# Patient Record
Sex: Female | Born: 1950
Health system: Southern US, Community
[De-identification: ages and names within clinical notes are randomized; demographics above are authoritative.]

## PROBLEM LIST (undated history)

## (undated) DIAGNOSIS — Z5189 Encounter for other specified aftercare: Secondary | ICD-10-CM

## (undated) DIAGNOSIS — I10 Essential (primary) hypertension: Secondary | ICD-10-CM

## (undated) DIAGNOSIS — E785 Hyperlipidemia, unspecified: Secondary | ICD-10-CM

## (undated) DIAGNOSIS — K219 Gastro-esophageal reflux disease without esophagitis: Secondary | ICD-10-CM

## (undated) DIAGNOSIS — T7840XA Allergy, unspecified, initial encounter: Secondary | ICD-10-CM

## (undated) DIAGNOSIS — D649 Anemia, unspecified: Secondary | ICD-10-CM

## (undated) DIAGNOSIS — M8580 Other specified disorders of bone density and structure, unspecified site: Secondary | ICD-10-CM

## (undated) HISTORY — DX: Other specified disorders of bone density and structure, unspecified site: M85.80

## (undated) HISTORY — DX: Essential (primary) hypertension: I10

## (undated) HISTORY — DX: Gastro-esophageal reflux disease without esophagitis: K21.9

## (undated) HISTORY — DX: Encounter for other specified aftercare: Z51.89

## (undated) HISTORY — DX: Allergy, unspecified, initial encounter: T78.40XA

## (undated) HISTORY — DX: Anemia, unspecified: D64.9

## (undated) HISTORY — DX: Hyperlipidemia, unspecified: E78.5

---

## 1982-10-24 DIAGNOSIS — Z5189 Encounter for other specified aftercare: Secondary | ICD-10-CM

## 1982-10-24 HISTORY — DX: Encounter for other specified aftercare: Z51.89

## 1994-10-24 HISTORY — PX: PELVIC LAPAROSCOPY: SHX162

## 1998-05-07 ENCOUNTER — Ambulatory Visit (HOSPITAL_COMMUNITY): Admission: RE | Admit: 1998-05-07 | Discharge: 1998-05-07 | Payer: Self-pay | Admitting: *Deleted

## 1998-05-12 ENCOUNTER — Ambulatory Visit (HOSPITAL_COMMUNITY): Admission: RE | Admit: 1998-05-12 | Discharge: 1998-05-12 | Payer: Self-pay | Admitting: *Deleted

## 1998-06-05 ENCOUNTER — Ambulatory Visit (HOSPITAL_BASED_OUTPATIENT_CLINIC_OR_DEPARTMENT_OTHER): Admission: RE | Admit: 1998-06-05 | Discharge: 1998-06-05 | Payer: Self-pay | Admitting: General Surgery

## 1998-10-24 HISTORY — PX: HERNIA REPAIR: SHX51

## 1999-05-12 ENCOUNTER — Ambulatory Visit (HOSPITAL_COMMUNITY): Admission: RE | Admit: 1999-05-12 | Discharge: 1999-05-12 | Payer: Self-pay | Admitting: *Deleted

## 1999-05-12 ENCOUNTER — Encounter: Payer: Self-pay | Admitting: *Deleted

## 2000-06-09 ENCOUNTER — Encounter (INDEPENDENT_AMBULATORY_CARE_PROVIDER_SITE_OTHER): Payer: Self-pay | Admitting: Specialist

## 2000-06-09 ENCOUNTER — Other Ambulatory Visit: Admission: RE | Admit: 2000-06-09 | Discharge: 2000-06-09 | Payer: Self-pay | Admitting: *Deleted

## 2000-06-21 ENCOUNTER — Other Ambulatory Visit: Admission: RE | Admit: 2000-06-21 | Discharge: 2000-06-21 | Payer: Self-pay | Admitting: *Deleted

## 2001-06-18 ENCOUNTER — Other Ambulatory Visit: Admission: RE | Admit: 2001-06-18 | Discharge: 2001-06-18 | Payer: Self-pay | Admitting: *Deleted

## 2001-10-24 HISTORY — PX: SUPRACERVICAL ABDOMINAL HYSTERECTOMY: SHX5393

## 2002-10-24 HISTORY — PX: SUPRACERVICAL ABDOMINAL HYSTERECTOMY: SHX5393

## 2002-10-28 ENCOUNTER — Other Ambulatory Visit: Admission: RE | Admit: 2002-10-28 | Discharge: 2002-10-28 | Payer: Self-pay | Admitting: *Deleted

## 2002-11-19 ENCOUNTER — Encounter (INDEPENDENT_AMBULATORY_CARE_PROVIDER_SITE_OTHER): Payer: Self-pay

## 2002-11-19 ENCOUNTER — Observation Stay (HOSPITAL_COMMUNITY): Admission: RE | Admit: 2002-11-19 | Discharge: 2002-11-20 | Payer: Self-pay | Admitting: *Deleted

## 2004-12-29 ENCOUNTER — Ambulatory Visit: Payer: Self-pay | Admitting: Internal Medicine

## 2005-02-01 ENCOUNTER — Ambulatory Visit: Payer: Self-pay | Admitting: Internal Medicine

## 2005-04-27 ENCOUNTER — Ambulatory Visit: Payer: Self-pay | Admitting: Internal Medicine

## 2008-06-05 ENCOUNTER — Other Ambulatory Visit: Admission: RE | Admit: 2008-06-05 | Discharge: 2008-06-05 | Payer: Self-pay | Admitting: Obstetrics and Gynecology

## 2008-06-24 ENCOUNTER — Ambulatory Visit (HOSPITAL_COMMUNITY): Admission: RE | Admit: 2008-06-24 | Discharge: 2008-06-24 | Payer: Self-pay | Admitting: Obstetrics and Gynecology

## 2008-07-16 ENCOUNTER — Ambulatory Visit: Payer: Self-pay | Admitting: Internal Medicine

## 2008-07-16 DIAGNOSIS — M545 Low back pain, unspecified: Secondary | ICD-10-CM | POA: Insufficient documentation

## 2008-07-16 DIAGNOSIS — N924 Excessive bleeding in the premenopausal period: Secondary | ICD-10-CM | POA: Insufficient documentation

## 2008-07-16 DIAGNOSIS — E785 Hyperlipidemia, unspecified: Secondary | ICD-10-CM | POA: Insufficient documentation

## 2008-07-16 DIAGNOSIS — I10 Essential (primary) hypertension: Secondary | ICD-10-CM | POA: Insufficient documentation

## 2009-05-15 ENCOUNTER — Encounter: Payer: Self-pay | Admitting: Internal Medicine

## 2009-05-15 ENCOUNTER — Ambulatory Visit: Payer: Self-pay | Admitting: Internal Medicine

## 2009-05-15 DIAGNOSIS — L980 Pyogenic granuloma: Secondary | ICD-10-CM | POA: Insufficient documentation

## 2010-05-17 ENCOUNTER — Telehealth (INDEPENDENT_AMBULATORY_CARE_PROVIDER_SITE_OTHER): Payer: Self-pay | Admitting: *Deleted

## 2010-05-18 ENCOUNTER — Ambulatory Visit: Payer: Self-pay | Admitting: Internal Medicine

## 2010-05-18 DIAGNOSIS — M25519 Pain in unspecified shoulder: Secondary | ICD-10-CM | POA: Insufficient documentation

## 2010-05-18 DIAGNOSIS — R609 Edema, unspecified: Secondary | ICD-10-CM | POA: Insufficient documentation

## 2010-11-23 NOTE — Assessment & Plan Note (Signed)
Summary: per flag/stacey sched--ov--phone--stc   Vital Signs:  Patient profile:   60 year old female Height:      60 inches Weight:      165 pounds BMI:     32.34 O2 Sat:      96 % on Room air Temp:     99.3 degrees F oral Pulse rate:   81 / minute Pulse rhythm:   regular Resp:     16 per minute BP sitting:   118 / 80  (left arm) Cuff size:   regular  Vitals Entered By: Lanier Prude, CMA(AAMA) (May 18, 2010 1:56 PM)  O2 Flow:  Room air CC: f/u  c/o Lt shoulder pain X 1 yr Is Patient Diabetic? No Comments pt is not taking Hydrocodone-Acetamin.  Needs Rf on HCTZ 12.5 mg    CC:  f/u  c/o Lt shoulder pain X 1 yr.  History of Present Illness: C/o L shoulder pain x 6 months - no injury; better now F/u fluid retention x years  Current Medications (verified): 1)  Vitamin D3 1000 Unit  Tabs (Cholecalciferol) .Marland Kitchen.. 1 By Mouth Daily 2)  Hydrochlorothiazide 12.5 Mg  Tabs (Hydrochlorothiazide) .... Take 1 Tab  By Mouth Every Morning 3)  Fish Oil  Oil (Fish Oil) .... Two Times A Day 4)  Multivitamins  Tabs (Multiple Vitamin) .... Once Daily 5)  Hydrocodone-Acetaminophen 5-325 Mg Tabs (Hydrocodone-Acetaminophen) .Marland Kitchen.. 1 By Mouth Up To 4 Times Per Day As Needed For Pain  Allergies (verified): No Known Drug Allergies  Past History:  Past Medical History: Last updated: 07/16/2008 Hyperlipidemia GYN Dr Eda Paschal Low back pain, MSK  Past Surgical History: Last updated: 07/16/2008 Hysterectomy, partial 2004  Social History: Last updated: 07/16/2008 Occupation: Physicist, medical Married Never Smoked  Review of Systems  The patient denies fever, chest pain, and syncope.    Physical Exam  General:  WNL Mouth:  WNL Lungs:  Normal respiratory effort, chest expands symmetrically. Lungs are clear to auscultation, no crackles or wheezes. Heart:  Normal rate and regular rhythm. S1 and S2 normal without gallop, murmur, click, rub or other extra sounds. Msk:  L lat scapula is a  little tender to palp Skin:  Clear Psych:  Cognition and judgment appear intact. Alert and cooperative with normal attention span and concentration.    Impression & Recommendations:  Problem # 1:  SHOULDER PAIN (ICD-719.41) L Assessment Improved  The following medications were removed from the medication list:    Hydrocodone-acetaminophen 5-325 Mg Tabs (Hydrocodone-acetaminophen) .Marland Kitchen... 1 by mouth up to 4 times per day as needed for pain  Problem # 2:  EDEMA (ICD-782.3) Assessment: New  Her updated medication list for this problem includes:    Hydrochlorothiazide 12.5 Mg Tabs (Hydrochlorothiazide) .Marland Kitchen... Take 1 tab  by mouth every morning Labs w/gyn  Problem # 3:  Preventive Health Care (ICD-V70.0) Discussed need for colon  Complete Medication List: 1)  Vitamin D3 1000 Unit Tabs (Cholecalciferol) .Marland Kitchen.. 1 by mouth daily 2)  Hydrochlorothiazide 12.5 Mg Tabs (Hydrochlorothiazide) .... Take 1 tab  by mouth every morning 3)  Fish Oil Oil (Fish oil) .... Two times a day 4)  Multivitamins Tabs (Multiple vitamin) .... Once daily  Patient Instructions: 1)  Schedule well w/labs w/your GYN doctor Prescriptions: HYDROCHLOROTHIAZIDE 12.5 MG  TABS (HYDROCHLOROTHIAZIDE) Take 1 tab  by mouth every morning  #30 Capsule x 11   Entered and Authorized by:   Tresa Garter MD   Signed by:   Georgina Quint Christine Schiefelbein  MD on 05/18/2010   Method used:   Electronically to        [facility] [id number]* (retail)       [address]       [place], [place]  [address]       Ph: [phone]       Fax: [phone]   RxID:   [id number]

## 2010-11-23 NOTE — Progress Notes (Signed)
----   Converted from flag ---- ---- 05/17/2010 10:52 AM, Lanier Prude, CMA(AAMA) wrote: Please schedule pt for OV w/PCP.    Thanks!!  Misty Stanley ------------------------------  Gave pt appt/phone--05/18/10@ 145p w/ Dr Posey Rea.

## 2010-11-23 NOTE — Assessment & Plan Note (Signed)
Summary: RE ESTABLISH PER MD-$50-[employer]-PKG/OFF--STC   Vital Signs:  Patient Profile:   60 Years Old Female Weight:      170 pounds Temp:     97.5 degrees F oral Pulse rate:   82 / minute BP sitting:   154 / 106  (left arm)  Vitals Entered By: Tora Perches (July 16, 2008 9:38 AM)             Is Patient Diabetic? No     Chief Complaint:  to re-est..  History of Present Illness: Not seen >3 y. C/o elev. chol 236, HDL 44, LDL 158 in 8/09.    Current Allergies: No known allergies   Past Medical History:    Hyperlipidemia    GYN Dr Eda Paschal    Low back pain, MSK  Past Surgical History:    Hysterectomy, partial 2004   Family History:    M - cancer in neck    F CVA  Social History:    Occupation: Physicist, medical    Married    Never Smoked   Risk Factors:  Tobacco use:  never   Review of Systems       The patient complains of weight gain.  The patient denies anorexia, fever, weight loss, vision loss, decreased hearing, hoarseness, chest pain, syncope, dyspnea on exertion, peripheral edema, prolonged cough, headaches, hemoptysis, abdominal pain, melena, hematochezia, severe indigestion/heartburn, hematuria, incontinence, genital sores, muscle weakness, suspicious skin lesions, transient blindness, difficulty walking, depression, unusual weight change, abnormal bleeding, enlarged lymph nodes, angioedema, and breast masses.     Physical Exam  General:     overweight-appearing.   Head:     Normocephalic and atraumatic without obvious abnormalities. No apparent alopecia or balding. Eyes:     No corneal or conjunctival inflammation noted. EOMI. Perrla. Funduscopic exam benign, without hemorrhages, exudates or papilledema. Vision grossly normal. Ears:     External ear exam shows no significant lesions or deformities.  Otoscopic examination reveals clear canals, tympanic membranes are intact bilaterally without bulging, retraction, inflammation or discharge.  Hearing is grossly normal bilaterally. Nose:     External nasal examination shows no deformity or inflammation. Nasal mucosa are pink and moist without lesions or exudates. Mouth:     Oral mucosa and oropharynx without lesions or exudates.  Teeth in good repair. Neck:     No deformities, masses, or tenderness noted. Lungs:     Normal respiratory effort, chest expands symmetrically. Lungs are clear to auscultation, no crackles or wheezes. Heart:     Normal rate and regular rhythm. S1 and S2 normal without gallop, murmur, click, rub or other extra sounds. Abdomen:     Bowel sounds positive,abdomen soft and non-tender without masses, organomegaly or hernias noted. Msk:     No deformity or scoliosis noted of thoracic or lumbar spine.   Extremities:     No clubbing, cyanosis, edema, or deformity noted with normal full range of motion of all joints.   Neurologic:     No cranial nerve deficits noted. Station and gait are normal. Plantar reflexes are down-going bilaterally. DTRs are symmetrical throughout. Sensory, motor and coordinative functions appear intact. Skin:     Intact without suspicious lesions or rashes Psych:     Cognition and judgment appear intact. Alert and cooperative with normal attention span and concentration. No apparent delusions, illusions, hallucinations    Impression & Recommendations:  Problem # 1:  HYPERLIPIDEMIA (ICD-272.4) Assessment: New Does not to take a statin. Fish oil, wt loss.  Problem # 2:  ELEVATED BP (ICD-796.2) Assessment: New  Her updated medication list for this problem includes:    Hydrochlorothiazide 12.5 Mg Tabs (Hydrochlorothiazide) .Marland Kitchen... Take 1 tab  by mouth every morning   Problem # 3:  MENOPAUSAL SYNDROME (ICD-627.0) Assessment: Comment Only Vit D 1000 international units a day  Complete Medication List: 1)  Vitamin D3 1000 Unit Tabs (Cholecalciferol) .Marland Kitchen.. 1 by mouth daily 2)  Hydrochlorothiazide 12.5 Mg Tabs (Hydrochlorothiazide)  .... Take 1 tab  by mouth every morning  Other Orders: Admin 1st Vaccine (16109) Flu Vaccine 31yrs + (60454)   Patient Instructions: 1)  Please schedule a follow-up appointment in 6 months. 2)  BMP prior to visit, ICD-9: 3)  Hepatic Panel prior to visit, ICD-9: 272.0 4)  Lipid Panel prior to visit, ICD-9: 5)  TSH prior to visit, ICD-9:   Prescriptions: HYDROCHLOROTHIAZIDE 12.5 MG  TABS (HYDROCHLOROTHIAZIDE) Take 1 tab  by mouth every morning  #30 x 12   Entered and Authorized by:   Tresa Garter MD   Signed by:   Tresa Garter MD on 07/16/2008   Method used:   Print then Give to Patient   RxID:   [id number]  ]    Flu Vaccine Consent Questions     Do you have a history of severe allergic reactions to this vaccine? no    Any prior history of allergic reactions to egg and/or gelatin? no    Do you have a sensitivity to the preservative Thimersol? no    Do you have a past history of Guillan-Barre Syndrome? no    Do you currently have an acute febrile illness? no    Have you ever had a severe reaction to latex? no    Vaccine information given and explained to patient? yes    Are you currently pregnant? no    Lot Number:[id number]   Site Given  Left Deltoid IMflu

## 2010-11-23 NOTE — Assessment & Plan Note (Signed)
Summary: RT 2ND TOE LESION/OK PER PLOT/JSS   Vital Signs:  Patient profile:   60 year old female Height:      60 inches Weight:      160 pounds BMI:     31.36 Temp:     98.6 degrees F oral Pulse rate:   86 / minute BP sitting:   120 / 80  (left arm)  Vitals Entered By: Tora Perches (May 15, 2009 4:33 PM)  Procedure Note  Biopsy: Biopsy Type: Skin The patient complains of suspicious lesion and discharge. Indication: inflamed lesion Consent signed: yes  Procedure # 1: punch biopsy    Size (in cm): 0.5 x 0.5    Region: palmar    Location: R 2nd toe    Comment: Granulomatous lesion was removed after a tourniquet was applied. Wound treated w/hyfercator    Instrument used: 6mm punch    Anesthesia: 1% lidocaine w/o epinephrine  Cleaned and prepped with: alcohol and betadine Wound dressing: bacitracin, bandaid, bulky gauze dressing, and pressure dressing Instructions: daily dressing changes  CC: problems with toe/vg Is Patient Diabetic? No   CC:  problems with toe/vg.  History of Present Illness: C/o R 2nd toe bleeding lesion x 1-2 wks worse today. Walked in crying  Allergies (verified): No Known Drug Allergies  Past History:  Past Medical History: Last updated: 07/16/2008 Hyperlipidemia GYN Dr Eda Paschal Low back pain, MSK  Past Surgical History: Last updated: 07/16/2008 Hysterectomy, partial 2004  Physical Exam  General:  Anxious  Skin:  R second toe frail and fleshy bleeding 5 mm lesion   Impression & Recommendations:  Problem # 1:  GRANULOMA, INFECTIOUS (ICD-686.1) Assessment New  Removed  Orders: Shave Skin Lesion <0.5cm Scalp/neck/hands/feet/genitalia (47829)  Complete Medication List: 1)  Vitamin D3 1000 Unit Tabs (Cholecalciferol) .Marland Kitchen.. 1 by mouth daily 2)  Hydrochlorothiazide 12.5 Mg Tabs (Hydrochlorothiazide) .... Take 1 tab  by mouth every morning 3)  Fish Oil Oil (Fish oil) .... Two times a day 4)  Multivitamins Tabs (Multiple  vitamin) .... Once daily 5)  Hydrocodone-acetaminophen 5-325 Mg Tabs (Hydrocodone-acetaminophen) .Marland Kitchen.. 1 by mouth up to 4 times per day as needed for pain  Patient Instructions: 1)  Call if you are not better in a reasonable amount of time or if worse. Go to ER if feeling really bad! 2)  Change dressing daily Prescriptions: HYDROCODONE-ACETAMINOPHEN 5-325 MG TABS (HYDROCODONE-ACETAMINOPHEN) 1 by mouth up to 4 times per day as needed for pain  #20 x 0   Entered and Authorized by:   Tresa Garter MD   Signed by:   Tresa Garter MD on 05/15/2009   Method used:   Print then Give to Patient   RxID:   [id number]

## 2010-11-23 NOTE — Miscellaneous (Signed)
Summary: 2nd RT Toe/[facility]  2nd RT Toe/[facility]   Imported By: Sherian Rein 05/19/2009 08:07:27  _____________________________________________________________________  External Attachment:    Type:   Image     Comment:   External Document

## 2011-03-11 NOTE — Op Note (Signed)
Regina Roberts, Regina Roberts                      ACCOUNT NO.:  [id number]   MEDICAL RECORD NO.:  [id number]                   PATIENT TYPE:  OBV   LOCATION:  0445                                 FACILITY:  [facility]   PHYSICIAN:  Almedia Balls. Fore, M.D.                DATE OF BIRTH:  03/17/1951   DATE OF PROCEDURE:  11/19/2002  DATE OF DISCHARGE:                                 OPERATIVE REPORT   PREOPERATIVE DIAGNOSES:  1. Abnormal uterine bleeding.  2. Uterine enlargement.  3. Pelvic pain.   POSTOPERATIVE DIAGNOSES:  1. Abnormal uterine bleeding.  2. Uterine enlargement.  3. Pelvic pain.  4. Right ovarian cyst.   Pending pathology.   PROCEDURE:  Abdominal supracervical hysterectomy, right salpingo-  oophorectomy.   ANESTHESIA:  General orotracheal.   SURGEON:  Almedia Balls. Randell Patient, M.D.   ASSISTANT:  Leona Singleton, M.D.   INDICATION FOR PROCEDURE:  The patient is a 60 year old with the above-noted  problems who has been counseled as to the need for surgery to treat these  problems.  She has been fully counseled as to the nature of the procedure  and the risks involved, including risks of anesthesia, injury to bowel,  bladder, blood vessels, ureters, postoperative hemorrhage, infection,  recuperation, possibility of hormone replacement should her ovaries be  removed.  She fully understands all these considerations and wishes to  proceed on 11/19/02.   OPERATIVE FINDINGS:  On entry into the abdomen, the uterus was enlarged to  approximately 16-18 weeks' gestational size.  It was incarcerated in the  greater curvature of the pelvis so that elevation of the uterus was quite  difficulty.  The right ovary had a cyst, which was hemorrhagic.  The left  ovary appeared to be normal.  Upper abdominal viscera were normal to  palpation.   DESCRIPTION OF PROCEDURE:  With the patient under general anesthesia,  prepared and draped in the usual sterile fashion with a Foley catheter in  the  bladder, a lower abdominal transverse incision was made and carried into  the peritoneal cavity without difficulty.  A self-retaining retractor was  placed, and the bowel was packed off.  Elevation of the uterus was difficult  because of the incarcerated nature of the uterus into the posterior cul-de-  sac and greater curvature of the pelvis.  However, this was accomplished so  that Kelly clamps could be placed across the uterine-ovarian anastomoses,  tubes, and round ligaments bilaterally for traction and hemostasis.  The  round ligaments were transected using Bovie electrocoagulation, and the  retroperitoneal space was entered as well as the bladder flap anteriorly was  developed.  Because of the cystic nature of the right ovary and the  inability to control hemorrhage in this area, the infundibulopelvic ligament  was clamped on the right with removal of the right tube and ovary.  This  structure was then cut and doubly ligated with  1 chromic catgut.  On the  left the left ovary was conserved by clamping proximal to the ovary and  cutting down with the uterine-ovarian anastomosis being doubly tied with 1  chromic catgut.  The bladder was further advanced along the lower uterine  segment and cervix, and the uterine vessels were skeletonized.  Heaney  clamps were used to clamp the uterine vessels bilaterally, which were then  cut and suture ligated with 1 chromic catgut.  Heaney clamps were also used  to clamp the remaining portions of the cardinal ligaments bilaterally, which  were likewise cut and suture ligated with 1 chromic catgut.  The uterine  fundus was excised using bipolar electrocoagulation with the endocervical  stump being reversely coned so that the endocervix was removed for the most  part.  Small bleeders on the cervix were rendered hemostatic with Bovie  electrocoagulation, and the cervix was reapproximated and rendered  hemostatic with interrupted figure-of-eight sutures of  #1 chromic catgut.  The area was lavaged with copious amounts of lactated Ringer's solution and  after noting that hemostasis was maintained and that sponge and instrument  counts were correct, the peritoneum was closed with a continuous suture of 0  Vicryl.  The fascia was closed with two sutures of 0 Vicryl, which were  brought from the lateral aspects of the incision and tied separately in the  midline.  Subcutaneous fat was reapproximated with interrupted sutures of 0  Vicryl.  Skin was closed with a subcuticular suture of 3-0 plain catgut.  Estimated blood loss 300 mL.  The patient was taken to the recovery room in  good condition with clear urine in the Foley catheter tubing.  She will be  placed on 23-hour observation following surgery.                                                Almedia Balls. Randell Patient, M.D.    SRF/MEDQ  D:  11/19/2002  T:  11/19/2002  Job:  [id number]   cc:   Leona Singleton, M.D.  [address]  [place]  [place] [address]  Fax: [phone]

## 2011-03-11 NOTE — Discharge Summary (Signed)
   NAMEEVOLEHT, HOVATTER                      ACCOUNT NO.:  [id number]   MEDICAL RECORD NO.:  [id number]                   PATIENT TYPE:  OBV   LOCATION:  0445                                 FACILITY:  [facility]   PHYSICIAN:  Almedia Balls. Fore, M.D.                DATE OF BIRTH:  1951-01-08   DATE OF ADMISSION:  11/19/2002  DATE OF DISCHARGE:  11/20/2002                                 DISCHARGE SUMMARY   HISTORY:  The patient is a 60 year old with uterine enlargement, abnormal  uterine bleeding, pelvic pain, for abdominal hysterectomy and possible  bilateral salpingo-oophorectomy.  The remainder of her history and physical  are as previously dictated.   LABORATORY DATA:  Preoperative hemoglobin 12.5, electrocardiogram showed  normal sinus rhythm with left ventricular hypertrophy and right bundle  branch block.   HOSPITAL COURSE:  The patient was taken to the operating room on 11/19/02, at  which time abdominal and supracervical hysterectomy and right salpingo-  oophorectomy were performed.  The patient did well postoperatively.  Diet  and ambulation progressed over the evening of 11/19/02 and the early morning  of 11/20/02.  On the morning of 11/20/02, she was afebrile and experiencing no  problems, except for pain, which was controlled by analgesics.  It was felt  that she could be discharged at this time.   FINAL DIAGNOSES:  1. Uterine enlargement.  2. Abnormal uterine bleeding.  3. History of anemia secondary to above.   OPERATIONS:  Abdominal supracervical hysterectomy and right salpingo-  oophorectomy.  Pathology report unavailable at the time of dictation.   DISPOSITION:  Discharged to home to return to the office in two weeks for  followup.  She was instructed to gradually progress her activities over  several weeks at home, and to limit lifting and driving for two weeks.  She  was fully ambulatory, on a regular diet, and in good discharge at the time  of discharge.  She was  given prescriptions for Dilaudid 2 mg #30 to be taken  1-2 q.4h. p.r.n. pain, and Doxycycline 100 mg #12 to be taken 1 b.i.d.                                                Almedia Balls. Randell Patient, M.D.    SRF/MEDQ  D:  11/20/2002  T:  11/20/2002  Job:  [id number]

## 2011-03-11 NOTE — H&P (Signed)
NAME:  Regina Roberts, Regina Roberts                      ACCOUNT NO.:  [id number]   MEDICAL RECORD NO.:  [id number]                   PATIENT TYPE:   LOCATION:                                       FACILITY:  [name]   PHYSICIAN:  Almedia Balls. Fore, M.D.                DATE OF BIRTH:  14-Nov-1950   DATE OF ADMISSION:  11/19/2002  DATE OF DISCHARGE:                                HISTORY & PHYSICAL   For admission November 19, 2002.   CHIEF COMPLAINT:  Uterine enlargements, abnormal bleeding.   HISTORY OF PRESENT ILLNESS:  The patient is a 60 year old whose last  menstrual period was October 28, 2002.  She has been followed in our office  over several years with gradual increase in uterine size to approximately [redacted]  weeks gestational size.  She has had increasingly heavy menses and has had  hemoglobins as low as approximately 10 grams.  She underwent ultrasound and  endometrial biopsy in October 25, 2002, and this was read as totally benign  endometrium.  Hysteroscopy D&C done several years ago revealed several  probably submucous myomata and the uterus at approximately [redacted] weeks  gestational size.  On an exam on October 28, 2002, the uterus had enlarged to  approximately [redacted] weeks gestational size with marked irregularity.  She is  admitted at this time for transabdominal hysterectomy, possible bilateral  salpingo-oophorectomy, and indicated procedures.  She has been fully  counseled as to the nature of the procedure and the risks involved including  risks of anesthesia; injury to bowel, bladder, blood vessels, ureters;  postoperative hemorrhage; infection; recuperation; the possibility of  hormone replacement should her ovaries be removed.  She fully understands  all these considerations and wishes to proceed on November 19, 2002.   PAST MEDICAL HISTORY:  Includes the above-noted D&C and endometrial biopsy,  a laparoscopy done in 1996 with finding of benign ovarian cyst and probable  endometriosis with  again the uterus somewhat enlarged at that time.   ALLERGIES:  She is allergic to no medications.   MEDICATIONS:  Has taken only iron and vitamins at this point and analgesics  for pain with her menses.   REVIEW OF SYSTEMS:  HEENT:  Negative.  CARDIORESPIRATORY:  Negative.  GASTROINTESTINAL:  Negative.  GENITOURINARY:  As in Present Illness.   FAMILY HISTORY:  Includes a maternal aunt with cancer of the breast.   PHYSICAL EXAMINATION:  VITAL SIGNS:  Height 5 feet 1/2 inch.  Weight 172  pounds.  Blood pressure 124/80, pulse 72, respirations 18.  GENERAL:  A well-developed black female in no acute distress.  HEENT:  Within normal limits.  NECK:  Supple without masses, adenopathy, or bruit.  HEART:  Regular rate and rhythm without murmurs.  LUNGS:  Clear to P&A.  BREASTS:  Sitting and lying without mass.  Axilla negative.  ABDOMEN:  Flat and soft with a mass effect in the lower  abdomen, nontender.  PELVIC:  External genitalia, Bartholin, urethral, and Skene's glands within  normal limits.  Vagina is clean, cervix somewhat inflamed.  Uterus is  midposterior, approximately 10-[redacted] weeks gestational size.  It is not  possible to palpate adnexal structures but they appear to be normal on  ultrasound.  Anterior and posterior cul-de-sac exam was confirmatory.  EXTREMITIES:  Within normal limits.  CENTRAL NERVOUS SYSTEM:  Grossly intact.  SKIN:  Without suspicious lesion.   IMPRESSION:  1. Probable leiomyomata uteri.  2. Abnormal bleeding secondary to above.  3. Anemia secondary to above.   DISPOSITION:  As noted above.   LABORATORY DATA:  Of note is that a Pap smear was normal on October 28, 2002.                                               Almedia Balls. Randell Patient, M.D.    SRF/MEDQ  D:  11/14/2002  T:  11/14/2002  Job:  [id number]

## 2011-06-11 ENCOUNTER — Other Ambulatory Visit: Payer: Self-pay | Admitting: Internal Medicine

## 2011-09-09 ENCOUNTER — Other Ambulatory Visit: Payer: Self-pay | Admitting: Internal Medicine

## 2011-09-12 MED ORDER — HYDROCHLOROTHIAZIDE 12.5 MG PO CAPS: 12.5000 mg | ORAL_CAPSULE | ORAL | Status: DC

## 2011-09-12 NOTE — Telephone Encounter (Signed)
Addended by: Anselm Jungling on: 09/12/2011 10:54 AM   Modules accepted: Orders

## 2011-10-16 ENCOUNTER — Other Ambulatory Visit: Payer: Self-pay | Admitting: Internal Medicine

## 2011-11-17 ENCOUNTER — Other Ambulatory Visit: Payer: Self-pay | Admitting: Internal Medicine

## 2012-01-13 ENCOUNTER — Other Ambulatory Visit: Payer: Self-pay | Admitting: Internal Medicine

## 2012-03-10 ENCOUNTER — Other Ambulatory Visit: Payer: Self-pay | Admitting: Internal Medicine

## 2012-03-16 ENCOUNTER — Other Ambulatory Visit: Payer: Self-pay | Admitting: Internal Medicine

## 2012-05-07 ENCOUNTER — Other Ambulatory Visit: Payer: Self-pay | Admitting: Internal Medicine

## 2012-05-14 ENCOUNTER — Telehealth: Payer: Self-pay | Admitting: *Deleted

## 2012-05-14 NOTE — Telephone Encounter (Signed)
Message copied by Merrilyn Puma on Mon May 14, 2012  9:29 AM ------      Message from: Etheleen Sia      Created: Tue May 08, 2012  9:50 AM       I set her up on 8-14 for a physical.  She wants to do labs after seeing him.  No insurance/ bring $125.  Told her she would get refills at the appt.      ----- Message -----         From: Merrilyn Puma, CMA         Sent: 05/08/2012   7:55 AM           To: Remigio Eisenmenger, #            Please contact pt- we keep getting Rf requests and it looks like she hasn't been see in years.             Thanks!!

## 2012-06-06 ENCOUNTER — Encounter: Payer: Self-pay | Admitting: Internal Medicine

## 2014-06-02 ENCOUNTER — Telehealth: Payer: Self-pay | Admitting: Internal Medicine

## 2014-06-02 ENCOUNTER — Ambulatory Visit (INDEPENDENT_AMBULATORY_CARE_PROVIDER_SITE_OTHER): Payer: BC Managed Care – PPO | Admitting: Internal Medicine

## 2014-06-02 ENCOUNTER — Encounter: Payer: Self-pay | Admitting: Internal Medicine

## 2014-06-02 VITALS — BP 160/100 | HR 84 | Temp 98.2°F | Resp 20 | Wt 161.0 lb

## 2014-06-02 DIAGNOSIS — Z0000 Encounter for general adult medical examination without abnormal findings: Secondary | ICD-10-CM

## 2014-06-02 DIAGNOSIS — E785 Hyperlipidemia, unspecified: Secondary | ICD-10-CM

## 2014-06-02 DIAGNOSIS — R030 Elevated blood-pressure reading, without diagnosis of hypertension: Secondary | ICD-10-CM

## 2014-06-02 LAB — CBC WITH DIFFERENTIAL/PLATELET
Basophils Absolute: 0 10*3/uL (ref 0.0–0.1)
Basophils Relative: 0.4 % (ref 0.0–3.0)
Eosinophils Absolute: 0.1 10*3/uL (ref 0.0–0.7)
Eosinophils Relative: 0.6 % (ref 0.0–5.0)
HCT: 41.5 % (ref 36.0–46.0)
Hemoglobin: 13.8 g/dL (ref 12.0–15.0)
Lymphocytes Relative: 23.7 % (ref 12.0–46.0)
Lymphs Abs: 3.1 10*3/uL (ref 0.7–4.0)
MCHC: 33.2 g/dL (ref 30.0–36.0)
MCV: 88.5 fl (ref 78.0–100.0)
Monocytes Absolute: 1 10*3/uL (ref 0.1–1.0)
Monocytes Relative: 7.6 % (ref 3.0–12.0)
Neutro Abs: 8.9 10*3/uL — ABNORMAL HIGH (ref 1.4–7.7)
Neutrophils Relative %: 67.7 % (ref 43.0–77.0)
Platelets: 347 10*3/uL (ref 150.0–400.0)
RBC: 4.69 Mil/uL (ref 3.87–5.11)
RDW: 14.2 % (ref 11.5–15.5)
WBC: 13.1 10*3/uL — ABNORMAL HIGH (ref 4.0–10.5)

## 2014-06-02 LAB — COMPREHENSIVE METABOLIC PANEL
ALT: 15 U/L (ref 0–35)
AST: 14 U/L (ref 0–37)
Albumin: 4.1 g/dL (ref 3.5–5.2)
Alkaline Phosphatase: 56 U/L (ref 39–117)
BUN: 14 mg/dL (ref 6–23)
CO2: 28 mEq/L (ref 19–32)
Calcium: 10.2 mg/dL (ref 8.4–10.5)
Chloride: 105 mEq/L (ref 96–112)
Creatinine, Ser: 0.8 mg/dL (ref 0.4–1.2)
GFR: 89.51 mL/min (ref >60.00)
Glucose, Bld: 86 mg/dL (ref 70–99)
Potassium: 3.8 mEq/L (ref 3.5–5.1)
Sodium: 139 mEq/L (ref 135–145)
Total Bilirubin: 0.6 mg/dL (ref 0.2–1.2)
Total Protein: 7.7 g/dL (ref 6.0–8.3)

## 2014-06-02 LAB — LIPID PANEL
Cholesterol: 257 mg/dL — ABNORMAL HIGH (ref 0–200)
HDL: 42.9 mg/dL (ref >39.00)
LDL Cholesterol: 182 mg/dL — ABNORMAL HIGH (ref 0–99)
NonHDL: 214.1
Total CHOL/HDL Ratio: 6
Triglycerides: 161 mg/dL — ABNORMAL HIGH (ref 0.0–149.0)
VLDL: 32.2 mg/dL (ref 0.0–40.0)

## 2014-06-02 LAB — TSH: TSH: 0.44 u[IU]/mL (ref 0.35–4.50)

## 2014-06-02 MED ORDER — LISINOPRIL-HYDROCHLOROTHIAZIDE 20-25 MG PO TABS: 1.0000 | ORAL_TABLET | Freq: Every day | ORAL | Status: DC

## 2014-06-02 NOTE — Progress Notes (Signed)
Subjective:    Patient ID: Regina Roberts, female    DOB: 08/24/1951, 63 y.o.   MRN: [id number]  HPI  BP Readings from Last 3 Encounters:  06/02/14 160/100  05/18/10 118/80  05/15/09 61/75   63 year old patient who has remote history of hypertension that has been controlled off medications for some time.  She has not seen her primary care provider and a number of years.  Formerly she has been controlled on diuretic therapy. 3 days ago.  She checked her blood pressure at a local drugstore with a reading of 196 over 116.  She does monitor blood pressure readings occasionally with normal tensive readings.  She is asymptomatic.  Denies any headache, visual disturbances, chest pain.  History reviewed. No pertinent past medical history.  History   Social History  . Marital Status: Married    Spouse Name: N/A    Number of Children: N/A  . Years of Education: N/A   Occupational History  . Not on file.   Social History Main Topics  . Smoking status: Never Smoker   . Smokeless tobacco: Never Used  . Alcohol Use: No  . Drug Use: No  . Sexual Activity: Not on file   Other Topics Concern  . Not on file   Social History Narrative  . No narrative on file    History reviewed. No pertinent past surgical history.  No family history on file.  No Known Allergies  No current outpatient prescriptions on file prior to visit.   No current facility-administered medications on file prior to visit.    BP 160/100  Pulse 84  Temp(Src) 98.2 F (36.8 C) (Oral)  Resp 20  Wt 161 lb (73.029 kg)  SpO2 97%    Review of Systems  Constitutional: Negative.   HENT: Negative for congestion, dental problem, hearing loss, rhinorrhea, sinus pressure, sore throat and tinnitus.   Eyes: Negative for pain, discharge and visual disturbance.  Respiratory: Negative for cough and shortness of breath.   Cardiovascular: Negative for chest pain, palpitations and leg swelling.  Gastrointestinal:  Negative for nausea, vomiting, abdominal pain, diarrhea, constipation, blood in stool and abdominal distention.  Genitourinary: Negative for dysuria, urgency, frequency, hematuria, flank pain, vaginal bleeding, vaginal discharge, difficulty urinating, vaginal pain and pelvic pain.  Musculoskeletal: Negative for arthralgias, gait problem and joint swelling.  Skin: Negative for rash.  Neurological: Negative for dizziness, syncope, speech difficulty, weakness, numbness and headaches.  Hematological: Negative for adenopathy.  Psychiatric/Behavioral: Negative for behavioral problems, dysphoric mood and agitation. The patient is not nervous/anxious.        Objective:   Physical Exam  Constitutional: She is oriented to person, place, and time. She appears well-developed and well-nourished.  Blood pressure 180 over 112  HENT:  Head: Normocephalic.  Right Ear: External ear normal.  Left Ear: External ear normal.  Mouth/Throat: Oropharynx is clear and moist.  Eyes: Conjunctivae and EOM are normal. Pupils are equal, round, and reactive to light.  Neck: Normal range of motion. Neck supple. No thyromegaly present.  Cardiovascular: Normal rate, regular rhythm, normal heart sounds and intact distal pulses.   Pulmonary/Chest: Effort normal and breath sounds normal.  Abdominal: Soft. Bowel sounds are normal. She exhibits no mass. There is no tenderness.  Musculoskeletal: Normal range of motion.  Lymphadenopathy:    She has no cervical adenopathy.  Neurological: She is alert and oriented to person, place, and time.  Skin: Skin is warm and dry. No rash noted.  Psychiatric: She  has a normal mood and affect. Her behavior is normal.          Assessment & Plan:   Hypertension.  Stage II.  We'll start on combination therapy We'll place on low salt diet  Recheck 2 weeks  History of dyslipidemia Schedule CPX  Check screening lab

## 2014-06-02 NOTE — Progress Notes (Signed)
Pre visit review using our clinic review tool, if applicable. No additional management support is needed unless otherwise documented below in the visit note. 

## 2014-06-02 NOTE — Patient Instructions (Signed)
Limit your sodium (Salt) intake  DASH Eating Plan DASH stands for "Dietary Approaches to Stop Hypertension." The DASH eating plan is a healthy eating plan that has been shown to reduce high blood pressure (hypertension). Additional health benefits may include reducing the risk of type 2 diabetes mellitus, heart disease, and stroke. The DASH eating plan may also help with weight loss. WHAT DO I NEED TO KNOW ABOUT THE DASH EATING PLAN? For the DASH eating plan, you will follow these general guidelines:  Choose foods with a percent daily value for sodium of less than 5% (as listed on the food label).  Use salt-free seasonings or herbs instead of table salt or sea salt.  Check with your health care provider or pharmacist before using salt substitutes.  Eat lower-sodium products, often labeled as "lower sodium" or "no salt added."  Eat fresh foods.  Eat more vegetables, fruits, and low-fat dairy products.  Choose whole grains. Look for the word "whole" as the first word in the ingredient list.  Choose fish and skinless chicken or [place] more often than red meat. Limit fish, poultry, and meat to 6 oz (170 g) each day.  Limit sweets, desserts, sugars, and sugary drinks.  Choose heart-healthy fats.  Limit cheese to 1 oz (28 g) per day.  Eat more home-cooked food and less restaurant, buffet, and fast food.  Limit fried foods.  Cook foods using methods other than frying.  Limit canned vegetables. If you do use them, rinse them well to decrease the sodium.  When eating at a restaurant, ask that your food be prepared with less salt, or no salt if possible. WHAT FOODS CAN I EAT? Seek help from a dietitian for individual calorie needs. Grains Whole grain or whole wheat bread. Brown rice. Whole grain or whole wheat pasta. Quinoa, bulgur, and whole grain cereals. Low-sodium cereals. Corn or whole wheat flour tortillas. Whole grain cornbread. Whole grain crackers. Low-sodium  crackers. Vegetables Fresh or frozen vegetables (raw, steamed, roasted, or grilled). Low-sodium or reduced-sodium tomato and vegetable juices. Low-sodium or reduced-sodium tomato sauce and paste. Low-sodium or reduced-sodium canned vegetables.  Fruits All fresh, canned (in natural juice), or frozen fruits. Meat and Other Protein Products Ground beef (85% or leaner), grass-fed beef, or beef trimmed of fat. Skinless chicken or [place]. Ground chicken or [place]. Pork trimmed of fat. All fish and seafood. Eggs. Dried beans, peas, or lentils. Unsalted nuts and seeds. Unsalted canned beans. Dairy Low-fat dairy products, such as skim or 1% milk, 2% or reduced-fat cheeses, low-fat ricotta or cottage cheese, or plain low-fat yogurt. Low-sodium or reduced-sodium cheeses. Fats and Oils Tub margarines without trans fats. Light or reduced-fat mayonnaise and salad dressings (reduced sodium). Avocado. Safflower, olive, or canola oils. Natural peanut or almond butter. Other Unsalted popcorn and pretzels. The items listed above may not be a complete list of recommended foods or beverages. Contact your dietitian for more options. WHAT FOODS ARE NOT RECOMMENDED? Grains White bread. White pasta. White rice. Refined cornbread. Bagels and croissants. Crackers that contain trans fat. Vegetables Creamed or fried vegetables. Vegetables in a cheese sauce. Regular canned vegetables. Regular canned tomato sauce and paste. Regular tomato and vegetable juices. Fruits Dried fruits. Canned fruit in light or heavy syrup. Fruit juice. Meat and Other Protein Products Fatty cuts of meat. Ribs, chicken wings, bacon, sausage, bologna, salami, chitterlings, fatback, hot dogs, bratwurst, and packaged luncheon meats. Salted nuts and seeds. Canned beans with salt. Dairy Whole or 2% milk, cream, half-and-half, and cream  cheese. Whole-fat or sweetened yogurt. Full-fat cheeses or blue cheese. Nondairy creamers and whipped toppings.  Processed cheese, cheese spreads, or cheese curds. Condiments Onion and garlic salt, seasoned salt, table salt, and sea salt. Canned and packaged gravies. Worcestershire sauce. Tartar sauce. Barbecue sauce. Teriyaki sauce. Soy sauce, including reduced sodium. Steak sauce. Fish sauce. Oyster sauce. Cocktail sauce. Horseradish. Ketchup and mustard. Meat flavorings and tenderizers. Bouillon cubes. Hot sauce. Tabasco sauce. Marinades. Taco seasonings. Relishes. Fats and Oils Butter, stick margarine, lard, shortening, ghee, and bacon fat. Coconut, palm kernel, or palm oils. Regular salad dressings. Other Pickles and olives. Salted popcorn and pretzels. The items listed above may not be a complete list of foods and beverages to avoid. Contact your dietitian for more information. WHERE CAN I FIND MORE INFORMATION? National Heart, Lung, and Blood Institute: travelstabloid.com Document Released: 09/29/2011 Document Revised: 02/24/2014 Document Reviewed: 08/14/2013 [facility] Patient Information 2015 [place], [place]. This information is not intended to replace advice given to you by your health care provider. Make sure you discuss any questions you have with your health care provider. Hypertension Hypertension, commonly called high blood pressure, is when the force of blood pumping through your arteries is too strong. Your arteries are the blood vessels that carry blood from your heart throughout your body. A blood pressure reading consists of a higher number over a lower number, such as 110/72. The higher number (systolic) is the pressure inside your arteries when your heart pumps. The lower number (diastolic) is the pressure inside your arteries when your heart relaxes. Ideally you want your blood pressure below 120/80. Hypertension forces your heart to work harder to pump blood. Your arteries may become narrow or stiff. Having hypertension puts you at risk for heart disease,  stroke, and other problems.  RISK FACTORS Some risk factors for high blood pressure are controllable. Others are not.  Risk factors you cannot control include:   Race. You may be at higher risk if you are African American.  Age. Risk increases with age.  Gender. Men are at higher risk than women before age 46 years. After age 48, women are at higher risk than men. Risk factors you can control include:  Not getting enough exercise or physical activity.  Being overweight.  Getting too much fat, sugar, calories, or salt in your diet.  Drinking too much alcohol. SIGNS AND SYMPTOMS Hypertension does not usually cause signs or symptoms. Extremely high blood pressure (hypertensive crisis) may cause headache, anxiety, shortness of breath, and nosebleed. DIAGNOSIS  To check if you have hypertension, your health care provider will measure your blood pressure while you are seated, with your arm held at the level of your heart. It should be measured at least twice using the same arm. Certain conditions can cause a difference in blood pressure between your right and left arms. A blood pressure reading that is higher than normal on one occasion does not mean that you need treatment. If one blood pressure reading is high, ask your health care provider about having it checked again. TREATMENT  Treating high blood pressure includes making lifestyle changes and possibly taking medicine. Living a healthy lifestyle can help lower high blood pressure. You may need to change some of your habits. Lifestyle changes may include:  Following the DASH diet. This diet is high in fruits, vegetables, and whole grains. It is low in salt, red meat, and added sugars.  Getting at least 2 hours of brisk physical activity every week.  Losing weight if  necessary.  Not smoking.  Limiting alcoholic beverages.  Learning ways to reduce stress. If lifestyle changes are not enough to get your blood pressure under  control, your health care provider may prescribe medicine. You may need to take more than one. Work closely with your health care provider to understand the risks and benefits. HOME CARE INSTRUCTIONS  Have your blood pressure rechecked as directed by your health care provider.   Take medicines only as directed by your health care provider. Follow the directions carefully. Blood pressure medicines must be taken as prescribed. The medicine does not work as well when you skip doses. Skipping doses also puts you at risk for problems.   Do not smoke.   Monitor your blood pressure at home as directed by your health care provider. SEEK MEDICAL CARE IF:   You think you are having a reaction to medicines taken.  You have recurrent headaches or feel dizzy.  You have swelling in your ankles.  You have trouble with your vision. SEEK IMMEDIATE MEDICAL CARE IF:  You develop a severe headache or confusion.  You have unusual weakness, numbness, or feel faint.  You have severe chest or abdominal pain.  You vomit repeatedly.  You have trouble breathing. MAKE SURE YOU:   Understand these instructions.  Will watch your condition.  Will get help right away if you are not doing well or get worse. Document Released: 10/10/2005 Document Revised: 02/24/2014 Document Reviewed: 08/02/2013 [name] Patient Information 2015 [place], [place]. This information is not intended to replace advice given to you by your health care provider. Make sure you discuss any questions you have with your health care provider.

## 2014-06-02 NOTE — Telephone Encounter (Signed)
Patient wanted to get in with Dr. Alain Marion b/c blood pressure that she took two days ago was 196/115.  Earliest I could get her in was Friday.  I did transfer patient over to nurse line.  Please advise if I need to do any different.  Thanks!

## 2014-06-02 NOTE — Telephone Encounter (Signed)
Pt is being work in @ [employer] for BP...Regina Roberts

## 2014-06-02 NOTE — Telephone Encounter (Signed)
Patient Information:  Caller Name: [facility]  Phone: [phone]  Patient: Regina Roberts, Regina Roberts  Gender: Female  DOB: Mar 14, 1951  Age: 62 Years  PCP: Plotnikov, Alex (Adults only)  Office Follow Up:  Does the office need to follow up with this patient?: No  Instructions For The Office: N/A   Symptoms  Reason For Call & Symptoms: Pt is calling and states that BP 196/116 on 05/31/14; did not seek any treatment for this BP and has not had it rechecked since 05/31/14;   denies any other sx;  pt does have BP cuff at home; instructed to recheck BP at present time BP 197/121 Pulse 91;  pt denies any other sx; it is noted in EPIC that pt is on HCTZ but she states that she has not taken that for a couple of years because MD could not refill it; unsure why thinks it may be because she needed an appt  Reviewed Health History In EMR: Yes  Reviewed Medications In EMR: Yes  Reviewed Allergies In EMR: Yes  Reviewed Surgeries / Procedures: Yes  Date of Onset of Symptoms: 05/31/2014  Guideline(s) Used:  High Blood Pressure  Disposition Per Guideline:   See Today in Office  Reason For Disposition Reached:   BP > 180/110  Advice Given:  Call Back If:  Headache, blurred vision, difficulty talking, or difficulty walking occurs  Chest pain or difficulty breathing occurs  You become worse.  Patient Will Follow Care Advice:  YES  Appointment Scheduled:  06/02/2014 14:45:00 Appointment Scheduled Provider:  Dr Raliegh Ip at [place] location No appt available at [facility]

## 2014-06-06 ENCOUNTER — Ambulatory Visit (INDEPENDENT_AMBULATORY_CARE_PROVIDER_SITE_OTHER): Payer: BC Managed Care – PPO | Admitting: Internal Medicine

## 2014-06-06 ENCOUNTER — Encounter: Payer: Self-pay | Admitting: Internal Medicine

## 2014-06-06 VITALS — BP 128/90 | HR 80 | Temp 99.0°F | Resp 16 | Wt 167.0 lb

## 2014-06-06 DIAGNOSIS — E785 Hyperlipidemia, unspecified: Secondary | ICD-10-CM

## 2014-06-06 DIAGNOSIS — R609 Edema, unspecified: Secondary | ICD-10-CM

## 2014-06-06 MED ORDER — TRIAMCINOLONE ACETONIDE 0.5 % EX OINT: 1.0000 "application " | TOPICAL_OINTMENT | Freq: Two times a day (BID) | CUTANEOUS | Status: DC

## 2014-06-06 NOTE — Assessment & Plan Note (Signed)
Statins suggested - she will think about it

## 2014-06-06 NOTE — Assessment & Plan Note (Signed)
Resolved

## 2014-06-06 NOTE — Progress Notes (Deleted)
Pre visit review using our clinic review tool, if applicable. No additional management support is needed unless otherwise documented below in the visit note. 

## 2014-06-06 NOTE — Progress Notes (Signed)
   Subjective:    Patient ID: Regina Roberts, female    DOB: Oct 05, 1951, 63 y.o.   MRN: [id number]  HPI  C/o HTN  BP Readings from Last 3 Encounters:  06/06/14 128/90  06/02/14 160/100  05/18/10 118/80   Wt Readings from Last 3 Encounters:  06/06/14 167 lb (75.751 kg)  06/02/14 161 lb (73.029 kg)  05/18/10 165 lb (74.844 kg)      Review of Systems  Constitutional: Negative for chills, activity change, appetite change, fatigue and unexpected weight change.  HENT: Negative for congestion, mouth sores, sinus pressure and voice change.   Eyes: Negative for visual disturbance.  Respiratory: Negative for cough and chest tightness.   Cardiovascular: Negative for palpitations and leg swelling.  Gastrointestinal: Negative for nausea, vomiting and abdominal pain.  Genitourinary: Negative for frequency, difficulty urinating and vaginal pain.  Musculoskeletal: Negative for back pain and gait problem.  Skin: Negative for pallor and rash.  Neurological: Negative for dizziness, tremors, weakness, numbness and headaches.  Psychiatric/Behavioral: Negative for confusion, sleep disturbance and dysphoric mood.       Objective:   Physical Exam  Constitutional: She appears well-developed. No distress.  HENT:  Head: Normocephalic.  Right Ear: External ear normal.  Left Ear: External ear normal.  Nose: Nose normal.  Mouth/Throat: Oropharynx is clear and moist.  Eyes: Conjunctivae are normal. Pupils are equal, round, and reactive to light. Right eye exhibits no discharge. Left eye exhibits no discharge.  Neck: Normal range of motion. Neck supple. No JVD present. No tracheal deviation present. No thyromegaly present.  Cardiovascular: Normal rate, regular rhythm and normal heart sounds.   Pulmonary/Chest: No stridor. No respiratory distress. She has no wheezes.  Abdominal: Soft. Bowel sounds are normal. She exhibits no distension and no mass. There is no tenderness. There is no rebound and  no guarding.  Musculoskeletal: She exhibits no edema and no tenderness.  Lymphadenopathy:    She has no cervical adenopathy.  Neurological: She displays normal reflexes. No cranial nerve deficit. She exhibits normal muscle tone. Coordination normal.  Skin: No rash noted. No erythema.  Psychiatric: She has a normal mood and affect. Her behavior is normal. Judgment and thought content normal.   Lab Results  Component Value Date   WBC 13.1* 06/02/2014   HGB 13.8 06/02/2014   HCT 41.5 06/02/2014   PLT 347.0 06/02/2014   GLUCOSE 86 06/02/2014   CHOL 257* 06/02/2014   TRIG 161.0* 06/02/2014   HDL 42.90 06/02/2014   LDLCALC 182* 06/02/2014   ALT 15 06/02/2014   AST 14 06/02/2014   NA 139 06/02/2014   K 3.8 06/02/2014   CL 105 06/02/2014   CREATININE 0.8 06/02/2014   BUN 14 06/02/2014   CO2 28 06/02/2014   TSH 0.44 06/02/2014          Assessment & Plan:

## 2014-06-24 DIAGNOSIS — M8580 Other specified disorders of bone density and structure, unspecified site: Secondary | ICD-10-CM

## 2014-06-24 HISTORY — DX: Other specified disorders of bone density and structure, unspecified site: M85.80

## 2014-07-03 ENCOUNTER — Ambulatory Visit (INDEPENDENT_AMBULATORY_CARE_PROVIDER_SITE_OTHER): Payer: BC Managed Care – PPO | Admitting: Gynecology

## 2014-07-03 ENCOUNTER — Encounter: Payer: Self-pay | Admitting: Gynecology

## 2014-07-03 ENCOUNTER — Other Ambulatory Visit (HOSPITAL_COMMUNITY)
Admission: RE | Admit: 2014-07-03 | Discharge: 2014-07-03 | Disposition: A | Discharge status: Home / Self Care | Payer: BC Managed Care – PPO | Source: Ambulatory Visit | Attending: Gynecology | Admitting: Gynecology

## 2014-07-03 VITALS — BP 128/82 | Ht 60.0 in | Wt 167.0 lb

## 2014-07-03 DIAGNOSIS — M899 Disorder of bone, unspecified: Secondary | ICD-10-CM

## 2014-07-03 DIAGNOSIS — M949 Disorder of cartilage, unspecified: Secondary | ICD-10-CM

## 2014-07-03 DIAGNOSIS — N952 Postmenopausal atrophic vaginitis: Secondary | ICD-10-CM

## 2014-07-03 DIAGNOSIS — Z124 Encounter for screening for malignant neoplasm of cervix: Secondary | ICD-10-CM

## 2014-07-03 DIAGNOSIS — N812 Incomplete uterovaginal prolapse: Secondary | ICD-10-CM

## 2014-07-03 DIAGNOSIS — N814 Uterovaginal prolapse, unspecified: Secondary | ICD-10-CM

## 2014-07-03 DIAGNOSIS — Z01419 Encounter for gynecological examination (general) (routine) without abnormal findings: Secondary | ICD-10-CM | POA: Diagnosis present

## 2014-07-03 DIAGNOSIS — M8580 Other specified disorders of bone density and structure, unspecified site: Secondary | ICD-10-CM

## 2014-07-03 NOTE — Patient Instructions (Signed)
Schedule colonoscopy with [facility] at [phone] or [facility] at [phone]  Call to Schedule your mammogram  Facilities in [place]: 1)  [facility], [address] Phone: [phone] 2)  [facility]. [facility], [address] Phone: [phone] 3)  Dr. Bertrand at [facility]  [address] Phone: [phone]     Mammogram A mammogram is an X-ray test to find changes in a woman's breast. You should get a mammogram if:  You are 40 years of age or older  You have risk factors.   Your doctor recommends that you have one.  BEFORE THE TEST  Do not schedule the test the week before your period, especially if your breasts are sore during this time.  On the day of your mammogram:  Wash your breasts and armpits well. After washing, do not put on any deodorant or talcum powder on until after your test.   Eat and drink as you usually do.   Take your medicines as usual.   If you are diabetic and take insulin, make sure you:   Eat before coming for your test.   Take your insulin as usual.   If you cannot keep your appointment, call before the appointment to cancel. Schedule another appointment.  TEST  You will need to undress from the waist up. You will put on a hospital gown.   Your breast will be put on the mammogram machine, and it will press firmly on your breast with a piece of plastic called a compression paddle. This will make your breast flatter so that the machine can X-ray all parts of your breast.   Both breasts will be X-rayed. Each breast will be X-rayed from above and from the side. An X-ray might need to be taken again if the picture is not good enough.   The mammogram will last about 15 to 30 minutes.  AFTER THE TEST Finding out the results of your test Ask when your test results will be ready. Make sure you get your test  results.  Document Released: 01/06/2009 Document Revised: 09/29/2011 Document Reviewed: 01/06/2009 [employer] Patient Information 2012 [name].  You may obtain a copy of any labs that were done today by logging onto MyChart as outlined in the instructions provided with your AVS (after visit summary). The office will not call with normal lab results but certainly if there are any significant abnormalities then we will contact you.   Health Maintenance, Female A healthy lifestyle and preventative care can promote health and wellness.  Maintain regular health, dental, and eye exams.  Eat a healthy diet. Foods like vegetables, fruits, whole grains, low-fat dairy products, and lean protein foods contain the nutrients you need without too many calories. Decrease your intake of foods high in solid fats, added sugars, and salt. Get information about a proper diet from your caregiver, if necessary.  Regular physical exercise is one of the most important things you can do for your health. Most adults should get at least 150 minutes of moderate-intensity exercise (any activity that increases your heart rate and causes you to sweat) each week. In addition, most adults need muscle-strengthening exercises on 2 or more days a week.   Maintain a healthy weight. The body mass index (BMI) is a screening tool to identify possible weight problems. It provides an estimate of body fat based on height and weight. Your caregiver can help determine your   BMI, and can help you achieve or maintain a healthy weight. For adults 20 years and older:  A BMI below 18.5 is considered underweight.  A BMI of 18.5 to 24.9 is normal.  A BMI of 25 to 29.9 is considered overweight.  A BMI of 30 and above is considered obese.  Maintain normal blood lipids and cholesterol by exercising and minimizing your intake of saturated fat. Eat a balanced diet with plenty of fruits and vegetables. Blood tests for lipids and cholesterol  should begin at age 35 and be repeated every 5 years. If your lipid or cholesterol levels are high, you are over 50, or you are a high risk for heart disease, you may need your cholesterol levels checked more frequently.Ongoing high lipid and cholesterol levels should be treated with medicines if diet and exercise are not effective.  If you smoke, find out from your caregiver how to quit. If you do not use tobacco, do not start.  Lung cancer screening is recommended for adults aged 44 80 years who are at high risk for developing lung cancer because of a history of smoking. Yearly low-dose computed tomography (CT) is recommended for people who have at least a 30-pack-year history of smoking and are a current smoker or have quit within the past 15 years. A pack year of smoking is smoking an average of 1 pack of cigarettes a day for 1 year (for example: 1 pack a day for 30 years or 2 packs a day for 15 years). Yearly screening should continue until the smoker has stopped smoking for at least 15 years. Yearly screening should also be stopped for people who develop a health problem that would prevent them from having lung cancer treatment.  If you are pregnant, do not drink alcohol. If you are breastfeeding, be very cautious about drinking alcohol. If you are not pregnant and choose to drink alcohol, do not exceed 1 drink per day. One drink is considered to be 12 ounces (355 mL) of beer, 5 ounces (148 mL) of wine, or 1.5 ounces (44 mL) of liquor.  Avoid use of street drugs. Do not share needles with anyone. Ask for help if you need support or instructions about stopping the use of drugs.  High blood pressure causes heart disease and increases the risk of stroke. Blood pressure should be checked at least every 1 to 2 years. Ongoing high blood pressure should be treated with medicines, if weight loss and exercise are not effective.  If you are 22 to 62 years old, ask your caregiver if you should take aspirin  to prevent strokes.  Diabetes screening involves taking a blood sample to check your fasting blood sugar level. This should be done once every 3 years, after age 35, if you are within normal weight and without risk factors for diabetes. Testing should be considered at a younger age or be carried out more frequently if you are overweight and have at least 1 risk factor for diabetes.  Breast cancer screening is essential preventative care for women. You should practice "breast self-awareness." This means understanding the normal appearance and feel of your breasts and may include breast self-examination. Any changes detected, no matter how small, should be reported to a caregiver. Women in their 15s and 30s should have a clinical breast exam (CBE) by a caregiver as part of a regular health exam every 1 to 3 years. After age 63, women should have a CBE every year. Starting at age 7, women  should consider having a mammogram (breast X-ray) every year. Women who have a family history of breast cancer should talk to their caregiver about genetic screening. Women at a high risk of breast cancer should talk to their caregiver about having an MRI and a mammogram every year.  Breast cancer gene (BRCA)-related cancer risk assessment is recommended for women who have family members with BRCA-related cancers. BRCA-related cancers include breast, ovarian, tubal, and peritoneal cancers. Having family members with these cancers may be associated with an increased risk for harmful changes (mutations) in the breast cancer genes BRCA1 and BRCA2. Results of the assessment will determine the need for genetic counseling and BRCA1 and BRCA2 testing.  The Pap test is a screening test for cervical cancer. Women should have a Pap test starting at age 14. Between ages 39 and 42, Pap tests should be repeated every 2 years. Beginning at age 57, you should have a Pap test every 3 years as long as the past 3 Pap tests have been normal. If  you had a hysterectomy for a problem that was not cancer or a condition that could lead to cancer, then you no longer need Pap tests. If you are between ages 35 and 4, and you have had normal Pap tests going back 10 years, you no longer need Pap tests. If you have had past treatment for cervical cancer or a condition that could lead to cancer, you need Pap tests and screening for cancer for at least 20 years after your treatment. If Pap tests have been discontinued, risk factors (such as a new sexual partner) need to be reassessed to determine if screening should be resumed. Some women have medical problems that increase the chance of getting cervical cancer. In these cases, your caregiver may recommend more frequent screening and Pap tests.  The human papillomavirus (HPV) test is an additional test that may be used for cervical cancer screening. The HPV test looks for the virus that can cause the cell changes on the cervix. The cells collected during the Pap test can be tested for HPV. The HPV test could be used to screen women aged 3 years and older, and should be used in women of any age who have unclear Pap test results. After the age of 66, women should have HPV testing at the same frequency as a Pap test.  Colorectal cancer can be detected and often prevented. Most routine colorectal cancer screening begins at the age of 61 and continues through age 96. However, your caregiver may recommend screening at an earlier age if you have risk factors for colon cancer. On a yearly basis, your caregiver may provide home test kits to check for hidden blood in the stool. Use of a small camera at the end of a tube, to directly examine the colon (sigmoidoscopy or colonoscopy), can detect the earliest forms of colorectal cancer. Talk to your caregiver about this at age 57, when routine screening begins. Direct examination of the colon should be repeated every 5 to 10 years through age 54, unless early forms of  pre-cancerous polyps or small growths are found.  Hepatitis C blood testing is recommended for all people born from [birth year] through 1964 and any individual with known risks for hepatitis C.  Practice safe sex. Use condoms and avoid high-risk sexual practices to reduce the spread of sexually transmitted infections (STIs). Sexually active women aged 69 and younger should be checked for Chlamydia, which is a common sexually transmitted infection. Older women  with new or multiple partners should also be tested for Chlamydia. Testing for other STIs is recommended if you are sexually active and at increased risk.  Osteoporosis is a disease in which the bones lose minerals and strength with aging. This can result in serious bone fractures. The risk of osteoporosis can be identified using a bone density scan. Women ages 80 and over and women at risk for fractures or osteoporosis should discuss screening with their caregivers. Ask your caregiver whether you should be taking a calcium supplement or vitamin D to reduce the rate of osteoporosis.  Menopause can be associated with physical symptoms and risks. Hormone replacement therapy is available to decrease symptoms and risks. You should talk to your caregiver about whether hormone replacement therapy is right for you.  Use sunscreen. Apply sunscreen liberally and repeatedly throughout the day. You should seek shade when your shadow is shorter than you. Protect yourself by wearing long sleeves, pants, a wide-brimmed hat, and sunglasses year round, whenever you are outdoors.  Notify your caregiver of new moles or changes in moles, especially if there is a change in shape or color. Also notify your caregiver if a mole is larger than the size of a pencil eraser.  Stay current with your immunizations. Document Released: 04/25/2011 Document Revised: 02/04/2013 Document Reviewed: 04/25/2011 [name] Patient Information 2014 [employer].

## 2014-07-03 NOTE — Progress Notes (Signed)
Regina Roberts [birth year] [id number]        63 y.o.  G2P0011 for annual exam.  Former patient Dr. Cherylann Banas. Has not been in since 2009.  Past medical history,surgical history, problem list, medications, allergies, family history and social history were all reviewed and documented as reviewed in the EPIC chart.  ROS:  12 system ROS performed with pertinent positives and negatives included in the history, assessment and plan.   Additional significant findings :  none   Exam: Engineer, drilling Vitals:   07/03/14 1152  BP: 128/82  Height: 5' (1.524 m)  Weight: 167 lb (75.751 kg)   General appearance:  Normal affect, orientation and appearance. Skin: Grossly normal HEENT: Without gross lesions.  No cervical or supraclavicular adenopathy. Thyroid normal.  Lungs:  Clear without wheezing, rales or rhonchi Cardiac: RR, without RMG Abdominal:  Soft, nontender, without masses, guarding, rebound, organomegaly or hernia Breasts:  Examined lying and sitting without masses, retractions, discharge or axillary adenopathy. Pelvic:  Ext/BUS/vagina with generalized atrophic changes. Cervical stump at the introitus. Without significant cystocele or rectocele.  Cervix prolapsed to the introitus. Pap done  Adnexa  Without masses or tenderness    Anus and perineum  Normal   Rectovaginal  Normal sphincter tone without palpated masses or tenderness.    Assessment/Plan:  63 y.o. G50P0011 female for annual exam.   1. Cervical stump prolapse.  Patient is status post Supracervical hysterectomy RSO 2004 by Dr. Jamal Maes due to leiomyoma and menorrhagia. The right ovary was removed due to cystic changes and apparent difficulty containing bleeding on that side. The uterus was reported at 16-18 weeks size.  It is unclear whether the supracervical was planned preoperatively noting there is no mention in the history and physical that this was the plan or whether it was decided on intraoperatively due to  difficulty freeing up the cervix were need to shorten the surgical procedure as this was not mentioned in the operative report other than the description of the supracervical portion of the procedure.  This has been present from at least 2009 Dr. Valeta Harms note and the patient has been accepting it since then. She notes occasional symptoms of pressure or something protruding. She is not having bladder or bowel issues or incontinence. She currently is sexually active without difficulty.  Options for management include continued continued observation, trial of pessary or trachelectomy discussed. I reviewed in general was involved with the trachelectomy and the anatomy of the area. The issues of bladder and bowel scarring to the upper stump leading to an increased risk of bladder or bowel damage reviewed. Patient is not interested in pursuing anything at this point but will think about her options and follow up if she chooses to do something different. 2. Postmenopausal/atrophic genital changes. Patient without significant symptoms of hot flushes, night sweats, vaginal dryness or dyspareunia. Will continue to monitor. 3. Pap smear 2004. Pap of cervical stump done today. 4. Mammography 2009. Need to schedule mammography now stressed. Benefits of early detection reviewed. SBE monthly discussed. 5. Colonoscopy never. Strongly recommended her to schedule baseline colonoscopy. Names and numbers provided. Benefits of early detection or precancerous polyp removal reviewed. Patient agrees to schedule. 6. Osteopenia.  DEXA of 05/2008 T score -1.5 FRAX 3%/0.2%. Recommended repeat DEXA now and patient agrees to schedule. Increase calcium vitamin D reviewed. 7. Health maintenance. No routine blood work done as patient reports this done through her other physician's offices. Follow up one year, sooner as needed.  Note: This document was prepared with digital dictation and possible smart phrase technology. Any  transcriptional errors that result from this process are unintentional.   Anastasio Auerbach MD, 12:50 PM 07/03/2014

## 2014-07-04 LAB — URINALYSIS W MICROSCOPIC + REFLEX CULTURE
Bacteria, UA: NONE SEEN
Bilirubin Urine: NEGATIVE
Casts: NONE SEEN
Crystals: NONE SEEN
Glucose, UA: NEGATIVE mg/dL
Hgb urine dipstick: NEGATIVE
Ketones, ur: NEGATIVE mg/dL
Nitrite: NEGATIVE
Protein, ur: NEGATIVE mg/dL
Specific Gravity, Urine: 1.022 (ref 1.005–1.030)
Urobilinogen, UA: 1 mg/dL (ref 0.0–1.0)
pH: 5.5 (ref 5.0–8.0)

## 2014-07-05 LAB — URINE CULTURE: Colony Count: 7000

## 2014-07-07 ENCOUNTER — Encounter: Payer: Self-pay | Admitting: Obstetrics and Gynecology

## 2014-07-07 LAB — CYTOLOGY - PAP

## 2014-07-17 ENCOUNTER — Ambulatory Visit (INDEPENDENT_AMBULATORY_CARE_PROVIDER_SITE_OTHER): Payer: BC Managed Care – PPO

## 2014-07-17 ENCOUNTER — Encounter: Payer: Self-pay | Admitting: Gynecology

## 2014-07-17 ENCOUNTER — Telehealth: Payer: Self-pay | Admitting: Gynecology

## 2014-07-17 ENCOUNTER — Other Ambulatory Visit: Payer: Self-pay | Admitting: Gynecology

## 2014-07-17 DIAGNOSIS — M949 Disorder of cartilage, unspecified: Secondary | ICD-10-CM

## 2014-07-17 DIAGNOSIS — Z1231 Encounter for screening mammogram for malignant neoplasm of breast: Secondary | ICD-10-CM

## 2014-07-17 DIAGNOSIS — M8580 Other specified disorders of bone density and structure, unspecified site: Secondary | ICD-10-CM

## 2014-07-17 DIAGNOSIS — M899 Disorder of bone, unspecified: Secondary | ICD-10-CM

## 2014-07-17 DIAGNOSIS — M898X9 Other specified disorders of bone, unspecified site: Secondary | ICD-10-CM

## 2014-07-17 NOTE — Telephone Encounter (Signed)
Tell patient that her bone density does show some loss and she is almost at the osteoporosis range. I would recommend checking a vitamin D level here making sure that she is getting 1200 mg of total diet calcium and increasing weightbearing exercise which will help to strengthen her bones. If she would continue to have bone loss we'll have to talk about putting her on a medication like Fosamax.

## 2014-07-18 NOTE — Telephone Encounter (Signed)
Not voicemail on home # to leave message, pt was not at work #

## 2014-07-22 ENCOUNTER — Ambulatory Visit (HOSPITAL_COMMUNITY)
Admission: RE | Admit: 2014-07-22 | Discharge: 2014-07-22 | Disposition: A | Discharge status: Home / Self Care | Payer: BC Managed Care – PPO | Source: Ambulatory Visit | Attending: Gynecology | Admitting: Gynecology

## 2014-07-22 DIAGNOSIS — Z1231 Encounter for screening mammogram for malignant neoplasm of breast: Secondary | ICD-10-CM | POA: Diagnosis present

## 2014-07-22 NOTE — Telephone Encounter (Signed)
Pt informed with the below note, order placed, pt will come on 07/25/14 @ 2:30pm

## 2014-07-23 ENCOUNTER — Other Ambulatory Visit: Payer: BC Managed Care – PPO

## 2014-07-23 ENCOUNTER — Other Ambulatory Visit: Payer: Self-pay | Admitting: Gynecology

## 2014-07-23 DIAGNOSIS — R928 Other abnormal and inconclusive findings on diagnostic imaging of breast: Secondary | ICD-10-CM

## 2014-07-25 ENCOUNTER — Other Ambulatory Visit: Payer: BC Managed Care – PPO

## 2014-07-25 DIAGNOSIS — M898X9 Other specified disorders of bone, unspecified site: Secondary | ICD-10-CM

## 2014-07-25 DIAGNOSIS — M8580 Other specified disorders of bone density and structure, unspecified site: Secondary | ICD-10-CM

## 2014-07-26 LAB — VITAMIN D 25 HYDROXY (VIT D DEFICIENCY, FRACTURES): Vit D, 25-Hydroxy: 51 ng/mL (ref 30–89)

## 2014-07-30 ENCOUNTER — Ambulatory Visit
Admission: RE | Admit: 2014-07-30 | Discharge: 2014-07-30 | Disposition: A | Discharge status: Home / Self Care | Payer: BC Managed Care – PPO | Source: Ambulatory Visit | Attending: Gynecology | Admitting: Gynecology

## 2014-07-30 DIAGNOSIS — R928 Other abnormal and inconclusive findings on diagnostic imaging of breast: Secondary | ICD-10-CM

## 2014-08-25 ENCOUNTER — Encounter: Payer: Self-pay | Admitting: Gynecology

## 2014-09-08 ENCOUNTER — Ambulatory Visit (INDEPENDENT_AMBULATORY_CARE_PROVIDER_SITE_OTHER): Payer: BC Managed Care – PPO | Admitting: Internal Medicine

## 2014-09-08 ENCOUNTER — Encounter: Payer: Self-pay | Admitting: Internal Medicine

## 2014-09-08 VITALS — BP 120/80 | HR 91 | Temp 98.3°F | Ht 60.0 in | Wt 174.0 lb

## 2014-09-08 DIAGNOSIS — M7989 Other specified soft tissue disorders: Secondary | ICD-10-CM

## 2014-09-08 DIAGNOSIS — R030 Elevated blood-pressure reading, without diagnosis of hypertension: Secondary | ICD-10-CM

## 2014-09-08 DIAGNOSIS — M5440 Lumbago with sciatica, unspecified side: Secondary | ICD-10-CM

## 2014-09-08 MED ORDER — IBUPROFEN 400 MG PO TABS: 400.0000 mg | ORAL_TABLET | Freq: Three times a day (TID) | ORAL | Status: DC | PRN

## 2014-09-08 MED ORDER — FUROSEMIDE 20 MG PO TABS: 20.0000 mg | ORAL_TABLET | Freq: Every day | ORAL | Status: DC | PRN

## 2014-09-08 NOTE — Assessment & Plan Note (Signed)
11/15 ?etiology Labs Lasix prn

## 2014-09-08 NOTE — Assessment & Plan Note (Signed)
Stretch See meds

## 2014-09-08 NOTE — Progress Notes (Signed)
Pre visit review using our clinic review tool, if applicable. No additional management support is needed unless otherwise documented below in the visit note. 

## 2014-09-08 NOTE — Assessment & Plan Note (Signed)
NAS Continue with current prescription therapy as reflected on the Med list.

## 2014-09-08 NOTE — Progress Notes (Signed)
Patient ID: Regina Roberts, female   DOB: November 18, 1950, 63 y.o.   MRN: [id number]   Subjective:    HPI  C/o HTN  BP Readings from Last 3 Encounters:  09/08/14 120/80  07/03/14 128/82  06/06/14 128/90   Wt Readings from Last 3 Encounters:  09/08/14 174 lb (78.926 kg)  07/03/14 167 lb (75.751 kg)  06/06/14 167 lb (75.751 kg)      Review of Systems  Constitutional: Negative for chills, activity change, appetite change, fatigue and unexpected weight change.  HENT: Negative for congestion, mouth sores, sinus pressure and voice change.   Eyes: Negative for visual disturbance.  Respiratory: Negative for cough and chest tightness.   Cardiovascular: Negative for palpitations and leg swelling.  Gastrointestinal: Negative for nausea, vomiting and abdominal pain.  Genitourinary: Negative for frequency, difficulty urinating and vaginal pain.  Musculoskeletal: Negative for back pain and gait problem.  Skin: Negative for pallor and rash.  Neurological: Negative for dizziness, tremors, weakness, numbness and headaches.  Psychiatric/Behavioral: Negative for confusion, sleep disturbance and dysphoric mood.       Objective:   Physical Exam  Constitutional: She appears well-developed. No distress.  HENT:  Head: Normocephalic.  Right Ear: External ear normal.  Left Ear: External ear normal.  Nose: Nose normal.  Mouth/Throat: Oropharynx is clear and moist.  Eyes: Conjunctivae are normal. Pupils are equal, round, and reactive to light. Right eye exhibits no discharge. Left eye exhibits no discharge.  Neck: Normal range of motion. Neck supple. No JVD present. No tracheal deviation present. No thyromegaly present.  Cardiovascular: Normal rate, regular rhythm and normal heart sounds.   Pulmonary/Chest: No stridor. No respiratory distress. She has no wheezes.  Abdominal: Soft. Bowel sounds are normal. She exhibits no distension and no mass. There is no tenderness. There is no rebound and no  guarding.  Musculoskeletal: She exhibits no edema or tenderness.  Lymphadenopathy:    She has no cervical adenopathy.  Neurological: She displays normal reflexes. No cranial nerve deficit. She exhibits normal muscle tone. Coordination normal.  Skin: No rash noted. No erythema.  Psychiatric: She has a normal mood and affect. Her behavior is normal. Judgment and thought content normal.   Lab Results  Component Value Date   WBC 13.1* 06/02/2014   HGB 13.8 06/02/2014   HCT 41.5 06/02/2014   PLT 347.0 06/02/2014   GLUCOSE 86 06/02/2014   CHOL 257* 06/02/2014   TRIG 161.0* 06/02/2014   HDL 42.90 06/02/2014   LDLCALC 182* 06/02/2014   ALT 15 06/02/2014   AST 14 06/02/2014   NA 139 06/02/2014   K 3.8 06/02/2014   CL 105 06/02/2014   CREATININE 0.8 06/02/2014   BUN 14 06/02/2014   CO2 28 06/02/2014   TSH 0.44 06/02/2014          Assessment & Plan:

## 2015-03-09 ENCOUNTER — Ambulatory Visit (INDEPENDENT_AMBULATORY_CARE_PROVIDER_SITE_OTHER): Payer: 59 | Admitting: Internal Medicine

## 2015-03-09 ENCOUNTER — Encounter: Payer: Self-pay | Admitting: Internal Medicine

## 2015-03-09 VITALS — BP 120/80 | HR 88 | Wt 178.0 lb

## 2015-03-09 DIAGNOSIS — R42 Dizziness and giddiness: Secondary | ICD-10-CM

## 2015-03-09 DIAGNOSIS — R030 Elevated blood-pressure reading, without diagnosis of hypertension: Secondary | ICD-10-CM

## 2015-03-09 DIAGNOSIS — E785 Hyperlipidemia, unspecified: Secondary | ICD-10-CM

## 2015-03-09 MED ORDER — ATORVASTATIN CALCIUM 10 MG PO TABS: 10.0000 mg | ORAL_TABLET | Freq: Every day | ORAL | Status: DC

## 2015-03-09 MED ORDER — LISINOPRIL-HYDROCHLOROTHIAZIDE 20-12.5 MG PO TABS: 1.0000 | ORAL_TABLET | Freq: Every day | ORAL | Status: DC

## 2015-03-09 NOTE — Progress Notes (Signed)
Pre visit review using our clinic review tool, if applicable. No additional management support is needed unless otherwise documented below in the visit note. 

## 2015-03-09 NOTE — Assessment & Plan Note (Signed)
she will try Lipitor 5/16

## 2015-03-09 NOTE — Assessment & Plan Note (Signed)
Will reduce HCTZ dose in Zestoretic

## 2015-03-09 NOTE — Progress Notes (Signed)
   Subjective:    HPI  C/o HTN C/o occ lightheadedness w/standing up  BP Readings from Last 3 Encounters:  03/09/15 120/80  09/08/14 120/80  07/03/14 128/82   Wt Readings from Last 3 Encounters:  03/09/15 178 lb (80.74 kg)  09/08/14 174 lb (78.926 kg)  07/03/14 167 lb (75.751 kg)      Review of Systems  Constitutional: Negative for chills, activity change, appetite change, fatigue and unexpected weight change.  HENT: Negative for congestion, mouth sores, sinus pressure and voice change.   Eyes: Negative for visual disturbance.  Respiratory: Negative for cough and chest tightness.   Cardiovascular: Negative for palpitations and leg swelling.  Gastrointestinal: Negative for nausea, vomiting and abdominal pain.  Genitourinary: Negative for frequency, difficulty urinating and vaginal pain.  Musculoskeletal: Negative for back pain and gait problem.  Skin: Negative for pallor and rash.  Neurological: Negative for dizziness, tremors, weakness, numbness and headaches.  Psychiatric/Behavioral: Negative for confusion, sleep disturbance and dysphoric mood.       Objective:   Physical Exam  Constitutional: She appears well-developed. No distress.  HENT:  Head: Normocephalic.  Right Ear: External ear normal.  Left Ear: External ear normal.  Nose: Nose normal.  Mouth/Throat: Oropharynx is clear and moist.  Eyes: Conjunctivae are normal. Pupils are equal, round, and reactive to light. Right eye exhibits no discharge. Left eye exhibits no discharge.  Neck: Normal range of motion. Neck supple. No JVD present. No tracheal deviation present. No thyromegaly present.  Cardiovascular: Normal rate, regular rhythm and normal heart sounds.   Pulmonary/Chest: No stridor. No respiratory distress. She has no wheezes.  Abdominal: Soft. Bowel sounds are normal. She exhibits no distension and no mass. There is no tenderness. There is no rebound and no guarding.  Musculoskeletal: She exhibits no  edema or tenderness.  Lymphadenopathy:    She has no cervical adenopathy.  Neurological: She displays normal reflexes. No cranial nerve deficit. She exhibits normal muscle tone. Coordination normal.  Skin: No rash noted. No erythema.  Psychiatric: She has a normal mood and affect. Her behavior is normal. Judgment and thought content normal.   Lab Results  Component Value Date   WBC 13.1* 06/02/2014   HGB 13.8 06/02/2014   HCT 41.5 06/02/2014   PLT 347.0 06/02/2014   GLUCOSE 86 06/02/2014   CHOL 257* 06/02/2014   TRIG 161.0* 06/02/2014   HDL 42.90 06/02/2014   LDLCALC 182* 06/02/2014   ALT 15 06/02/2014   AST 14 06/02/2014   NA 139 06/02/2014   K 3.8 06/02/2014   CL 105 06/02/2014   CREATININE 0.8 06/02/2014   BUN 14 06/02/2014   CO2 28 06/02/2014   TSH 0.44 06/02/2014          Assessment & Plan:

## 2015-05-21 ENCOUNTER — Other Ambulatory Visit: Payer: Self-pay | Admitting: Internal Medicine

## 2015-06-02 ENCOUNTER — Other Ambulatory Visit (INDEPENDENT_AMBULATORY_CARE_PROVIDER_SITE_OTHER): Payer: 59

## 2015-06-02 DIAGNOSIS — E785 Hyperlipidemia, unspecified: Secondary | ICD-10-CM | POA: Diagnosis not present

## 2015-06-02 DIAGNOSIS — R030 Elevated blood-pressure reading, without diagnosis of hypertension: Secondary | ICD-10-CM

## 2015-06-02 DIAGNOSIS — R42 Dizziness and giddiness: Secondary | ICD-10-CM

## 2015-06-02 LAB — BASIC METABOLIC PANEL
BUN: 17 mg/dL (ref 6–23)
CO2: 30 mEq/L (ref 19–32)
Calcium: 10.1 mg/dL (ref 8.4–10.5)
Chloride: 104 mEq/L (ref 96–112)
Creatinine, Ser: 1.04 mg/dL (ref 0.40–1.20)
GFR: 68.78 mL/min (ref >60.00)
Glucose, Bld: 114 mg/dL — ABNORMAL HIGH (ref 70–99)
Potassium: 4 mEq/L (ref 3.5–5.1)
Sodium: 140 mEq/L (ref 135–145)

## 2015-06-02 LAB — HEPATIC FUNCTION PANEL
ALT: 14 U/L (ref 0–35)
AST: 11 U/L (ref 0–37)
Albumin: 4.3 g/dL (ref 3.5–5.2)
Alkaline Phosphatase: 56 U/L (ref 39–117)
Bilirubin, Direct: 0 mg/dL (ref 0.0–0.3)
Total Bilirubin: 0.3 mg/dL (ref 0.2–1.2)
Total Protein: 7.5 g/dL (ref 6.0–8.3)

## 2015-06-02 LAB — LIPID PANEL
Cholesterol: 169 mg/dL (ref 0–200)
HDL: 42.5 mg/dL (ref >39.00)
LDL Cholesterol: 86 mg/dL (ref 0–99)
NonHDL: 126.24
Total CHOL/HDL Ratio: 4
Triglycerides: 199 mg/dL — ABNORMAL HIGH (ref 0.0–149.0)
VLDL: 39.8 mg/dL (ref 0.0–40.0)

## 2015-06-02 LAB — TSH: TSH: 0.66 u[IU]/mL (ref 0.35–4.50)

## 2015-06-09 ENCOUNTER — Encounter: Payer: Self-pay | Admitting: Internal Medicine

## 2015-06-09 ENCOUNTER — Ambulatory Visit (INDEPENDENT_AMBULATORY_CARE_PROVIDER_SITE_OTHER): Payer: 59 | Admitting: Internal Medicine

## 2015-06-09 VITALS — BP 110/68 | HR 76 | Wt 172.0 lb

## 2015-06-09 DIAGNOSIS — E785 Hyperlipidemia, unspecified: Secondary | ICD-10-CM

## 2015-06-09 DIAGNOSIS — E669 Obesity, unspecified: Secondary | ICD-10-CM | POA: Diagnosis not present

## 2015-06-09 DIAGNOSIS — I10 Essential (primary) hypertension: Secondary | ICD-10-CM

## 2015-06-09 MED ORDER — LORCASERIN HCL 10 MG PO TABS: 1.0000 | ORAL_TABLET | Freq: Two times a day (BID) | ORAL | Status: DC

## 2015-06-09 NOTE — Progress Notes (Signed)
Subjective:  Patient ID: Regina Roberts, female    DOB: [birth year]  Age: 64 y.o. MRN: [id number]  CC: No chief complaint on file.   HPI ALESE FURNISS presents for dyslipidemia, HTN, obesity f/u.  Outpatient Prescriptions Prior to Visit  Medication Sig Dispense Refill  . atorvastatin (LIPITOR) 10 MG tablet Take 1 tablet (10 mg total) by mouth daily. 90 tablet 3  . CALCIUM PO Take by mouth daily. OTC    . Cholecalciferol (VITAMIN D-3) 1000 UNITS CAPS Take 1 capsule by mouth daily.    . furosemide (LASIX) 20 MG tablet Take 1 tablet (20 mg total) by mouth daily as needed for edema. 30 tablet 5  . lisinopril-hydrochlorothiazide (ZESTORETIC) 20-12.5 MG per tablet Take 1 tablet by mouth daily. 90 tablet 3  . Multiple Vitamins-Minerals (CENTRUM ADULTS) TABS Take 1 tablet by mouth daily.    . naproxen sodium (ANAPROX) 220 MG tablet Take 220 mg by mouth as needed.    . triamcinolone ointment (KENALOG) 0.5 % Apply 1 application topically 2 (two) times daily. 60 g 3  . ibuprofen (ADVIL,MOTRIN) 400 MG tablet Take 1 tablet (400 mg total) by mouth every 8 (eight) hours as needed for moderate pain. (Patient not taking: Reported on 06/09/2015) 100 tablet 0   No facility-administered medications prior to visit.    ROS Review of Systems  Constitutional: Negative for chills, activity change, appetite change, fatigue and unexpected weight change.  HENT: Negative for congestion, mouth sores and sinus pressure.   Eyes: Negative for visual disturbance.  Respiratory: Negative for cough and chest tightness.   Gastrointestinal: Negative for nausea and abdominal pain.  Genitourinary: Negative for frequency, difficulty urinating and vaginal pain.  Musculoskeletal: Negative for back pain and gait problem.  Skin: Negative for pallor and rash.  Neurological: Negative for dizziness, tremors, weakness, numbness and headaches.  Psychiatric/Behavioral: Negative for confusion and sleep disturbance.     Objective:  BP 110/68 mmHg  Pulse 102  Wt 172 lb (78.019 kg)  SpO2 96%  BP Readings from Last 3 Encounters:  06/09/15 110/68  03/09/15 120/80  09/08/14 120/80    Wt Readings from Last 3 Encounters:  06/09/15 172 lb (78.019 kg)  03/09/15 178 lb (80.74 kg)  09/08/14 174 lb (78.926 kg)    Physical Exam  Constitutional: She appears well-developed. No distress.  Obese  HENT:  Head: Normocephalic.  Right Ear: External ear normal.  Left Ear: External ear normal.  Nose: Nose normal.  Mouth/Throat: Oropharynx is clear and moist.  Eyes: Conjunctivae are normal. Pupils are equal, round, and reactive to light. Right eye exhibits no discharge. Left eye exhibits no discharge.  Neck: Normal range of motion. Neck supple. No JVD present. No tracheal deviation present. No thyromegaly present.  Cardiovascular: Normal rate, regular rhythm and normal heart sounds.   Pulmonary/Chest: No stridor. No respiratory distress. She has no wheezes.  Abdominal: Soft. Bowel sounds are normal. She exhibits no distension and no mass. There is no tenderness. There is no rebound and no guarding.  Musculoskeletal: She exhibits no edema or tenderness.  Lymphadenopathy:    She has no cervical adenopathy.  Neurological: She displays normal reflexes. No cranial nerve deficit. She exhibits normal muscle tone. Coordination normal.  Skin: No rash noted. No erythema.  Psychiatric: She has a normal mood and affect. Her behavior is normal. Judgment and thought content normal.    Lab Results  Component Value Date   WBC 13.1* 06/02/2014   HGB 13.8 06/02/2014  HCT 41.5 06/02/2014   PLT 347.0 06/02/2014   GLUCOSE 114* 06/02/2015   CHOL 169 06/02/2015   TRIG 199.0* 06/02/2015   HDL 42.50 06/02/2015   LDLCALC 86 06/02/2015   ALT 14 06/02/2015   AST 11 06/02/2015   NA 140 06/02/2015   K 4.0 06/02/2015   CL 104 06/02/2015   CREATININE 1.04 06/02/2015   BUN 17 06/02/2015   CO2 30 06/02/2015   TSH 0.66  06/02/2015    Mm Digital Diagnostic Unilat L  07/30/2014   CLINICAL DATA:  Recall from screening mammogram.  EXAM: DIGITAL DIAGNOSTIC  left breast MAMMOGRAM  ULTRASOUND left BREAST  COMPARISON:  Previous examinations dated 07/22/2014 and 06/24/2008.  ACR Breast Density Category b: There are scattered areas of fibroglandular density.  FINDINGS: Additional views of the left breast demonstrate a small, oval, circumscribed nodule located within the upper outer quadrant of the left breast with central lucency most consistent with a small benign intramammary lymph node. This was likely present on the previous study from 2009 but better visualized on the current examination.  On physical exam, there is no discrete palpable abnormality with in the upper-outer quadrant of the left breast.  Ultrasound is performed, showing a normal appearing intramammary lymph node located within the left breast at the 2:30 o'clock position 2 cm from nipple measuring 5 mm in size. There are no additional findings.  IMPRESSION: 5 mm benign appearing intramammary lymph node located within the left breast at the 2:30 o'clock position 2 cm the nipple. No findings worrisome for malignancy.  RECOMMENDATION: Bilateral screening mammography in 1 year.  I have discussed the findings and recommendations with the patient. Results were also provided in writing at the conclusion of the visit. If applicable, a reminder letter will be sent to the patient regarding the next appointment.  BI-RADS CATEGORY  2: Benign.   Electronically Signed   By: Luberta Robertson M.D.   On: 07/30/2014 15:16   US Breast Ltd Uni Left Inc Axilla  07/30/2014   CLINICAL DATA:  Recall from screening mammogram.  EXAM: DIGITAL DIAGNOSTIC  left breast MAMMOGRAM  ULTRASOUND left BREAST  COMPARISON:  Previous examinations dated 07/22/2014 and 06/24/2008.  ACR Breast Density Category b: There are scattered areas of fibroglandular density.  FINDINGS: Additional views of the left  breast demonstrate a small, oval, circumscribed nodule located within the upper outer quadrant of the left breast with central lucency most consistent with a small benign intramammary lymph node. This was likely present on the previous study from 2009 but better visualized on the current examination.  On physical exam, there is no discrete palpable abnormality with in the upper-outer quadrant of the left breast.  Ultrasound is performed, showing a normal appearing intramammary lymph node located within the left breast at the 2:30 o'clock position 2 cm from nipple measuring 5 mm in size. There are no additional findings.  IMPRESSION: 5 mm benign appearing intramammary lymph node located within the left breast at the 2:30 o'clock position 2 cm the nipple. No findings worrisome for malignancy.  RECOMMENDATION: Bilateral screening mammography in 1 year.  I have discussed the findings and recommendations with the patient. Results were also provided in writing at the conclusion of the visit. If applicable, a reminder letter will be sent to the patient regarding the next appointment.  BI-RADS CATEGORY  2: Benign.   Electronically Signed   By: Luberta Robertson M.D.   On: 07/30/2014 15:16    Assessment & Plan:  There are no diagnoses linked to this encounter. I am having Ms. Winstanley maintain her CENTRUM ADULTS, Vitamin D-3, triamcinolone ointment, CALCIUM PO, furosemide, ibuprofen, naproxen sodium, lisinopril-hydrochlorothiazide, and atorvastatin.  No orders of the defined types were placed in this encounter.     Follow-up: No Follow-up on file.  Walker Kehr, MD

## 2015-06-09 NOTE — Assessment & Plan Note (Signed)
Lisinopr-HCT

## 2015-06-09 NOTE — Assessment & Plan Note (Signed)
Better - on Lipitor 

## 2015-06-09 NOTE — Assessment & Plan Note (Addendum)
  Rx options discussed: Belviq, Contrave  Potential benefits of Belviq  use as well as potential risks  and complications were explained to the patient and were aknowledged.

## 2015-06-09 NOTE — Progress Notes (Signed)
Pre visit review using our clinic review tool, if applicable. No additional management support is needed unless otherwise documented below in the visit note. 

## 2015-07-17 ENCOUNTER — Other Ambulatory Visit: Payer: Self-pay

## 2015-07-17 DIAGNOSIS — Z1231 Encounter for screening mammogram for malignant neoplasm of breast: Secondary | ICD-10-CM

## 2015-07-27 ENCOUNTER — Ambulatory Visit
Admission: RE | Admit: 2015-07-27 | Discharge: 2015-07-27 | Disposition: A | Discharge status: Home / Self Care | Payer: 59 | Source: Ambulatory Visit

## 2015-07-27 DIAGNOSIS — Z1231 Encounter for screening mammogram for malignant neoplasm of breast: Secondary | ICD-10-CM

## 2015-09-09 ENCOUNTER — Encounter: Payer: Self-pay | Admitting: Internal Medicine

## 2015-09-09 ENCOUNTER — Ambulatory Visit (INDEPENDENT_AMBULATORY_CARE_PROVIDER_SITE_OTHER): Payer: 59 | Admitting: Internal Medicine

## 2015-09-09 VITALS — BP 120/84 | HR 95 | Wt 177.0 lb

## 2015-09-09 DIAGNOSIS — E669 Obesity, unspecified: Secondary | ICD-10-CM | POA: Diagnosis not present

## 2015-09-09 DIAGNOSIS — E785 Hyperlipidemia, unspecified: Secondary | ICD-10-CM | POA: Diagnosis not present

## 2015-09-09 DIAGNOSIS — I10 Essential (primary) hypertension: Secondary | ICD-10-CM | POA: Diagnosis not present

## 2015-09-09 NOTE — Progress Notes (Signed)
Pre visit review using our clinic review tool, if applicable. No additional management support is needed unless otherwise documented below in the visit note. 

## 2015-09-09 NOTE — Assessment & Plan Note (Signed)
On Lipitor 

## 2015-09-09 NOTE — Patient Instructions (Addendum)
Options for weight loss:  Belviq Contrave, Saxenda Phentermine Regina Roberts

## 2015-09-09 NOTE — Assessment & Plan Note (Signed)
Lisinopr-HCT 

## 2015-09-09 NOTE — Progress Notes (Signed)
   Subjective:  Patient ID: Regina Roberts, female    DOB: 20-Nov-1950  Age: 64 y.o. MRN: [id number]  CC: No chief complaint on file.   HPI Regina Roberts presents for wt gain, HTN, dyslipidemia. Pt did not take Belviq.  Outpatient Prescriptions Prior to Visit  Medication Sig Dispense Refill  . atorvastatin (LIPITOR) 10 MG tablet Take 1 tablet (10 mg total) by mouth daily. 90 tablet 3  . CALCIUM PO Take by mouth daily. OTC    . Cholecalciferol (VITAMIN D-3) 1000 UNITS CAPS Take 1 capsule by mouth daily.    . furosemide (LASIX) 20 MG tablet Take 1 tablet (20 mg total) by mouth daily as needed for edema. 30 tablet 5  . lisinopril-hydrochlorothiazide (ZESTORETIC) 20-12.5 MG per tablet Take 1 tablet by mouth daily. 90 tablet 3  . Multiple Vitamins-Minerals (CENTRUM ADULTS) TABS Take 1 tablet by mouth daily.    . naproxen sodium (ANAPROX) 220 MG tablet Take 220 mg by mouth as needed.    . triamcinolone ointment (KENALOG) 0.5 % Apply 1 application topically 2 (two) times daily. 60 g 3  . ibuprofen (ADVIL,MOTRIN) 400 MG tablet Take 1 tablet (400 mg total) by mouth every 8 (eight) hours as needed for moderate pain. (Patient not taking: Reported on 09/09/2015) 100 tablet 0  . Lorcaserin HCl (BELVIQ) 10 MG TABS Take 1 tablet by mouth 2 (two) times daily. (Patient not taking: Reported on 09/09/2015) 60 tablet 2   No facility-administered medications prior to visit.    ROS Review of Systems  Objective:  BP 120/84 mmHg  Pulse 95  Wt 177 lb (80.287 kg)  SpO2 97%  BP Readings from Last 3 Encounters:  09/09/15 120/84  06/09/15 110/68  03/09/15 120/80    Wt Readings from Last 3 Encounters:  09/09/15 177 lb (80.287 kg)  06/09/15 172 lb (78.019 kg)  03/09/15 178 lb (80.74 kg)    Physical Exam  Lab Results  Component Value Date   WBC 13.1* 06/02/2014   HGB 13.8 06/02/2014   HCT 41.5 06/02/2014   PLT 347.0 06/02/2014   GLUCOSE 114* 06/02/2015   CHOL 169 06/02/2015   TRIG  199.0* 06/02/2015   HDL 42.50 06/02/2015   LDLCALC 86 06/02/2015   ALT 14 06/02/2015   AST 11 06/02/2015   NA 140 06/02/2015   K 4.0 06/02/2015   CL 104 06/02/2015   CREATININE 1.04 06/02/2015   BUN 17 06/02/2015   CO2 30 06/02/2015   TSH 0.66 06/02/2015    Mm Digital Screening Bilateral  07/28/2015  CLINICAL DATA:  Screening. EXAM: DIGITAL SCREENING BILATERAL MAMMOGRAM WITH CAD COMPARISON:  Previous exam(s). ACR Breast Density Category b: There are scattered areas of fibroglandular density. FINDINGS: There are no findings suspicious for malignancy. Images were processed with CAD. IMPRESSION: No mammographic evidence of malignancy. A result letter of this screening mammogram will be mailed directly to the patient. RECOMMENDATION: Screening mammogram in one year. (Code:[id number]) BI-RADS CATEGORY  1: Negative. Electronically Signed   By: Curlene Dolphin M.D.   On: 07/28/2015 07:46    Assessment & Plan:   There are no diagnoses linked to this encounter. I am having Ms. Tromp maintain her CENTRUM ADULTS, Vitamin D-3, triamcinolone ointment, CALCIUM PO, furosemide, ibuprofen, naproxen sodium, lisinopril-hydrochlorothiazide, atorvastatin, and Lorcaserin HCl.  No orders of the defined types were placed in this encounter.     Follow-up: No Follow-up on file.  Walker Kehr, MD

## 2015-09-09 NOTE — Assessment & Plan Note (Signed)
Options for weight loss: Belviq Contrave, Saxenda Phentermine Regina Roberts

## 2015-09-24 ENCOUNTER — Encounter: Payer: Self-pay | Admitting: Gynecology

## 2015-09-24 ENCOUNTER — Ambulatory Visit (INDEPENDENT_AMBULATORY_CARE_PROVIDER_SITE_OTHER): Payer: 59 | Admitting: Gynecology

## 2015-09-24 VITALS — BP 122/80 | Ht 60.0 in | Wt 177.0 lb

## 2015-09-24 DIAGNOSIS — M8580 Other specified disorders of bone density and structure, unspecified site: Secondary | ICD-10-CM

## 2015-09-24 DIAGNOSIS — Z01419 Encounter for gynecological examination (general) (routine) without abnormal findings: Secondary | ICD-10-CM | POA: Diagnosis not present

## 2015-09-24 DIAGNOSIS — N812 Incomplete uterovaginal prolapse: Secondary | ICD-10-CM | POA: Diagnosis not present

## 2015-09-24 DIAGNOSIS — L989 Disorder of the skin and subcutaneous tissue, unspecified: Secondary | ICD-10-CM | POA: Diagnosis not present

## 2015-09-24 NOTE — Patient Instructions (Signed)
Office will call you with the biopsy results.  You may obtain a copy of any labs that were done today by logging onto MyChart as outlined in the instructions provided with your AVS (after visit summary). The office will not call with normal lab results but certainly if there are any significant abnormalities then we will contact you.   Health Maintenance Adopting a healthy lifestyle and getting preventive care can go a long way to promote health and wellness. Talk with your health care provider about what schedule of regular examinations is right for you. This is a good chance for you to check in with your provider about disease prevention and staying healthy. In between checkups, there are plenty of things you can do on your own. Experts have done a lot of research about which lifestyle changes and preventive measures are most likely to keep you healthy. Ask your health care provider for more information. WEIGHT AND DIET  Eat a healthy diet  Be sure to include plenty of vegetables, fruits, low-fat dairy products, and lean protein.  Do not eat a lot of foods high in solid fats, added sugars, or salt.  Get regular exercise. This is one of the most important things you can do for your health.  Most adults should exercise for at least 150 minutes each week. The exercise should increase your heart rate and make you sweat (moderate-intensity exercise).  Most adults should also do strengthening exercises at least twice a week. This is in addition to the moderate-intensity exercise.  Maintain a healthy weight  Body mass index (BMI) is a measurement that can be used to identify possible weight problems. It estimates body fat based on height and weight. Your health care provider can help determine your BMI and help you achieve or maintain a healthy weight.  For females 18 years of age and older:   A BMI below 18.5 is considered underweight.  A BMI of 18.5 to 24.9 is normal.  A BMI of 25 to 29.9  is considered overweight.  A BMI of 30 and above is considered obese.  Watch levels of cholesterol and blood lipids  You should start having your blood tested for lipids and cholesterol at 63 years of age, then have this test every 5 years.  You may need to have your cholesterol levels checked more often if:  Your lipid or cholesterol levels are high.  You are older than 63 years of age.  You are at high risk for heart disease.  CANCER SCREENING   Lung Cancer  Lung cancer screening is recommended for adults 71-41 years old who are at high risk for lung cancer because of a history of smoking.  A yearly low-dose CT scan of the lungs is recommended for people who:  Currently smoke.  Have quit within the past 15 years.  Have at least a 30-pack-year history of smoking. A pack year is smoking an average of one pack of cigarettes a day for 1 year.  Yearly screening should continue until it has been 15 years since you quit.  Yearly screening should stop if you develop a health problem that would prevent you from having lung cancer treatment.  Breast Cancer  Practice breast self-awareness. This means understanding how your breasts normally appear and feel.  It also means doing regular breast self-exams. Let your health care provider know about any changes, no matter how small.  If you are in your 20s or 30s, you should have a clinical breast  exam (CBE) by a health care provider every 1-3 years as part of a regular health exam.  If you are 36 or older, have a CBE every year. Also consider having a breast X-ray (mammogram) every year.  If you have a family history of breast cancer, talk to your health care provider about genetic screening.  If you are at high risk for breast cancer, talk to your health care provider about having an MRI and a mammogram every year.  Breast cancer gene (BRCA) assessment is recommended for women who have family members with BRCA-related cancers.  BRCA-related cancers include:  Breast.  Ovarian.  Tubal.  Peritoneal cancers.  Results of the assessment will determine the need for genetic counseling and BRCA1 and BRCA2 testing. Cervical Cancer Routine pelvic examinations to screen for cervical cancer are no longer recommended for nonpregnant women who are considered low risk for cancer of the pelvic organs (ovaries, uterus, and vagina) and who do not have symptoms. A pelvic examination may be necessary if you have symptoms including those associated with pelvic infections. Ask your health care provider if a screening pelvic exam is right for you.   The Pap test is the screening test for cervical cancer for women who are considered at risk.  If you had a hysterectomy for a problem that was not cancer or a condition that could lead to cancer, then you no longer need Pap tests.  If you are older than 65 years, and you have had normal Pap tests for the past 10 years, you no longer need to have Pap tests.  If you have had past treatment for cervical cancer or a condition that could lead to cancer, you need Pap tests and screening for cancer for at least 20 years after your treatment.  If you no longer get a Pap test, assess your risk factors if they change (such as having a new sexual partner). This can affect whether you should start being screened again.  Some women have medical problems that increase their chance of getting cervical cancer. If this is the case for you, your health care provider may recommend more frequent screening and Pap tests.  The human papillomavirus (HPV) test is another test that may be used for cervical cancer screening. The HPV test looks for the virus that can cause cell changes in the cervix. The cells collected during the Pap test can be tested for HPV.  The HPV test can be used to screen women 8 years of age and older. Getting tested for HPV can extend the interval between normal Pap tests from three to  five years.  An HPV test also should be used to screen women of any age who have unclear Pap test results.  After 63 years of age, women should have HPV testing as often as Pap tests.  Colorectal Cancer  This type of cancer can be detected and often prevented.  Routine colorectal cancer screening usually begins at 63 years of age and continues through 63 years of age.  Your health care provider may recommend screening at an earlier age if you have risk factors for colon cancer.  Your health care provider may also recommend using home test kits to check for hidden blood in the stool.  A small camera at the end of a tube can be used to examine your colon directly (sigmoidoscopy or colonoscopy). This is done to check for the earliest forms of colorectal cancer.  Routine screening usually begins at age 26.  Direct examination of the colon should be repeated every 5-10 years through 63 years of age. However, you may need to be screened more often if early forms of precancerous polyps or small growths are found. Skin Cancer  Check your skin from head to toe regularly.  Tell your health care provider about any new moles or changes in moles, especially if there is a change in a mole's shape or color.  Also tell your health care provider if you have a mole that is larger than the size of a pencil eraser.  Always use sunscreen. Apply sunscreen liberally and repeatedly throughout the day.  Protect yourself by wearing long sleeves, pants, a wide-brimmed hat, and sunglasses whenever you are outside. HEART DISEASE, DIABETES, AND HIGH BLOOD PRESSURE   Have your blood pressure checked at least every 1-2 years. High blood pressure causes heart disease and increases the risk of stroke.  If you are between 76 years and 55 years old, ask your health care provider if you should take aspirin to prevent strokes.  Have regular diabetes screenings. This involves taking a blood sample to check your  fasting blood sugar level.  If you are at a normal weight and have a low risk for diabetes, have this test once every three years after 63 years of age.  If you are overweight and have a high risk for diabetes, consider being tested at a younger age or more often. PREVENTING INFECTION  Hepatitis B  If you have a higher risk for hepatitis B, you should be screened for this virus. You are considered at high risk for hepatitis B if:  You were born in a country where hepatitis B is common. Ask your health care provider which countries are considered high risk.  Your parents were born in a high-risk country, and you have not been immunized against hepatitis B (hepatitis B vaccine).  You have HIV or AIDS.  You use needles to inject street drugs.  You live with someone who has hepatitis B.  You have had sex with someone who has hepatitis B.  You get hemodialysis treatment.  You take certain medicines for conditions, including cancer, organ transplantation, and autoimmune conditions. Hepatitis C  Blood testing is recommended for:  Everyone born from [birth year] through 1964.  Anyone with known risk factors for hepatitis C. Sexually transmitted infections (STIs)  You should be screened for sexually transmitted infections (STIs) including gonorrhea and chlamydia if:  You are sexually active and are younger than 63 years of age.  You are older than 63 years of age and your health care provider tells you that you are at risk for this type of infection.  Your sexual activity has changed since you were last screened and you are at an increased risk for chlamydia or gonorrhea. Ask your health care provider if you are at risk.  If you do not have HIV, but are at risk, it may be recommended that you take a prescription medicine daily to prevent HIV infection. This is called pre-exposure prophylaxis (PrEP). You are considered at risk if:  You are sexually active and do not regularly use condoms or  know the HIV status of your partner(s).  You take drugs by injection.  You are sexually active with a partner who has HIV. Talk with your health care provider about whether you are at high risk of being infected with HIV. If you choose to begin PrEP, you should first be tested for HIV. You should then be  tested every 3 months for as long as you are taking PrEP.  PREGNANCY   If you are premenopausal and you may become pregnant, ask your health care provider about preconception counseling.  If you may become pregnant, take 400 to 800 micrograms (mcg) of folic acid every day.  If you want to prevent pregnancy, talk to your health care provider about birth control (contraception). OSTEOPOROSIS AND MENOPAUSE   Osteoporosis is a disease in which the bones lose minerals and strength with aging. This can result in serious bone fractures. Your risk for osteoporosis can be identified using a bone density scan.  If you are 25 years of age or older, or if you are at risk for osteoporosis and fractures, ask your health care provider if you should be screened.  Ask your health care provider whether you should take a calcium or vitamin D supplement to lower your risk for osteoporosis.  Menopause may have certain physical symptoms and risks.  Hormone replacement therapy may reduce some of these symptoms and risks. Talk to your health care provider about whether hormone replacement therapy is right for you.  HOME CARE INSTRUCTIONS   Schedule regular health, dental, and eye exams.  Stay current with your immunizations.   Do not use any tobacco products including cigarettes, chewing tobacco, or electronic cigarettes.  If you are pregnant, do not drink alcohol.  If you are breastfeeding, limit how much and how often you drink alcohol.  Limit alcohol intake to no more than 1 drink per day for nonpregnant women. One drink equals 12 ounces of beer, 5 ounces of wine, or 1 ounces of hard liquor.  Do  not use street drugs.  Do not share needles.  Ask your health care provider for help if you need support or information about quitting drugs.  Tell your health care provider if you often feel depressed.  Tell your health care provider if you have ever been abused or do not feel safe at home. Document Released: 04/25/2011 Document Revised: 02/24/2014 Document Reviewed: 09/11/2013 [facility] Patient Information 2015 [place], [place]. This information is not intended to replace advice given to you by your health care provider. Make sure you discuss any questions you have with your health care provider.

## 2015-09-24 NOTE — Progress Notes (Signed)
Regina Roberts 08/16/1951 [id number]        64 y.o.  G2P0011  No LMP recorded. Patient has had a hysterectomy. for annual exam.  Several issues noted below.  Past medical history,surgical history, problem list, medications, allergies, family history and social history were all reviewed and documented as reviewed in the EPIC chart.  ROS:  Performed with pertinent positives and negatives included in the history, assessment and plan.   Additional significant findings :  none   Exam: Leanne Lovely Vitals:   09/24/15 1528  BP: 122/80  Height: 5' (1.524 m)  Weight: 177 lb (80.287 kg)   General appearance:  Normal affect, orientation and appearance. Skin: Grossly normal excepting pedunculated skin tag upper left back HEENT: Without gross lesions.  No cervical or supraclavicular adenopathy. Thyroid normal.  Lungs:  Clear without wheezing, rales or rhonchi Cardiac: RR, without RMG Abdominal:  Soft, nontender, without masses, guarding, rebound, organomegaly or hernia Breasts:  Examined lying and sitting without masses, retractions, discharge or axillary adenopathy. Pelvic:  Ext/BUS/vagina with cervical prolapse to the introitus with no evidence of irritation or erosion  Cervix prolapsed to the introitus  Adnexa  Without masses or tenderness    Anus and perineum  Normal   Rectovaginal  Normal sphincter tone without palpated masses or tenderness.   Procedure: The skin surrounding the upper left back skin tag was cleansed with Betadine, infiltrated with 1% lidocaine in the skin tag was excised in its entirety to the level of the surrounding skin. Silver nitrate hemostasis applied. Sterile Band-Aid applied afterwards.   Assessment/Plan:  64 y.o. G7P0011 female for annual exam.   1. Cervical prolapse. Patient with history of supracervical hysterectomy. Has had prolapse of the cervix to the level of the introitus of the last several years. Patient's comfortable with following.  Options to include trial of pessary or trachelectomy with possible vaginal suspension discussed. Patient not interested in intervention at this time. Will follow up if she is. 2. Skin tag left upper back. Patient wanted removed as above. Will follow up for pathology report. 3. Pap smear 2015. No Pap smear done today. We'll continue with every 3 year Pap smears. No history of significant abnormal Pap smears. 4. Mammography 07/2015. Continue with annual mammography. SBE monthly reviewed. 5. Osteopenia. DEXA 2015 T score -2.4 FRAX 3.5%/0.3%. Check vitamin D level today. Repeat DEXA at two-year interval. 6. Health maintenance. No routine blood work done as this was recently done by Dr. Alain Marion. Follow up for biopsy results. Follow up in one year, sooner if any issues.   Anastasio Auerbach MD, 4:03 PM 09/24/2015

## 2015-09-25 LAB — URINALYSIS W MICROSCOPIC + REFLEX CULTURE
Bacteria, UA: NONE SEEN [HPF]
Bilirubin Urine: NEGATIVE
Casts: NONE SEEN [LPF]
Crystals: NONE SEEN [HPF]
Glucose, UA: NEGATIVE
Hgb urine dipstick: NEGATIVE
Ketones, ur: NEGATIVE
Leukocytes, UA: NEGATIVE
Nitrite: NEGATIVE
Protein, ur: NEGATIVE
Specific Gravity, Urine: 1.019 (ref 1.001–1.035)
Yeast: NONE SEEN [HPF]
pH: 6 (ref 5.0–8.0)

## 2015-09-25 LAB — VITAMIN D 25 HYDROXY (VIT D DEFICIENCY, FRACTURES): Vit D, 25-Hydroxy: 45 ng/mL (ref 30–100)

## 2015-09-26 LAB — URINE CULTURE
Colony Count: NO GROWTH
Organism ID, Bacteria: NO GROWTH

## 2016-01-08 ENCOUNTER — Ambulatory Visit: Payer: 59 | Admitting: Internal Medicine

## 2016-01-27 ENCOUNTER — Ambulatory Visit (INDEPENDENT_AMBULATORY_CARE_PROVIDER_SITE_OTHER): Payer: BLUE CROSS/BLUE SHIELD | Admitting: Internal Medicine

## 2016-01-27 ENCOUNTER — Encounter: Payer: Self-pay | Admitting: Internal Medicine

## 2016-01-27 VITALS — BP 116/74 | HR 111 | Temp 98.7°F | Ht 60.0 in | Wt 177.0 lb

## 2016-01-27 DIAGNOSIS — E669 Obesity, unspecified: Secondary | ICD-10-CM

## 2016-01-27 MED ORDER — FUROSEMIDE 20 MG PO TABS: 20.0000 mg | ORAL_TABLET | Freq: Every day | ORAL | Status: DC | PRN

## 2016-01-27 MED ORDER — LISINOPRIL-HYDROCHLOROTHIAZIDE 20-12.5 MG PO TABS: 1.0000 | ORAL_TABLET | Freq: Every day | ORAL | Status: DC

## 2016-01-27 MED ORDER — LORCASERIN HCL ER 20 MG PO TB24: 1.0000 | ORAL_TABLET | Freq: Every day | ORAL | Status: DC

## 2016-01-27 MED ORDER — ATORVASTATIN CALCIUM 10 MG PO TABS: 10.0000 mg | ORAL_TABLET | Freq: Every day | ORAL | Status: DC

## 2016-01-27 MED ORDER — TRIAMCINOLONE ACETONIDE 0.5 % EX OINT: 1.0000 "application " | TOPICAL_OINTMENT | Freq: Two times a day (BID) | CUTANEOUS | Status: DC

## 2016-01-27 NOTE — Progress Notes (Signed)
Pre visit review using our clinic review tool, if applicable. No additional management support is needed unless otherwise documented below in the visit note. 

## 2016-01-27 NOTE — Assessment & Plan Note (Addendum)
Will try Belviq XR  Potential benefits of a long term Belviq use as well as potential risks  and complications were explained to the patient and were aknowledged.

## 2016-01-27 NOTE — Progress Notes (Signed)
Subjective:  Patient ID: Regina Roberts, female    DOB: Mar 11, 1951  Age: 65 y.o. MRN: [id number]  CC: Follow-up   HPI Regina Roberts presents for obesity. Regina Roberts did not help with wt loss  Outpatient Prescriptions Prior to Visit  Medication Sig Dispense Refill  . CALCIUM PO Take by mouth daily. OTC    . Cholecalciferol (VITAMIN D-3) 1000 UNITS CAPS Take 1 capsule by mouth daily.    . Multiple Vitamins-Minerals (CENTRUM ADULTS) TABS Take 1 tablet by mouth daily.    . naproxen sodium (ANAPROX) 220 MG tablet Take 220 mg by mouth as needed.    Marland Kitchen atorvastatin (LIPITOR) 10 MG tablet Take 1 tablet (10 mg total) by mouth daily. 90 tablet 3  . furosemide (LASIX) 20 MG tablet Take 1 tablet (20 mg total) by mouth daily as needed for edema. 30 tablet 5  . lisinopril-hydrochlorothiazide (ZESTORETIC) 20-12.5 MG per tablet Take 1 tablet by mouth daily. 90 tablet 3  . triamcinolone ointment (KENALOG) 0.5 % Apply 1 application topically 2 (two) times daily. 60 g 3   No facility-administered medications prior to visit.    ROS Review of Systems  Constitutional: Negative for fatigue and unexpected weight change.  Respiratory: Negative for cough, chest tightness and shortness of breath.     Objective:  BP 116/74 mmHg  Pulse 111  Temp(Src) 98.7 F (37.1 C) (Oral)  Ht 5' (1.524 m)  Wt 177 lb (80.287 kg)  BMI 34.57 kg/m2  SpO2 95%  BP Readings from Last 3 Encounters:  01/27/16 116/74  09/24/15 122/80  09/09/15 120/84    Wt Readings from Last 3 Encounters:  01/27/16 177 lb (80.287 kg)  09/24/15 177 lb (80.287 kg)  09/09/15 177 lb (80.287 kg)    Physical Exam  Constitutional: No distress.  Cardiovascular:  No murmur heard. Pulmonary/Chest: No respiratory distress. She has no rales.  Abdominal: There is no tenderness. There is no guarding.  Musculoskeletal: She exhibits no edema.  Lymphadenopathy:    She has no cervical adenopathy.  Obese  Lab Results  Component Value Date     WBC 13.1* 06/02/2014   HGB 13.8 06/02/2014   HCT 41.5 06/02/2014   PLT 347.0 06/02/2014   GLUCOSE 114* 06/02/2015   CHOL 169 06/02/2015   TRIG 199.0* 06/02/2015   HDL 42.50 06/02/2015   LDLCALC 86 06/02/2015   ALT 14 06/02/2015   AST 11 06/02/2015   NA 140 06/02/2015   K 4.0 06/02/2015   CL 104 06/02/2015   CREATININE 1.04 06/02/2015   BUN 17 06/02/2015   CO2 30 06/02/2015   TSH 0.66 06/02/2015    Mm Digital Screening Bilateral  07/28/2015  CLINICAL DATA:  Screening. EXAM: DIGITAL SCREENING BILATERAL MAMMOGRAM WITH CAD COMPARISON:  Previous exam(s). ACR Breast Density Category b: There are scattered areas of fibroglandular density. FINDINGS: There are no findings suspicious for malignancy. Images were processed with CAD. IMPRESSION: No mammographic evidence of malignancy. A result letter of this screening mammogram will be mailed directly to the patient. RECOMMENDATION: Screening mammogram in one year. (Code:SM-B-01Y) BI-RADS CATEGORY  1: Negative. Electronically Signed   By: Curlene Dolphin M.D.   On: 07/28/2015 07:46    Assessment & Plan:   There are no diagnoses linked to this encounter. I have changed Regina Roberts lisinopril-hydrochlorothiazide. I am also having her maintain her CENTRUM ADULTS, Vitamin D-3, CALCIUM PO, naproxen sodium, furosemide, atorvastatin, and triamcinolone ointment.  Meds ordered this encounter  Medications  . lisinopril-hydrochlorothiazide (ZESTORETIC)  20-12.5 MG tablet    Sig: Take 1 tablet by mouth daily.    Dispense:  90 tablet    Refill:  3  . furosemide (LASIX) 20 MG tablet    Sig: Take 1 tablet (20 mg total) by mouth daily as needed for edema.    Dispense:  30 tablet    Refill:  5  . atorvastatin (LIPITOR) 10 MG tablet    Sig: Take 1 tablet (10 mg total) by mouth daily.    Dispense:  90 tablet    Refill:  3  . triamcinolone ointment (KENALOG) 0.5 %    Sig: Apply 1 application topically 2 (two) times daily.    Dispense:  60 g    Refill:   3     Follow-up: No Follow-up on file.  Walker Kehr, MD

## 2016-04-20 ENCOUNTER — Ambulatory Visit: Payer: BLUE CROSS/BLUE SHIELD | Admitting: Internal Medicine

## 2016-05-02 ENCOUNTER — Ambulatory Visit (INDEPENDENT_AMBULATORY_CARE_PROVIDER_SITE_OTHER): Payer: BLUE CROSS/BLUE SHIELD | Admitting: Internal Medicine

## 2016-05-02 ENCOUNTER — Encounter: Payer: Self-pay | Admitting: Internal Medicine

## 2016-05-02 VITALS — BP 110/70 | HR 88 | Wt 176.0 lb

## 2016-05-02 DIAGNOSIS — E669 Obesity, unspecified: Secondary | ICD-10-CM | POA: Diagnosis not present

## 2016-05-02 MED ORDER — PHENTERMINE HCL 37.5 MG PO TABS: 37.5000 mg | ORAL_TABLET | Freq: Every day | ORAL | Status: DC

## 2016-05-02 NOTE — Progress Notes (Signed)
Pre visit review using our clinic review tool, if applicable. No additional management support is needed unless otherwise documented below in the visit note. 

## 2016-05-02 NOTE — Assessment & Plan Note (Signed)
Will try Phentermine. BP, HR are ok  Potential benefits of a long term phentermine use as well as potential risks  and complications were explained to the patient and were aknowledged.

## 2016-05-02 NOTE — Progress Notes (Signed)
Subjective:  Patient ID: Regina Roberts, female    DOB: 1951/10/24  Age: 65 y.o. MRN: [id number]  CC: No chief complaint on file.   HPI Regina Roberts presents for obesity. F/u HTN. Belviq was too $$  Outpatient Prescriptions Prior to Visit  Medication Sig Dispense Refill  . atorvastatin (LIPITOR) 10 MG tablet Take 1 tablet (10 mg total) by mouth daily. 90 tablet 3  . CALCIUM PO Take by mouth daily. OTC    . Cholecalciferol (VITAMIN D-3) 1000 UNITS CAPS Take 1 capsule by mouth daily.    . furosemide (LASIX) 20 MG tablet Take 1 tablet (20 mg total) by mouth daily as needed for edema. 30 tablet 5  . lisinopril-hydrochlorothiazide (ZESTORETIC) 20-12.5 MG tablet Take 1 tablet by mouth daily. 90 tablet 3  . Lorcaserin HCl ER (BELVIQ XR) 20 MG TB24 Take 1 tablet by mouth daily. 30 tablet 3  . Multiple Vitamins-Minerals (CENTRUM ADULTS) TABS Take 1 tablet by mouth daily.    . naproxen sodium (ANAPROX) 220 MG tablet Take 220 mg by mouth as needed.    . triamcinolone ointment (KENALOG) 0.5 % Apply 1 application topically 2 (two) times daily. 60 g 3   No facility-administered medications prior to visit.    ROS Review of Systems  Constitutional: Negative for chills, activity change, appetite change, fatigue and unexpected weight change.  HENT: Negative for congestion, mouth sores and sinus pressure.   Eyes: Negative for visual disturbance.  Respiratory: Negative for cough and chest tightness.   Gastrointestinal: Negative for nausea and abdominal pain.  Genitourinary: Negative for frequency, difficulty urinating and vaginal pain.  Musculoskeletal: Negative for back pain and gait problem.  Skin: Negative for pallor and rash.  Neurological: Negative for dizziness, tremors, weakness, numbness and headaches.  Psychiatric/Behavioral: Negative for confusion and sleep disturbance. The patient is not nervous/anxious.     Objective:  BP 110/70 mmHg  Pulse 88  Wt 176 lb (79.833 kg)   SpO2 95%  BP Readings from Last 3 Encounters:  05/02/16 110/70  01/27/16 116/74  09/24/15 122/80    Wt Readings from Last 3 Encounters:  05/02/16 176 lb (79.833 kg)  01/27/16 177 lb (80.287 kg)  09/24/15 177 lb (80.287 kg)    Physical Exam  Constitutional: She appears well-developed. No distress.  HENT:  Head: Normocephalic.  Right Ear: External ear normal.  Left Ear: External ear normal.  Nose: Nose normal.  Mouth/Throat: Oropharynx is clear and moist.  Eyes: Conjunctivae are normal. Pupils are equal, round, and reactive to light. Right eye exhibits no discharge. Left eye exhibits no discharge.  Neck: Normal range of motion. Neck supple. No JVD present. No tracheal deviation present. No thyromegaly present.  Cardiovascular: Normal rate, regular rhythm and normal heart sounds.   Pulmonary/Chest: No stridor. No respiratory distress. She has no wheezes.  Abdominal: Soft. Bowel sounds are normal. She exhibits no distension and no mass. There is no tenderness. There is no rebound and no guarding.  Musculoskeletal: She exhibits no edema or tenderness.  Lymphadenopathy:    She has no cervical adenopathy.  Neurological: She displays normal reflexes. No cranial nerve deficit. She exhibits normal muscle tone. Coordination normal.  Skin: No rash noted. No erythema.  Psychiatric: She has a normal mood and affect. Her behavior is normal. Judgment and thought content normal.    Lab Results  Component Value Date   WBC 13.1* 06/02/2014   HGB 13.8 06/02/2014   HCT 41.5 06/02/2014   PLT 347.0  06/02/2014   GLUCOSE 114* 06/02/2015   CHOL 169 06/02/2015   TRIG 199.0* 06/02/2015   HDL 42.50 06/02/2015   LDLCALC 86 06/02/2015   ALT 14 06/02/2015   AST 11 06/02/2015   NA 140 06/02/2015   K 4.0 06/02/2015   CL 104 06/02/2015   CREATININE 1.04 06/02/2015   BUN 17 06/02/2015   CO2 30 06/02/2015   TSH 0.66 06/02/2015    Mm Digital Screening Bilateral  07/28/2015  CLINICAL DATA:   Screening. EXAM: DIGITAL SCREENING BILATERAL MAMMOGRAM WITH CAD COMPARISON:  Previous exam(s). ACR Breast Density Category b: There are scattered areas of fibroglandular density. FINDINGS: There are no findings suspicious for malignancy. Images were processed with CAD. IMPRESSION: No mammographic evidence of malignancy. A result letter of this screening mammogram will be mailed directly to the patient. RECOMMENDATION: Screening mammogram in one year. (Code:SM-B-01Y) BI-RADS CATEGORY  1: Negative. Electronically Signed   By: Curlene Dolphin M.D.   On: 07/28/2015 07:46    Assessment & Plan:   There are no diagnoses linked to this encounter. I am having [name] maintain her CENTRUM ADULTS, Vitamin D-3, CALCIUM PO, naproxen sodium, lisinopril-hydrochlorothiazide, furosemide, atorvastatin, triamcinolone ointment, and Lorcaserin HCl ER.  No orders of the defined types were placed in this encounter.     Follow-up: No Follow-up on file.  Walker Kehr, MD

## 2016-07-25 ENCOUNTER — Other Ambulatory Visit: Payer: Self-pay | Admitting: Gynecology

## 2016-07-25 DIAGNOSIS — Z1231 Encounter for screening mammogram for malignant neoplasm of breast: Secondary | ICD-10-CM

## 2016-08-01 ENCOUNTER — Encounter: Payer: Self-pay | Admitting: Internal Medicine

## 2016-08-01 ENCOUNTER — Ambulatory Visit (INDEPENDENT_AMBULATORY_CARE_PROVIDER_SITE_OTHER): Payer: BLUE CROSS/BLUE SHIELD | Admitting: Internal Medicine

## 2016-08-01 ENCOUNTER — Ambulatory Visit
Admission: RE | Admit: 2016-08-01 | Discharge: 2016-08-01 | Disposition: A | Discharge status: Home / Self Care | Payer: BLUE CROSS/BLUE SHIELD | Source: Ambulatory Visit | Attending: Gynecology | Admitting: Gynecology

## 2016-08-01 DIAGNOSIS — E6609 Other obesity due to excess calories: Secondary | ICD-10-CM

## 2016-08-01 DIAGNOSIS — Z23 Encounter for immunization: Secondary | ICD-10-CM

## 2016-08-01 DIAGNOSIS — I10 Essential (primary) hypertension: Secondary | ICD-10-CM

## 2016-08-01 DIAGNOSIS — Z1231 Encounter for screening mammogram for malignant neoplasm of breast: Secondary | ICD-10-CM

## 2016-08-01 DIAGNOSIS — E66811 Obesity, class 1: Secondary | ICD-10-CM

## 2016-08-01 DIAGNOSIS — Z6834 Body mass index (BMI) 34.0-34.9, adult: Secondary | ICD-10-CM

## 2016-08-01 MED ORDER — PHENTERMINE HCL 37.5 MG PO TABS: 37.5000 mg | ORAL_TABLET | Freq: Every day | ORAL | 2 refills | Status: DC

## 2016-08-01 NOTE — Progress Notes (Signed)
Subjective:  Patient ID: Regina Roberts, female    DOB: 07-01-1951  Age: 65 y.o. MRN: [id number]  CC: No chief complaint on file.   HPI BURNA HEUTON presents for obesity, dyslipidemia, HTN f/u  Outpatient Medications Prior to Visit  Medication Sig Dispense Refill  . atorvastatin (LIPITOR) 10 MG tablet Take 1 tablet (10 mg total) by mouth daily. 90 tablet 3  . CALCIUM PO Take by mouth daily. OTC    . Cholecalciferol (VITAMIN D-3) 1000 UNITS CAPS Take 1 capsule by mouth daily.    . furosemide (LASIX) 20 MG tablet Take 1 tablet (20 mg total) by mouth daily as needed for edema. 30 tablet 5  . lisinopril-hydrochlorothiazide (ZESTORETIC) 20-12.5 MG tablet Take 1 tablet by mouth daily. 90 tablet 3  . Multiple Vitamins-Minerals (CENTRUM ADULTS) TABS Take 1 tablet by mouth daily.    . naproxen sodium (ANAPROX) 220 MG tablet Take 220 mg by mouth as needed.    . phentermine (ADIPEX-P) 37.5 MG tablet Take 1 tablet (37.5 mg total) by mouth daily before breakfast. 30 tablet 2  . triamcinolone ointment (KENALOG) 0.5 % Apply 1 application topically 2 (two) times daily. 60 g 3   No facility-administered medications prior to visit.     ROS Review of Systems  Constitutional: Negative for activity change, appetite change, chills, fatigue and unexpected weight change.  HENT: Negative for congestion, mouth sores and sinus pressure.   Eyes: Negative for visual disturbance.  Respiratory: Negative for cough and chest tightness.   Gastrointestinal: Negative for abdominal pain and nausea.  Genitourinary: Negative for difficulty urinating, frequency and vaginal pain.  Musculoskeletal: Negative for back pain and gait problem.  Skin: Negative for pallor and rash.  Neurological: Negative for dizziness, tremors, weakness, numbness and headaches.  Psychiatric/Behavioral: Negative for confusion and sleep disturbance.    Objective:  BP 110/72   Pulse (!) 114   Wt 171 lb (77.6 kg)   SpO2 95%   BMI  33.40 kg/m   BP Readings from Last 3 Encounters:  08/01/16 110/72  05/02/16 110/70  01/27/16 116/74    Wt Readings from Last 3 Encounters:  08/01/16 171 lb (77.6 kg)  05/02/16 176 lb (79.8 kg)  01/27/16 177 lb (80.3 kg)    Physical Exam  Constitutional: She appears well-developed. No distress.  HENT:  Head: Normocephalic.  Right Ear: External ear normal.  Left Ear: External ear normal.  Nose: Nose normal.  Mouth/Throat: Oropharynx is clear and moist.  Eyes: Conjunctivae are normal. Pupils are equal, round, and reactive to light. Right eye exhibits no discharge. Left eye exhibits no discharge.  Neck: Normal range of motion. Neck supple. No JVD present. No tracheal deviation present. No thyromegaly present.  Cardiovascular: Normal rate, regular rhythm and normal heart sounds.   Pulmonary/Chest: No stridor. No respiratory distress. She has no wheezes.  Abdominal: Soft. Bowel sounds are normal. She exhibits no distension and no mass. There is no tenderness. There is no rebound and no guarding.  Musculoskeletal: She exhibits no edema or tenderness.  Lymphadenopathy:    She has no cervical adenopathy.  Neurological: She displays normal reflexes. No cranial nerve deficit. She exhibits normal muscle tone. Coordination normal.  Skin: No rash noted. No erythema.  Psychiatric: She has a normal mood and affect. Her behavior is normal. Judgment and thought content normal.    Lab Results  Component Value Date   WBC 13.1 (H) 06/02/2014   HGB 13.8 06/02/2014   HCT 41.5 06/02/2014  PLT 347.0 06/02/2014   GLUCOSE 114 (H) 06/02/2015   CHOL 169 06/02/2015   TRIG 199.0 (H) 06/02/2015   HDL 42.50 06/02/2015   LDLCALC 86 06/02/2015   ALT 14 06/02/2015   AST 11 06/02/2015   NA 140 06/02/2015   K 4.0 06/02/2015   CL 104 06/02/2015   CREATININE 1.04 06/02/2015   BUN 17 06/02/2015   CO2 30 06/02/2015   TSH 0.66 06/02/2015    No results found.  Assessment & Plan:   There are no  diagnoses linked to this encounter. I am having Ms. Crespo maintain her CENTRUM ADULTS, Vitamin D-3, CALCIUM PO, naproxen sodium, lisinopril-hydrochlorothiazide, furosemide, atorvastatin, triamcinolone ointment, and phentermine.  No orders of the defined types were placed in this encounter.    Follow-up: No Follow-up on file.  Walker Kehr, MD

## 2016-08-01 NOTE — Assessment & Plan Note (Signed)
Phentermine

## 2016-08-01 NOTE — Progress Notes (Signed)
Pre visit review using our clinic review tool, if applicable. No additional management support is needed unless otherwise documented below in the visit note. 

## 2016-08-01 NOTE — Assessment & Plan Note (Signed)
IF low BP (<100/70) take Lisinopril HCT 1/2 tab a day

## 2016-08-01 NOTE — Addendum Note (Signed)
Addended by: Cresenciano Lick on: 08/01/2016 04:26 PM   Modules accepted: Orders

## 2016-09-26 ENCOUNTER — Encounter: Payer: Self-pay | Admitting: Gynecology

## 2016-09-26 ENCOUNTER — Ambulatory Visit (INDEPENDENT_AMBULATORY_CARE_PROVIDER_SITE_OTHER): Payer: BLUE CROSS/BLUE SHIELD | Admitting: Gynecology

## 2016-09-26 VITALS — BP 120/76 | Ht 60.0 in | Wt 167.0 lb

## 2016-09-26 DIAGNOSIS — M8580 Other specified disorders of bone density and structure, unspecified site: Secondary | ICD-10-CM | POA: Diagnosis not present

## 2016-09-26 DIAGNOSIS — N812 Incomplete uterovaginal prolapse: Secondary | ICD-10-CM

## 2016-09-26 DIAGNOSIS — N952 Postmenopausal atrophic vaginitis: Secondary | ICD-10-CM | POA: Diagnosis not present

## 2016-09-26 DIAGNOSIS — Z01411 Encounter for gynecological examination (general) (routine) with abnormal findings: Secondary | ICD-10-CM

## 2016-09-26 NOTE — Progress Notes (Signed)
    Regina Roberts 04-16-1951 [id number]        65 y.o.  G2P0011  for annual exam.  In addition the patient notes that her cervical prolapse is becoming more bothersome with pressure and discomfort protruding from the vagina. No urinary symptoms such as frequency dysuria or urgency or incontinence.  Past medical history,surgical history, problem list, medications, allergies, family history and social history were all reviewed and documented as reviewed in the EPIC chart.  ROS:  Performed with pertinent positives and negatives included in the history, assessment and plan.   Additional significant findings :  None   Exam: [name] Vitals:   09/26/16 1427  BP: 120/76  Weight: 167 lb (75.8 kg)  Height: 5' (1.524 m)   Body mass index is 32.61 kg/m.  General appearance:  Normal affect, orientation and appearance. Skin: Grossly normal HEENT: Without gross lesions.  No cervical or supraclavicular adenopathy. Thyroid normal.  Lungs:  Clear without wheezing, rales or rhonchi Cardiac: RR, without RMG Abdominal:  Soft, nontender, without masses, guarding, rebound, organomegaly or hernia Breasts:  Examined lying and sitting without masses, retractions, discharge or axillary adenopathy. Pelvic:  Ext, BUS, Vagina with atrophic changes.  Cervix at the level of the introitus and protruding out. No ulcerations or other abnormalities.  Adnexa without masses or tenderness    Anus and perineum normal   Rectovaginal normal sphincter tone without palpated masses or tenderness.   Procedure: #3 ring pessary with support was placed, the patient ambulated with good support provided and her not feeling the pessary. She was able to remove the pessary herself.  Assessment/Plan:  65 y.o. G25P0011 female for annual exam.   1. Postmenopausal/atrophic genital changes. No significant hot flushes, night sweats or vaginal dryness. No vaginal bleeding. 2. Cervical prolapse. Becoming more  symptomatic. I again reviewed the options to include trial of pessary versus trachelectomy our observation. Possible vaginal suspension also discussed. Patient wants trial of pessary. We placed the pessary as above and she seems to do well with this and is able to remove it noting she is sexually active. Will go ahead and order her the pessary and she'll represent for placement and hopefully will do well from there. 3. Osteopenia. DEXA 2015 T score -2.4 FRAX 3.5%/0.3%. Schedule DEXA now at a 2 year interval when she agrees to do so. 4. Pap smear 2015. No Pap smear done today. No history of significant abnormal Pap smears. Plan repeat Pap smear at 3 year interval next year. 5. Mammography 07/2016. Continue with annual mammography when due. SBE monthly reviewed. 6. Colonoscopy never. Strongly recommended patient schedule screening colonoscopy. Second most common cancer in women. Benefits of early detection her precancerous polyp removal. Patient acknowledges my recommendation. 7. Health maintenance. No routine lab work done as this is done at Dr. Judeen Hammans office. Follow up for pessary placement otherwise follow up in one year.   Visional time in excess of her routine gynecologic exam was spent in direct face to face counseling and coordination of care in regards to her cervical prolapse, treatment options and fitting for pessary.    Anastasio Auerbach MD, 2:58 PM 09/26/2016

## 2016-09-26 NOTE — Patient Instructions (Addendum)
Office will call you when the pessary is ready.  Schedule the bone density.  Schedule your colonoscopy with either:  [facility]   Address: [address], [place], [place] [address]  [phone]    or   [facility]  Address: [address], [place], [place] [address]  [phone]

## 2016-10-27 ENCOUNTER — Other Ambulatory Visit: Payer: Self-pay | Admitting: Gynecology

## 2016-10-27 DIAGNOSIS — M899 Disorder of bone, unspecified: Secondary | ICD-10-CM

## 2016-10-27 DIAGNOSIS — Z13820 Encounter for screening for osteoporosis: Secondary | ICD-10-CM

## 2016-11-01 ENCOUNTER — Encounter: Payer: Self-pay | Admitting: Gynecology

## 2016-11-01 ENCOUNTER — Ambulatory Visit (INDEPENDENT_AMBULATORY_CARE_PROVIDER_SITE_OTHER): Payer: BLUE CROSS/BLUE SHIELD | Admitting: Internal Medicine

## 2016-11-01 ENCOUNTER — Ambulatory Visit (INDEPENDENT_AMBULATORY_CARE_PROVIDER_SITE_OTHER): Payer: BLUE CROSS/BLUE SHIELD

## 2016-11-01 DIAGNOSIS — M899 Disorder of bone, unspecified: Secondary | ICD-10-CM | POA: Diagnosis not present

## 2016-11-01 DIAGNOSIS — Z6834 Body mass index (BMI) 34.0-34.9, adult: Secondary | ICD-10-CM | POA: Diagnosis not present

## 2016-11-01 DIAGNOSIS — E6609 Other obesity due to excess calories: Secondary | ICD-10-CM | POA: Diagnosis not present

## 2016-11-01 DIAGNOSIS — Z13820 Encounter for screening for osteoporosis: Secondary | ICD-10-CM

## 2016-11-01 MED ORDER — PHENTERMINE HCL 37.5 MG PO TABS: 37.5000 mg | ORAL_TABLET | Freq: Every day | ORAL | 2 refills | Status: DC

## 2016-11-01 NOTE — Progress Notes (Signed)
Pre visit review using our clinic review tool, if applicable. No additional management support is needed unless otherwise documented below in the visit note. 

## 2016-11-01 NOTE — Progress Notes (Signed)
Subjective:  Patient ID: Regina Roberts, female    DOB: 24-Dec-1950  Age: 66 y.o. MRN: [id number]  CC: No chief complaint on file.   HPI MARYANNE DIVITO presents for wt gain  Outpatient Medications Prior to Visit  Medication Sig Dispense Refill  . atorvastatin (LIPITOR) 10 MG tablet Take 1 tablet (10 mg total) by mouth daily. 90 tablet 3  . CALCIUM PO Take by mouth daily. OTC    . Cholecalciferol (VITAMIN D-3) 1000 UNITS CAPS Take 1 capsule by mouth daily.    . furosemide (LASIX) 20 MG tablet Take 1 tablet (20 mg total) by mouth daily as needed for edema. 30 tablet 5  . lisinopril-hydrochlorothiazide (ZESTORETIC) 20-12.5 MG tablet Take 1 tablet by mouth daily. 90 tablet 3  . Multiple Vitamins-Minerals (CENTRUM ADULTS) TABS Take 1 tablet by mouth daily.    . naproxen sodium (ANAPROX) 220 MG tablet Take 220 mg by mouth as needed.    . Omega-3 Fatty Acids (FISH OIL PO) Take by mouth.    . triamcinolone ointment (KENALOG) 0.5 % Apply 1 application topically 2 (two) times daily. 60 g 3  . phentermine (ADIPEX-P) 37.5 MG tablet Take 1 tablet (37.5 mg total) by mouth daily before breakfast. 30 tablet 2   No facility-administered medications prior to visit.     ROS Review of Systems  Constitutional: Negative for activity change, appetite change, chills, fatigue and unexpected weight change.  HENT: Negative for congestion, mouth sores and sinus pressure.   Eyes: Negative for visual disturbance.  Respiratory: Negative for cough and chest tightness.   Gastrointestinal: Negative for abdominal pain and nausea.  Genitourinary: Negative for difficulty urinating, frequency and vaginal pain.  Musculoskeletal: Negative for back pain and gait problem.  Skin: Negative for pallor and rash.  Neurological: Negative for dizziness, tremors, weakness, numbness and headaches.  Psychiatric/Behavioral: Negative for confusion and sleep disturbance.    Objective:  BP 118/78   Pulse 76   Wt 167 lb  (75.8 kg)   SpO2 97%   BMI 32.61 kg/m   BP Readings from Last 3 Encounters:  11/01/16 118/78  09/26/16 120/76  08/01/16 110/72    Wt Readings from Last 3 Encounters:  11/01/16 167 lb (75.8 kg)  09/26/16 167 lb (75.8 kg)  08/01/16 171 lb (77.6 kg)    Physical Exam  Constitutional: She appears well-developed. No distress.  HENT:  Head: Normocephalic.  Right Ear: External ear normal.  Left Ear: External ear normal.  Nose: Nose normal.  Mouth/Throat: Oropharynx is clear and moist.  Eyes: Conjunctivae are normal. Pupils are equal, round, and reactive to light. Right eye exhibits no discharge. Left eye exhibits no discharge.  Neck: Normal range of motion. Neck supple. No JVD present. No tracheal deviation present. No thyromegaly present.  Cardiovascular: Normal rate, regular rhythm and normal heart sounds.   Pulmonary/Chest: No stridor. No respiratory distress. She has no wheezes.  Abdominal: Soft. Bowel sounds are normal. She exhibits no distension and no mass. There is no tenderness. There is no rebound and no guarding.  Musculoskeletal: She exhibits no edema or tenderness.  Lymphadenopathy:    She has no cervical adenopathy.  Neurological: She displays normal reflexes. No cranial nerve deficit. She exhibits normal muscle tone. Coordination normal.  Skin: No rash noted. No erythema.  Psychiatric: She has a normal mood and affect. Her behavior is normal. Judgment and thought content normal.  obese  Lab Results  Component Value Date   WBC 13.1 (H) 06/02/2014  HGB 13.8 06/02/2014   HCT 41.5 06/02/2014   PLT 347.0 06/02/2014   GLUCOSE 114 (H) 06/02/2015   CHOL 169 06/02/2015   TRIG 199.0 (H) 06/02/2015   HDL 42.50 06/02/2015   LDLCALC 86 06/02/2015   ALT 14 06/02/2015   AST 11 06/02/2015   NA 140 06/02/2015   K 4.0 06/02/2015   CL 104 06/02/2015   CREATININE 1.04 06/02/2015   BUN 17 06/02/2015   CO2 30 06/02/2015   TSH 0.66 06/02/2015    Mm Digital Screening  Bilateral  Result Date: 08/04/2016 CLINICAL DATA:  Screening. EXAM: DIGITAL SCREENING BILATERAL MAMMOGRAM WITH CAD COMPARISON:  Previous exam(s). ACR Breast Density Category b: There are scattered areas of fibroglandular density. FINDINGS: There are no findings suspicious for malignancy. Images were processed with CAD. IMPRESSION: No mammographic evidence of malignancy. A result letter of this screening mammogram will be mailed directly to the patient. RECOMMENDATION: Screening mammogram in one year. (Code:SM-B-01Y) BI-RADS CATEGORY  1: Negative. Electronically Signed   By: Marin Olp M.D.   On: 08/04/2016 08:00    Assessment & Plan:   Diagnoses and all orders for this visit:  Class 1 obesity due to excess calories with body mass index (BMI) of 34.0 to 34.9 in adult, unspecified whether serious comorbidity present  Other orders -     phentermine (ADIPEX-P) 37.5 MG tablet; Take 1 tablet (37.5 mg total) by mouth daily before breakfast.   I am having Ms. Gillie maintain her CENTRUM ADULTS, Vitamin D-3, CALCIUM PO, naproxen sodium, lisinopril-hydrochlorothiazide, furosemide, atorvastatin, triamcinolone ointment, Omega-3 Fatty Acids (FISH OIL PO), and phentermine.  Meds ordered this encounter  Medications  . phentermine (ADIPEX-P) 37.5 MG tablet    Sig: Take 1 tablet (37.5 mg total) by mouth daily before breakfast.    Dispense:  30 tablet    Refill:  2     Follow-up: Return in about 3 months (around 01/30/2017) for a follow-up visit.  Walker Kehr, MD

## 2016-11-01 NOTE — Assessment & Plan Note (Signed)
Phentermine x 3 mo

## 2016-11-16 ENCOUNTER — Other Ambulatory Visit: Payer: Self-pay | Admitting: Internal Medicine

## 2016-11-17 NOTE — Telephone Encounter (Signed)
Done

## 2017-02-01 ENCOUNTER — Ambulatory Visit (INDEPENDENT_AMBULATORY_CARE_PROVIDER_SITE_OTHER): Payer: BLUE CROSS/BLUE SHIELD | Admitting: Internal Medicine

## 2017-02-01 ENCOUNTER — Encounter: Payer: Self-pay | Admitting: Internal Medicine

## 2017-02-01 DIAGNOSIS — M7989 Other specified soft tissue disorders: Secondary | ICD-10-CM

## 2017-02-01 DIAGNOSIS — E6609 Other obesity due to excess calories: Secondary | ICD-10-CM | POA: Diagnosis not present

## 2017-02-01 DIAGNOSIS — Z6834 Body mass index (BMI) 34.0-34.9, adult: Secondary | ICD-10-CM

## 2017-02-01 DIAGNOSIS — I7300 Raynaud's syndrome without gangrene: Secondary | ICD-10-CM | POA: Insufficient documentation

## 2017-02-01 MED ORDER — ATORVASTATIN CALCIUM 10 MG PO TABS: 10.0000 mg | ORAL_TABLET | Freq: Every day | ORAL | 3 refills | Status: DC

## 2017-02-01 MED ORDER — LISINOPRIL-HYDROCHLOROTHIAZIDE 20-12.5 MG PO TABS: 1.0000 | ORAL_TABLET | Freq: Every day | ORAL | 3 refills | Status: DC

## 2017-02-01 NOTE — Assessment & Plan Note (Signed)
[facility] D/c phentermine

## 2017-02-01 NOTE — Progress Notes (Signed)
Subjective:  Patient ID: Regina Roberts, female    DOB: Mar 14, 1951  Age: 66 y.o. MRN: [id number]  CC: No chief complaint on file.   HPI Regina Roberts presents for obesity f/uC/o 1-2 fingers would get "cold" with cold exposure x 6-12 mo  Outpatient Medications Prior to Visit  Medication Sig Dispense Refill  . atorvastatin (LIPITOR) 10 MG tablet Take 1 tablet (10 mg total) by mouth daily. 90 tablet 3  . CALCIUM PO Take by mouth daily. OTC    . Cholecalciferol (VITAMIN D-3) 1000 UNITS CAPS Take 1 capsule by mouth daily.    . furosemide (LASIX) 20 MG tablet Take 1 tablet (20 mg total) by mouth daily as needed for edema. 30 tablet 5  . lisinopril-hydrochlorothiazide (ZESTORETIC) 20-12.5 MG tablet Take 1 tablet by mouth daily. 90 tablet 3  . Multiple Vitamins-Minerals (CENTRUM ADULTS) TABS Take 1 tablet by mouth daily.    . naproxen sodium (ANAPROX) 220 MG tablet Take 220 mg by mouth as needed.    . Omega-3 Fatty Acids (FISH OIL PO) Take by mouth.    . phentermine (ADIPEX-P) 37.5 MG tablet take 1 tablet by mouth once daily BEFORE BREAKFAST 30 tablet 2  . triamcinolone ointment (KENALOG) 0.5 % Apply 1 application topically 2 (two) times daily. 60 g 3   No facility-administered medications prior to visit.     ROS Review of Systems  Constitutional: Negative for activity change, appetite change, chills, diaphoresis, fatigue, fever and unexpected weight change.  HENT: Negative for congestion, dental problem, ear pain, hearing loss, mouth sores, postnasal drip, sinus pressure, sneezing, sore throat and voice change.   Eyes: Negative for pain and visual disturbance.  Respiratory: Negative for cough, chest tightness, wheezing and stridor.   Cardiovascular: Negative for chest pain, palpitations and leg swelling.  Gastrointestinal: Negative for abdominal distention, abdominal pain, blood in stool, nausea, rectal pain and vomiting.  Genitourinary: Negative for decreased urine volume,  difficulty urinating, dysuria, frequency, hematuria, menstrual problem, vaginal bleeding, vaginal discharge and vaginal pain.  Musculoskeletal: Positive for arthralgias. Negative for back pain, gait problem, joint swelling and neck pain.  Skin: Negative for color change, rash and wound.  Neurological: Negative for dizziness, tremors, syncope, speech difficulty, weakness and light-headedness.  Hematological: Negative for adenopathy.  Psychiatric/Behavioral: Negative for behavioral problems, confusion, decreased concentration, dysphoric mood, hallucinations, sleep disturbance and suicidal ideas. The patient is not nervous/anxious and is not hyperactive.     Objective:  BP 138/90 (BP Location: Left Arm, Patient Position: Sitting, Cuff Size: Normal)   Pulse 98   Temp 98.8 F (37.1 C) (Oral)   Ht 5' (1.524 m)   Wt 168 lb (76.2 kg)   SpO2 98%   BMI 32.81 kg/m   BP Readings from Last 3 Encounters:  02/01/17 138/90  11/01/16 118/78  09/26/16 120/76    Wt Readings from Last 3 Encounters:  02/01/17 168 lb (76.2 kg)  11/01/16 167 lb (75.8 kg)  09/26/16 167 lb (75.8 kg)    Physical Exam  Constitutional: She appears well-developed. No distress.  HENT:  Head: Normocephalic.  Right Ear: External ear normal.  Left Ear: External ear normal.  Nose: Nose normal.  Mouth/Throat: Oropharynx is clear and moist.  Eyes: Conjunctivae are normal. Pupils are equal, round, and reactive to light. Right eye exhibits no discharge. Left eye exhibits no discharge.  Neck: Normal range of motion. Neck supple. No JVD present. No tracheal deviation present. No thyromegaly present.  Cardiovascular: Normal rate, regular rhythm  and normal heart sounds.   Pulmonary/Chest: No stridor. No respiratory distress. She has no wheezes.  Abdominal: Soft. Bowel sounds are normal. She exhibits no distension and no mass. There is no tenderness. There is no rebound and no guarding.  Musculoskeletal: She exhibits no edema or  tenderness.  Lymphadenopathy:    She has no cervical adenopathy.  Neurological: She displays normal reflexes. No cranial nerve deficit. She exhibits normal muscle tone. Coordination normal.  Skin: No rash noted. No erythema.  Psychiatric: She has a normal mood and affect. Her behavior is normal. Judgment and thought content normal.  Obese  Lab Results  Component Value Date   WBC 13.1 (H) 06/02/2014   HGB 13.8 06/02/2014   HCT 41.5 06/02/2014   PLT 347.0 06/02/2014   GLUCOSE 114 (H) 06/02/2015   CHOL 169 06/02/2015   TRIG 199.0 (H) 06/02/2015   HDL 42.50 06/02/2015   LDLCALC 86 06/02/2015   ALT 14 06/02/2015   AST 11 06/02/2015   NA 140 06/02/2015   K 4.0 06/02/2015   CL 104 06/02/2015   CREATININE 1.04 06/02/2015   BUN 17 06/02/2015   CO2 30 06/02/2015   TSH 0.66 06/02/2015    Mm Digital Screening Bilateral  Result Date: 08/01/2016 CLINICAL DATA:  Screening. EXAM: DIGITAL SCREENING BILATERAL MAMMOGRAM WITH CAD COMPARISON:  Previous exam(s). ACR Breast Density Category b: There are scattered areas of fibroglandular density. FINDINGS: There are no findings suspicious for malignancy. Images were processed with CAD. IMPRESSION: No mammographic evidence of malignancy. A result letter of this screening mammogram will be mailed directly to the patient. RECOMMENDATION: Screening mammogram in one year. (Code:SM-B-01Y) BI-RADS CATEGORY  1: Negative. Electronically Signed   By: Marin Olp M.D.   On: 08/04/2016 08:00    Assessment & Plan:   There are no diagnoses linked to this encounter. I am having Ms. Tamplin maintain her CENTRUM ADULTS, Vitamin D-3, CALCIUM PO, naproxen sodium, lisinopril-hydrochlorothiazide, furosemide, atorvastatin, triamcinolone ointment, Omega-3 Fatty Acids (FISH OIL PO), and phentermine.  No orders of the defined types were placed in this encounter.    Follow-up: No Follow-up on file.  Walker Kehr, MD

## 2017-02-01 NOTE — Assessment & Plan Note (Addendum)
D/c phentermine Work up if not better

## 2017-02-01 NOTE — Assessment & Plan Note (Signed)
Resolved

## 2017-02-01 NOTE — Progress Notes (Signed)
Pre visit review using our clinic review tool, if applicable. No additional management support is needed unless otherwise documented below in the visit note. 

## 2017-02-01 NOTE — Patient Instructions (Signed)
Raynaud Phenomenon Raynaud phenomenon is a condition that affects the blood vessels (arteries) that carry blood to your fingers and toes. The arteries that supply blood to your ears or the tip of your nose might also be affected. Raynaud phenomenon causes the arteries to temporarily narrow. As a result, the flow of blood to the affected areas is temporarily decreased. This usually occurs in response to cold temperatures or stress. During an attack, the skin in the affected areas turns white. You may also feel tingling or numbness in those areas. Attacks usually last for only a brief period, and then the blood flow to the area returns to normal. In most cases, Raynaud phenomenon does not cause serious health problems. What are the causes? For many people with this condition, the cause is not known. Raynaud phenomenon is sometimes associated with other diseases, such as scleroderma or lupus. What increases the risk? Raynaud phenomenon can affect anyone, but it develops most often in people who are 20-40 years old. It affects more females than males. What are the signs or symptoms? Symptoms of Raynaud phenomenon may occur when you are exposed to cold temperatures or when you have emotional stress. The symptoms may last for a few minutes or up to several hours. They usually affect your fingers but may also affect your toes, ears, or the tip of your nose. Symptoms may include:  Changes in skin color. The skin in the affected areas will turn pale or white. The skin may then change from white to bluish to red as normal blood flow returns to the area.  Numbness, tingling, or pain in the affected areas.  In severe cases, sores may develop in the affected areas. How is this diagnosed? Your health care provider will do a physical exam and take your medical history. You may be asked to put your hands in cold water to check for a reaction to cold temperature. Blood tests may be done to check for other diseases or  conditions. Your health care provider may also order a test to check the movement of blood through your arteries and veins (vascular ultrasound). How is this treated? Treatment often involves making lifestyle changes and taking steps to control your exposure to cold temperatures. For more severe cases, medicine (calcium channel blockers) may be used to improve blood flow. Surgery is sometimes done to block the nerves that control the affected arteries, but this is rare. Follow these instructions at home:  Avoid exposure to cold by taking these steps: ? If possible, stay indoors during cold weather. ? When you go outside during cold weather, dress in layers and wear mittens, a hat, a scarf, and warm footwear. ? Wear mittens or gloves when handling ice or frozen food. ? Use holders for glasses or cans containing cold drinks. ? Let warm water run for a while before taking a shower or bath. ? Warm up the car before driving in cold weather.  If possible, avoid stressful and emotional situations. Exercise, meditation, and yoga may help you cope with stress. Biofeedback may be useful.  Do not use any tobacco products, including cigarettes, chewing tobacco, or electronic cigarettes. If you need help quitting, ask your health care provider.  Avoid secondhand smoke.  Limit your use of caffeine. Switch to decaffeinated coffee, tea, and soda. Avoid chocolate.  Wear loose fitting socks and comfortable, roomy shoes.  Avoid vibrating tools and machinery.  Take medicines only as directed by your health care provider. Contact a health care provider if:    Your discomfort becomes worse despite lifestyle changes.  You develop sores on your fingers or toes that do not heal.  Your fingers or toes turn black.  You have breaks in the skin on your fingers or toes.  You have a fever.  You have pain or swelling in your joints.  You have a rash.  Your symptoms occur on only one side of your body. This  information is not intended to replace advice given to you by your health care provider. Make sure you discuss any questions you have with your health care provider. Document Released: 10/07/2000 Document Revised: 03/17/2016 Document Reviewed: 04/13/2016 Elsevier Interactive Patient Education  2017 [employer]  

## 2017-03-04 ENCOUNTER — Other Ambulatory Visit: Payer: Self-pay | Admitting: Internal Medicine

## 2017-05-16 ENCOUNTER — Ambulatory Visit (INDEPENDENT_AMBULATORY_CARE_PROVIDER_SITE_OTHER): Payer: Medicare Other | Admitting: Internal Medicine

## 2017-05-16 ENCOUNTER — Encounter: Payer: Self-pay | Admitting: Internal Medicine

## 2017-05-16 DIAGNOSIS — I7300 Raynaud's syndrome without gangrene: Secondary | ICD-10-CM

## 2017-05-16 DIAGNOSIS — G5622 Lesion of ulnar nerve, left upper limb: Secondary | ICD-10-CM

## 2017-05-16 DIAGNOSIS — Z6834 Body mass index (BMI) 34.0-34.9, adult: Secondary | ICD-10-CM

## 2017-05-16 DIAGNOSIS — I10 Essential (primary) hypertension: Secondary | ICD-10-CM | POA: Diagnosis not present

## 2017-05-16 DIAGNOSIS — G5620 Lesion of ulnar nerve, unspecified upper limb: Secondary | ICD-10-CM | POA: Insufficient documentation

## 2017-05-16 DIAGNOSIS — E6609 Other obesity due to excess calories: Secondary | ICD-10-CM | POA: Diagnosis not present

## 2017-05-16 MED ORDER — B COMPLEX PO TABS: 1.0000 | ORAL_TABLET | Freq: Every day | ORAL | 3 refills | Status: AC

## 2017-05-16 MED ORDER — PHENTERMINE HCL 37.5 MG PO TABS: 37.5000 mg | ORAL_TABLET | Freq: Every day | ORAL | 2 refills | Status: DC

## 2017-05-16 NOTE — Assessment & Plan Note (Signed)
Lisinopril-HCT 

## 2017-05-16 NOTE — Assessment & Plan Note (Signed)
Resolved

## 2017-05-16 NOTE — Assessment & Plan Note (Signed)
Ortho ref offered Vit B complex Avoid compression

## 2017-05-16 NOTE — Assessment & Plan Note (Addendum)
Pt wants to re-new Phentermine - done  Potential benefits of Adipex use as well as potential risks  and complications were explained to the patient and were aknowledged.

## 2017-05-16 NOTE — Progress Notes (Signed)
Subjective:  Patient ID: Regina Roberts, female    DOB: 08-15-1951  Age: 66 y.o. MRN: [id number]  CC: No chief complaint on file.   HPI Regina Roberts presents for L hand 2 fingers #4-5 go numb at night. "Cold" fingers sensation has resolved. R handed F/u obesity, HTN  Outpatient Medications Prior to Visit  Medication Sig Dispense Refill  . atorvastatin (LIPITOR) 10 MG tablet Take 1 tablet (10 mg total) by mouth daily. 90 tablet 3  . CALCIUM PO Take by mouth daily. OTC    . Cholecalciferol (VITAMIN D-3) 1000 UNITS CAPS Take 1 capsule by mouth daily.    . furosemide (LASIX) 20 MG tablet Take 1 tablet (20 mg total) by mouth daily as needed for edema. 30 tablet 5  . lisinopril-hydrochlorothiazide (ZESTORETIC) 20-12.5 MG tablet Take 1 tablet by mouth daily. 90 tablet 3  . Multiple Vitamins-Minerals (CENTRUM ADULTS) TABS Take 1 tablet by mouth daily.    . naproxen sodium (ANAPROX) 220 MG tablet Take 220 mg by mouth as needed.    . Omega-3 Fatty Acids (FISH OIL PO) Take by mouth.    . triamcinolone ointment (KENALOG) 0.5 % Apply 1 application topically 2 (two) times daily. 60 g 3   No facility-administered medications prior to visit.     ROS Review of Systems  Constitutional: Negative for activity change, appetite change, chills, fatigue and unexpected weight change.  HENT: Negative for congestion, mouth sores and sinus pressure.   Eyes: Negative for visual disturbance.  Respiratory: Negative for cough and chest tightness.   Gastrointestinal: Negative for abdominal pain and nausea.  Genitourinary: Negative for difficulty urinating, frequency and vaginal pain.  Musculoskeletal: Negative for back pain and gait problem.  Skin: Negative for pallor and rash.  Neurological: Negative for dizziness, tremors, weakness, numbness and headaches.  Psychiatric/Behavioral: Negative for confusion and sleep disturbance.    Objective:  BP 126/80 (BP Location: Left Arm, Patient Position:  Sitting, Cuff Size: Large)   Pulse (!) 101   Temp 98.6 F (37 C) (Oral)   Ht 5' (1.524 m)   Wt 169 lb (76.7 kg)   SpO2 98%   BMI 33.01 kg/m   BP Readings from Last 3 Encounters:  05/16/17 126/80  02/01/17 138/90  11/01/16 118/78    Wt Readings from Last 3 Encounters:  05/16/17 169 lb (76.7 kg)  02/01/17 168 lb (76.2 kg)  11/01/16 167 lb (75.8 kg)    Physical Exam  Constitutional: She appears well-developed. No distress.  HENT:  Head: Normocephalic.  Right Ear: External ear normal.  Left Ear: External ear normal.  Nose: Nose normal.  Mouth/Throat: Oropharynx is clear and moist.  Eyes: Pupils are equal, round, and reactive to light. Conjunctivae are normal. Right eye exhibits no discharge. Left eye exhibits no discharge.  Neck: Normal range of motion. Neck supple. No JVD present. No tracheal deviation present. No thyromegaly present.  Cardiovascular: Normal rate, regular rhythm and normal heart sounds.   Pulmonary/Chest: No stridor. No respiratory distress. She has no wheezes.  Abdominal: Soft. Bowel sounds are normal. She exhibits no distension and no mass. There is no tenderness. There is no rebound and no guarding.  Musculoskeletal: She exhibits no edema or tenderness.  Lymphadenopathy:    She has no cervical adenopathy.  Neurological: She displays normal reflexes. No cranial nerve deficit. She exhibits normal muscle tone. Coordination normal.  Skin: No rash noted. No erythema.  Psychiatric: She has a normal mood and affect. Her behavior is  normal. Judgment and thought content normal.    Lab Results  Component Value Date   WBC 13.1 (H) 06/02/2014   HGB 13.8 06/02/2014   HCT 41.5 06/02/2014   PLT 347.0 06/02/2014   GLUCOSE 114 (H) 06/02/2015   CHOL 169 06/02/2015   TRIG 199.0 (H) 06/02/2015   HDL 42.50 06/02/2015   LDLCALC 86 06/02/2015   ALT 14 06/02/2015   AST 11 06/02/2015   NA 140 06/02/2015   K 4.0 06/02/2015   CL 104 06/02/2015   CREATININE 1.04  06/02/2015   BUN 17 06/02/2015   CO2 30 06/02/2015   TSH 0.66 06/02/2015    Mm Digital Screening Bilateral  Result Date: 08/01/2016 CLINICAL DATA:  Screening. EXAM: DIGITAL SCREENING BILATERAL MAMMOGRAM WITH CAD COMPARISON:  Previous exam(s). ACR Breast Density Category b: There are scattered areas of fibroglandular density. FINDINGS: There are no findings suspicious for malignancy. Images were processed with CAD. IMPRESSION: No mammographic evidence of malignancy. A result letter of this screening mammogram will be mailed directly to the patient. RECOMMENDATION: Screening mammogram in one year. (Code:SM-B-01Y) BI-RADS CATEGORY  1: Negative. Electronically Signed   By: Marin Olp M.D.   On: 08/04/2016 08:00    Assessment & Plan:   There are no diagnoses linked to this encounter. I am having Regina Roberts maintain her CENTRUM ADULTS, Vitamin D-3, CALCIUM PO, naproxen sodium, furosemide, triamcinolone ointment, Omega-3 Fatty Acids (FISH OIL PO), lisinopril-hydrochlorothiazide, atorvastatin, and phentermine.  Meds ordered this encounter  Medications  . phentermine (ADIPEX-P) 37.5 MG tablet    Refill:  0     Follow-up: No Follow-up on file.  Walker Kehr, MD

## 2017-06-08 ENCOUNTER — Telehealth: Payer: Self-pay | Admitting: Internal Medicine

## 2017-06-08 DIAGNOSIS — Z1211 Encounter for screening for malignant neoplasm of colon: Secondary | ICD-10-CM

## 2017-06-08 NOTE — Telephone Encounter (Signed)
Done. Thx.

## 2017-06-08 NOTE — Telephone Encounter (Signed)
Pt called and would like to have a referral to get a colonscopy .  She as due in 2003

## 2017-06-08 NOTE — Telephone Encounter (Signed)
Routing to dr plotnikov, please advise, thanks 

## 2017-06-09 NOTE — Telephone Encounter (Signed)
Advised patient that referral has been placed, that dept should be calling her to schedule appt

## 2017-06-20 ENCOUNTER — Encounter: Payer: Self-pay | Admitting: Internal Medicine

## 2017-07-20 ENCOUNTER — Encounter: Payer: Self-pay | Admitting: Internal Medicine

## 2017-07-20 ENCOUNTER — Ambulatory Visit (AMBULATORY_SURGERY_CENTER): Payer: Self-pay

## 2017-07-20 VITALS — Ht 60.0 in | Wt 172.7 lb

## 2017-07-20 DIAGNOSIS — Z1211 Encounter for screening for malignant neoplasm of colon: Secondary | ICD-10-CM

## 2017-07-20 MED ORDER — NA SULFATE-K SULFATE-MG SULF 17.5-3.13-1.6 GM/177ML PO SOLN: 1.0000 | Freq: Once | ORAL | 0 refills | Status: AC

## 2017-07-20 NOTE — Progress Notes (Signed)
Denies allergies to eggs or soy products. Denies complication of anesthesia or sedation. Denies use of weight loss medication. Denies use of O2.   Emmi instructions declined. Does not have a computer. 

## 2017-08-03 ENCOUNTER — Ambulatory Visit (AMBULATORY_SURGERY_CENTER): Payer: Medicare Other | Admitting: Internal Medicine

## 2017-08-03 ENCOUNTER — Encounter: Payer: Self-pay | Admitting: Internal Medicine

## 2017-08-03 VITALS — BP 134/81 | HR 93 | Temp 98.4°F | Resp 17 | Ht 60.0 in | Wt 169.0 lb

## 2017-08-03 DIAGNOSIS — Z1211 Encounter for screening for malignant neoplasm of colon: Secondary | ICD-10-CM | POA: Diagnosis present

## 2017-08-03 DIAGNOSIS — D3615 Benign neoplasm of peripheral nerves and autonomic nervous system of abdomen: Secondary | ICD-10-CM | POA: Diagnosis not present

## 2017-08-03 DIAGNOSIS — K6389 Other specified diseases of intestine: Secondary | ICD-10-CM | POA: Diagnosis not present

## 2017-08-03 DIAGNOSIS — D125 Benign neoplasm of sigmoid colon: Secondary | ICD-10-CM

## 2017-08-03 MED ORDER — SODIUM CHLORIDE 0.9 % IV SOLN: 500.0000 mL | INTRAVENOUS | Status: DC

## 2017-08-03 NOTE — Op Note (Signed)
[facility] Patient Name: Regina Roberts Procedure Date: 08/03/2017 2:03 PM MRN: [id number] Endoscopist: Jerene Bears , MD Age: 66 Referring MD:  Date of Birth: May 29, 1951 Gender: Female Account #: [id number] Procedure:                Colonoscopy Indications:              Screening for colorectal malignant neoplasm, This                            is the patient's first colonoscopy Medicines:                Monitored Anesthesia Care Procedure:                Pre-Anesthesia Assessment:                           - Prior to the procedure, a History and Physical                            was performed, and patient medications and                            allergies were reviewed. The patient's tolerance of                            previous anesthesia was also reviewed. The risks                            and benefits of the procedure and the sedation                            options and risks were discussed with the patient.                            All questions were answered, and informed consent                            was obtained. Prior Anticoagulants: The patient has                            taken no previous anticoagulant or antiplatelet                            agents. ASA Grade Assessment: II - A patient with                            mild systemic disease. After reviewing the risks                            and benefits, the patient was deemed in                            satisfactory condition to undergo the procedure.  After obtaining informed consent, the colonoscope                            was passed under direct vision. Throughout the                            procedure, the patient's blood pressure, pulse, and                            oxygen saturations were monitored continuously. The                            Colonoscope was introduced through the anus and                            advanced to the the  cecum, identified by                            appendiceal orifice and ileocecal valve. The                            colonoscopy was performed without difficulty. The                            patient tolerated the procedure well. The quality                            of the bowel preparation was good. The ileocecal                            valve, appendiceal orifice, and rectum were                            photographed. Scope In: 2:12:46 PM Scope Out: 2:31:43 PM Scope Withdrawal Time: 0 hours 9 minutes 43 seconds  Total Procedure Duration: 0 hours 18 minutes 57 seconds  Findings:                 The digital rectal exam was normal. Small external                            hemorrhoid.                           A 5 mm polyp was found in the sigmoid colon. The                            polyp was sessile. The polyp was removed with a                            cold snare. Resection and retrieval were complete.                           Multiple small-mouthed diverticula were found in  the sigmoid colon and ascending colon. There was                            narrowing of the colon in association with the                            diverticular opening.                           The retroflexed view of the distal rectum and anal                            verge was normal and showed no anal or rectal                            abnormalities. Complications:            No immediate complications. Estimated Blood Loss:     Estimated blood loss was minimal. Impression:               - One 5 mm polyp in the sigmoid colon, removed with                            a cold snare. Resected and retrieved.                           - Mild diverticulosis in the sigmoid colon and in                            the ascending colon. There was narrowing of the                            colon in association with the diverticular opening.                           - The  distal rectum and anal verge are normal on                            retroflexion view. Recommendation:           - Patient has a contact number available for                            emergencies. The signs and symptoms of potential                            delayed complications were discussed with the                            patient. Return to normal activities tomorrow.                            Written discharge instructions were provided to the  patient.                           - Resume previous diet.                           - Continue present medications.                           - Await pathology results.                           - Repeat colonoscopy is recommended. The                            colonoscopy date will be determined after pathology                            results from today's exam become available for                            review. Jerene Bears, MD 08/03/2017 2:36:14 PM This report has been signed electronically.

## 2017-08-03 NOTE — Progress Notes (Signed)
A/ox3 pleased with MAC, report to Jane RN 

## 2017-08-03 NOTE — Patient Instructions (Addendum)
Impression/Recommendations:  Polyp handout given to patient. Diverticulosis handout given to patient.  Resume previous diet. Continue present medications. Await pathology results.  Repeat colonoscopy recommended.  Date to be determined after pathology results reviewed.  YOU HAD AN ENDOSCOPIC PROCEDURE TODAY AT [facility]:   Refer to the procedure report that was given to you for any specific questions about what was found during the examination.  If the procedure report does not answer your questions, please call your gastroenterologist to clarify.  If you requested that your care partner not be given the details of your procedure findings, then the procedure report has been included in a sealed envelope for you to review at your convenience later.  YOU SHOULD EXPECT: Some feelings of bloating in the abdomen. Passage of more gas than usual.  Walking can help get rid of the air that was put into your GI tract during the procedure and reduce the bloating. If you had a lower endoscopy (such as a colonoscopy or flexible sigmoidoscopy) you may notice spotting of blood in your stool or on the toilet paper. If you underwent a bowel prep for your procedure, you may not have a normal bowel movement for a few days.  Please Note:  You might notice some irritation and congestion in your nose or some drainage.  This is from the oxygen used during your procedure.  There is no need for concern and it should clear up in a day or so.  SYMPTOMS TO REPORT IMMEDIATELY:   Following lower endoscopy (colonoscopy or flexible sigmoidoscopy):  Excessive amounts of blood in the stool  Significant tenderness or worsening of abdominal pains  Swelling of the abdomen that is new, acute  Fever of 100F or higher For urgent or emergent issues, a gastroenterologist can be reached at any hour by calling [phone].   DIET:  We do recommend a small meal at first, but then you may proceed to your  regular diet.  Drink plenty of fluids but you should avoid alcoholic beverages for 24 hours.  ACTIVITY:  You should plan to take it easy for the rest of today and you should NOT DRIVE or use heavy machinery until tomorrow (because of the sedation medicines used during the test).    FOLLOW UP: Our staff will call the number listed on your records the next business day following your procedure to check on you and address any questions or concerns that you may have regarding the information given to you following your procedure. If we do not reach you, we will leave a message.  However, if you are feeling well and you are not experiencing any problems, there is no need to return our call.  We will assume that you have returned to your regular daily activities without incident.  If any biopsies were taken you will be contacted by phone or by letter within the next 1-3 weeks.  Please call us at [phone] if you have not heard about the biopsies in 3 weeks.    SIGNATURES/CONFIDENTIALITY: You and/or your care partner have signed paperwork which will be entered into your electronic medical record.  These signatures attest to the fact that that the information above on your After Visit Summary has been reviewed and is understood.  Full responsibility of the confidentiality of this discharge information lies with you and/or your care-partner.

## 2017-08-03 NOTE — Progress Notes (Signed)
Pt's states no medical or surgical changes since previsit or office visit. 

## 2017-08-03 NOTE — Progress Notes (Signed)
Called to room to assist during endoscopic procedure.  Patient ID and intended procedure confirmed with present staff. Received instructions for my participation in the procedure from the performing physician.  

## 2017-08-03 NOTE — Progress Notes (Signed)
Pt. Grimacing, abdomen firm.  Pt. Turning on right side, Trendelenberg, Levsin 0. 25 sublingual administered.   Pt. Than on hands and knees,  Air slowly began to pass.   After several minutes the IV was D/C'd, pt. Walked to restroom,  [place] with pt. around the unit several times, than pt. Got on hands and knees again.  Home instructions to manage bloating - walking, drinking a hot drink, and resuming hands and knees position to manage further discomfort.  Dr. Hilarie Fredrickson checked on pt. Several times.   Pt. Much more comfortable when discharged.

## 2017-08-04 ENCOUNTER — Telehealth: Payer: Self-pay

## 2017-08-04 NOTE — Telephone Encounter (Signed)
Attempted to reach patient for post-procedure f/u call. No answer or option to leave message.

## 2017-08-07 ENCOUNTER — Telehealth: Payer: Self-pay | Admitting: *Deleted

## 2017-08-07 ENCOUNTER — Ambulatory Visit: Payer: Medicare Other

## 2017-08-07 NOTE — Telephone Encounter (Signed)
  Follow up Call-  Call back number 08/03/2017  Post procedure Call Back phone  # [phone]  Permission to leave phone message No  Some recent data might be hidden   no answer and no machine; not able to leave a message

## 2017-08-08 ENCOUNTER — Encounter: Payer: Self-pay | Admitting: Internal Medicine

## 2017-08-14 ENCOUNTER — Other Ambulatory Visit: Payer: Self-pay | Admitting: Gynecology

## 2017-08-14 ENCOUNTER — Ambulatory Visit
Admission: RE | Admit: 2017-08-14 | Discharge: 2017-08-14 | Disposition: A | Discharge status: Home / Self Care | Payer: Medicare Other | Source: Ambulatory Visit | Attending: Gynecology | Admitting: Gynecology

## 2017-08-14 DIAGNOSIS — Z1231 Encounter for screening mammogram for malignant neoplasm of breast: Secondary | ICD-10-CM | POA: Diagnosis not present

## 2017-08-14 DIAGNOSIS — Z0000 Encounter for general adult medical examination without abnormal findings: Secondary | ICD-10-CM

## 2017-08-17 ENCOUNTER — Ambulatory Visit (INDEPENDENT_AMBULATORY_CARE_PROVIDER_SITE_OTHER): Payer: Medicare Other | Admitting: Internal Medicine

## 2017-08-17 ENCOUNTER — Encounter: Payer: Self-pay | Admitting: Internal Medicine

## 2017-08-17 VITALS — BP 124/72 | HR 82 | Temp 97.8°F | Ht 61.0 in | Wt 167.0 lb

## 2017-08-17 DIAGNOSIS — Z6834 Body mass index (BMI) 34.0-34.9, adult: Secondary | ICD-10-CM

## 2017-08-17 DIAGNOSIS — E6609 Other obesity due to excess calories: Secondary | ICD-10-CM

## 2017-08-17 DIAGNOSIS — Z0000 Encounter for general adult medical examination without abnormal findings: Secondary | ICD-10-CM | POA: Diagnosis not present

## 2017-08-17 DIAGNOSIS — I10 Essential (primary) hypertension: Secondary | ICD-10-CM | POA: Diagnosis not present

## 2017-08-17 DIAGNOSIS — R600 Localized edema: Secondary | ICD-10-CM | POA: Diagnosis not present

## 2017-08-17 DIAGNOSIS — G5622 Lesion of ulnar nerve, left upper limb: Secondary | ICD-10-CM | POA: Diagnosis not present

## 2017-08-17 NOTE — Assessment & Plan Note (Signed)
Much better 

## 2017-08-17 NOTE — Progress Notes (Signed)
Subjective:  Patient ID: Regina Roberts, female    DOB: May 03, 1951  Age: 66 y.o. MRN: [id number]  CC: Follow-up (3 month-discuss weight loss)   HPI Regina Roberts presents for paresthesia on the L hand - much better F/u HTN, obesity  Outpatient Medications Prior to Visit  Medication Sig Dispense Refill  . atorvastatin (LIPITOR) 10 MG tablet Take 1 tablet (10 mg total) by mouth daily. 90 tablet 3  . b complex vitamins tablet Take 1 tablet by mouth daily. 100 tablet 3  . CALCIUM PO Take by mouth daily. OTC    . Cholecalciferol (VITAMIN D-3) 1000 UNITS CAPS Take 1 capsule by mouth daily.    Marland Kitchen lisinopril-hydrochlorothiazide (ZESTORETIC) 20-12.5 MG tablet Take 1 tablet by mouth daily. 90 tablet 3  . Multiple Vitamins-Minerals (CENTRUM ADULTS) TABS Take 1 tablet by mouth daily.    . naproxen sodium (ANAPROX) 220 MG tablet Take 220 mg by mouth as needed.    . Omega-3 Fatty Acids (FISH OIL PO) Take by mouth.    . phentermine (ADIPEX-P) 37.5 MG tablet Take 1 tablet (37.5 mg total) by mouth daily before breakfast. 30 tablet 2  . triamcinolone ointment (KENALOG) 0.5 % Apply 1 application topically 2 (two) times daily. 60 g 3   No facility-administered medications prior to visit.     ROS Review of Systems  Constitutional: Negative for activity change, appetite change, chills, fatigue and unexpected weight change.  HENT: Negative for congestion, mouth sores and sinus pressure.   Eyes: Negative for visual disturbance.  Respiratory: Negative for cough and chest tightness.   Gastrointestinal: Negative for abdominal pain and nausea.  Genitourinary: Negative for difficulty urinating, frequency and vaginal pain.  Musculoskeletal: Negative for back pain and gait problem.  Skin: Negative for pallor and rash.  Neurological: Negative for dizziness, tremors, weakness, numbness and headaches.  Psychiatric/Behavioral: Negative for confusion, sleep disturbance and suicidal ideas.    Objective:   BP 124/72 (BP Location: Left Arm, Patient Position: Sitting, Cuff Size: Large)   Pulse 82   Temp 97.8 F (36.6 C) (Oral)   Ht 5\' 1"  (1.549 m)   Wt 167 lb (75.8 kg)   SpO2 99%   BMI 31.55 kg/m   BP Readings from Last 3 Encounters:  08/17/17 124/72  08/03/17 134/81  05/16/17 126/80    Wt Readings from Last 3 Encounters:  08/17/17 167 lb (75.8 kg)  08/03/17 169 lb (76.7 kg)  07/20/17 172 lb 11.2 oz (78.3 kg)    Physical Exam  Constitutional: She appears well-developed. No distress.  HENT:  Head: Normocephalic.  Right Ear: External ear normal.  Left Ear: External ear normal.  Nose: Nose normal.  Mouth/Throat: Oropharynx is clear and moist.  Eyes: Pupils are equal, round, and reactive to light. Conjunctivae are normal. Right eye exhibits no discharge. Left eye exhibits no discharge.  Neck: Normal range of motion. Neck supple. No JVD present. No tracheal deviation present. No thyromegaly present.  Cardiovascular: Normal rate, regular rhythm and normal heart sounds.   Pulmonary/Chest: No stridor. No respiratory distress. She has no wheezes.  Abdominal: Soft. Bowel sounds are normal. She exhibits no distension and no mass. There is no tenderness. There is no rebound and no guarding.  Musculoskeletal: She exhibits no edema or tenderness.  Lymphadenopathy:    She has no cervical adenopathy.  Neurological: She displays normal reflexes. No cranial nerve deficit. She exhibits normal muscle tone. Coordination normal.  Skin: No rash noted. No erythema.  Psychiatric: She  has a normal mood and affect. Her behavior is normal. Judgment and thought content normal.    Lab Results  Component Value Date   WBC 13.1 (H) 06/02/2014   HGB 13.8 06/02/2014   HCT 41.5 06/02/2014   PLT 347.0 06/02/2014   GLUCOSE 114 (H) 06/02/2015   CHOL 169 06/02/2015   TRIG 199.0 (H) 06/02/2015   HDL 42.50 06/02/2015   LDLCALC 86 06/02/2015   ALT 14 06/02/2015   AST 11 06/02/2015   NA 140 06/02/2015   K  4.0 06/02/2015   CL 104 06/02/2015   CREATININE 1.04 06/02/2015   BUN 17 06/02/2015   CO2 30 06/02/2015   TSH 0.66 06/02/2015    Mm Digital Screening Bilateral  Result Date: 08/14/2017 CLINICAL DATA:  Screening. EXAM: DIGITAL SCREENING BILATERAL MAMMOGRAM WITH CAD COMPARISON:  Previous exam(s). ACR Breast Density Category b: There are scattered areas of fibroglandular density. FINDINGS: There are no findings suspicious for malignancy. Images were processed with CAD. IMPRESSION: No mammographic evidence of malignancy. A result letter of this screening mammogram will be mailed directly to the patient. RECOMMENDATION: Screening mammogram in one year. (Code:SM-B-01Y) BI-RADS CATEGORY  2: Benign. Electronically Signed   By: Evangeline Dakin M.D.   On: 08/14/2017 16:23    Assessment & Plan:   There are no diagnoses linked to this encounter. I am having Regina Roberts maintain her CENTRUM ADULTS, Vitamin D-3, CALCIUM PO, naproxen sodium, triamcinolone ointment, Omega-3 Fatty Acids (FISH OIL PO), lisinopril-hydrochlorothiazide, atorvastatin, b complex vitamins, and phentermine.  No orders of the defined types were placed in this encounter.    Follow-up: No Follow-up on file.  Walker Kehr, MD

## 2017-08-17 NOTE — Assessment & Plan Note (Signed)
BP Readings from Last 3 Encounters:  08/17/17 124/72  08/03/17 134/81  05/16/17 126/80  Lisinopr HCT

## 2017-08-17 NOTE — Assessment & Plan Note (Signed)
No relapse 

## 2017-08-17 NOTE — Assessment & Plan Note (Signed)
Better on phentermine

## 2017-08-17 NOTE — Assessment & Plan Note (Signed)
No relapse yet Will watch

## 2017-09-07 ENCOUNTER — Other Ambulatory Visit: Payer: Self-pay | Admitting: Internal Medicine

## 2017-09-15 ENCOUNTER — Other Ambulatory Visit (INDEPENDENT_AMBULATORY_CARE_PROVIDER_SITE_OTHER): Payer: Medicare Other

## 2017-09-15 DIAGNOSIS — Z0000 Encounter for general adult medical examination without abnormal findings: Secondary | ICD-10-CM

## 2017-09-15 LAB — CBC WITH DIFFERENTIAL/PLATELET
Basophils Absolute: 0.1 10*3/uL (ref 0.0–0.1)
Basophils Relative: 0.4 % (ref 0.0–3.0)
Eosinophils Absolute: 0.2 10*3/uL (ref 0.0–0.7)
Eosinophils Relative: 1.5 % (ref 0.0–5.0)
HCT: 39.5 % (ref 36.0–46.0)
Hemoglobin: 13 g/dL (ref 12.0–15.0)
Lymphocytes Relative: 27.5 % (ref 12.0–46.0)
Lymphs Abs: 3.6 10*3/uL (ref 0.7–4.0)
MCHC: 32.9 g/dL (ref 30.0–36.0)
MCV: 88.2 fl (ref 78.0–100.0)
Monocytes Absolute: 1.2 10*3/uL — ABNORMAL HIGH (ref 0.1–1.0)
Monocytes Relative: 9.1 % (ref 3.0–12.0)
Neutro Abs: 8 10*3/uL — ABNORMAL HIGH (ref 1.4–7.7)
Neutrophils Relative %: 61.5 % (ref 43.0–77.0)
Platelets: 400 10*3/uL (ref 150.0–400.0)
RBC: 4.48 Mil/uL (ref 3.87–5.11)
RDW: 14.1 % (ref 11.5–15.5)
WBC: 13 10*3/uL — ABNORMAL HIGH (ref 4.0–10.5)

## 2017-09-15 LAB — BASIC METABOLIC PANEL
BUN: 14 mg/dL (ref 6–23)
CO2: 31 mEq/L (ref 19–32)
Calcium: 10.4 mg/dL (ref 8.4–10.5)
Chloride: 101 mEq/L (ref 96–112)
Creatinine, Ser: 0.99 mg/dL (ref 0.40–1.20)
GFR: 72.28 mL/min (ref >60.00)
Glucose, Bld: 109 mg/dL — ABNORMAL HIGH (ref 70–99)
Potassium: 3.9 mEq/L (ref 3.5–5.1)
Sodium: 138 mEq/L (ref 135–145)

## 2017-09-15 LAB — HEPATIC FUNCTION PANEL
ALT: 15 U/L (ref 0–35)
AST: 10 U/L (ref 0–37)
Albumin: 4.1 g/dL (ref 3.5–5.2)
Alkaline Phosphatase: 56 U/L (ref 39–117)
Bilirubin, Direct: 0 mg/dL (ref 0.0–0.3)
Total Bilirubin: 0.6 mg/dL (ref 0.2–1.2)
Total Protein: 7.5 g/dL (ref 6.0–8.3)

## 2017-09-15 LAB — LIPID PANEL
Cholesterol: 165 mg/dL (ref 0–200)
HDL: 44 mg/dL (ref >39.00)
LDL Cholesterol: 102 mg/dL — ABNORMAL HIGH (ref 0–99)
NonHDL: 121.48
Total CHOL/HDL Ratio: 4
Triglycerides: 97 mg/dL (ref 0.0–149.0)
VLDL: 19.4 mg/dL (ref 0.0–40.0)

## 2017-09-15 LAB — URINALYSIS, ROUTINE W REFLEX MICROSCOPIC
Bilirubin Urine: NEGATIVE
Ketones, ur: NEGATIVE
Nitrite: NEGATIVE
Specific Gravity, Urine: 1.02 (ref 1.000–1.030)
Total Protein, Urine: NEGATIVE
Urine Glucose: NEGATIVE
Urobilinogen, UA: 0.2 (ref 0.0–1.0)
pH: 6 (ref 5.0–8.0)

## 2017-09-15 LAB — TSH: TSH: 1.07 u[IU]/mL (ref 0.35–4.50)

## 2017-09-26 ENCOUNTER — Other Ambulatory Visit: Payer: Self-pay | Admitting: Internal Medicine

## 2017-09-27 ENCOUNTER — Ambulatory Visit (INDEPENDENT_AMBULATORY_CARE_PROVIDER_SITE_OTHER): Payer: Medicare Other | Admitting: Gynecology

## 2017-09-27 ENCOUNTER — Encounter: Payer: Self-pay | Admitting: Gynecology

## 2017-09-27 VITALS — BP 118/78 | Ht 59.5 in | Wt 172.0 lb

## 2017-09-27 DIAGNOSIS — N812 Incomplete uterovaginal prolapse: Secondary | ICD-10-CM

## 2017-09-27 DIAGNOSIS — N952 Postmenopausal atrophic vaginitis: Secondary | ICD-10-CM | POA: Diagnosis not present

## 2017-09-27 DIAGNOSIS — N814 Uterovaginal prolapse, unspecified: Secondary | ICD-10-CM

## 2017-09-27 DIAGNOSIS — Z01411 Encounter for gynecological examination (general) (routine) with abnormal findings: Secondary | ICD-10-CM

## 2017-09-27 DIAGNOSIS — M8580 Other specified disorders of bone density and structure, unspecified site: Secondary | ICD-10-CM | POA: Diagnosis not present

## 2017-09-27 NOTE — Progress Notes (Signed)
    Regina Roberts [birth year] [id number]        66 y.o.  G2P0011 for annual gynecologic exam.  History of supracervical hysterectomy in the past for benign indications.  Has had an issue with cervical prolapse which became more symptomatic and she was fitted for a pessary last year but did not follow-up to have it placed.  We have it in stock for her.  Notes that her prolapse continues to be an issue with something protruding from the vagina.  Past medical history,surgical history, problem list, medications, allergies, family history and social history were all reviewed and documented as reviewed in the EPIC chart.  ROS:  Performed with pertinent positives and negatives included in the history, assessment and plan.   Additional significant findings : None   Exam: [name] Vitals:   09/27/17 1530  BP: 118/78  Weight: 172 lb (78 kg)  Height: 4' 11.5" (1.511 m)   Body mass index is 34.16 kg/m.  General appearance:  Normal affect, orientation and appearance. Skin: Grossly normal HEENT: Without gross lesions.  No cervical or supraclavicular adenopathy. Thyroid normal.  Lungs:  Clear without wheezing, rales or rhonchi Cardiac: RR, without RMG Abdominal:  Soft, nontender, without masses, guarding, rebound, organomegaly or hernia Breasts:  Examined lying and sitting without masses, retractions, discharge or axillary adenopathy. Pelvic:  Ext, BUS, Vagina: With atrophic changes.  Second-degree cervical prolapse noted with cervix protruding from the introitus.   Cervix: With atrophic changes.  Pap smear done  Adnexa: Without masses or tenderness    Anus and perineum: Normal   Rectovaginal: Normal sphincter tone without palpated masses or tenderness.    Assessment/Plan:  66 y.o. G80P0011 female for annual gynecologic exam.  Status post supracervical hysterectomy.  1. Postmenopausal/atrophic genital changes.  Without significant hot flushes, night sweats or vaginal dryness  or any vaginal bleeding. 2. Cervical prolapse.  Status post supracervical hysterectomy.  Has had cervical prolapse for a while but now has become significantly symptomatic to her.  Was fitted for a ring pessary with support last year but did not follow-up for placement.  Ring pessary was placed today.  Patient ambulated and did well with this.  She will follow-up in 1 month for pessary recheck.  She is not sexually active at this time but may become so in the future.  She plans to learn how to remove it and replace it herself.  I have asked her to remove it and try to replace it the day before she comes in for her one month follow-up exam and will see how she does with this.  Patient will follow-up with me sooner if she has any issues with this. 3. Osteopenia.  10/2016 T score -1.6 FRAX 3.5% / 0.3%.  Plan repeat DEXA 2-year interval. 4. Mammography 07/2017.  Continue with annual mammography when due.  Breast exam normal today. 5. Colonoscopy 2018.  Repeat at their recommended interval. 6. Pap smear 2015.  Pap smear done today.  No history of significant abnormal Pap smears previously. 7. Health maintenance.  No routine lab work done as patient does this elsewhere.  Follow-up in 1 month for pessary check.  Follow-up in 1 year for annual exam.  Additional time in excess of her routine gynecologic exam was spent in direct face to face counseling and coordination of care in regards to her pessary placement and maintenance discussion.    Anastasio Auerbach MD, 4:08 PM 09/27/2017

## 2017-09-27 NOTE — Patient Instructions (Signed)
Follow up for pessary check in one month

## 2017-09-28 LAB — PAP IG W/ RFLX HPV ASCU

## 2017-10-04 ENCOUNTER — Ambulatory Visit: Payer: Medicare Other | Admitting: Internal Medicine

## 2017-10-20 ENCOUNTER — Ambulatory Visit: Payer: Medicare Other | Admitting: Internal Medicine

## 2017-10-26 ENCOUNTER — Ambulatory Visit (INDEPENDENT_AMBULATORY_CARE_PROVIDER_SITE_OTHER): Payer: Medicare Other | Admitting: Gynecology

## 2017-10-26 ENCOUNTER — Encounter: Payer: Self-pay | Admitting: Gynecology

## 2017-10-26 VITALS — BP 118/76

## 2017-10-26 DIAGNOSIS — N812 Incomplete uterovaginal prolapse: Secondary | ICD-10-CM

## 2017-10-26 DIAGNOSIS — N814 Uterovaginal prolapse, unspecified: Secondary | ICD-10-CM | POA: Diagnosis not present

## 2017-10-26 NOTE — Progress Notes (Signed)
    Regina Roberts [birth year] [id number]        67 y.o.  G2P0011 presents for follow-up exam.  Had ring pessary placed for cervical prolapse last month.  Has done well.  It has spontaneously extruded twice while having bowel movements.  The patient was able to cleanse it and replace it herself without difficulty.  Past medical history,surgical history, problem list, medications, allergies, family history and social history were all reviewed and documented in the EPIC chart.  Directed ROS with pertinent positives and negatives documented in the history of present illness/assessment and plan.  Exam: [name] Vitals:   10/26/17 1111  BP: 118/76   General appearance:  Normal Abdomen soft nontender without masses guarding rebound Pelvic external BUS vagina with atrophic changes.  Ring pessary removed.  Vaginal mucosa without evidence of irritation or erosion.  Bimanual exam without masses or tenderness.  Ring pessary cleansed and replaced without difficulty  Assessment/Plan:  68 y.o. G2P0011 with ring pessary for cervical prolapse.  Doing well with this and is very pleased.  Has spontaneously extruded twice.  Options to try a larger size versus monitoring for now as she is able to replace it easily herself discussed.  Patient comfortable with keeping the same size for now.  Recommended she follow-up in 3 months for her next pessary check.  She will follow-up sooner if any issues.    Anastasio Auerbach MD, 11:27 AM 10/26/2017

## 2017-10-26 NOTE — Patient Instructions (Signed)
Follow up in 3 months

## 2017-11-01 ENCOUNTER — Ambulatory Visit (INDEPENDENT_AMBULATORY_CARE_PROVIDER_SITE_OTHER): Payer: Medicare Other | Admitting: Internal Medicine

## 2017-11-01 ENCOUNTER — Encounter: Payer: Self-pay | Admitting: Internal Medicine

## 2017-11-01 DIAGNOSIS — I10 Essential (primary) hypertension: Secondary | ICD-10-CM | POA: Diagnosis not present

## 2017-11-01 DIAGNOSIS — Z6834 Body mass index (BMI) 34.0-34.9, adult: Secondary | ICD-10-CM | POA: Diagnosis not present

## 2017-11-01 DIAGNOSIS — J309 Allergic rhinitis, unspecified: Secondary | ICD-10-CM | POA: Insufficient documentation

## 2017-11-01 DIAGNOSIS — R059 Cough, unspecified: Secondary | ICD-10-CM | POA: Insufficient documentation

## 2017-11-01 DIAGNOSIS — J301 Allergic rhinitis due to pollen: Secondary | ICD-10-CM | POA: Diagnosis not present

## 2017-11-01 DIAGNOSIS — E6609 Other obesity due to excess calories: Secondary | ICD-10-CM

## 2017-11-01 DIAGNOSIS — R05 Cough: Secondary | ICD-10-CM | POA: Diagnosis not present

## 2017-11-01 MED ORDER — MONTELUKAST SODIUM 10 MG PO TABS: 10.0000 mg | ORAL_TABLET | Freq: Every day | ORAL | 3 refills | Status: DC

## 2017-11-01 MED ORDER — FEXOFENADINE HCL 180 MG PO TABS: 180.0000 mg | ORAL_TABLET | Freq: Every day | ORAL | 3 refills | Status: DC

## 2017-11-01 MED ORDER — PHENTERMINE HCL 37.5 MG PO TABS: ORAL_TABLET | ORAL | 3 refills | Status: DC

## 2017-11-01 NOTE — Assessment & Plan Note (Signed)
BP Readings from Last 3 Encounters:  11/01/17 124/78  10/26/17 118/76  09/27/17 118/78

## 2017-11-01 NOTE — Assessment & Plan Note (Signed)
Allegra Added Singulair

## 2017-11-01 NOTE — Assessment & Plan Note (Signed)
?  etiology ACE cough discussed - pt wants to cont w/BP RX w/o change

## 2017-11-01 NOTE — Assessment & Plan Note (Signed)
Phentermine. BP, HR are ok  Potential benefits of a long term phentermine use as well as potential risks  and complications were explained to the patient and were aknowledged

## 2017-11-01 NOTE — Progress Notes (Signed)
Subjective:  Patient ID: Regina Roberts, female    DOB: 14-Jun-1951  Age: 67 y.o. MRN: [id number]  CC: No chief complaint on file.   HPI Regina Roberts presents for dyslipidemia, obesity C/o sniffles and a hacking cough  Outpatient Medications Prior to Visit  Medication Sig Dispense Refill  . atorvastatin (LIPITOR) 10 MG tablet Take 1 tablet (10 mg total) by mouth daily. 90 tablet 3  . b complex vitamins tablet Take 1 tablet by mouth daily. 100 tablet 3  . CALCIUM PO Take by mouth daily. OTC    . Cholecalciferol (VITAMIN D-3) 1000 UNITS CAPS Take 1 capsule by mouth daily.    Marland Kitchen lisinopril-hydrochlorothiazide (ZESTORETIC) 20-12.5 MG tablet Take 1 tablet by mouth daily. 90 tablet 3  . Multiple Vitamins-Minerals (CENTRUM ADULTS) TABS Take 1 tablet by mouth daily.    . naproxen sodium (ANAPROX) 220 MG tablet Take 220 mg by mouth as needed.    . Omega-3 Fatty Acids (FISH OIL PO) Take by mouth.    . phentermine (ADIPEX-P) 37.5 MG tablet take 1 tablet by mouth once daily BEFORE BREAKFAST 30 tablet 2  . triamcinolone ointment (KENALOG) 0.5 % apply to affected area twice a day 60 g 3   No facility-administered medications prior to visit.     ROS Review of Systems  Constitutional: Negative for activity change, appetite change, chills, fatigue and unexpected weight change.  HENT: Positive for congestion and rhinorrhea. Negative for mouth sores and sinus pressure.   Eyes: Negative for visual disturbance.  Respiratory: Positive for cough. Negative for chest tightness.   Gastrointestinal: Negative for abdominal pain and nausea.  Genitourinary: Negative for difficulty urinating, frequency and vaginal pain.  Musculoskeletal: Negative for back pain and gait problem.  Skin: Negative for pallor and rash.  Neurological: Negative for dizziness, tremors, weakness, numbness and headaches.  Psychiatric/Behavioral: Negative for confusion and sleep disturbance.    Objective:  BP 124/78 (BP  Location: Left Arm, Patient Position: Sitting, Cuff Size: Large)   Pulse 100   Temp 98.3 F (36.8 C) (Oral)   Ht 4' 11.5" (1.511 m)   Wt 177 lb (80.3 kg)   SpO2 98%   BMI 35.15 kg/m   BP Readings from Last 3 Encounters:  11/01/17 124/78  10/26/17 118/76  09/27/17 118/78    Wt Readings from Last 3 Encounters:  11/01/17 177 lb (80.3 kg)  09/27/17 172 lb (78 kg)  08/17/17 167 lb (75.8 kg)    Physical Exam  Constitutional: She appears well-developed. No distress.  HENT:  Head: Normocephalic.  Right Ear: External ear normal.  Left Ear: External ear normal.  Nose: Nose normal.  Mouth/Throat: Oropharynx is clear and moist.  Eyes: Conjunctivae are normal. Pupils are equal, round, and reactive to light. Right eye exhibits no discharge. Left eye exhibits no discharge.  Neck: Normal range of motion. Neck supple. No JVD present. No tracheal deviation present. No thyromegaly present.  Cardiovascular: Normal rate, regular rhythm and normal heart sounds.  Pulmonary/Chest: No stridor. No respiratory distress. She has no wheezes.  Abdominal: Soft. Bowel sounds are normal. She exhibits no distension and no mass. There is no tenderness. There is no rebound and no guarding.  Musculoskeletal: She exhibits no edema or tenderness.  Lymphadenopathy:    She has no cervical adenopathy.  Neurological: She displays normal reflexes. No cranial nerve deficit. She exhibits normal muscle tone. Coordination normal.  Skin: No rash noted. No erythema.  Psychiatric: She has a normal mood and affect.  Her behavior is normal. Judgment and thought content normal.  obese  Lab Results  Component Value Date   WBC 13.0 (H) 09/15/2017   HGB 13.0 09/15/2017   HCT 39.5 09/15/2017   PLT 400.0 09/15/2017   GLUCOSE 109 (H) 09/15/2017   CHOL 165 09/15/2017   TRIG 97.0 09/15/2017   HDL 44.00 09/15/2017   LDLCALC 102 (H) 09/15/2017   ALT 15 09/15/2017   AST 10 09/15/2017   NA 138 09/15/2017   K 3.9 09/15/2017    CL 101 09/15/2017   CREATININE 0.99 09/15/2017   BUN 14 09/15/2017   CO2 31 09/15/2017   TSH 1.07 09/15/2017    Mm Digital Screening Bilateral  Result Date: 08/14/2017 CLINICAL DATA:  Screening. EXAM: DIGITAL SCREENING BILATERAL MAMMOGRAM WITH CAD COMPARISON:  Previous exam(s). ACR Breast Density Category b: There are scattered areas of fibroglandular density. FINDINGS: There are no findings suspicious for malignancy. Images were processed with CAD. IMPRESSION: No mammographic evidence of malignancy. A result letter of this screening mammogram will be mailed directly to the patient. RECOMMENDATION: Screening mammogram in one year. (Code:SM-B-01Y) BI-RADS CATEGORY  2: Benign. Electronically Signed   By: Evangeline Dakin M.D.   On: 08/14/2017 16:23    Assessment & Plan:   There are no diagnoses linked to this encounter. I am having Regina Roberts maintain her CENTRUM ADULTS, Vitamin D-3, CALCIUM PO, naproxen sodium, Omega-3 Fatty Acids (FISH OIL PO), lisinopril-hydrochlorothiazide, atorvastatin, b complex vitamins, triamcinolone ointment, and phentermine.  No orders of the defined types were placed in this encounter.    Follow-up: No Follow-up on file.  Walker Kehr, MD

## 2018-01-10 ENCOUNTER — Other Ambulatory Visit: Payer: Self-pay | Admitting: Internal Medicine

## 2018-01-10 NOTE — Telephone Encounter (Signed)
Routing to dr plotnikov, please advise, thanks 

## 2018-01-18 ENCOUNTER — Encounter: Payer: Self-pay | Admitting: Internal Medicine

## 2018-01-18 ENCOUNTER — Ambulatory Visit (INDEPENDENT_AMBULATORY_CARE_PROVIDER_SITE_OTHER): Payer: Medicare Other | Admitting: Internal Medicine

## 2018-01-18 ENCOUNTER — Ambulatory Visit: Payer: Medicare Other | Admitting: Gynecology

## 2018-01-18 ENCOUNTER — Encounter: Payer: Self-pay | Admitting: Gynecology

## 2018-01-18 VITALS — BP 124/82

## 2018-01-18 DIAGNOSIS — N8185 Cervical stump prolapse: Secondary | ICD-10-CM | POA: Diagnosis not present

## 2018-01-18 DIAGNOSIS — I10 Essential (primary) hypertension: Secondary | ICD-10-CM

## 2018-01-18 DIAGNOSIS — Z4689 Encounter for fitting and adjustment of other specified devices: Secondary | ICD-10-CM | POA: Diagnosis not present

## 2018-01-18 DIAGNOSIS — E6609 Other obesity due to excess calories: Secondary | ICD-10-CM

## 2018-01-18 DIAGNOSIS — Z6834 Body mass index (BMI) 34.0-34.9, adult: Secondary | ICD-10-CM

## 2018-01-18 DIAGNOSIS — J301 Allergic rhinitis due to pollen: Secondary | ICD-10-CM | POA: Diagnosis not present

## 2018-01-18 DIAGNOSIS — R059 Cough, unspecified: Secondary | ICD-10-CM

## 2018-01-18 DIAGNOSIS — E785 Hyperlipidemia, unspecified: Secondary | ICD-10-CM | POA: Diagnosis not present

## 2018-01-18 DIAGNOSIS — R05 Cough: Secondary | ICD-10-CM

## 2018-01-18 NOTE — Assessment & Plan Note (Signed)
pt refusing to switch from Lisinopril HCT

## 2018-01-18 NOTE — Assessment & Plan Note (Signed)
Lisinopril-HCT 

## 2018-01-18 NOTE — Progress Notes (Signed)
    Regina Roberts Mar 16, 1951 [id number]        67 y.o.  G2P0011 history of cervical stump prolapse using ring pessary doing well.  Is able to remove it and replace it herself.  Past medical history,surgical history, problem list, medications, allergies, family history and social history were all reviewed and documented in the EPIC chart.  Directed ROS with pertinent positives and negatives documented in the history of present illness/assessment and plan.  Exam: Regina Roberts assistant Vitals:   01/18/18 0830  BP: 124/82   General appearance:  Normal Abdomen soft nontender without masses guarding rebound Pelvic external BUS vagina with atrophic changes.  No evidence of irritation or erosion.  Cervix grossly normal prolapse to the level of the introitus.  Bimanual without masses or tenderness.  The ring pessary with support was removed cleansed and replaced without difficulty.  Assessment/Plan:  67 y.o. G2P0011 doing well with ring pessary for cervical stump prolapse.  Patient is able to remove it herself and replace it.  She will follow-up in December when due for annual exam, sooner if any issues.    Regina Auerbach MD, 8:39 AM 01/18/2018

## 2018-01-18 NOTE — Assessment & Plan Note (Signed)
Allegra, Singulair

## 2018-01-18 NOTE — Assessment & Plan Note (Signed)
Wt Readings from Last 3 Encounters:  01/18/18 177 lb (80.3 kg)  11/01/17 177 lb (80.3 kg)  09/27/17 172 lb (78 kg)

## 2018-01-18 NOTE — Patient Instructions (Signed)
Follow-up in December when due for annual exam.  Sooner if any issues.

## 2018-01-18 NOTE — Assessment & Plan Note (Signed)
Lipitor 

## 2018-01-18 NOTE — Progress Notes (Signed)
Subjective:  Patient ID: Regina Roberts, female    DOB: [birth year]  Age: 67 y.o. MRN: [id number]  CC: No chief complaint on file.   HPI Regina Roberts presents for wt gain C/o allergies - worse; cough - pt refusing to switch from Lisinopril HCT F/u dyslipidemia   Outpatient Medications Prior to Visit  Medication Sig Dispense Refill  . atorvastatin (LIPITOR) 10 MG tablet Take 1 tablet (10 mg total) by mouth daily. 90 tablet 3  . b complex vitamins tablet Take 1 tablet by mouth daily. 100 tablet 3  . CALCIUM PO Take by mouth daily. OTC    . Cholecalciferol (VITAMIN D-3) 1000 UNITS CAPS Take 1 capsule by mouth daily.    . fexofenadine (ALLEGRA ALLERGY) 180 MG tablet Take 1 tablet (180 mg total) by mouth daily. 100 tablet 3  . lisinopril-hydrochlorothiazide (ZESTORETIC) 20-12.5 MG tablet Take 1 tablet by mouth daily. 90 tablet 3  . montelukast (SINGULAIR) 10 MG tablet Take 1 tablet (10 mg total) by mouth daily. 90 tablet 3  . Multiple Vitamins-Minerals (CENTRUM ADULTS) TABS Take 1 tablet by mouth daily.    . naproxen sodium (ANAPROX) 220 MG tablet Take 220 mg by mouth as needed.    . Omega-3 Fatty Acids (FISH OIL PO) Take by mouth.    . phentermine (ADIPEX-P) 37.5 MG tablet TAKE 1 TABLET BY MOUTH ONCE DAILY BEFORE BREAKFAST 30 tablet 2  . triamcinolone ointment (KENALOG) 0.5 % apply to affected area twice a day 60 g 3   No facility-administered medications prior to visit.     ROS Review of Systems  Constitutional: Positive for unexpected weight change. Negative for activity change, appetite change, chills and fatigue.  HENT: Negative for congestion, mouth sores and sinus pressure.   Eyes: Negative for visual disturbance.  Respiratory: Negative for cough and chest tightness.   Gastrointestinal: Negative for abdominal pain and nausea.  Genitourinary: Negative for difficulty urinating, frequency and vaginal pain.  Musculoskeletal: Negative for back pain and gait problem.    Skin: Negative for pallor and rash.  Neurological: Negative for dizziness, tremors, weakness, numbness and headaches.  Psychiatric/Behavioral: Negative for confusion and sleep disturbance.    Objective:  BP 122/74 (BP Location: Left Arm, Patient Position: Sitting, Cuff Size: Large)   Pulse 99   Temp 98.8 F (37.1 C) (Oral)   Ht 4' 11.5" (1.511 m)   Wt 177 lb (80.3 kg)   SpO2 98%   BMI 35.15 kg/m   BP Readings from Last 3 Encounters:  01/18/18 122/74  01/18/18 124/82  11/01/17 124/78    Wt Readings from Last 3 Encounters:  01/18/18 177 lb (80.3 kg)  11/01/17 177 lb (80.3 kg)  09/27/17 172 lb (78 kg)    Physical Exam  Constitutional: She appears well-developed. No distress.  HENT:  Head: Normocephalic.  Right Ear: External ear normal.  Left Ear: External ear normal.  Nose: Nose normal.  Mouth/Throat: Oropharynx is clear and moist.  Eyes: Pupils are equal, round, and reactive to light. Conjunctivae are normal. Right eye exhibits no discharge. Left eye exhibits no discharge.  Neck: Normal range of motion. Neck supple. No JVD present. No tracheal deviation present. No thyromegaly present.  Cardiovascular: Normal rate, regular rhythm and normal heart sounds.  Pulmonary/Chest: No stridor. No respiratory distress. She has no wheezes.  Abdominal: Soft. Bowel sounds are normal. She exhibits no distension and no mass. There is no tenderness. There is no rebound and no guarding.  Musculoskeletal: She exhibits  no edema or tenderness.  Lymphadenopathy:    She has no cervical adenopathy.  Neurological: She displays normal reflexes. No cranial nerve deficit. She exhibits normal muscle tone. Coordination normal.  Skin: No rash noted. No erythema.  Psychiatric: She has a normal mood and affect. Her behavior is normal. Judgment and thought content normal.  obese  Lab Results  Component Value Date   WBC 13.0 (H) 09/15/2017   HGB 13.0 09/15/2017   HCT 39.5 09/15/2017   PLT 400.0  09/15/2017   GLUCOSE 109 (H) 09/15/2017   CHOL 165 09/15/2017   TRIG 97.0 09/15/2017   HDL 44.00 09/15/2017   LDLCALC 102 (H) 09/15/2017   ALT 15 09/15/2017   AST 10 09/15/2017   NA 138 09/15/2017   K 3.9 09/15/2017   CL 101 09/15/2017   CREATININE 0.99 09/15/2017   BUN 14 09/15/2017   CO2 31 09/15/2017   TSH 1.07 09/15/2017    Mm Digital Screening Bilateral  Result Date: 08/14/2017 CLINICAL DATA:  Screening. EXAM: DIGITAL SCREENING BILATERAL MAMMOGRAM WITH CAD COMPARISON:  Previous exam(s). ACR Breast Density Category b: There are scattered areas of fibroglandular density. FINDINGS: There are no findings suspicious for malignancy. Images were processed with CAD. IMPRESSION: No mammographic evidence of malignancy. A result letter of this screening mammogram will be mailed directly to the patient. RECOMMENDATION: Screening mammogram in one year. (Code:SM-B-01Y) BI-RADS CATEGORY  2: Benign. Electronically Signed   By: Evangeline Dakin M.D.   On: 08/14/2017 16:23    Assessment & Plan:   There are no diagnoses linked to this encounter. I am having [name] maintain her CENTRUM ADULTS, Vitamin D-3, CALCIUM PO, naproxen sodium, Omega-3 Fatty Acids (FISH OIL PO), lisinopril-hydrochlorothiazide, atorvastatin, b complex vitamins, triamcinolone ointment, montelukast, fexofenadine, and phentermine.  No orders of the defined types were placed in this encounter.    Follow-up: No follow-ups on file.  Walker Kehr, MD

## 2018-02-21 ENCOUNTER — Other Ambulatory Visit: Payer: Self-pay | Admitting: Internal Medicine

## 2018-05-16 ENCOUNTER — Other Ambulatory Visit: Payer: Self-pay | Admitting: Internal Medicine

## 2018-05-21 ENCOUNTER — Encounter: Payer: Self-pay | Admitting: Internal Medicine

## 2018-05-21 ENCOUNTER — Ambulatory Visit (INDEPENDENT_AMBULATORY_CARE_PROVIDER_SITE_OTHER): Payer: Medicare Other | Admitting: Internal Medicine

## 2018-05-21 ENCOUNTER — Other Ambulatory Visit (INDEPENDENT_AMBULATORY_CARE_PROVIDER_SITE_OTHER): Payer: Medicare Other

## 2018-05-21 VITALS — BP 120/76 | HR 101 | Temp 98.7°F | Ht 59.5 in | Wt 178.0 lb

## 2018-05-21 DIAGNOSIS — E785 Hyperlipidemia, unspecified: Secondary | ICD-10-CM | POA: Diagnosis not present

## 2018-05-21 DIAGNOSIS — R000 Tachycardia, unspecified: Secondary | ICD-10-CM | POA: Diagnosis not present

## 2018-05-21 DIAGNOSIS — E669 Obesity, unspecified: Secondary | ICD-10-CM | POA: Diagnosis not present

## 2018-05-21 LAB — HEPATIC FUNCTION PANEL
ALT: 13 U/L (ref 0–35)
AST: 9 U/L (ref 0–37)
Albumin: 4 g/dL (ref 3.5–5.2)
Alkaline Phosphatase: 60 U/L (ref 39–117)
Bilirubin, Direct: 0.1 mg/dL (ref 0.0–0.3)
Total Bilirubin: 0.3 mg/dL (ref 0.2–1.2)
Total Protein: 7.2 g/dL (ref 6.0–8.3)

## 2018-05-21 LAB — CBC WITH DIFFERENTIAL/PLATELET
Basophils Absolute: 0.1 10*3/uL (ref 0.0–0.1)
Basophils Relative: 0.4 % (ref 0.0–3.0)
Eosinophils Absolute: 0.2 10*3/uL (ref 0.0–0.7)
Eosinophils Relative: 1.4 % (ref 0.0–5.0)
HCT: 36.8 % (ref 36.0–46.0)
Hemoglobin: 12.5 g/dL (ref 12.0–15.0)
Lymphocytes Relative: 27.6 % (ref 12.0–46.0)
Lymphs Abs: 4.3 10*3/uL — ABNORMAL HIGH (ref 0.7–4.0)
MCHC: 34.1 g/dL (ref 30.0–36.0)
MCV: 86.3 fl (ref 78.0–100.0)
Monocytes Absolute: 1.6 10*3/uL — ABNORMAL HIGH (ref 0.1–1.0)
Monocytes Relative: 10 % (ref 3.0–12.0)
Neutro Abs: 9.5 10*3/uL — ABNORMAL HIGH (ref 1.4–7.7)
Neutrophils Relative %: 60.6 % (ref 43.0–77.0)
Platelets: 378 10*3/uL (ref 150.0–400.0)
RBC: 4.26 Mil/uL (ref 3.87–5.11)
RDW: 14.7 % (ref 11.5–15.5)
WBC: 15.6 10*3/uL — ABNORMAL HIGH (ref 4.0–10.5)

## 2018-05-21 LAB — BASIC METABOLIC PANEL
BUN: 23 mg/dL (ref 6–23)
CO2: 25 mEq/L (ref 19–32)
Calcium: 10.2 mg/dL (ref 8.4–10.5)
Chloride: 102 mEq/L (ref 96–112)
Creatinine, Ser: 1.18 mg/dL (ref 0.40–1.20)
GFR: 58.9 mL/min — ABNORMAL LOW (ref >60.00)
Glucose, Bld: 105 mg/dL — ABNORMAL HIGH (ref 70–99)
Potassium: 3.8 mEq/L (ref 3.5–5.1)
Sodium: 139 mEq/L (ref 135–145)

## 2018-05-21 LAB — LIPID PANEL
Cholesterol: 155 mg/dL (ref 0–200)
HDL: 38.5 mg/dL — ABNORMAL LOW (ref >39.00)
LDL Cholesterol: 79 mg/dL (ref 0–99)
NonHDL: 116.19
Total CHOL/HDL Ratio: 4
Triglycerides: 186 mg/dL — ABNORMAL HIGH (ref 0.0–149.0)
VLDL: 37.2 mg/dL (ref 0.0–40.0)

## 2018-05-21 LAB — TSH: TSH: 1.62 u[IU]/mL (ref 0.35–4.50)

## 2018-05-21 NOTE — Assessment & Plan Note (Signed)
S tachy - could be related to phentermine. Pt states HR is up when not taking phentermine Labs

## 2018-05-21 NOTE — Progress Notes (Signed)
Subjective:  Patient ID: Regina Roberts, female    DOB: 07/23/1951  Age: 67 y.o. MRN: [id number]  CC: No chief complaint on file.   HPI CARETHA RUMBAUGH presents for wt gain, obesity, HTN, dyslipidemia f/u  Outpatient Medications Prior to Visit  Medication Sig Dispense Refill  . atorvastatin (LIPITOR) 10 MG tablet TAKE 1 TABLET BY MOUTH ONCE DAILY 90 tablet 1  . b complex vitamins tablet Take 1 tablet by mouth daily. 100 tablet 3  . CALCIUM PO Take by mouth daily. OTC    . Cholecalciferol (VITAMIN D-3) 1000 UNITS CAPS Take 1 capsule by mouth daily.    . fexofenadine (ALLEGRA ALLERGY) 180 MG tablet Take 1 tablet (180 mg total) by mouth daily. 100 tablet 3  . lisinopril-hydrochlorothiazide (PRINZIDE,ZESTORETIC) 20-12.5 MG tablet TAKE 1 TABLET BY MOUTH ONCE DAILY 90 tablet 1  . montelukast (SINGULAIR) 10 MG tablet Take 1 tablet (10 mg total) by mouth daily. 90 tablet 3  . Multiple Vitamins-Minerals (CENTRUM ADULTS) TABS Take 1 tablet by mouth daily.    . naproxen sodium (ANAPROX) 220 MG tablet Take 220 mg by mouth as needed.    . Omega-3 Fatty Acids (FISH OIL PO) Take by mouth.    . phentermine (ADIPEX-P) 37.5 MG tablet TAKE 1 TABLET BY MOUTH ONCE DAILY BEFORE BREAKFAST 30 tablet 2  . triamcinolone ointment (KENALOG) 0.5 % apply to affected area twice a day 60 g 3   No facility-administered medications prior to visit.     ROS: Review of Systems  Constitutional: Negative for activity change, appetite change, chills, fatigue and unexpected weight change.  HENT: Negative for congestion, mouth sores and sinus pressure.   Eyes: Negative for visual disturbance.  Respiratory: Negative for cough and chest tightness.   Gastrointestinal: Negative for abdominal pain and nausea.  Genitourinary: Negative for difficulty urinating, frequency and vaginal pain.  Musculoskeletal: Negative for back pain and gait problem.  Skin: Negative for pallor and rash.  Neurological: Negative for  dizziness, tremors, weakness, numbness and headaches.  Psychiatric/Behavioral: Negative for confusion and sleep disturbance.    Objective:  BP 120/76 (BP Location: Right Arm, Patient Position: Sitting, Cuff Size: Normal)   Pulse (!) 101   Temp 98.7 F (37.1 C) (Oral)   Ht 4' 11.5" (1.511 m)   Wt 178 lb (80.7 kg)   SpO2 96%   BMI 35.35 kg/m   BP Readings from Last 3 Encounters:  05/21/18 120/76  01/18/18 122/74  01/18/18 124/82    Wt Readings from Last 3 Encounters:  05/21/18 178 lb (80.7 kg)  01/18/18 177 lb (80.3 kg)  11/01/17 177 lb (80.3 kg)    Physical Exam  Constitutional: She appears well-developed. No distress.  HENT:  Head: Normocephalic.  Right Ear: External ear normal.  Left Ear: External ear normal.  Nose: Nose normal.  Mouth/Throat: Oropharynx is clear and moist.  Eyes: Pupils are equal, round, and reactive to light. Conjunctivae are normal. Right eye exhibits no discharge. Left eye exhibits no discharge.  Neck: Normal range of motion. Neck supple. No JVD present. No tracheal deviation present. No thyromegaly present.  Cardiovascular: Normal rate, regular rhythm and normal heart sounds.  Pulmonary/Chest: No stridor. No respiratory distress. She has no wheezes.  Abdominal: Soft. Bowel sounds are normal. She exhibits no distension and no mass. There is no tenderness. There is no rebound and no guarding.  Musculoskeletal: She exhibits no edema or tenderness.  Lymphadenopathy:    She has no cervical adenopathy.  Neurological: She displays normal reflexes. No cranial nerve deficit. She exhibits normal muscle tone. Coordination normal.  Skin: No rash noted. No erythema.  Psychiatric: She has a normal mood and affect. Her behavior is normal. Judgment and thought content normal.  tachycardic- HR 90s regular Obese  Lab Results  Component Value Date   WBC 13.0 (H) 09/15/2017   HGB 13.0 09/15/2017   HCT 39.5 09/15/2017   PLT 400.0 09/15/2017   GLUCOSE 109 (H)  09/15/2017   CHOL 165 09/15/2017   TRIG 97.0 09/15/2017   HDL 44.00 09/15/2017   LDLCALC 102 (H) 09/15/2017   ALT 15 09/15/2017   AST 10 09/15/2017   NA 138 09/15/2017   K 3.9 09/15/2017   CL 101 09/15/2017   CREATININE 0.99 09/15/2017   BUN 14 09/15/2017   CO2 31 09/15/2017   TSH 1.07 09/15/2017    Mm Digital Screening Bilateral  Result Date: 08/14/2017 CLINICAL DATA:  Screening. EXAM: DIGITAL SCREENING BILATERAL MAMMOGRAM WITH CAD COMPARISON:  Previous exam(s). ACR Breast Density Category b: There are scattered areas of fibroglandular density. FINDINGS: There are no findings suspicious for malignancy. Images were processed with CAD. IMPRESSION: No mammographic evidence of malignancy. A result letter of this screening mammogram will be mailed directly to the patient. RECOMMENDATION: Screening mammogram in one year. (Code:SM-B-01Y) BI-RADS CATEGORY  2: Benign. Electronically Signed   By: Evangeline Dakin M.D.   On: 08/14/2017 16:23    Assessment & Plan:   There are no diagnoses linked to this encounter.   No orders of the defined types were placed in this encounter.    Follow-up: No follow-ups on file.  Walker Kehr, MD

## 2018-05-21 NOTE — Assessment & Plan Note (Addendum)
I suggested we stop phentermine: S tachy - could be related to phentermine. Pt states HR is up when not taking phentermine Ref to Dr Leafy Ro offered - will ref D/c rx

## 2018-07-27 ENCOUNTER — Other Ambulatory Visit: Payer: Self-pay | Admitting: Gynecology

## 2018-07-27 DIAGNOSIS — Z1231 Encounter for screening mammogram for malignant neoplasm of breast: Secondary | ICD-10-CM

## 2018-08-21 ENCOUNTER — Ambulatory Visit (INDEPENDENT_AMBULATORY_CARE_PROVIDER_SITE_OTHER): Payer: Medicare Other | Admitting: Internal Medicine

## 2018-08-21 ENCOUNTER — Encounter: Payer: Self-pay | Admitting: Internal Medicine

## 2018-08-21 DIAGNOSIS — E669 Obesity, unspecified: Secondary | ICD-10-CM | POA: Diagnosis not present

## 2018-08-21 DIAGNOSIS — E785 Hyperlipidemia, unspecified: Secondary | ICD-10-CM | POA: Diagnosis not present

## 2018-08-21 DIAGNOSIS — R000 Tachycardia, unspecified: Secondary | ICD-10-CM

## 2018-08-21 DIAGNOSIS — I10 Essential (primary) hypertension: Secondary | ICD-10-CM

## 2018-08-21 NOTE — Assessment & Plan Note (Signed)
Pt stopped phentermine 3 mo ago Re-ref to Dr Leafy Ro Low carb diet

## 2018-08-21 NOTE — Assessment & Plan Note (Signed)
Lisinopril HCT BP nl

## 2018-08-21 NOTE — Assessment & Plan Note (Signed)
Resolved off phentermine

## 2018-08-21 NOTE — Progress Notes (Signed)
Subjective:  Patient ID: Regina Roberts, female    DOB: [birth year]  Age: 67 y.o. MRN: [id number]  CC: No chief complaint on file.   HPI Regina Roberts presents for obesity, wt gain, HTN  Outpatient Medications Prior to Visit  Medication Sig Dispense Refill  . atorvastatin (LIPITOR) 10 MG tablet TAKE 1 TABLET BY MOUTH ONCE DAILY 90 tablet 1  . b complex vitamins tablet Take 1 tablet by mouth daily. 100 tablet 3  . CALCIUM PO Take by mouth daily. OTC    . Cholecalciferol (VITAMIN D-3) 1000 UNITS CAPS Take 1 capsule by mouth daily.    . fexofenadine (ALLEGRA ALLERGY) 180 MG tablet Take 1 tablet (180 mg total) by mouth daily. 100 tablet 3  . lisinopril-hydrochlorothiazide (PRINZIDE,ZESTORETIC) 20-12.5 MG tablet TAKE 1 TABLET BY MOUTH ONCE DAILY 90 tablet 1  . montelukast (SINGULAIR) 10 MG tablet Take 1 tablet (10 mg total) by mouth daily. 90 tablet 3  . Multiple Vitamins-Minerals (CENTRUM ADULTS) TABS Take 1 tablet by mouth daily.    . naproxen sodium (ANAPROX) 220 MG tablet Take 220 mg by mouth as needed.    . Omega-3 Fatty Acids (FISH OIL PO) Take by mouth.    . triamcinolone ointment (KENALOG) 0.5 % apply to affected area twice a day 60 g 3   No facility-administered medications prior to visit.     ROS: Review of Systems  Constitutional: Positive for unexpected weight change. Negative for activity change, appetite change, chills and fatigue.  HENT: Negative for congestion, mouth sores and sinus pressure.   Eyes: Negative for visual disturbance.  Respiratory: Negative for cough and chest tightness.   Gastrointestinal: Negative for abdominal pain and nausea.  Genitourinary: Negative for difficulty urinating, frequency and vaginal pain.  Musculoskeletal: Negative for back pain and gait problem.  Skin: Negative for pallor and rash.  Neurological: Negative for dizziness, tremors, weakness, numbness and headaches.  Psychiatric/Behavioral: Negative for confusion, sleep  disturbance and suicidal ideas. The patient is not nervous/anxious.     Objective:  BP 120/76 (BP Location: Left Arm, Patient Position: Sitting, Cuff Size: Normal)   Pulse 84   Temp 98.5 F (36.9 C) (Oral)   Ht 4' 11.5" (1.511 m)   Wt 185 lb (83.9 kg)   SpO2 97%   BMI 36.74 kg/m   BP Readings from Last 3 Encounters:  08/21/18 120/76  05/21/18 120/76  01/18/18 122/74    Wt Readings from Last 3 Encounters:  08/21/18 185 lb (83.9 kg)  05/21/18 178 lb (80.7 kg)  01/18/18 177 lb (80.3 kg)    Physical Exam  Constitutional: She appears well-developed. No distress.  HENT:  Head: Normocephalic.  Right Ear: External ear normal.  Left Ear: External ear normal.  Nose: Nose normal.  Mouth/Throat: Oropharynx is clear and moist.  Eyes: Pupils are equal, round, and reactive to light. Conjunctivae are normal. Right eye exhibits no discharge. Left eye exhibits no discharge.  Neck: Normal range of motion. Neck supple. No JVD present. No tracheal deviation present. No thyromegaly present.  Cardiovascular: Normal rate, regular rhythm and normal heart sounds.  Pulmonary/Chest: No stridor. No respiratory distress. She has no wheezes.  Abdominal: Soft. Bowel sounds are normal. She exhibits no distension and no mass. There is no tenderness. There is no rebound and no guarding.  Musculoskeletal: She exhibits no edema or tenderness.  Lymphadenopathy:    She has no cervical adenopathy.  Neurological: She displays normal reflexes. No cranial nerve deficit. She exhibits normal  muscle tone. Coordination normal.  Skin: No rash noted. No erythema.  Psychiatric: She has a normal mood and affect. Her behavior is normal. Judgment and thought content normal.  obese  Lab Results  Component Value Date   WBC 15.6 (H) 05/21/2018   HGB 12.5 05/21/2018   HCT 36.8 05/21/2018   PLT 378.0 05/21/2018   GLUCOSE 105 (H) 05/21/2018   CHOL 155 05/21/2018   TRIG 186.0 (H) 05/21/2018   HDL 38.50 (L) 05/21/2018    LDLCALC 79 05/21/2018   ALT 13 05/21/2018   AST 9 05/21/2018   NA 139 05/21/2018   K 3.8 05/21/2018   CL 102 05/21/2018   CREATININE 1.18 05/21/2018   BUN 23 05/21/2018   CO2 25 05/21/2018   TSH 1.62 05/21/2018    Mm Digital Screening Bilateral  Result Date: 08/14/2017 CLINICAL DATA:  Screening. EXAM: DIGITAL SCREENING BILATERAL MAMMOGRAM WITH CAD COMPARISON:  Previous exam(s). ACR Breast Density Category b: There are scattered areas of fibroglandular density. FINDINGS: There are no findings suspicious for malignancy. Images were processed with CAD. IMPRESSION: No mammographic evidence of malignancy. A result letter of this screening mammogram will be mailed directly to the patient. RECOMMENDATION: Screening mammogram in one year. (Code:SM-B-01Y) BI-RADS CATEGORY  2: Benign. Electronically Signed   By: Evangeline Dakin M.D.   On: 08/14/2017 16:23    Assessment & Plan:   There are no diagnoses linked to this encounter.   No orders of the defined types were placed in this encounter.    Follow-up: No follow-ups on file.  Walker Kehr, MD

## 2018-08-21 NOTE — Assessment & Plan Note (Signed)
On Lipitor 

## 2018-08-21 NOTE — Addendum Note (Signed)
Addended by: Karren Cobble on: 08/21/2018 09:18 AM   Modules accepted: Orders

## 2018-08-27 ENCOUNTER — Ambulatory Visit (INDEPENDENT_AMBULATORY_CARE_PROVIDER_SITE_OTHER): Payer: Medicare Other | Admitting: *Deleted

## 2018-08-27 VITALS — BP 134/78 | HR 92 | Resp 18 | Ht 60.0 in | Wt 183.0 lb

## 2018-08-27 DIAGNOSIS — Z0000 Encounter for general adult medical examination without abnormal findings: Secondary | ICD-10-CM | POA: Diagnosis not present

## 2018-08-27 DIAGNOSIS — Z23 Encounter for immunization: Secondary | ICD-10-CM

## 2018-08-27 NOTE — Patient Instructions (Addendum)
[facility] in [place], [place] in: [place] Address: [address] [place], [place] [address] Phone: [phone]  Continue doing brain stimulating activities (puzzles, reading, adult coloring books, staying active) to keep memory sharp.   Continue to eat heart healthy diet (full of fruits, vegetables, whole grains, lean protein, water--limit salt, fat, and sugar intake) and increase physical activity as tolerated.  Regina Roberts , Thank you for taking time to come for your Medicare Wellness Visit. I appreciate your ongoing commitment to your health goals. Please review the following plan we discussed and let me know if I can assist you in the future.   These are the goals we discussed: Goals    . Patient Stated     I want to start to go to the park more often and enjoy the tranquility of nature and peace. Continue to collect and enjoy angels.       This is a list of the screening recommended for you and due dates:  Health Maintenance  Topic Date Due  .  Hepatitis C: One time screening is recommended by Center for Disease Control  (CDC) for  adults born from [birth year] through 1964.   29-Jan-1964  . Pneumonia vaccines (1 of 2 - PCV13) 03/02/2017  . Mammogram  08/15/2019  . Tetanus Vaccine  08/01/2026  . Colon Cancer Screening  08/04/2027  . Flu Shot  Completed  . DEXA scan (bone density measurement)  Completed   Health Maintenance, Female Adopting a healthy lifestyle and getting preventive care can go a long way to promote health and wellness. Talk with your health care provider about what schedule of regular examinations is right for you. This is a good chance for you to check in with your provider about disease prevention and staying healthy. In between checkups, there are plenty of things you can do on your own. Experts have done a lot of research about which lifestyle changes and preventive measures are  most likely to keep you healthy. Ask your health care provider for more information. Weight and diet Eat a healthy diet  Be sure to include plenty of vegetables, fruits, low-fat dairy products, and lean protein.  Do not eat a lot of foods high in solid fats, added sugars, or salt.  Get regular exercise. This is one of the most important things you can do for your health. ? Most adults should exercise for at least 150 minutes each week. The exercise should increase your heart rate and make you sweat (moderate-intensity exercise). ? Most adults should also do strengthening exercises at least twice a week. This is in addition to the moderate-intensity exercise.  Maintain a healthy weight  Body mass index (BMI) is a measurement that can be used to identify possible weight problems. It estimates body fat based on height and weight. Your health care provider can help determine your BMI and help you achieve or maintain a healthy weight.  For females 1 years of age and older: ? A BMI below 18.5 is considered underweight. ? A BMI of 18.5 to 24.9 is normal. ? A BMI of 25 to 29.9 is considered overweight. ? A BMI of 30 and above is considered obese.  Watch levels of cholesterol and blood lipids  You should start having your blood tested for lipids and cholesterol at 66 years of age, then have this test every 5 years.  You may need to have your cholesterol levels  checked more often if: ? Your lipid or cholesterol levels are high. ? You are older than 66 years of age. ? You are at high risk for heart disease.  Cancer screening Lung Cancer  Lung cancer screening is recommended for adults 66-66 years old who are at high risk for lung cancer because of a history of smoking.  A yearly low-dose CT scan of the lungs is recommended for people who: ? Currently smoke. ? Have quit within the past 15 years. ? Have at least a 30-pack-year history of smoking. A pack year is smoking an average of one  pack of cigarettes a day for 1 year.  Yearly screening should continue until it has been 15 years since you quit.  Yearly screening should stop if you develop a health problem that would prevent you from having lung cancer treatment.  Breast Cancer  Practice breast self-awareness. This means understanding how your breasts normally appear and feel.  It also means doing regular breast self-exams. Let your health care provider know about any changes, no matter how small.  If you are in your 66s or 66s, you should have a clinical breast exam (CBE) by a health care provider every 1-3 years as part of a regular health exam.  If you are 66 or older, have a CBE every year. Also consider having a breast X-ray (mammogram) every year.  If you have a family history of breast cancer, talk to your health care provider about genetic screening.  If you are at high risk for breast cancer, talk to your health care provider about having an MRI and a mammogram every year.  Breast cancer gene (BRCA) assessment is recommended for women who have family members with BRCA-related cancers. BRCA-related cancers include: ? Breast. ? Ovarian. ? Tubal. ? Peritoneal cancers.  Results of the assessment will determine the need for genetic counseling and BRCA1 and BRCA2 testing.  Cervical Cancer Your health care provider may recommend that you be screened regularly for cancer of the pelvic organs (ovaries, uterus, and vagina). This screening involves a pelvic examination, including checking for microscopic changes to the surface of your cervix (Pap test). You may be encouraged to have this screening done every 3 years, beginning at age 66.  For women ages 66-66, health care providers may recommend pelvic exams and Pap testing every 3 years, or they may recommend the Pap and pelvic exam, combined with testing for human papilloma virus (HPV), every 5 years. Some types of HPV increase your risk of cervical cancer. Testing  for HPV may also be done on women of any age with unclear Pap test results.  Other health care providers may not recommend any screening for nonpregnant women who are considered low risk for pelvic cancer and who do not have symptoms. Ask your health care provider if a screening pelvic exam is right for you.  If you have had past treatment for cervical cancer or a condition that could lead to cancer, you need Pap tests and screening for cancer for at least 20 years after your treatment. If Pap tests have been discontinued, your risk factors (such as having a new sexual partner) need to be reassessed to determine if screening should resume. Some women have medical problems that increase the chance of getting cervical cancer. In these cases, your health care provider may recommend more frequent screening and Pap tests.  Colorectal Cancer  This type of cancer can be detected and often prevented.  Routine colorectal cancer screening  usually begins at 66 years of age and continues through 66 years of age.  Your health care provider may recommend screening at an earlier age if you have risk factors for colon cancer.  Your health care provider may also recommend using home test kits to check for hidden blood in the stool.  A small camera at the end of a tube can be used to examine your colon directly (sigmoidoscopy or colonoscopy). This is done to check for the earliest forms of colorectal cancer.  Routine screening usually begins at age 31.  Direct examination of the colon should be repeated every 5-10 years through 66 years of age. However, you may need to be screened more often if early forms of precancerous polyps or small growths are found.  Skin Cancer  Check your skin from head to toe regularly.  Tell your health care provider about any new moles or changes in moles, especially if there is a change in a mole's shape or color.  Also tell your health care provider if you have a mole that is  larger than the size of a pencil eraser.  Always use sunscreen. Apply sunscreen liberally and repeatedly throughout the day.  Protect yourself by wearing long sleeves, pants, a wide-brimmed hat, and sunglasses whenever you are outside.  Heart disease, diabetes, and high blood pressure  High blood pressure causes heart disease and increases the risk of stroke. High blood pressure is more likely to develop in: ? People who have blood pressure in the high end of the normal range (130-139/85-89 mm Hg). ? People who are overweight or obese. ? People who are African American.  If you are 27-52 years of age, have your blood pressure checked every 3-5 years. If you are 10 years of age or older, have your blood pressure checked every year. You should have your blood pressure measured twice-once when you are at a hospital or clinic, and once when you are not at a hospital or clinic. Record the average of the two measurements. To check your blood pressure when you are not at a hospital or clinic, you can use: ? An automated blood pressure machine at a pharmacy. ? A home blood pressure monitor.  If you are between 48 years and 89 years old, ask your health care provider if you should take aspirin to prevent strokes.  Have regular diabetes screenings. This involves taking a blood sample to check your fasting blood sugar level. ? If you are at a normal weight and have a low risk for diabetes, have this test once every three years after 66 years of age. ? If you are overweight and have a high risk for diabetes, consider being tested at a younger age or more often. Preventing infection Hepatitis B  If you have a higher risk for hepatitis B, you should be screened for this virus. You are considered at high risk for hepatitis B if: ? You were born in a country where hepatitis B is common. Ask your health care provider which countries are considered high risk. ? Your parents were born in a high-risk country,  and you have not been immunized against hepatitis B (hepatitis B vaccine). ? You have HIV or AIDS. ? You use needles to inject street drugs. ? You live with someone who has hepatitis B. ? You have had sex with someone who has hepatitis B. ? You get hemodialysis treatment. ? You take certain medicines for conditions, including cancer, organ transplantation, and autoimmune conditions.  Hepatitis C  Blood testing is recommended for: ? Everyone born from [birth year] through 1965. ? Anyone with known risk factors for hepatitis C.  Sexually transmitted infections (STIs)  You should be screened for sexually transmitted infections (STIs) including gonorrhea and chlamydia if: ? You are sexually active and are younger than 66 years of age. ? You are older than 66 years of age and your health care provider tells you that you are at risk for this type of infection. ? Your sexual activity has changed since you were last screened and you are at an increased risk for chlamydia or gonorrhea. Ask your health care provider if you are at risk.  If you do not have HIV, but are at risk, it may be recommended that you take a prescription medicine daily to prevent HIV infection. This is called pre-exposure prophylaxis (PrEP). You are considered at risk if: ? You are sexually active and do not regularly use condoms or know the HIV status of your partner(s). ? You take drugs by injection. ? You are sexually active with a partner who has HIV.  Talk with your health care provider about whether you are at high risk of being infected with HIV. If you choose to begin PrEP, you should first be tested for HIV. You should then be tested every 3 months for as long as you are taking PrEP. Pregnancy  If you are premenopausal and you may become pregnant, ask your health care provider about preconception counseling.  If you may become pregnant, take 400 to 800 micrograms (mcg) of folic acid every day.  If you want to prevent  pregnancy, talk to your health care provider about birth control (contraception). Osteoporosis and menopause  Osteoporosis is a disease in which the bones lose minerals and strength with aging. This can result in serious bone fractures. Your risk for osteoporosis can be identified using a bone density scan.  If you are 21 years of age or older, or if you are at risk for osteoporosis and fractures, ask your health care provider if you should be screened.  Ask your health care provider whether you should take a calcium or vitamin D supplement to lower your risk for osteoporosis.  Menopause may have certain physical symptoms and risks.  Hormone replacement therapy may reduce some of these symptoms and risks. Talk to your health care provider about whether hormone replacement therapy is right for you. Follow these instructions at home:  Schedule regular health, dental, and eye exams.  Stay current with your immunizations.  Do not use any tobacco products including cigarettes, chewing tobacco, or electronic cigarettes.  If you are pregnant, do not drink alcohol.  If you are breastfeeding, limit how much and how often you drink alcohol.  Limit alcohol intake to no more than 1 drink per day for nonpregnant women. One drink equals 12 ounces of beer, 5 ounces of Alfa Leibensperger, or 1 ounces of hard liquor.  Do not use street drugs.  Do not share needles.  Ask your health care provider for help if you need support or information about quitting drugs.  Tell your health care provider if you often feel depressed.  Tell your health care provider if you have ever been abused or do not feel safe at home. This information is not intended to replace advice given to you by your health care provider. Make sure you discuss any questions you have with your health care provider. Document Released: 04/25/2011 Document Revised: 03/17/2016 Document Reviewed: 07/14/2015  Chartered certified accountant Patient Education  [employer].

## 2018-08-27 NOTE — Progress Notes (Addendum)
Subjective:   Regina Roberts is a 67 y.o. female who presents for an Initial Medicare Annual Wellness Visit.  Review of Systems    No ROS.  Medicare Wellness Visit. Additional risk factors are reflected in the social history.  Cardiac Risk Factors include: advanced age (>25men, >63 women);hypertension Sleep patterns: feels rested on waking, does not get up to void, gets up 1-2 times nightly to void and sleeps 4-5 hours nightly. Reports this is her baseline and she feels rested.    Home Safety/Smoke Alarms: Feels safe in home. Smoke alarms in place.  Living environment; residence and Firearm Safety: 1-story house/ trailer, no firearms.  Lives with husband, no needs for DME, good support system Seat Belt Safety/Bike Helmet: Wears seat belt.    Objective:    Today's Vitals   08/27/18 0920  BP: 134/78  Pulse: 92  Resp: 18  SpO2: 98%  Weight: 183 lb (83 kg)  Height: 5' (1.524 m)   Body mass index is 35.74 kg/m.  Advanced Directives 08/27/2018 07/20/2017  Does Patient Have a Medical Advance Directive? No No  Does patient want to make changes to medical advance directive? Yes (ED - Information included in AVS) -    Current Medications (verified) Outpatient Encounter Medications as of 08/27/2018  Medication Sig  . atorvastatin (LIPITOR) 10 MG tablet TAKE 1 TABLET BY MOUTH ONCE DAILY  . b complex vitamins tablet Take 1 tablet by mouth daily.  Marland Kitchen CALCIUM PO Take by mouth daily. OTC  . Cholecalciferol (VITAMIN D-3) 1000 UNITS CAPS Take 1 capsule by mouth daily.  . fexofenadine (ALLEGRA ALLERGY) 180 MG tablet Take 1 tablet (180 mg total) by mouth daily.  Marland Kitchen lisinopril-hydrochlorothiazide (PRINZIDE,ZESTORETIC) 20-12.5 MG tablet TAKE 1 TABLET BY MOUTH ONCE DAILY  . montelukast (SINGULAIR) 10 MG tablet Take 1 tablet (10 mg total) by mouth daily.  . Multiple Vitamins-Minerals (CENTRUM ADULTS) TABS Take 1 tablet by mouth daily.  . naproxen sodium (ANAPROX) 220 MG tablet Take 220 mg by  mouth as needed.  . Omega-3 Fatty Acids (FISH OIL PO) Take by mouth.  . triamcinolone ointment (KENALOG) 0.5 % apply to affected area twice a day   No facility-administered encounter medications on file as of 08/27/2018.     Allergies (verified) Phentermine   History: Past Medical History:  Diagnosis Date  . Allergy   . Anemia   . Blood transfusion without reported diagnosis   . GERD (gastroesophageal reflux disease)   . Hyperlipidemia   . Hypertension   . Osteopenia 10/2016   T score -1.6 FRAX 3.5%/0.3%   Past Surgical History:  Procedure Laterality Date  . HERNIA REPAIR    . PELVIC LAPAROSCOPY  1996  . SUPRACERVICAL ABDOMINAL HYSTERECTOMY  2004   with RSO. Leiomyoma menorrhagia with 16-18 week size uterus   Family History  Problem Relation Age of Onset  . Heart disease Mother 42  . Cancer Mother 55       neck ca   . Stroke Father 50  . Other Brother        train accident  . Other Brother        respiratory  . Other Brother        liver failure ?  Marland Kitchen Other Daughter        suicide  . Breast cancer Maternal Grandmother        untreated  . Colon cancer Neg Hx   . Esophageal cancer Neg Hx   . Pancreatic cancer Neg  Hx   . Rectal cancer Neg Hx   . Stomach cancer Neg Hx    Social History   Socioeconomic History  . Marital status: Married    Spouse name: Not on file  . Number of children: Not on file  . Years of education: Not on file  . Highest education level: Not on file  Occupational History  . Not on file  Social Needs  . Financial resource strain: Not hard at all  . Food insecurity:    Worry: Never true    Inability: Never true  . Transportation needs:    Medical: No    Non-medical: No  Tobacco Use  . Smoking status: Never Smoker  . Smokeless tobacco: Never Used  Substance and Sexual Activity  . Alcohol use: Yes    Comment: rarely   . Drug use: No  . Sexual activity: Yes    Birth control/protection: Surgical    Comment: 1st intercourse 55  yo-5 partners  Lifestyle  . Physical activity:    Days per week: 2 days    Minutes per session: 40 min  . Stress: Not at all  Relationships  . Social connections:    Talks on phone: More than three times a week    Gets together: More than three times a week    Attends religious service: 1 to 4 times per year    Active member of club or organization: Yes    Attends meetings of clubs or organizations: More than 4 times per year    Relationship status: Married  Other Topics Concern  . Not on file  Social History Narrative  . Not on file    Tobacco Counseling Counseling given: Not Answered  Activities of Daily Living In your present state of health, do you have any difficulty performing the following activities: 08/27/2018  Hearing? N  Vision? N  Difficulty concentrating or making decisions? N  Walking or climbing stairs? N  Dressing or bathing? N  Doing errands, shopping? N  Preparing Food and eating ? N  Using the Toilet? N  In the past six months, have you accidently leaked urine? N  Do you have problems with loss of bowel control? N  Managing your Medications? N  Managing your Finances? N  Housekeeping or managing your Housekeeping? N  Some recent data might be hidden     Immunizations and Health Maintenance Immunization History  Administered Date(s) Administered  . Influenza Whole 07/16/2008  . Influenza, High Dose Seasonal PF 07/16/2015, 08/03/2018  . Influenza,inj,Quad PF,6+ Mos 07/22/2016  . Influenza-Unspecified 08/13/2014, 09/07/2017  . Pneumococcal Conjugate-13 08/27/2018  . Tdap 08/01/2016   Health Maintenance Due  Topic Date Due  . Hepatitis C Screening  [birth year]    Patient Care Team: Plotnikov, Evie Lacks, MD as PCP - General  Indicate any recent Medical Services you may have received from other than [facility] providers in the past year (date may be approximate).     Assessment:   This is a routine wellness examination for [facility]. Physical  assessment deferred to PCP.  Hearing/Vision screen  Visual Acuity Screening   Right eye Left eye Both eyes  Without correction:   20/32  With correction:     Comments: Education provided regarding the importance of annual eye exams, resource provided  Hearing Screening Comments: Able to hear conversational tones w/o difficulty. No issues reported. Passed whisper test   Dietary issues and exercise activities discussed: Current Exercise Habits: Home exercise routine, Type of exercise:  walking(exercise tape), Time (Minutes): 40, Frequency (Times/Week): 3, Weekly Exercise (Minutes/Week): 120, Intensity: Mild, Exercise limited by: None identified  Diet (meal preparation, eat out, water intake, caffeinated beverages, dairy products, fruits and vegetables): in general, a "healthy" diet  , well balanced   Reviewed heart healthy diet. Encouraged patient to increase daily water and healthy fluid intake.  Goals    . Patient Stated     I want to start to go to the park more often and enjoy the tranquility of nature and peace. Continue to collect and enjoy angels.      Depression Screen PHQ 2/9 Scores 08/27/2018 08/21/2018 08/17/2017  PHQ - 2 Score 0 0 0    Fall Risk Fall Risk  08/27/2018 08/21/2018 08/17/2017  Falls in the past year? 0 No No   Cognitive Function:       Ad8 score reviewed for issues:  Issues making decisions: no  Less interest in hobbies / activities: no  Repeats questions, stories (family complaining): no  Trouble using ordinary gadgets (microwave, computer, phone):no  Forgets the month or year: no  Mismanaging finances: no  Remembering appts: no  Daily problems with thinking and/or memory: no Ad8 score is= 0  Screening Tests Health Maintenance  Topic Date Due  . Hepatitis C Screening  1950-12-23  . MAMMOGRAM  08/15/2019  . PNA vac Low Risk Adult (2 of 2 - PPSV23) 08/28/2019  . TETANUS/TDAP  08/01/2026  . COLONOSCOPY  08/04/2027  . INFLUENZA VACCINE   Completed  . DEXA SCAN  Completed     Plan:     Continue doing brain stimulating activities (puzzles, reading, adult coloring books, staying active) to keep memory sharp.   Continue to eat heart healthy diet (full of fruits, vegetables, whole grains, lean protein, water--limit salt, fat, and sugar intake) and increase physical activity as tolerated.  I have personally reviewed and noted the following in the patient's chart:   . Medical and social history . Use of alcohol, tobacco or illicit drugs  . Current medications and supplements . Functional ability and status . Nutritional status . Physical activity . Advanced directives . List of other physicians . Vitals . Screenings to include cognitive, depression, and falls . Referrals and appointments  In addition, I have reviewed and discussed with patient certain preventive protocols, quality metrics, and best practice recommendations. A written personalized care plan for preventive services as well as general preventive health recommendations were provided to patient.     Michiel Cowboy, RN   08/27/2018   Medical screening examination/treatment/procedure(s) were performed by non-physician practitioner and as supervising physician I was immediately available for consultation/collaboration. I agree with above. Lew Dawes, MD

## 2018-08-31 ENCOUNTER — Ambulatory Visit
Admission: RE | Admit: 2018-08-31 | Discharge: 2018-08-31 | Disposition: A | Discharge status: Home / Self Care | Payer: Medicare Other | Source: Ambulatory Visit | Attending: Gynecology | Admitting: Gynecology

## 2018-08-31 DIAGNOSIS — Z1231 Encounter for screening mammogram for malignant neoplasm of breast: Secondary | ICD-10-CM

## 2018-09-12 ENCOUNTER — Encounter: Payer: Self-pay | Admitting: Gynecology

## 2018-09-12 ENCOUNTER — Ambulatory Visit (INDEPENDENT_AMBULATORY_CARE_PROVIDER_SITE_OTHER): Payer: Medicare Other | Admitting: Gynecology

## 2018-09-12 VITALS — BP 124/78 | Ht 61.0 in | Wt 185.0 lb

## 2018-09-12 DIAGNOSIS — M8580 Other specified disorders of bone density and structure, unspecified site: Secondary | ICD-10-CM | POA: Diagnosis not present

## 2018-09-12 DIAGNOSIS — N814 Uterovaginal prolapse, unspecified: Secondary | ICD-10-CM

## 2018-09-12 DIAGNOSIS — Z01419 Encounter for gynecological examination (general) (routine) without abnormal findings: Secondary | ICD-10-CM | POA: Diagnosis not present

## 2018-09-12 DIAGNOSIS — N952 Postmenopausal atrophic vaginitis: Secondary | ICD-10-CM

## 2018-09-12 DIAGNOSIS — N812 Incomplete uterovaginal prolapse: Secondary | ICD-10-CM

## 2018-09-12 NOTE — Progress Notes (Signed)
    Regina Roberts 06/12/1951 [id number]        67 y.o.  G2P0011 for annual gynecologic exam.  Doing well without gynecologic complaints.  Past medical history,surgical history, problem list, medications, allergies, family history and social history were all reviewed and documented as reviewed in the EPIC chart.  ROS:  Performed with pertinent positives and negatives included in the history, assessment and plan.   Additional significant findings : None   Exam: [name] Vitals:   09/12/18 1129  BP: 124/78  Weight: 185 lb (83.9 kg)  Height: 5\' 1"  (1.549 m)   Body mass index is 34.96 kg/m.  General appearance:  Normal affect, orientation and appearance. Skin: Grossly normal HEENT: Without gross lesions.  No cervical or supraclavicular adenopathy. Thyroid normal.  Lungs:  Clear without wheezing, rales or rhonchi Cardiac: RR, without RMG Abdominal:  Soft, nontender, without masses, guarding, rebound, organomegaly or hernia Breasts:  Examined lying and sitting without masses, retractions, discharge or axillary adenopathy. Pelvic:  Ext, BUS, Vagina: Normal with atrophic changes  Cervix: Normal with atrophic changes.  Supported well with ring pessary  Adnexa: Without masses or tenderness    Anus and perineum: Normal   Rectovaginal: Normal sphincter tone without palpated masses or tenderness.    Assessment/Plan:  67 y.o. G33P0011 female for annual gynecologic exam.   1. Cervical stump prolapse.  History of supracervical hysterectomy.  Had cervical prolapse which she controls with a ring pessary with support.  She does well with this as she is able to remove it clean and replace it herself.  Exam shows good support today.  She will follow-up if there is any issues with this. 2. Postmenopausal.  No significant menopausal symptoms or any vaginal bleeding. 3. Pap smear 2018.  No Pap smear done today.  No history of significant abnormal Pap smears.  Options to repeat Pap  smear at 3-year interval versus stop screening per current screening guidelines based on age discussed.  Will readdress on an annual basis. 4. Mammography 08/2018.  Continue with annual mammography next year.  Breast exam normal today. 5. Colonoscopy 2018.  Repeat at their recommended interval. 6. Osteopenia.  DEXA 10/2016 T score -1.6.  FRAX 3.5% / 0.3%.  Plan repeat DEXA beginning of next year at 2-year interval and patient will schedule. 7. Health maintenance.  No routine lab work done as patient does this elsewhere.  Aloe up for bone density otherwise follow-up in 1 year for annual exam.   Anastasio Auerbach MD, 1:11 PM 09/12/2018

## 2018-09-12 NOTE — Patient Instructions (Signed)
Followup for bone density as scheduled. 

## 2018-10-02 ENCOUNTER — Encounter: Payer: Medicare Other | Admitting: Gynecology

## 2018-10-03 ENCOUNTER — Encounter: Payer: Medicare Other | Admitting: Gynecology

## 2018-11-20 ENCOUNTER — Other Ambulatory Visit: Payer: Self-pay | Admitting: Gynecology

## 2018-11-20 ENCOUNTER — Ambulatory Visit (INDEPENDENT_AMBULATORY_CARE_PROVIDER_SITE_OTHER): Payer: Medicare Other

## 2018-11-20 ENCOUNTER — Telehealth: Payer: Self-pay | Admitting: Internal Medicine

## 2018-11-20 DIAGNOSIS — Z780 Asymptomatic menopausal state: Secondary | ICD-10-CM

## 2018-11-20 DIAGNOSIS — M8589 Other specified disorders of bone density and structure, multiple sites: Secondary | ICD-10-CM

## 2018-11-20 DIAGNOSIS — M8580 Other specified disorders of bone density and structure, unspecified site: Secondary | ICD-10-CM

## 2018-11-20 MED ORDER — ATORVASTATIN CALCIUM 10 MG PO TABS: 10.0000 mg | ORAL_TABLET | Freq: Every day | ORAL | 0 refills | Status: DC

## 2018-11-20 MED ORDER — LISINOPRIL-HYDROCHLOROTHIAZIDE 20-12.5 MG PO TABS: 1.0000 | ORAL_TABLET | Freq: Every day | ORAL | 0 refills | Status: DC

## 2018-11-20 MED ORDER — MONTELUKAST SODIUM 10 MG PO TABS: 10.0000 mg | ORAL_TABLET | Freq: Every day | ORAL | 0 refills | Status: DC

## 2018-11-20 NOTE — Telephone Encounter (Signed)
Reviewed chart pt is due for annual appt in April. Sent enough refill until annual appt.Regina KitchenJohny Roberts

## 2018-11-20 NOTE — Telephone Encounter (Signed)
Patient came by the office requesting refills on the following medications: lisinopril-hydrochlorothiazide (PRINZIDE,ZESTORETIC) 20-12.5 MG tablet atorvastatin (LIPITOR) 10 MG tablet montelukast (SINGULAIR) 10 MG tablet  Pharmacy: [place]  Last OV: 08/21/2018 Next OV Scheduled: 02/20/2019

## 2018-11-21 ENCOUNTER — Encounter: Payer: Self-pay | Admitting: Gynecology

## 2018-11-30 ENCOUNTER — Other Ambulatory Visit: Payer: Self-pay | Admitting: Internal Medicine

## 2018-12-18 ENCOUNTER — Other Ambulatory Visit: Payer: Self-pay | Admitting: Internal Medicine

## 2019-02-20 ENCOUNTER — Other Ambulatory Visit: Payer: Self-pay

## 2019-02-20 ENCOUNTER — Telehealth: Payer: Self-pay | Admitting: Internal Medicine

## 2019-02-20 ENCOUNTER — Other Ambulatory Visit (INDEPENDENT_AMBULATORY_CARE_PROVIDER_SITE_OTHER): Payer: Medicare Other

## 2019-02-20 ENCOUNTER — Ambulatory Visit (INDEPENDENT_AMBULATORY_CARE_PROVIDER_SITE_OTHER): Payer: Medicare Other | Admitting: Internal Medicine

## 2019-02-20 ENCOUNTER — Encounter: Payer: Self-pay | Admitting: Internal Medicine

## 2019-02-20 DIAGNOSIS — I10 Essential (primary) hypertension: Secondary | ICD-10-CM

## 2019-02-20 DIAGNOSIS — R05 Cough: Secondary | ICD-10-CM

## 2019-02-20 DIAGNOSIS — E669 Obesity, unspecified: Secondary | ICD-10-CM

## 2019-02-20 DIAGNOSIS — E785 Hyperlipidemia, unspecified: Secondary | ICD-10-CM

## 2019-02-20 DIAGNOSIS — R059 Cough, unspecified: Secondary | ICD-10-CM

## 2019-02-20 DIAGNOSIS — K219 Gastro-esophageal reflux disease without esophagitis: Secondary | ICD-10-CM | POA: Insufficient documentation

## 2019-02-20 LAB — BASIC METABOLIC PANEL
BUN: 13 mg/dL (ref 6–23)
CO2: 29 mEq/L (ref 19–32)
Calcium: 9.8 mg/dL (ref 8.4–10.5)
Chloride: 102 mEq/L (ref 96–112)
Creatinine, Ser: 0.99 mg/dL (ref 0.40–1.20)
GFR: 67.7 mL/min (ref >60.00)
Glucose, Bld: 113 mg/dL — ABNORMAL HIGH (ref 70–99)
Potassium: 3.9 mEq/L (ref 3.5–5.1)
Sodium: 139 mEq/L (ref 135–145)

## 2019-02-20 LAB — HEPATIC FUNCTION PANEL
ALT: 14 U/L (ref 0–35)
AST: 11 U/L (ref 0–37)
Albumin: 4.1 g/dL (ref 3.5–5.2)
Alkaline Phosphatase: 60 U/L (ref 39–117)
Bilirubin, Direct: 0.1 mg/dL (ref 0.0–0.3)
Total Bilirubin: 0.5 mg/dL (ref 0.2–1.2)
Total Protein: 7.3 g/dL (ref 6.0–8.3)

## 2019-02-20 LAB — LIPID PANEL
Cholesterol: 170 mg/dL (ref 0–200)
HDL: 39.2 mg/dL (ref >39.00)
LDL Cholesterol: 107 mg/dL — ABNORMAL HIGH (ref 0–99)
NonHDL: 130.71
Total CHOL/HDL Ratio: 4
Triglycerides: 121 mg/dL (ref 0.0–149.0)
VLDL: 24.2 mg/dL (ref 0.0–40.0)

## 2019-02-20 MED ORDER — MONTELUKAST SODIUM 10 MG PO TABS: 10.0000 mg | ORAL_TABLET | Freq: Every day | ORAL | 3 refills | Status: DC

## 2019-02-20 MED ORDER — LOSARTAN POTASSIUM-HCTZ 100-12.5 MG PO TABS: 1.0000 | ORAL_TABLET | Freq: Every day | ORAL | 3 refills | Status: DC

## 2019-02-20 MED ORDER — PANTOPRAZOLE SODIUM 40 MG PO TBEC: 40.0000 mg | DELAYED_RELEASE_TABLET | Freq: Every day | ORAL | 3 refills | Status: DC

## 2019-02-20 MED ORDER — LOSARTAN POTASSIUM 100 MG PO TABS: 100.0000 mg | ORAL_TABLET | Freq: Every day | ORAL | 3 refills | Status: DC

## 2019-02-20 MED ORDER — HYDROCHLOROTHIAZIDE 12.5 MG PO TABS: 12.5000 mg | ORAL_TABLET | Freq: Every day | ORAL | 3 refills | Status: DC

## 2019-02-20 MED ORDER — ATORVASTATIN CALCIUM 10 MG PO TABS: 10.0000 mg | ORAL_TABLET | Freq: Every day | ORAL | 3 refills | Status: DC

## 2019-02-20 NOTE — Assessment & Plan Note (Signed)
ACE cough: Lisinopril HCT - d/c'd. Start Protonix

## 2019-02-20 NOTE — Assessment & Plan Note (Signed)
Ref to Dr Leafy Ro 7/19 - pt states it is too expensive for her Diet

## 2019-02-20 NOTE — Telephone Encounter (Signed)
Separate RX's sent

## 2019-02-20 NOTE — Telephone Encounter (Signed)
Copied from [place] [id number]. Topic: General - Inquiry >> Feb 20, 2019 11:11 AM Richardo Priest, NT wrote: Reason for CRM: Pharmacist from [facility] called in in regards to losartan-hydrochlorothiazide (HYZAAR) 100-12.5 MG tablet medication. Pharmacist is requesting if these medications can be split due to the medication being on a nationwide back order. Please advice. Call back is [phone].

## 2019-02-20 NOTE — Assessment & Plan Note (Signed)
Start Protonoix

## 2019-02-20 NOTE — Assessment & Plan Note (Signed)
Lisinopr-HCT - d/c Start Losartan HCT

## 2019-02-20 NOTE — Assessment & Plan Note (Signed)
labs

## 2019-02-20 NOTE — Progress Notes (Signed)
Subjective:  Patient ID: Regina Roberts, female    DOB: 1951/08/20  Age: 68 y.o. MRN: [id number]  CC: No chief complaint on file.   HPI Regina Roberts presents for dyslipidemia, obesity, HTN C/o GERD - worse; cough, throat would close up at night x 3 times  Outpatient Medications Prior to Visit  Medication Sig Dispense Refill  . atorvastatin (LIPITOR) 10 MG tablet Take 1 tablet (10 mg total) by mouth daily. Annual appt due in April must see provider for future refills 90 tablet 0  . b complex vitamins tablet Take 1 tablet by mouth daily. 100 tablet 3  . CALCIUM PO Take by mouth daily. OTC    . Cholecalciferol (VITAMIN D-3) 1000 UNITS CAPS Take 1 capsule by mouth daily.    . fexofenadine (ALLEGRA ALLERGY) 180 MG tablet Take 1 tablet (180 mg total) by mouth daily. 100 tablet 3  . lisinopril-hydrochlorothiazide (PRINZIDE,ZESTORETIC) 20-12.5 MG tablet TAKE 1 TABLET BY MOUTH ONCE DAILY 90 tablet 0  . montelukast (SINGULAIR) 10 MG tablet Take 1 tablet (10 mg total) by mouth daily. Annual appt due in April must see provider for future refills 90 tablet 0  . Multiple Vitamins-Minerals (CENTRUM ADULTS) TABS Take 1 tablet by mouth daily.    . naproxen sodium (ANAPROX) 220 MG tablet Take 220 mg by mouth as needed.    . Omega-3 Fatty Acids (FISH OIL PO) Take by mouth.    . triamcinolone ointment (KENALOG) 0.5 % apply to affected area twice a day 60 g 3   No facility-administered medications prior to visit.     ROS: Review of Systems  Constitutional: Negative for activity change, appetite change, chills, fatigue and unexpected weight change.  HENT: Negative for congestion, mouth sores and sinus pressure.   Eyes: Negative for visual disturbance.  Respiratory: Positive for cough. Negative for chest tightness.   Gastrointestinal: Negative for abdominal pain and nausea.  Genitourinary: Negative for difficulty urinating, frequency and vaginal pain.  Musculoskeletal: Negative for back pain  and gait problem.  Skin: Negative for pallor and rash.  Neurological: Negative for dizziness, tremors, weakness, numbness and headaches.  Psychiatric/Behavioral: Negative for confusion, sleep disturbance and suicidal ideas.    Objective:  BP 124/80 (BP Location: Left Arm, Patient Position: Sitting, Cuff Size: Normal)   Pulse 80   Temp 98.2 F (36.8 C) (Oral)   Ht 5' (1.524 m)   Wt 186 lb (84.4 kg)   SpO2 97%   BMI 36.33 kg/m   BP Readings from Last 3 Encounters:  02/20/19 124/80  09/12/18 124/78  08/27/18 134/78    Wt Readings from Last 3 Encounters:  02/20/19 186 lb (84.4 kg)  09/12/18 185 lb (83.9 kg)  08/27/18 183 lb (83 kg)    Physical Exam Constitutional:      General: She is not in acute distress.    Appearance: She is well-developed.  HENT:     Head: Normocephalic.     Right Ear: External ear normal.     Left Ear: External ear normal.     Nose: Nose normal.  Eyes:     General:        Right eye: No discharge.        Left eye: No discharge.     Conjunctiva/sclera: Conjunctivae normal.     Pupils: Pupils are equal, round, and reactive to light.  Neck:     Musculoskeletal: Normal range of motion and neck supple.     Thyroid: No thyromegaly.  Vascular: No JVD.     Trachea: No tracheal deviation.  Cardiovascular:     Rate and Rhythm: Normal rate and regular rhythm.     Heart sounds: Normal heart sounds.  Pulmonary:     Effort: No respiratory distress.     Breath sounds: No stridor. No wheezing.  Abdominal:     General: Bowel sounds are normal. There is no distension.     Palpations: Abdomen is soft. There is no mass.     Tenderness: There is no abdominal tenderness. There is no guarding or rebound.  Musculoskeletal:        General: No tenderness.  Lymphadenopathy:     Cervical: No cervical adenopathy.  Skin:    Findings: No erythema or rash.  Neurological:     Mental Status: She is oriented to person, place, and time.     Cranial Nerves: No  cranial nerve deficit.     Motor: No abnormal muscle tone.     Coordination: Coordination normal.     Deep Tendon Reflexes: Reflexes normal.  Psychiatric:        Behavior: Behavior normal.        Thought Content: Thought content normal.        Judgment: Judgment normal.   obese   Lab Results  Component Value Date   WBC 15.6 (H) 05/21/2018   HGB 12.5 05/21/2018   HCT 36.8 05/21/2018   PLT 378.0 05/21/2018   GLUCOSE 105 (H) 05/21/2018   CHOL 155 05/21/2018   TRIG 186.0 (H) 05/21/2018   HDL 38.50 (L) 05/21/2018   LDLCALC 79 05/21/2018   ALT 13 05/21/2018   AST 9 05/21/2018   NA 139 05/21/2018   K 3.8 05/21/2018   CL 102 05/21/2018   CREATININE 1.18 05/21/2018   BUN 23 05/21/2018   CO2 25 05/21/2018   TSH 1.62 05/21/2018    Mm 3d Screen Breast Bilateral  Result Date: 08/31/2018 CLINICAL DATA:  Screening. EXAM: DIGITAL SCREENING BILATERAL MAMMOGRAM WITH TOMO AND CAD COMPARISON:  Previous exam(s). ACR Breast Density Category b: There are scattered areas of fibroglandular density. FINDINGS: There are no findings suspicious for malignancy. Images were processed with CAD. IMPRESSION: No mammographic evidence of malignancy. A result letter of this screening mammogram will be mailed directly to the patient. RECOMMENDATION: Screening mammogram in one year. (Code:SM-B-01Y) BI-RADS CATEGORY  1: Negative. Electronically Signed   By: Lillia Mountain M.D.   On: 08/31/2018 15:15    Assessment & Plan:   There are no diagnoses linked to this encounter.  Follow-up: No follow-ups on file.  Walker Kehr, MD

## 2019-02-26 ENCOUNTER — Other Ambulatory Visit: Payer: Self-pay | Admitting: Internal Medicine

## 2019-03-15 ENCOUNTER — Other Ambulatory Visit: Payer: Self-pay | Admitting: Internal Medicine

## 2019-05-29 ENCOUNTER — Other Ambulatory Visit: Payer: Self-pay | Admitting: Internal Medicine

## 2019-07-23 ENCOUNTER — Other Ambulatory Visit: Payer: Self-pay | Admitting: Gynecology

## 2019-07-23 DIAGNOSIS — Z1231 Encounter for screening mammogram for malignant neoplasm of breast: Secondary | ICD-10-CM

## 2019-07-31 ENCOUNTER — Encounter: Payer: Self-pay | Admitting: Gynecology

## 2019-08-22 ENCOUNTER — Other Ambulatory Visit: Payer: Self-pay

## 2019-08-22 ENCOUNTER — Ambulatory Visit (INDEPENDENT_AMBULATORY_CARE_PROVIDER_SITE_OTHER): Payer: Medicare Other | Admitting: Internal Medicine

## 2019-08-22 ENCOUNTER — Encounter: Payer: Self-pay | Admitting: Internal Medicine

## 2019-08-22 DIAGNOSIS — E669 Obesity, unspecified: Secondary | ICD-10-CM

## 2019-08-22 DIAGNOSIS — I10 Essential (primary) hypertension: Secondary | ICD-10-CM | POA: Diagnosis not present

## 2019-08-22 DIAGNOSIS — E785 Hyperlipidemia, unspecified: Secondary | ICD-10-CM

## 2019-08-22 MED ORDER — TRIAMCINOLONE ACETONIDE 0.5 % EX OINT: TOPICAL_OINTMENT | CUTANEOUS | 3 refills | Status: DC

## 2019-08-22 MED ORDER — PANTOPRAZOLE SODIUM 40 MG PO TBEC: 40.0000 mg | DELAYED_RELEASE_TABLET | Freq: Every day | ORAL | 2 refills | Status: DC

## 2019-08-22 NOTE — Progress Notes (Signed)
Subjective:  Patient ID: Regina Roberts, female    DOB: [birth year]  Age: 68 y.o. MRN: [id number]  CC: No chief complaint on file.   HPI TIMAKA BUCKLEY presents for dyslipidemia, GERD, HTN C/o wt gain  Outpatient Medications Prior to Visit  Medication Sig Dispense Refill  . atorvastatin (LIPITOR) 10 MG tablet Take 1 tablet (10 mg total) by mouth daily. 90 tablet 3  . b complex vitamins tablet Take 1 tablet by mouth daily. 100 tablet 3  . CALCIUM PO Take by mouth daily. OTC    . Cholecalciferol (VITAMIN D-3) 1000 UNITS CAPS Take 1 capsule by mouth daily.    . fexofenadine (ALLEGRA ALLERGY) 180 MG tablet Take 1 tablet (180 mg total) by mouth daily. 100 tablet 3  . hydrochlorothiazide (HYDRODIURIL) 12.5 MG tablet Take 1 tablet (12.5 mg total) by mouth daily. 90 tablet 3  . losartan (COZAAR) 100 MG tablet Take 1 tablet (100 mg total) by mouth daily. 90 tablet 3  . losartan-hydrochlorothiazide (HYZAAR) 100-12.5 MG tablet Take 1 tablet by mouth daily. 90 tablet 3  . montelukast (SINGULAIR) 10 MG tablet Take 1 tablet (10 mg total) by mouth daily. Annual appt due in April must see provider for future refills 90 tablet 3  . Multiple Vitamins-Minerals (CENTRUM ADULTS) TABS Take 1 tablet by mouth daily.    . naproxen sodium (ANAPROX) 220 MG tablet Take 220 mg by mouth as needed.    . Omega-3 Fatty Acids (FISH OIL PO) Take by mouth.    . pantoprazole (PROTONIX) 40 MG tablet Take 1 tablet (40 mg total) by mouth daily. Follow-up appt due in Aug must see provider for future refills 30 tablet 2  . triamcinolone ointment (KENALOG) 0.5 % apply to affected area twice a day 60 g 3   No facility-administered medications prior to visit.     ROS: Review of Systems  Constitutional: Positive for fatigue and unexpected weight change. Negative for activity change, appetite change and chills.  HENT: Negative for congestion, mouth sores and sinus pressure.   Eyes: Negative for visual disturbance.   Respiratory: Negative for cough and chest tightness.   Gastrointestinal: Negative for abdominal pain and nausea.  Genitourinary: Negative for difficulty urinating, frequency and vaginal pain.  Musculoskeletal: Negative for back pain and gait problem.  Skin: Negative for pallor and rash.  Neurological: Negative for dizziness, tremors, weakness, numbness and headaches.  Psychiatric/Behavioral: Negative for confusion, sleep disturbance and suicidal ideas.    Objective:  BP 122/74 (BP Location: Left Arm, Patient Position: Sitting, Cuff Size: Large)   Pulse 98   Temp 98.6 F (37 C) (Oral)   Ht 5' (1.524 m)   Wt 190 lb (86.2 kg)   SpO2 97%   BMI 37.11 kg/m   BP Readings from Last 3 Encounters:  08/22/19 122/74  02/20/19 124/80  09/12/18 124/78    Wt Readings from Last 3 Encounters:  08/22/19 190 lb (86.2 kg)  02/20/19 186 lb (84.4 kg)  09/12/18 185 lb (83.9 kg)    Physical Exam Constitutional:      General: She is not in acute distress.    Appearance: She is well-developed. She is obese.  HENT:     Head: Normocephalic.     Right Ear: External ear normal.     Left Ear: External ear normal.     Nose: Nose normal.  Eyes:     General:        Right eye: No discharge.  Left eye: No discharge.     Conjunctiva/sclera: Conjunctivae normal.     Pupils: Pupils are equal, round, and reactive to light.  Neck:     Musculoskeletal: Normal range of motion and neck supple.     Thyroid: No thyromegaly.     Vascular: No JVD.     Trachea: No tracheal deviation.  Cardiovascular:     Rate and Rhythm: Normal rate and regular rhythm.     Heart sounds: Normal heart sounds.  Pulmonary:     Effort: No respiratory distress.     Breath sounds: No stridor. No wheezing.  Abdominal:     General: Bowel sounds are normal. There is no distension.     Palpations: Abdomen is soft. There is no mass.     Tenderness: There is no abdominal tenderness. There is no guarding or rebound.   Musculoskeletal:        General: No tenderness.  Lymphadenopathy:     Cervical: No cervical adenopathy.  Skin:    Findings: No erythema or rash.  Neurological:     Mental Status: She is oriented to person, place, and time.     Cranial Nerves: No cranial nerve deficit.     Motor: No abnormal muscle tone.     Coordination: Coordination normal.     Deep Tendon Reflexes: Reflexes normal.  Psychiatric:        Behavior: Behavior normal.        Thought Content: Thought content normal.        Judgment: Judgment normal.     Lab Results  Component Value Date   WBC 15.6 (H) 05/21/2018   HGB 12.5 05/21/2018   HCT 36.8 05/21/2018   PLT 378.0 05/21/2018   GLUCOSE 113 (H) 02/20/2019   CHOL 170 02/20/2019   TRIG 121.0 02/20/2019   HDL 39.20 02/20/2019   LDLCALC 107 (H) 02/20/2019   ALT 14 02/20/2019   AST 11 02/20/2019   NA 139 02/20/2019   K 3.9 02/20/2019   CL 102 02/20/2019   CREATININE 0.99 02/20/2019   BUN 13 02/20/2019   CO2 29 02/20/2019   TSH 1.62 05/21/2018    Mm 3d Screen Breast Bilateral  Result Date: 08/31/2018 CLINICAL DATA:  Screening. EXAM: DIGITAL SCREENING BILATERAL MAMMOGRAM WITH TOMO AND CAD COMPARISON:  Previous exam(s). ACR Breast Density Category b: There are scattered areas of fibroglandular density. FINDINGS: There are no findings suspicious for malignancy. Images were processed with CAD. IMPRESSION: No mammographic evidence of malignancy. A result letter of this screening mammogram will be mailed directly to the patient. RECOMMENDATION: Screening mammogram in one year. (Code:SM-B-01Y) BI-RADS CATEGORY  1: Negative. Electronically Signed   By: Lillia Mountain M.D.   On: 08/31/2018 15:15    Assessment & Plan:   There are no diagnoses linked to this encounter.   Meds ordered this encounter  Medications  . pantoprazole (PROTONIX) 40 MG tablet    Sig: Take 1 tablet (40 mg total) by mouth daily.    Dispense:  30 tablet    Refill:  2  . triamcinolone ointment  (KENALOG) 0.5 %    Sig: apply to affected area twice a day    Dispense:  60 g    Refill:  3     Follow-up: No follow-ups on file.  Walker Kehr, MD

## 2019-08-22 NOTE — Assessment & Plan Note (Signed)
  A cardiac CT scan for calcium scoring offered  Lipitor

## 2019-08-22 NOTE — Patient Instructions (Addendum)
Cardiac CT calcium scoring test $150 Tel # is [phone]   Computed tomography, more commonly known as a CT or CAT scan, is a diagnostic medical imaging test. Like traditional x-rays, it produces multiple images or pictures of the inside of the body. The cross-sectional images generated during a CT scan can be reformatted in multiple planes. They can even generate three-dimensional images. These images can be viewed on a computer monitor, printed on film or by a 3D printer, or transferred to a CD or DVD. CT images of internal organs, bones, soft tissue and blood vessels provide greater detail than traditional x-rays, particularly of soft tissues and blood vessels. A cardiac CT scan for coronary calcium is a non-invasive way of obtaining information about the presence, location and extent of calcified plaque in the coronary arteries-the vessels that supply oxygen-containing blood to the heart muscle. Calcified plaque results when there is a build-up of fat and other substances under the inner layer of the artery. This material can calcify which signals the presence of atherosclerosis, a disease of the vessel wall, also called coronary artery disease (CAD). People with this disease have an increased risk for heart attacks. In addition, over time, progression of plaque build up (CAD) can narrow the arteries or even close off blood flow to the heart. The result may be chest pain, sometimes called "angina," or a heart attack. Because calcium is a marker of CAD, the amount of calcium detected on a cardiac CT scan is a helpful prognostic tool. The findings on cardiac CT are expressed as a calcium score. Another name for this test is coronary artery calcium scoring.  What are some common uses of the procedure? The goal of cardiac CT scan for calcium scoring is to determine if CAD is present and to what extent, even if there are no symptoms. It is a screening study that may be recommended by a physician for  patients with risk factors for CAD but no clinical symptoms. The major risk factors for CAD are: . high blood cholesterol levels  . family history of heart attacks  . diabetes  . high blood pressure  . cigarette smoking  . overweight or obese  . physical inactivity   A negative cardiac CT scan for calcium scoring shows no calcification within the coronary arteries. This suggests that CAD is absent or so minimal it cannot be seen by this technique. The chance of having a heart attack over the next two to five years is very low under these circumstances. A positive test means that CAD is present, regardless of whether or not the patient is experiencing any symptoms. The amount of calcification-expressed as the calcium score-may help to predict the likelihood of a myocardial infarction (heart attack) in the coming years and helps your medical doctor or cardiologist decide whether the patient may need to take preventive medicine or undertake other measures such as diet and exercise to lower the risk for heart attack. The extent of CAD is graded according to your calcium score:  Calcium Score  Presence of CAD (coronary artery disease)  0 No evidence of CAD   1-10 Minimal evidence of CAD  11-100 Mild evidence of CAD  101-400 Moderate evidence of CAD  Over 400 Extensive evidence of CAD      These suggestions will probably help you to improve your metabolism if you are not overweight and to lose weight if you are overweight: 1.  Reduce your consumption of sugars and starches.  Eliminate high  fructose corn syrup from your diet.  Reduce your consumption of processed foods.  For desserts try to have seasonal fruits, berries, nuts, cheeses or dark chocolate with more than 70% cacao. 2.  Do not snack 3.  You do not have to eat breakfast.  If you choose to have breakfast-eat plain greek yogurt, eggs, oatmeal (without sugar) 4.  Drink water, freshly brewed unsweetened tea (green, black or herbal) or  coffee.  Do not drink sodas including diet sodas , juices, beverages sweetened with artificial sweeteners. 5.  Reduce your consumption of refined grains. 6.  Avoid protein drinks such as Optifast, Slim fast etc. Eat chicken, fish, meat, dairy and beans for your sources of protein 7.  Natural unprocessed fats like cold pressed virgin olive oil, butter, coconut oil are good for you.  Eat avocados 8.  Increase your consumption of fiber.  Fruits, berries, vegetables, whole grains, flaxseeds, Chia seeds, beans, popcorn, nuts, oatmeal are good sources of fiber 9.  Use vinegar in your diet, i.e. apple cider vinegar, red wine or balsamic vinegar 10.  You can try fasting.  For example you can skip breakfast and lunch every other day (24-hour fast) 11.  Stress reduction, good night sleep, relaxation, meditation, yoga and other physical activity is likely to help you to maintain low weight too. 12.  If you drink alcohol, limit your alcohol intake to no more than 2 drinks a day.     Cabbage soup recipe that will not make you gain weight: Take 1 small head of cabbage, 1 average pack of celery, 4 green peppers, 4 onions, 2 cans diced tomatoes (they are not available without salt), salt and spices to taste.  Chop cabbage, celery, peppers and onions.  And tomatoes and 2-2.5 liters (2.5 quarts) of water so that it would just cover the vegetables.  Bring to boil.  Add spices and salt.  Turn heat to low/medium and simmer for 20-25 minutes.  Naturally, you can make a smaller batch and change some of the ingredients.    If you have medicare related insurance (such as traditional Medicare, [employer], [employer], or similar), Please make an appointment at the scheduling desk with Sharee Pimple, the [employer], for your Wellness visit in this office, which is a benefit with your insurance.

## 2019-08-22 NOTE — Assessment & Plan Note (Signed)
Fasting discussed 

## 2019-08-22 NOTE — Assessment & Plan Note (Signed)
  A cardiac CT scan for calcium scoring offered  Losartan HCT

## 2019-09-04 NOTE — Progress Notes (Signed)
Subjective:   Regina Roberts is a 68 y.o. female who presents for Medicare Annual (Subsequent) preventive examination. I connected with patient by a telephone and verified that I am speaking with the correct person using two identifiers. Patient stated full name and DOB. Patient gave permission to continue with telephonic visit. Patient's location was at home and Nurse's location was at [place] office. Participants during this visit included patient and nurse.  Review of Systems:   Cardiac Risk Factors include: advanced age (>57men, >24 women);dyslipidemia;hypertension Sleep patterns: feels rested on waking, gets up 0 times nightly to void and sleeps 6-7 hours nightly.    Home Safety/Smoke Alarms: Feels safe in home. Smoke alarms in place.  Living environment; residence and Firearm Safety: 1-story house/ trailer. Lives with husband, no needs for DME, good support system Seat Belt Safety/Bike Helmet: Wears seat belt.     Objective:     Vitals: There were no vitals taken for this visit.  There is no height or weight on file to calculate BMI.  Advanced Directives 09/05/2019 08/27/2018 07/20/2017  Does Patient Have a Medical Advance Directive? No No No  Does patient want to make changes to medical advance directive? No - Patient declined Yes (ED - Information included in AVS) -    Tobacco Social History   Tobacco Use  Smoking Status Never Smoker  Smokeless Tobacco Never Used     Counseling given: Not Answered  Past Medical History:  Diagnosis Date  . Allergy   . Anemia   . Blood transfusion without reported diagnosis   . GERD (gastroesophageal reflux disease)   . Hyperlipidemia   . Hypertension   . Osteopenia 10/2018   T score -1.8 FRAX 3.4% / 0.3% able from prior DEXA   Past Surgical History:  Procedure Laterality Date  . HERNIA REPAIR    . PELVIC LAPAROSCOPY  1996  . SUPRACERVICAL ABDOMINAL HYSTERECTOMY  2004   with RSO. Leiomyoma menorrhagia with 16-18 week size  uterus   Family History  Problem Relation Age of Onset  . Heart disease Mother 2  . Cancer Mother 88       neck ca   . Stroke Father 31  . Other Brother        train accident  . Other Brother        respiratory  . Other Brother        liver failure ?  Marland Kitchen Other Daughter        suicide  . Breast cancer Maternal Grandmother        untreated  . Colon cancer Neg Hx   . Esophageal cancer Neg Hx   . Pancreatic cancer Neg Hx   . Rectal cancer Neg Hx   . Stomach cancer Neg Hx    Social History   Socioeconomic History  . Marital status: Married    Spouse name: Not on file  . Number of children: 1  . Years of education: Not on file  . Highest education level: Not on file  Occupational History  . Occupation: retired  Scientific laboratory technician  . Financial resource strain: Not hard at all  . Food insecurity    Worry: Never true    Inability: Never true  . Transportation needs    Medical: No    Non-medical: No  Tobacco Use  . Smoking status: Never Smoker  . Smokeless tobacco: Never Used  Substance and Sexual Activity  . Alcohol use: Yes    Comment: rarely   .  Drug use: No  . Sexual activity: Yes    Birth control/protection: Surgical    Comment: 1st intercourse 64 yo-5 partners  Lifestyle  . Physical activity    Days per week: 2 days    Minutes per session: 40 min  . Stress: Not at all  Relationships  . Social connections    Talks on phone: More than three times a week    Gets together: More than three times a week    Attends religious service: Not on file    Active member of club or organization: Not on file    Attends meetings of clubs or organizations: Not on file    Relationship status: Married  Other Topics Concern  . Not on file  Social History Narrative  . Not on file    Outpatient Encounter Medications as of 09/05/2019  Medication Sig  . atorvastatin (LIPITOR) 10 MG tablet Take 1 tablet (10 mg total) by mouth daily.  Marland Kitchen b complex vitamins tablet Take 1 tablet by  mouth daily.  Marland Kitchen CALCIUM PO Take by mouth daily. OTC  . Cholecalciferol (VITAMIN D-3) 1000 UNITS CAPS Take 1 capsule by mouth daily.  . Cyanocobalamin ([employer] VITAMIN B12 PO) Take 1 tablet by mouth daily.  . fexofenadine (ALLEGRA ALLERGY) 180 MG tablet Take 1 tablet (180 mg total) by mouth daily.  . hydrochlorothiazide (HYDRODIURIL) 12.5 MG tablet Take 1 tablet (12.5 mg total) by mouth daily.  Marland Kitchen losartan (COZAAR) 100 MG tablet Take 1 tablet (100 mg total) by mouth daily.  . montelukast (SINGULAIR) 10 MG tablet Take 1 tablet (10 mg total) by mouth daily. Annual appt due in April must see provider for future refills  . Multiple Vitamins-Minerals (CENTRUM ADULTS) TABS Take 1 tablet by mouth daily.  . naproxen sodium (ANAPROX) 220 MG tablet Take 220 mg by mouth as needed.  . Omega-3 Fatty Acids (FISH OIL PO) Take by mouth.  . pantoprazole (PROTONIX) 40 MG tablet Take 1 tablet (40 mg total) by mouth daily.  Marland Kitchen triamcinolone ointment (KENALOG) 0.5 % apply to affected area twice a day  . [DISCONTINUED] losartan-hydrochlorothiazide (HYZAAR) 100-12.5 MG tablet Take 1 tablet by mouth daily. (Patient not taking: Reported on 09/05/2019)   No facility-administered encounter medications on file as of 09/05/2019.     Activities of Daily Living In your present state of health, do you have any difficulty performing the following activities: 09/05/2019  Hearing? N  Vision? N  Difficulty concentrating or making decisions? N  Walking or climbing stairs? N  Dressing or bathing? N  Doing errands, shopping? N  Preparing Food and eating ? N  Using the Toilet? N  In the past six months, have you accidently leaked urine? N  Do you have problems with loss of bowel control? N  Managing your Medications? N  Managing your Finances? N  Housekeeping or managing your Housekeeping? N  Some recent data might be hidden    Patient Care Team: Plotnikov, Evie Lacks, MD as PCP - General Fontaine, Belinda Block, MD as Consulting  Physician (Gynecology) Pyrtle, Lajuan Lines, MD as Consulting Physician (Gastroenterology)    Assessment:   This is a routine wellness examination for [facility] Physical assessment deferred to PCP.   Exercise Activities and Dietary recommendations Current Exercise Habits: Home exercise routine, Type of exercise: walking;stretching;calisthenics, Time (Minutes): 30, Frequency (Times/Week): 5, Weekly Exercise (Minutes/Week): 150, Intensity: Mild, Exercise limited by: orthopedic condition(s)  Diet (meal preparation, eat out, water intake, caffeinated beverages, dairy products, fruits and  vegetables): in general, a "healthy" diet  , well balanced. eats a variety of fruits and vegetables daily, limits salt, fat/cholesterol, sugar,carbohydrates,caffeine, drinks 6-8 glasses of water daily.  Discussed weight loss strategies and diet. Relevant patient education assigned to patient using Emmi.    Goals    . Patient Stated     I want to start to go to the park more often and enjoy the tranquility of nature and peace. Continue to collect and enjoy angels.    . Patient Stated     I want to lose weight by exercising routinely and eating healthy.       Fall Risk Fall Risk  09/05/2019 08/27/2018 08/21/2018 08/17/2017  Falls in the past year? 0 0 No No  Number falls in past yr: 0 - - -  Injury with Fall? 0 - - -    Depression Screen PHQ 2/9 Scores 09/05/2019 08/27/2018 08/21/2018 08/17/2017  PHQ - 2 Score 0 0 0 0     Cognitive Function       Ad8 score reviewed for issues:  Issues making decisions: no  Less interest in hobbies / activities: no  Repeats questions, stories (family complaining): no  Trouble using ordinary gadgets (microwave, computer, phone):no  Forgets the month or year: no  Mismanaging finances: no  Remembering appts: no  Daily problems with thinking and/or memory: no Ad8 score is= 0  Immunization History  Administered Date(s) Administered  . Influenza Whole  07/16/2008  . Influenza, High Dose Seasonal PF 07/16/2015, 08/03/2018  . Influenza,inj,Quad PF,6+ Mos 07/22/2016  . Influenza-Unspecified 08/13/2014, 09/07/2017  . Pneumococcal Conjugate-13 08/27/2018  . Tdap 08/01/2016   Screening Tests Health Maintenance  Topic Date Due  . Hepatitis C Screening  1951/08/31  . PNA vac Low Risk Adult (2 of 2 - PPSV23) 08/28/2019  . MAMMOGRAM  08/31/2020  . TETANUS/TDAP  08/01/2026  . COLONOSCOPY  08/04/2027  . INFLUENZA VACCINE  Completed  . DEXA SCAN  Completed      Plan:      Reviewed health maintenance screenings with patient today and relevant education, vaccines, and/or referrals were provided.   Continue to eat heart healthy diet (full of fruits, vegetables, whole grains, lean protein, water--limit salt, fat, and sugar intake) and increase physical activity as tolerated.  Continue doing brain stimulating activities (puzzles, reading, adult coloring books, staying active) to keep memory sharp.   I have personally reviewed and noted the following in the patient's chart:   . Medical and social history . Use of alcohol, tobacco or illicit drugs  . Current medications and supplements . Functional ability and status . Nutritional status . Physical activity . Advanced directives . List of other physicians . Screenings to include cognitive, depression, and falls . Referrals and appointments  In addition, I have reviewed and discussed with patient certain preventive protocols, quality metrics, and best practice recommendations. A written personalized care plan for preventive services as well as general preventive health recommendations were provided to patient.     Michiel Cowboy, RN  09/05/2019

## 2019-09-05 ENCOUNTER — Other Ambulatory Visit: Payer: Self-pay

## 2019-09-05 ENCOUNTER — Ambulatory Visit (INDEPENDENT_AMBULATORY_CARE_PROVIDER_SITE_OTHER): Payer: Medicare Other | Admitting: *Deleted

## 2019-09-05 ENCOUNTER — Ambulatory Visit
Admission: RE | Admit: 2019-09-05 | Discharge: 2019-09-05 | Disposition: A | Discharge status: Home / Self Care | Payer: Medicare Other | Source: Ambulatory Visit | Attending: Gynecology | Admitting: Gynecology

## 2019-09-05 DIAGNOSIS — Z0000 Encounter for general adult medical examination without abnormal findings: Secondary | ICD-10-CM | POA: Diagnosis not present

## 2019-09-05 DIAGNOSIS — Z1231 Encounter for screening mammogram for malignant neoplasm of breast: Secondary | ICD-10-CM

## 2019-09-16 ENCOUNTER — Other Ambulatory Visit: Payer: Self-pay

## 2019-09-17 ENCOUNTER — Encounter: Payer: Self-pay | Admitting: Gynecology

## 2019-09-17 ENCOUNTER — Ambulatory Visit (INDEPENDENT_AMBULATORY_CARE_PROVIDER_SITE_OTHER): Payer: Medicare Other | Admitting: Gynecology

## 2019-09-17 VITALS — BP 122/82 | Ht 59.0 in | Wt 188.0 lb

## 2019-09-17 DIAGNOSIS — N952 Postmenopausal atrophic vaginitis: Secondary | ICD-10-CM

## 2019-09-17 DIAGNOSIS — Z01419 Encounter for gynecological examination (general) (routine) without abnormal findings: Secondary | ICD-10-CM

## 2019-09-17 DIAGNOSIS — N8185 Cervical stump prolapse: Secondary | ICD-10-CM | POA: Diagnosis not present

## 2019-09-17 DIAGNOSIS — B029 Zoster without complications: Secondary | ICD-10-CM | POA: Diagnosis not present

## 2019-09-17 DIAGNOSIS — M8580 Other specified disorders of bone density and structure, unspecified site: Secondary | ICD-10-CM

## 2019-09-17 DIAGNOSIS — B019 Varicella without complication: Secondary | ICD-10-CM

## 2019-09-17 MED ORDER — VALACYCLOVIR HCL 1 G PO TABS: 1000.0000 mg | ORAL_TABLET | Freq: Three times a day (TID) | ORAL | 0 refills | Status: DC

## 2019-09-17 NOTE — Progress Notes (Signed)
    [place] Jan 07, 1951 [id number]        68 y.o.  G2P0011 for annual gynecologic exam.  Notes rash on face starting about a week ago.  Started with tingling.  Not uncomfortable.  No visual changes  Past medical history,surgical history, problem list, medications, allergies, family history and social history were all reviewed and documented as reviewed in the EPIC chart.  ROS:  Performed with pertinent positives and negatives included in the history, assessment and plan.   Additional significant findings : None   Exam: [name] Vitals:   09/17/19 1501  BP: 122/82  Weight: 188 lb (85.3 kg)  Height: 4\' 11"  (1.499 m)   Body mass index is 37.97 kg/m.  General appearance:  Normal affect, orientation and appearance. Skin: Grossly normal excepting shingle type rash above right eye on the forehead HEENT: Without gross lesions.  No cervical or supraclavicular adenopathy. Thyroid normal.  Lungs:  Clear without wheezing, rales or rhonchi Cardiac: RR, without RMG Abdominal:  Soft, nontender, without masses, guarding, rebound, organomegaly or hernia Breasts:  Examined lying and sitting without masses, retractions, discharge or axillary adenopathy. Pelvic:  Ext, BUS, Vagina: With atrophic changes.  Cervix: With atrophic changes.  Ring pessary removed cleansed and replaced.  No irritation or erosion.  Adnexa: Without masses or tenderness    Anus and perineum: Normal   Rectovaginal: Normal sphincter tone without palpated masses or tenderness.    Assessment/Plan:  68 y.o. G41P0011 female for annual gynecologic exam.  Status post supracervical hysterectomy  1. Cervical stump prolapse.  Using ring pessary with good results.  She is able to remove it and clean it herself. 2. Shingles above right eye.  Classic in appearance.  Will start Valtrex 1 g 3 times daily for 7 days.  If any visual issues she knows to see ophthalmologist ASAP.  Recommended Shingrix vaccine after  this rash totally clears 3. Colonoscopy 2018.  Repeat at their recommended interval. 4. Pap smear 09/2017.  No Pap smear done today.  No history of significant abnormal Pap smears.  Options to stop screening per current screening guidelines versus less frequent screening intervals reviewed.  Will readdress on annual basis. 5. Mammography 08/2019.  Continue with annual mammography next year.  Breast exam normal today. 6. Osteopenia.  DEXA 10/2018 T score -1.8 FRAX 3.4% / 0.3%.  Stable from prior DEXA.  Plan repeat DEXA in several years. 7. Health maintenance.  No routine lab work done as patient does this elsewhere.  Follow-up 1 year, sooner as needed.   Anastasio Auerbach MD, 3:21 PM 09/17/2019

## 2019-09-17 NOTE — Patient Instructions (Signed)
Start the Valtrex medication 3 times daily for the next 7 days.  Follow-up if the rash continues or spreads particularly towards the eye.  Get the Shingrix vaccine once the rash has totally cleared  Follow-up in 1 year for annual exam

## 2019-09-18 ENCOUNTER — Other Ambulatory Visit: Payer: Self-pay | Admitting: Internal Medicine

## 2019-12-17 ENCOUNTER — Other Ambulatory Visit: Payer: Self-pay | Admitting: Internal Medicine

## 2020-02-14 ENCOUNTER — Other Ambulatory Visit: Payer: Self-pay | Admitting: Internal Medicine

## 2020-02-19 ENCOUNTER — Other Ambulatory Visit: Payer: Self-pay

## 2020-02-19 ENCOUNTER — Ambulatory Visit (INDEPENDENT_AMBULATORY_CARE_PROVIDER_SITE_OTHER): Payer: Medicare Other | Admitting: Internal Medicine

## 2020-02-19 ENCOUNTER — Encounter: Payer: Self-pay | Admitting: Internal Medicine

## 2020-02-19 DIAGNOSIS — I10 Essential (primary) hypertension: Secondary | ICD-10-CM | POA: Diagnosis not present

## 2020-02-19 DIAGNOSIS — K219 Gastro-esophageal reflux disease without esophagitis: Secondary | ICD-10-CM

## 2020-02-19 DIAGNOSIS — E669 Obesity, unspecified: Secondary | ICD-10-CM | POA: Diagnosis not present

## 2020-02-19 MED ORDER — TRIAMCINOLONE ACETONIDE 0.5 % EX OINT: TOPICAL_OINTMENT | CUTANEOUS | 3 refills | Status: AC

## 2020-02-19 MED ORDER — HYDROCHLOROTHIAZIDE 12.5 MG PO TABS: ORAL_TABLET | ORAL | 3 refills | Status: DC

## 2020-02-19 MED ORDER — MONTELUKAST SODIUM 10 MG PO TABS: 10.0000 mg | ORAL_TABLET | Freq: Every day | ORAL | 3 refills | Status: DC

## 2020-02-19 MED ORDER — LOSARTAN POTASSIUM 100 MG PO TABS: 100.0000 mg | ORAL_TABLET | Freq: Every day | ORAL | 3 refills | Status: DC

## 2020-02-19 MED ORDER — PANTOPRAZOLE SODIUM 40 MG PO TBEC: DELAYED_RELEASE_TABLET | ORAL | 3 refills | Status: DC

## 2020-02-19 MED ORDER — ATORVASTATIN CALCIUM 10 MG PO TABS: 10.0000 mg | ORAL_TABLET | Freq: Every day | ORAL | 3 refills | Status: DC

## 2020-02-19 NOTE — Progress Notes (Signed)
Subjective:  Patient ID: Regina Roberts, female    DOB: 01/09/1951  Age: 69 y.o. MRN: [id number]  CC: No chief complaint on file.   HPI [name] presents for HTN, B12 def Exercising a lot, walking up to 14 000 steps a day  Outpatient Medications Prior to Visit  Medication Sig Dispense Refill  . atorvastatin (LIPITOR) 10 MG tablet Take 1 tablet (10 mg total) by mouth daily. 90 tablet 3  . b complex vitamins tablet Take 1 tablet by mouth daily. 100 tablet 3  . CALCIUM PO Take by mouth daily. OTC    . Cholecalciferol (VITAMIN D-3) 1000 UNITS CAPS Take 1 capsule by mouth daily.    . Cyanocobalamin ([facility]S VITAMIN B12 PO) Take 1 tablet by mouth daily.    . fexofenadine (ALLEGRA ALLERGY) 180 MG tablet Take 1 tablet (180 mg total) by mouth daily. 100 tablet 3  . hydrochlorothiazide (HYDRODIURIL) 12.5 MG tablet TAKE 1 TABLET(12.5 MG) BY MOUTH DAILY 90 tablet 3  . losartan (COZAAR) 100 MG tablet TAKE 1 TABLET(100 MG) BY MOUTH DAILY 90 tablet 3  . montelukast (SINGULAIR) 10 MG tablet Take 1 tablet (10 mg total) by mouth daily. Annual appt due in April must see provider for future refills 90 tablet 3  . Multiple Vitamins-Minerals (CENTRUM ADULTS) TABS Take 1 tablet by mouth daily.    . naproxen sodium (ANAPROX) 220 MG tablet Take 220 mg by mouth as needed.    . Omega-3 Fatty Acids (FISH OIL PO) Take by mouth.    . pantoprazole (PROTONIX) 40 MG tablet TAKE 1 TABLET(40 MG) BY MOUTH DAILY 30 tablet 11  . triamcinolone ointment (KENALOG) 0.5 % apply to affected area twice a day 60 g 3  . valACYclovir (VALTREX) 1000 MG tablet Take 1 tablet (1,000 mg total) by mouth 3 (three) times daily. For 7 days 21 tablet 0   No facility-administered medications prior to visit.    ROS: Review of Systems  Constitutional: Negative for activity change, appetite change, chills and fatigue.  HENT: Negative for congestion, mouth sores and sinus pressure.   Eyes: Negative for visual disturbance.    Respiratory: Negative for cough and chest tightness.   Gastrointestinal: Negative for abdominal pain and nausea.  Genitourinary: Negative for difficulty urinating, frequency and vaginal pain.  Musculoskeletal: Negative for back pain and gait problem.  Skin: Negative for pallor and rash.  Neurological: Negative for dizziness, tremors, weakness, numbness and headaches.  Psychiatric/Behavioral: Negative for confusion and sleep disturbance.    Objective:  BP 134/82 (BP Location: Left Arm, Patient Position: Sitting, Cuff Size: Large)   Pulse 99   Temp 98.5 F (36.9 C) (Oral)   Ht 4\' 11"  (1.499 m)   Wt 193 lb (87.5 kg)   SpO2 97%   BMI 38.98 kg/m   BP Readings from Last 3 Encounters:  02/19/20 134/82  09/17/19 122/82  08/22/19 122/74    Wt Readings from Last 3 Encounters:  02/19/20 193 lb (87.5 kg)  09/17/19 188 lb (85.3 kg)  08/22/19 190 lb (86.2 kg)    Physical Exam Constitutional:      General: She is not in acute distress.    Appearance: She is well-developed. She is obese.  HENT:     Head: Normocephalic.     Right Ear: External ear normal.     Left Ear: External ear normal.     Nose: Nose normal.  Eyes:     General:  Right eye: No discharge.        Left eye: No discharge.     Conjunctiva/sclera: Conjunctivae normal.     Pupils: Pupils are equal, round, and reactive to light.  Neck:     Thyroid: No thyromegaly.     Vascular: No JVD.     Trachea: No tracheal deviation.  Cardiovascular:     Rate and Rhythm: Normal rate and regular rhythm.     Heart sounds: Normal heart sounds.  Pulmonary:     Effort: No respiratory distress.     Breath sounds: No stridor. No wheezing.  Abdominal:     General: Bowel sounds are normal. There is no distension.     Palpations: Abdomen is soft. There is no mass.     Tenderness: There is no abdominal tenderness. There is no guarding or rebound.  Musculoskeletal:        General: No tenderness.     Cervical back: Normal  range of motion and neck supple.  Lymphadenopathy:     Cervical: No cervical adenopathy.  Skin:    Findings: No erythema or rash.  Neurological:     Cranial Nerves: No cranial nerve deficit.     Motor: No abnormal muscle tone.     Coordination: Coordination normal.     Deep Tendon Reflexes: Reflexes normal.  Psychiatric:        Behavior: Behavior normal.        Thought Content: Thought content normal.        Judgment: Judgment normal.     Lab Results  Component Value Date   WBC 15.6 (H) 05/21/2018   HGB 12.5 05/21/2018   HCT 36.8 05/21/2018   PLT 378.0 05/21/2018   GLUCOSE 113 (H) 02/20/2019   CHOL 170 02/20/2019   TRIG 121.0 02/20/2019   HDL 39.20 02/20/2019   LDLCALC 107 (H) 02/20/2019   ALT 14 02/20/2019   AST 11 02/20/2019   NA 139 02/20/2019   K 3.9 02/20/2019   CL 102 02/20/2019   CREATININE 0.99 02/20/2019   BUN 13 02/20/2019   CO2 29 02/20/2019   TSH 1.62 05/21/2018    MM 3D SCREEN BREAST BILATERAL  Result Date: 09/06/2019 CLINICAL DATA:  Screening. EXAM: DIGITAL SCREENING BILATERAL MAMMOGRAM WITH TOMO AND CAD COMPARISON:  Previous exam(s). ACR Breast Density Category b: There are scattered areas of fibroglandular density. FINDINGS: There are no findings suspicious for malignancy. Images were processed with CAD. IMPRESSION: No mammographic evidence of malignancy. A result letter of this screening mammogram will be mailed directly to the patient. RECOMMENDATION: Screening mammogram in one year. (Code:SM-B-01Y) BI-RADS CATEGORY  1: Negative. Electronically Signed   By: Curlene Dolphin M.D.   On: 09/06/2019 16:47    Assessment & Plan:   There are no diagnoses linked to this encounter.   No orders of the defined types were placed in this encounter.    Follow-up: No follow-ups on file.  Walker Kehr, MD

## 2020-02-19 NOTE — Assessment & Plan Note (Signed)
Losartan HCT 

## 2020-02-19 NOTE — Assessment & Plan Note (Signed)
Protonix.  ?

## 2020-02-19 NOTE — Assessment & Plan Note (Signed)
Diet discussed Exercising a lot, walking up to 14 000 steps a day

## 2020-02-22 ENCOUNTER — Other Ambulatory Visit: Payer: Self-pay | Admitting: Internal Medicine

## 2020-03-16 ENCOUNTER — Other Ambulatory Visit (INDEPENDENT_AMBULATORY_CARE_PROVIDER_SITE_OTHER): Payer: Medicare Other

## 2020-03-16 DIAGNOSIS — I10 Essential (primary) hypertension: Secondary | ICD-10-CM | POA: Diagnosis not present

## 2020-03-16 DIAGNOSIS — E669 Obesity, unspecified: Secondary | ICD-10-CM

## 2020-03-16 LAB — URINALYSIS, ROUTINE W REFLEX MICROSCOPIC
Bilirubin Urine: NEGATIVE
Ketones, ur: NEGATIVE
Nitrite: NEGATIVE
Specific Gravity, Urine: 1.025 (ref 1.000–1.030)
Total Protein, Urine: NEGATIVE
Urine Glucose: NEGATIVE
Urobilinogen, UA: 0.2 (ref 0.0–1.0)
pH: 6 (ref 5.0–8.0)

## 2020-03-16 LAB — HEPATIC FUNCTION PANEL
ALT: 15 U/L (ref 0–35)
AST: 13 U/L (ref 0–37)
Albumin: 4.2 g/dL (ref 3.5–5.2)
Alkaline Phosphatase: 59 U/L (ref 39–117)
Bilirubin, Direct: 0.1 mg/dL (ref 0.0–0.3)
Total Bilirubin: 0.5 mg/dL (ref 0.2–1.2)
Total Protein: 7.4 g/dL (ref 6.0–8.3)

## 2020-03-16 LAB — CBC WITH DIFFERENTIAL/PLATELET
Basophils Absolute: 0.1 10*3/uL (ref 0.0–0.1)
Basophils Relative: 0.4 % (ref 0.0–3.0)
Eosinophils Absolute: 0.2 10*3/uL (ref 0.0–0.7)
Eosinophils Relative: 1.6 % (ref 0.0–5.0)
HCT: 37.4 % (ref 36.0–46.0)
Hemoglobin: 12.2 g/dL (ref 12.0–15.0)
Lymphocytes Relative: 22.6 % (ref 12.0–46.0)
Lymphs Abs: 3.2 10*3/uL (ref 0.7–4.0)
MCHC: 32.8 g/dL (ref 30.0–36.0)
MCV: 87.1 fl (ref 78.0–100.0)
Monocytes Absolute: 1.6 10*3/uL — ABNORMAL HIGH (ref 0.1–1.0)
Monocytes Relative: 11.3 % (ref 3.0–12.0)
Neutro Abs: 9.1 10*3/uL — ABNORMAL HIGH (ref 1.4–7.7)
Neutrophils Relative %: 64.1 % (ref 43.0–77.0)
Platelets: 351 10*3/uL (ref 150.0–400.0)
RBC: 4.29 Mil/uL (ref 3.87–5.11)
RDW: 15.3 % (ref 11.5–15.5)
WBC: 14.2 10*3/uL — ABNORMAL HIGH (ref 4.0–10.5)

## 2020-03-16 LAB — LIPID PANEL
Cholesterol: 158 mg/dL (ref 0–200)
HDL: 37.6 mg/dL — ABNORMAL LOW (ref >39.00)
LDL Cholesterol: 91 mg/dL (ref 0–99)
NonHDL: 120.16
Total CHOL/HDL Ratio: 4
Triglycerides: 145 mg/dL (ref 0.0–149.0)
VLDL: 29 mg/dL (ref 0.0–40.0)

## 2020-03-16 LAB — BASIC METABOLIC PANEL
BUN: 16 mg/dL (ref 6–23)
CO2: 29 mEq/L (ref 19–32)
Calcium: 10.2 mg/dL (ref 8.4–10.5)
Chloride: 104 mEq/L (ref 96–112)
Creatinine, Ser: 1.09 mg/dL (ref 0.40–1.20)
GFR: 60.39 mL/min (ref >60.00)
Glucose, Bld: 97 mg/dL (ref 70–99)
Potassium: 4.2 mEq/L (ref 3.5–5.1)
Sodium: 139 mEq/L (ref 135–145)

## 2020-03-16 LAB — TSH: TSH: 0.63 u[IU]/mL (ref 0.35–4.50)

## 2020-03-17 ENCOUNTER — Other Ambulatory Visit: Payer: Self-pay | Admitting: Internal Medicine

## 2020-03-17 MED ORDER — CEPHALEXIN 500 MG PO CAPS: 500.0000 mg | ORAL_CAPSULE | Freq: Four times a day (QID) | ORAL | 0 refills | Status: DC

## 2020-04-11 ENCOUNTER — Other Ambulatory Visit: Payer: Self-pay | Admitting: Internal Medicine

## 2020-07-28 ENCOUNTER — Other Ambulatory Visit: Payer: Self-pay | Admitting: Obstetrics and Gynecology

## 2020-07-28 DIAGNOSIS — Z1231 Encounter for screening mammogram for malignant neoplasm of breast: Secondary | ICD-10-CM

## 2020-09-07 ENCOUNTER — Ambulatory Visit (INDEPENDENT_AMBULATORY_CARE_PROVIDER_SITE_OTHER): Payer: Medicare Other

## 2020-09-07 ENCOUNTER — Ambulatory Visit (INDEPENDENT_AMBULATORY_CARE_PROVIDER_SITE_OTHER): Payer: Medicare Other | Admitting: Internal Medicine

## 2020-09-07 ENCOUNTER — Other Ambulatory Visit: Payer: Self-pay

## 2020-09-07 ENCOUNTER — Encounter: Payer: Self-pay | Admitting: Internal Medicine

## 2020-09-07 VITALS — BP 128/72 | HR 98 | Temp 99.8°F | Ht 59.0 in | Wt 190.5 lb

## 2020-09-07 VITALS — BP 128/72 | HR 98 | Temp 99.8°F | Ht 59.0 in | Wt 190.4 lb

## 2020-09-07 DIAGNOSIS — Z0000 Encounter for general adult medical examination without abnormal findings: Secondary | ICD-10-CM | POA: Insufficient documentation

## 2020-09-07 DIAGNOSIS — R739 Hyperglycemia, unspecified: Secondary | ICD-10-CM | POA: Diagnosis not present

## 2020-09-07 DIAGNOSIS — Z23 Encounter for immunization: Secondary | ICD-10-CM

## 2020-09-07 DIAGNOSIS — E785 Hyperlipidemia, unspecified: Secondary | ICD-10-CM

## 2020-09-07 DIAGNOSIS — D485 Neoplasm of uncertain behavior of skin: Secondary | ICD-10-CM | POA: Insufficient documentation

## 2020-09-07 LAB — URINALYSIS, ROUTINE W REFLEX MICROSCOPIC
Bilirubin Urine: NEGATIVE
Ketones, ur: NEGATIVE
Leukocytes,Ua: NEGATIVE
Nitrite: NEGATIVE
Specific Gravity, Urine: 1.025 (ref 1.000–1.030)
Total Protein, Urine: NEGATIVE
Urine Glucose: NEGATIVE
Urobilinogen, UA: 0.2 (ref 0.0–1.0)
pH: 6 (ref 5.0–8.0)

## 2020-09-07 LAB — COMPREHENSIVE METABOLIC PANEL
ALT: 14 U/L (ref 0–35)
AST: 14 U/L (ref 0–37)
Albumin: 4.3 g/dL (ref 3.5–5.2)
Alkaline Phosphatase: 63 U/L (ref 39–117)
BUN: 17 mg/dL (ref 6–23)
CO2: 31 mEq/L (ref 19–32)
Calcium: 10.2 mg/dL (ref 8.4–10.5)
Chloride: 101 mEq/L (ref 96–112)
Creatinine, Ser: 1.14 mg/dL (ref 0.40–1.20)
GFR: 49.46 mL/min — ABNORMAL LOW (ref >60.00)
Glucose, Bld: 115 mg/dL — ABNORMAL HIGH (ref 70–99)
Potassium: 4.1 mEq/L (ref 3.5–5.1)
Sodium: 139 mEq/L (ref 135–145)
Total Bilirubin: 0.5 mg/dL (ref 0.2–1.2)
Total Protein: 7.9 g/dL (ref 6.0–8.3)

## 2020-09-07 LAB — CBC WITH DIFFERENTIAL/PLATELET
Basophils Absolute: 0.1 10*3/uL (ref 0.0–0.1)
Basophils Relative: 0.5 % (ref 0.0–3.0)
Eosinophils Absolute: 0.2 10*3/uL (ref 0.0–0.7)
Eosinophils Relative: 1.1 % (ref 0.0–5.0)
HCT: 38.5 % (ref 36.0–46.0)
Hemoglobin: 12.5 g/dL (ref 12.0–15.0)
Lymphocytes Relative: 22.2 % (ref 12.0–46.0)
Lymphs Abs: 3 10*3/uL (ref 0.7–4.0)
MCHC: 32.5 g/dL (ref 30.0–36.0)
MCV: 86.7 fl (ref 78.0–100.0)
Monocytes Absolute: 1.1 10*3/uL — ABNORMAL HIGH (ref 0.1–1.0)
Monocytes Relative: 8.1 % (ref 3.0–12.0)
Neutro Abs: 9.3 10*3/uL — ABNORMAL HIGH (ref 1.4–7.7)
Neutrophils Relative %: 68.1 % (ref 43.0–77.0)
Platelets: 378 10*3/uL (ref 150.0–400.0)
RBC: 4.44 Mil/uL (ref 3.87–5.11)
RDW: 15.5 % (ref 11.5–15.5)
WBC: 13.6 10*3/uL — ABNORMAL HIGH (ref 4.0–10.5)

## 2020-09-07 LAB — HEMOGLOBIN A1C: Hgb A1c MFr Bld: 6.5 % (ref 4.6–6.5)

## 2020-09-07 LAB — LIPID PANEL
Cholesterol: 160 mg/dL (ref 0–200)
HDL: 41 mg/dL (ref >39.00)
LDL Cholesterol: 96 mg/dL (ref 0–99)
NonHDL: 119.26
Total CHOL/HDL Ratio: 4
Triglycerides: 117 mg/dL (ref 0.0–149.0)
VLDL: 23.4 mg/dL (ref 0.0–40.0)

## 2020-09-07 LAB — TSH: TSH: 1.05 u[IU]/mL (ref 0.35–4.50)

## 2020-09-07 NOTE — Addendum Note (Signed)
Addended by: Trenda Moots on: [id number] 10:34 AM   Modules accepted: Orders

## 2020-09-07 NOTE — Assessment & Plan Note (Signed)
Flat mobile NT cyst on forehead 11 mm - no change in years. Removal offered

## 2020-09-07 NOTE — Assessment & Plan Note (Addendum)
Mild A1c Wt loss 

## 2020-09-07 NOTE — Assessment & Plan Note (Addendum)
  We discussed age appropriate health related issues, including available/recomended screening tests and vaccinations. Labs were ordered to be later reviewed . All questions were answered. We discussed one or more of the following - seat belt use, use of sunscreen/sun exposure exercise, safe sex, fall risk reduction, second hand smoke exposure, firearm use and storage, seat belt use, a need for adhering to healthy diet and exercise. Labs were ordered.  All questions were answered. Colon due in 2028

## 2020-09-07 NOTE — Progress Notes (Signed)
Subjective:   Regina Roberts is a 69 y.o. female who presents for Medicare Annual (Subsequent) preventive examination.  Review of Systems    No ROS. Medicare Wellness Visit. Additional risk factors are reflected in social history.  Sleep Patterns: No sleep issues, feels rested on waking and sleeps 4-6 hours nightly. Home Safety/Smoke Alarms: Feels safe in home; uses home alarm. Smoke alarms in place. Living environment: 1-story home; Lives with spouse; no needs for DME; good support system. Seat Belt Safety/Bike Helmet: Wears seat belt. Cardiac Risk Factors include: advanced age (>59men, >55 women);dyslipidemia;family history of premature cardiovascular disease;hypertension;obesity (BMI >30kg/m2)     Objective:    Today's Vitals   09/07/20 1110  BP: 128/72  Pulse: 98  Temp: 99.8 F (37.7 C)  SpO2: 95%  Weight: 190 lb 7.6 oz (86.4 kg)  Height: 4\' 11"  (1.499 m)  PainSc: 0-No pain   Body mass index is 38.47 kg/m.  Advanced Directives 09/07/2020 09/05/2019 08/27/2018 07/20/2017  Does Patient Have a Medical Advance Directive? No No No No  Does patient want to make changes to medical advance directive? - No - Patient declined Yes (ED - Information included in AVS) -  Would patient like information on creating a medical advance directive? No - Patient declined - - -    Current Medications (verified) Outpatient Encounter Medications as of 09/07/2020  Medication Sig  . atorvastatin (LIPITOR) 10 MG tablet TAKE 1 TABLET BY MOUTH EVERY DAY*ANNUAL APPT DUE IN APRIL  . b complex vitamins tablet Take 1 tablet by mouth daily.  Marland Kitchen CALCIUM PO Take by mouth daily. OTC  . Cholecalciferol (VITAMIN D-3) 1000 UNITS CAPS Take 1 capsule by mouth daily.  . Cyanocobalamin ([employer] VITAMIN B12 PO) Take 1 tablet by mouth daily.  . fexofenadine (ALLEGRA ALLERGY) 180 MG tablet Take 1 tablet (180 mg total) by mouth daily.  . hydrochlorothiazide (HYDRODIURIL) 12.5 MG tablet TAKE 1 TABLET(12.5 MG)  BY MOUTH DAILY  . losartan (COZAAR) 100 MG tablet Take 1 tablet (100 mg total) by mouth daily.  . montelukast (SINGULAIR) 10 MG tablet TAKE 1 TABLET BY MOUTH EVERY DAY  . Multiple Vitamins-Minerals (CENTRUM ADULTS) TABS Take 1 tablet by mouth daily.  . naproxen sodium (ANAPROX) 220 MG tablet Take 220 mg by mouth as needed.  . Omega-3 Fatty Acids (FISH OIL PO) Take by mouth.  . pantoprazole (PROTONIX) 40 MG tablet TAKE 1 TABLET(40 MG) BY MOUTH DAILY  . triamcinolone ointment (KENALOG) 0.5 % apply to affected area twice a day  . [DISCONTINUED] pantoprazole (PROTONIX) 40 MG tablet TAKE 1 TABLET(40 MG) BY MOUTH DAILY. FOLLOW-UP APPOINTMENT DUE IN AUG   No facility-administered encounter medications on file as of 09/07/2020.    Allergies (verified) Lisinopril and Phentermine   History: Past Medical History:  Diagnosis Date  . Allergy   . Anemia   . Blood transfusion without reported diagnosis   . GERD (gastroesophageal reflux disease)   . Hyperlipidemia   . Hypertension   . Osteopenia 10/2018   T score -1.8 FRAX 3.4% / 0.3% able from prior DEXA   Past Surgical History:  Procedure Laterality Date  . HERNIA REPAIR    . PELVIC LAPAROSCOPY  1996  . SUPRACERVICAL ABDOMINAL HYSTERECTOMY  2004   with RSO. Leiomyoma menorrhagia with 16-18 week size uterus   Family History  Problem Relation Age of Onset  . Heart disease Mother 71  . Cancer Mother 41       neck ca   .  Stroke Father 59  . Other Brother        train accident  . Other Brother        respiratory  . Other Brother        liver failure ?  Marland Kitchen Other Daughter        suicide  . Breast cancer Maternal Grandmother        untreated  . Colon cancer Neg Hx   . Esophageal cancer Neg Hx   . Pancreatic cancer Neg Hx   . Rectal cancer Neg Hx   . Stomach cancer Neg Hx    Social History   Socioeconomic History  . Marital status: Married    Spouse name: Not on file  . Number of children: 1  . Years of education: Not on file    . Highest education level: Not on file  Occupational History  . Occupation: retired  Tobacco Use  . Smoking status: Never Smoker  . Smokeless tobacco: Never Used  Vaping Use  . Vaping Use: Never used  Substance and Sexual Activity  . Alcohol use: Yes    Comment: rarely   . Drug use: No  . Sexual activity: Yes    Birth control/protection: Surgical    Comment: 1st intercourse 36 yo-5 partners  Other Topics Concern  . Not on file  Social History Narrative  . Not on file   Social Determinants of Health   Financial Resource Strain: Low Risk   . Difficulty of Paying Living Expenses: Not hard at all  Food Insecurity: No Food Insecurity  . Worried About Charity fundraiser in the Last Year: Never true  . Ran Out of Food in the Last Year: Never true  Transportation Needs: No Transportation Needs  . Lack of Transportation (Medical): No  . Lack of Transportation (Non-Medical): No  Physical Activity: Sufficiently Active  . Days of Exercise per Week: 5 days  . Minutes of Exercise per Session: 30 min  Stress: No Stress Concern Present  . Feeling of Stress : Not at all  Social Connections: Socially Integrated  . Frequency of Communication with Friends and Family: More than three times a week  . Frequency of Social Gatherings with Friends and Family: Once a week  . Attends Religious Services: More than 4 times per year  . Active Member of Clubs or Organizations: Yes  . Attends Archivist Meetings: More than 4 times per year  . Marital Status: Married    Tobacco Counseling Counseling given: Not Answered   Clinical Intake:  Pre-visit preparation completed: Yes  Pain : No/denies pain Pain Score: 0-No pain     BMI - recorded: 38.47 Nutritional Status: BMI > 30  Obese Nutritional Risks: None Diabetes: No  How often do you need to have someone help you when you read instructions, pamphlets, or other written materials from your doctor or pharmacy?: 1 - Never What  is the last grade level you completed in school?: Associate's Degree  Diabetic? no  Interpreter Needed?: No  Information entered by :: Lisette Abu, LPN   Activities of Daily Living In your present state of health, do you have any difficulty performing the following activities: 09/07/2020  Hearing? N  Vision? N  Difficulty concentrating or making decisions? N  Walking or climbing stairs? N  Dressing or bathing? N  Doing errands, shopping? N  Preparing Food and eating ? N  Using the Toilet? N  In the past six months, have you accidently leaked  urine? N  Do you have problems with loss of bowel control? N  Managing your Medications? N  Managing your Finances? N  Housekeeping or managing your Housekeeping? N  Some recent data might be hidden    Patient Care Team: Plotnikov, Evie Lacks, MD as PCP - General Fontaine, Belinda Block, MD (Inactive) as Consulting Physician (Gynecology) Pyrtle, Lajuan Lines, MD as Consulting Physician (Gastroenterology)  Indicate any recent Medical Services you may have received from other than [facility] providers in the past year (date may be approximate).     Assessment:   This is a routine wellness examination for [name].  Hearing/Vision screen No exam data present  Dietary issues and exercise activities discussed: Current Exercise Habits: Home exercise routine, Type of exercise: treadmill;walking;strength training/weights;exercise ball, Time (Minutes): 30, Frequency (Times/Week): 5, Weekly Exercise (Minutes/Week): 150, Intensity: Moderate, Exercise limited by: None identified  Goals    .  Patient Stated      I want to start to go to the park more often and enjoy the tranquility of nature and peace. Continue to collect and enjoy angels.    .  Patient Stated (pt-stated)      I would like to lose weight. I see myself losing around 50 pounds.      Depression Screen PHQ 2/9 Scores 09/07/2020 09/05/2019 08/27/2018 08/21/2018 08/17/2017  PHQ - 2 Score 0  0 0 0 0    Fall Risk Fall Risk  09/07/2020 09/05/2019 08/27/2018 08/21/2018 08/17/2017  Falls in the past year? 0 0 0 No No  Number falls in past yr: 0 0 - - -  Injury with Fall? 0 0 - - -  Risk for fall due to : No Fall Risks - - - -  Follow up Falls evaluation completed;Education provided - - - -    Any stairs in or around the home? No  If so, are there any without handrails? No  Home free of loose throw rugs in walkways, pet beds, electrical cords, etc? Yes  Adequate lighting in your home to reduce risk of falls? Yes   ASSISTIVE DEVICES UTILIZED TO PREVENT FALLS:  Life alert? No  Use of a cane, walker or w/c? No  Grab bars in the bathroom? No  Shower chair or bench in shower? No  Elevated toilet seat or a handicapped toilet? Yes   TIMED UP AND GO:  Was the test performed? No .  Length of time to ambulate 10 feet: 0 sec.   Gait steady and fast without use of assistive device  Cognitive Function:     6CIT Screen 09/07/2020  What Year? 0 points  What month? 0 points  What time? 0 points  Count back from 20 0 points  Months in reverse 0 points  Repeat phrase 0 points  Total Score 0    Immunizations Immunization History  Administered Date(s) Administered  . Fluad Quad(high Dose 65+) 07/29/2019, 08/05/2020  . Influenza Whole 07/16/2008  . Influenza, High Dose Seasonal PF 07/16/2015, 08/03/2018  . Influenza,inj,Quad PF,6+ Mos 07/22/2016  . Influenza-Unspecified 08/13/2014, 09/07/2017  . PFIZER SARS-COV-2 Vaccination 12/21/2019, 01/11/2020, 08/06/2020  . Pneumococcal Conjugate-13 08/27/2018  . Pneumococcal Polysaccharide-23 09/07/2020  . Tdap 08/01/2016  . Zoster Recombinat (Shingrix) 11/19/2019, 06/20/2020    TDAP status: Up to date Flu Vaccine status: Up to date Pneumococcal vaccine status: Up to date Covid-19 vaccine status: Completed vaccines  Qualifies for Shingles Vaccine? Yes   Zostavax completed Yes   Shingrix Completed?: Yes  Screening  Tests Health  Maintenance  Topic Date Due  . Hepatitis C Screening  Never done  . MAMMOGRAM  09/04/2021  . TETANUS/TDAP  08/01/2026  . COLONOSCOPY  08/04/2027  . INFLUENZA VACCINE  Completed  . DEXA SCAN  Completed  . COVID-19 Vaccine  Completed  . PNA vac Low Risk Adult  Completed    Health Maintenance  Health Maintenance Due  Topic Date Due  . Hepatitis C Screening  Never done    Colorectal cancer screening: Completed 08/03/2017. Repeat every 10 years Mammogram status: Completed 09/09/2020. Repeat every year Bone Density status: Completed 11/20/2018. Results reflect: Bone density results: OSTEOPENIA. Repeat every 2 years.  Lung Cancer Screening: (Low Dose CT Chest recommended if Age 93-80 years, 30 pack-year currently smoking OR have quit w/in 15years.) does not qualify.   Lung Cancer Screening Referral: no  Additional Screening:  Hepatitis C Screening: does not qualify; Completed no  Vision Screening: Recommended annual ophthalmology exams for early detection of glaucoma and other disorders of the eye. Is the patient up to date with their annual eye exam?  No  Who is the provider or what is the name of the office in which the patient attends annual eye exams? Patient in process of looking for a new optometrist If pt is not established with a provider, would they like to be referred to a provider to establish care? No .   Dental Screening: Recommended annual dental exams for proper oral hygiene  Community Resource Referral / Chronic Care Management: CRR required this visit?  No   CCM required this visit?  No      Plan:     I have personally reviewed and noted the following in the patient's chart:   . Medical and social history . Use of alcohol, tobacco or illicit drugs  . Current medications and supplements . Functional ability and status . Nutritional status . Physical activity . Advanced directives . List of other physicians . Hospitalizations, surgeries,  and ER visits in previous 12 months . Vitals . Screenings to include cognitive, depression, and falls . Referrals and appointments  In addition, I have reviewed and discussed with patient certain preventive protocols, quality metrics, and best practice recommendations. A written personalized care plan for preventive services as well as general preventive health recommendations were provided to patient.     Sheral Flow, LPN   [id number]   Nurse Notes:

## 2020-09-07 NOTE — Patient Instructions (Signed)
Regina Roberts , Thank you for taking time to come for your Medicare Wellness Visit. I appreciate your ongoing commitment to your health goals. Please review the following plan we discussed and let me know if I can assist you in the future.   Screening recommendations/referrals: Colonoscopy: 08/03/2017; due every 10 years Mammogram: 09/09/2020 Bone Density: 11/20/2018; due every 2 years Recommended yearly ophthalmology/optometry visit for glaucoma screening and checkup Recommended yearly dental visit for hygiene and checkup  Vaccinations: Influenza vaccine: 08/05/2020 Pneumococcal vaccine: up to date Tdap vaccine: 08/01/2016; due every 10 years Shingles vaccine: up to date   Covid-19: up to date  Advanced directives: Advance directive discussed with you today. I have provided a copy for you to complete at home and have notarized. Once this is complete please bring a copy in to our office so we can scan it into your chart.  Conditions/risks identified: Yes; Reviewed health maintenance screenings with patient today and relevant education, vaccines, and/or referrals were provided. Please continue to do your personal lifestyle choices by: daily care of teeth and gums, regular physical activity (goal should be 5 days a week for 30 minutes), eat a healthy diet, avoid tobacco and drug use, limiting any alcohol intake, taking a low-dose aspirin (if not allergic or have been advised by your provider otherwise) and taking vitamins and minerals as recommended by your provider. Continue doing brain stimulating activities (puzzles, reading, adult coloring books, staying active) to keep memory sharp. Continue to eat heart healthy diet (full of fruits, vegetables, whole grains, lean protein, water--limit salt, fat, and sugar intake) and increase physical activity as tolerated.  Next appointment: Please schedule your next Medicare Wellness Visit with your Nurse Health Advisor in 1 year by calling  [phone].   Preventive Care 68 Years and Older, Female Preventive care refers to lifestyle choices and visits with your health care provider that can promote health and wellness. What does preventive care include?  A yearly physical exam. This is also called an annual well check.  Dental exams once or twice a year.  Routine eye exams. Ask your health care provider how often you should have your eyes checked.  Personal lifestyle choices, including:  Daily care of your teeth and gums.  Regular physical activity.  Eating a healthy diet.  Avoiding tobacco and drug use.  Limiting alcohol use.  Practicing safe sex.  Taking low-dose aspirin every day.  Taking vitamin and mineral supplements as recommended by your health care provider. What happens during an annual well check? The services and screenings done by your health care provider during your annual well check will depend on your age, overall health, lifestyle risk factors, and family history of disease. Counseling  Your health care provider may ask you questions about your:  Alcohol use.  Tobacco use.  Drug use.  Emotional well-being.  Home and relationship well-being.  Sexual activity.  Eating habits.  History of falls.  Memory and ability to understand (cognition).  Work and work Statistician.  Reproductive health. Screening  You may have the following tests or measurements:  Height, weight, and BMI.  Blood pressure.  Lipid and cholesterol levels. These may be checked every 5 years, or more frequently if you are over 41 years old.  Skin check.  Lung cancer screening. You may have this screening every year starting at age 43 if you have a 30-pack-year history of smoking and currently smoke or have quit within the past 15 years.  Fecal occult blood test (FOBT) of  the stool. You may have this test every year starting at age 49.  Flexible sigmoidoscopy or colonoscopy. You may have a  sigmoidoscopy every 5 years or a colonoscopy every 10 years starting at age 71.  Hepatitis C blood test.  Hepatitis B blood test.  Sexually transmitted disease (STD) testing.  Diabetes screening. This is done by checking your blood sugar (glucose) after you have not eaten for a while (fasting). You may have this done every 1-3 years.  Bone density scan. This is done to screen for osteoporosis. You may have this done starting at age 6.  Mammogram. This may be done every 1-2 years. Talk to your health care provider about how often you should have regular mammograms. Talk with your health care provider about your test results, treatment options, and if necessary, the need for more tests. Vaccines  Your health care provider may recommend certain vaccines, such as:  Influenza vaccine. This is recommended every year.  Tetanus, diphtheria, and acellular pertussis (Tdap, Td) vaccine. You may need a Td booster every 10 years.  Zoster vaccine. You may need this after age 66.  Pneumococcal 13-valent conjugate (PCV13) vaccine. One dose is recommended after age 80.  Pneumococcal polysaccharide (PPSV23) vaccine. One dose is recommended after age 27. Talk to your health care provider about which screenings and vaccines you need and how often you need them. This information is not intended to replace advice given to you by your health care provider. Make sure you discuss any questions you have with your health care provider. Document Released: 11/06/2015 Document Revised: 06/29/2016 Document Reviewed: 08/11/2015 Elsevier Interactive Patient Education  2017 [place] Prevention in the Home Falls can cause injuries. They can happen to people of all ages. There are many things you can do to make your home safe and to help prevent falls. What can I do on the outside of my home?  Regularly fix the edges of walkways and driveways and fix any cracks.  Remove anything that might make you  trip as you walk through a door, such as a raised step or threshold.  Trim any bushes or trees on the path to your home.  Use bright outdoor lighting.  Clear any walking paths of anything that might make someone trip, such as rocks or tools.  Regularly check to see if handrails are loose or broken. Make sure that both sides of any steps have handrails.  Any raised decks and porches should have guardrails on the edges.  Have any leaves, snow, or ice cleared regularly.  Use sand or salt on walking paths during winter.  Clean up any spills in your garage right away. This includes oil or grease spills. What can I do in the bathroom?  Use night lights.  Install grab bars by the toilet and in the tub and shower. Do not use towel bars as grab bars.  Use non-skid mats or decals in the tub or shower.  If you need to sit down in the shower, use a plastic, non-slip stool.  Keep the floor dry. Clean up any water that spills on the floor as soon as it happens.  Remove soap buildup in the tub or shower regularly.  Attach bath mats securely with double-sided non-slip rug tape.  Do not have throw rugs and other things on the floor that can make you trip. What can I do in the bedroom?  Use night lights.  Make sure that you have a light by your  bed that is easy to reach.  Do not use any sheets or blankets that are too big for your bed. They should not hang down onto the floor.  Have a firm chair that has side arms. You can use this for support while you get dressed.  Do not have throw rugs and other things on the floor that can make you trip. What can I do in the kitchen?  Clean up any spills right away.  Avoid walking on wet floors.  Keep items that you use a lot in easy-to-reach places.  If you need to reach something above you, use a strong step stool that has a grab bar.  Keep electrical cords out of the way.  Do not use floor polish or wax that makes floors slippery. If  you must use wax, use non-skid floor wax.  Do not have throw rugs and other things on the floor that can make you trip. What can I do with my stairs?  Do not leave any items on the stairs.  Make sure that there are handrails on both sides of the stairs and use them. Fix handrails that are broken or loose. Make sure that handrails are as long as the stairways.  Check any carpeting to make sure that it is firmly attached to the stairs. Fix any carpet that is loose or worn.  Avoid having throw rugs at the top or bottom of the stairs. If you do have throw rugs, attach them to the floor with carpet tape.  Make sure that you have a light switch at the top of the stairs and the bottom of the stairs. If you do not have them, ask someone to add them for you. What else can I do to help prevent falls?  Wear shoes that:  Do not have high heels.  Have rubber bottoms.  Are comfortable and fit you well.  Are closed at the toe. Do not wear sandals.  If you use a stepladder:  Make sure that it is fully opened. Do not climb a closed stepladder.  Make sure that both sides of the stepladder are locked into place.  Ask someone to hold it for you, if possible.  Clearly mark and make sure that you can see:  Any grab bars or handrails.  First and last steps.  Where the edge of each step is.  Use tools that help you move around (mobility aids) if they are needed. These include:  Canes.  Walkers.  Scooters.  Crutches.  Turn on the lights when you go into a dark area. Replace any light bulbs as soon as they burn out.  Set up your furniture so you have a clear path. Avoid moving your furniture around.  If any of your floors are uneven, fix them.  If there are any pets around you, be aware of where they are.  Review your medicines with your doctor. Some medicines can make you feel dizzy. This can increase your chance of falling. Ask your doctor what other things that you can do to  help prevent falls. This information is not intended to replace advice given to you by your health care provider. Make sure you discuss any questions you have with your health care provider. Document Released: 08/06/2009 Document Revised: 03/17/2016 Document Reviewed: 11/14/2014 Elsevier Interactive Patient Education  2017 [employer].

## 2020-09-07 NOTE — Addendum Note (Signed)
Addended by: Earnstine Regal on: 09/07/2020 10:41 AM   Modules accepted: Orders

## 2020-09-07 NOTE — Progress Notes (Signed)
Subjective:  Patient ID: Regina Roberts, female    DOB: [birth year]  Age: 69 y.o. MRN: [id number]  CC: Annual Exam   HPI [facility] presents for a well exam  Outpatient Medications Prior to Visit  Medication Sig Dispense Refill  . atorvastatin (LIPITOR) 10 MG tablet TAKE 1 TABLET BY MOUTH EVERY DAY*ANNUAL APPT DUE IN APRIL 90 tablet 3  . b complex vitamins tablet Take 1 tablet by mouth daily. 100 tablet 3  . CALCIUM PO Take by mouth daily. OTC    . Cholecalciferol (VITAMIN D-3) 1000 UNITS CAPS Take 1 capsule by mouth daily.    . Cyanocobalamin ([employer] VITAMIN B12 PO) Take 1 tablet by mouth daily.    . fexofenadine (ALLEGRA ALLERGY) 180 MG tablet Take 1 tablet (180 mg total) by mouth daily. 100 tablet 3  . hydrochlorothiazide (HYDRODIURIL) 12.5 MG tablet TAKE 1 TABLET(12.5 MG) BY MOUTH DAILY 90 tablet 3  . losartan (COZAAR) 100 MG tablet Take 1 tablet (100 mg total) by mouth daily. 90 tablet 3  . montelukast (SINGULAIR) 10 MG tablet TAKE 1 TABLET BY MOUTH EVERY DAY 90 tablet 3  . Multiple Vitamins-Minerals (CENTRUM ADULTS) TABS Take 1 tablet by mouth daily.    . naproxen sodium (ANAPROX) 220 MG tablet Take 220 mg by mouth as needed.    . Omega-3 Fatty Acids (FISH OIL PO) Take by mouth.    . pantoprazole (PROTONIX) 40 MG tablet TAKE 1 TABLET(40 MG) BY MOUTH DAILY 90 tablet 3  . triamcinolone ointment (KENALOG) 0.5 % apply to affected area twice a day 60 g 3  . cephALEXin (KEFLEX) 500 MG capsule Take 1 capsule (500 mg total) by mouth 4 (four) times daily. (Patient not taking: Reported on 09/07/2020) 12 capsule 0  . valACYclovir (VALTREX) 1000 MG tablet Take 1 tablet (1,000 mg total) by mouth 3 (three) times daily. For 7 days (Patient not taking: Reported on 09/07/2020) 21 tablet 0   No facility-administered medications prior to visit.    ROS: Review of Systems  Constitutional: Positive for unexpected weight change. Negative for activity change, appetite change, chills  and fatigue.  HENT: Negative for congestion, mouth sores and sinus pressure.   Eyes: Negative for visual disturbance.  Respiratory: Negative for cough and chest tightness.   Gastrointestinal: Negative for abdominal pain and nausea.  Genitourinary: Negative for difficulty urinating, frequency and vaginal pain.  Musculoskeletal: Negative for back pain and gait problem.  Skin: Negative for pallor and rash.  Neurological: Negative for dizziness, tremors, weakness, numbness and headaches.  Psychiatric/Behavioral: Negative for confusion and sleep disturbance.    Objective:  BP 128/72 (BP Location: Left Arm)   Pulse 98   Temp 99.8 F (37.7 C) (Oral)   Ht 4\' 11"  (1.499 m)   Wt 190 lb 6.4 oz (86.4 kg)   SpO2 95%   BMI 38.46 kg/m   BP Readings from Last 3 Encounters:  09/07/20 128/72  02/19/20 134/82  09/17/19 122/82    Wt Readings from Last 3 Encounters:  09/07/20 190 lb 6.4 oz (86.4 kg)  02/19/20 193 lb (87.5 kg)  09/17/19 188 lb (85.3 kg)    Physical Exam Constitutional:      General: She is not in acute distress.    Appearance: She is well-developed. She is obese.  HENT:     Head: Normocephalic.     Right Ear: External ear normal.     Left Ear: External ear normal.     Nose: Nose normal.  Eyes:     General:        Right eye: No discharge.        Left eye: No discharge.     Conjunctiva/sclera: Conjunctivae normal.     Pupils: Pupils are equal, round, and reactive to light.  Neck:     Thyroid: No thyromegaly.     Vascular: No JVD.     Trachea: No tracheal deviation.  Cardiovascular:     Rate and Rhythm: Normal rate and regular rhythm.     Heart sounds: Normal heart sounds.  Pulmonary:     Effort: No respiratory distress.     Breath sounds: No stridor. No wheezing.  Abdominal:     General: Bowel sounds are normal. There is no distension.     Palpations: Abdomen is soft. There is no mass.     Tenderness: There is no abdominal tenderness. There is no guarding or  rebound.  Musculoskeletal:        General: No tenderness.     Cervical back: Normal range of motion and neck supple.  Lymphadenopathy:     Cervical: No cervical adenopathy.  Skin:    Findings: No erythema or rash.  Neurological:     Mental Status: She is oriented to person, place, and time.     Cranial Nerves: No cranial nerve deficit.     Motor: No abnormal muscle tone.     Coordination: Coordination normal.     Deep Tendon Reflexes: Reflexes normal.  Psychiatric:        Behavior: Behavior normal.        Thought Content: Thought content normal.        Judgment: Judgment normal.     Flat mobile NT cyst on forehead 11 mm  Lab Results  Component Value Date   WBC 14.2 (H) 03/16/2020   HGB 12.2 03/16/2020   HCT 37.4 03/16/2020   PLT 351.0 03/16/2020   GLUCOSE 97 03/16/2020   CHOL 158 03/16/2020   TRIG 145.0 03/16/2020   HDL 37.60 (L) 03/16/2020   LDLCALC 91 03/16/2020   ALT 15 03/16/2020   AST 13 03/16/2020   NA 139 03/16/2020   K 4.2 03/16/2020   CL 104 03/16/2020   CREATININE 1.09 03/16/2020   BUN 16 03/16/2020   CO2 29 03/16/2020   TSH 0.63 03/16/2020    MM 3D SCREEN BREAST BILATERAL  Result Date: 09/06/2019 CLINICAL DATA:  Screening. EXAM: DIGITAL SCREENING BILATERAL MAMMOGRAM WITH TOMO AND CAD COMPARISON:  Previous exam(s). ACR Breast Density Category b: There are scattered areas of fibroglandular density. FINDINGS: There are no findings suspicious for malignancy. Images were processed with CAD. IMPRESSION: No mammographic evidence of malignancy. A result letter of this screening mammogram will be mailed directly to the patient. RECOMMENDATION: Screening mammogram in one year. (Code:SM-B-01Y) BI-RADS CATEGORY  1: Negative. Electronically Signed   By: Curlene Dolphin M.D.   On: 09/06/2019 16:47    Assessment & Plan:    Walker Kehr, MD

## 2020-09-07 NOTE — Assessment & Plan Note (Signed)
lipitor Rx

## 2020-09-09 ENCOUNTER — Other Ambulatory Visit: Payer: Self-pay

## 2020-09-09 ENCOUNTER — Ambulatory Visit
Admission: RE | Admit: 2020-09-09 | Discharge: 2020-09-09 | Disposition: A | Discharge status: Home / Self Care | Payer: Medicare Other | Source: Ambulatory Visit | Attending: Obstetrics and Gynecology | Admitting: Obstetrics and Gynecology

## 2020-09-09 DIAGNOSIS — Z1231 Encounter for screening mammogram for malignant neoplasm of breast: Secondary | ICD-10-CM | POA: Diagnosis not present

## 2020-09-10 ENCOUNTER — Other Ambulatory Visit: Payer: Self-pay | Admitting: Internal Medicine

## 2020-09-10 DIAGNOSIS — R944 Abnormal results of kidney function studies: Secondary | ICD-10-CM | POA: Insufficient documentation

## 2020-09-21 ENCOUNTER — Other Ambulatory Visit: Payer: Self-pay

## 2020-09-21 ENCOUNTER — Ambulatory Visit (INDEPENDENT_AMBULATORY_CARE_PROVIDER_SITE_OTHER): Payer: Medicare Other | Admitting: Obstetrics and Gynecology

## 2020-09-21 ENCOUNTER — Encounter: Payer: Self-pay | Admitting: Obstetrics and Gynecology

## 2020-09-21 VITALS — BP 138/84 | Ht 59.5 in | Wt 188.0 lb

## 2020-09-21 DIAGNOSIS — Z01419 Encounter for gynecological examination (general) (routine) without abnormal findings: Secondary | ICD-10-CM | POA: Diagnosis not present

## 2020-09-21 DIAGNOSIS — N8185 Cervical stump prolapse: Secondary | ICD-10-CM

## 2020-09-21 DIAGNOSIS — M8580 Other specified disorders of bone density and structure, unspecified site: Secondary | ICD-10-CM | POA: Diagnosis not present

## 2020-09-21 NOTE — Progress Notes (Signed)
Regina Roberts 1951-03-14 [id number]  SUBJECTIVE:  69 y.o. G25P1011 female here for a breast and pelvic exam. She has no gynecologic concerns.  Current Outpatient Medications  Medication Sig Dispense Refill  . atorvastatin (LIPITOR) 10 MG tablet TAKE 1 TABLET BY MOUTH EVERY DAY*ANNUAL APPT DUE IN APRIL 90 tablet 3  . b complex vitamins tablet Take 1 tablet by mouth daily. 100 tablet 3  . CALCIUM PO Take by mouth daily. OTC    . Cholecalciferol (VITAMIN D-3) 1000 UNITS CAPS Take 1 capsule by mouth daily.    . Cyanocobalamin ([employer]S VITAMIN B12 PO) Take 1 tablet by mouth daily.    . fexofenadine (ALLEGRA ALLERGY) 180 MG tablet Take 1 tablet (180 mg total) by mouth daily. 100 tablet 3  . hydrochlorothiazide (HYDRODIURIL) 12.5 MG tablet TAKE 1 TABLET(12.5 MG) BY MOUTH DAILY 90 tablet 3  . losartan (COZAAR) 100 MG tablet Take 1 tablet (100 mg total) by mouth daily. 90 tablet 3  . montelukast (SINGULAIR) 10 MG tablet TAKE 1 TABLET BY MOUTH EVERY DAY 90 tablet 3  . Multiple Vitamins-Minerals (CENTRUM ADULTS) TABS Take 1 tablet by mouth daily.    . naproxen sodium (ANAPROX) 220 MG tablet Take 220 mg by mouth as needed.    . Omega-3 Fatty Acids (FISH OIL PO) Take by mouth.    . pantoprazole (PROTONIX) 40 MG tablet TAKE 1 TABLET(40 MG) BY MOUTH DAILY 90 tablet 3  . triamcinolone ointment (KENALOG) 0.5 % apply to affected area twice a day 60 g 3   No current facility-administered medications for this visit.   Allergies: Lisinopril and Phentermine  No LMP recorded. Patient has had a hysterectomy.  Past medical history,surgical history, problem list, medications, allergies, family history and social history were all reviewed and documented as reviewed in the EPIC chart.  GYN ROS: no abnormal bleeding, pelvic pain or discharge, no breast pain or new or enlarging lumps on self exam.  No dysuria, frequency, burning, pain with urination, cloudy/malodorous urine.   OBJECTIVE:  BP 138/84   Ht  4' 11.5" (1.511 m)   Wt 188 lb (85.3 kg)   BMI 37.34 kg/m  The patient appears well, alert, oriented, in no distress.  BREAST EXAM: breasts appear normal, no suspicious masses, no skin or nipple changes or axillary nodes  PELVIC EXAM: VULVA: normal appearing vulva with atrophic changes, no masses, tenderness or lesions, VAGINA: Ring pessary removed, cleansed, replaced, no erosions or vaginal irritation, normal appearing vagina with atrophic changes, normal color and discharge, no lesions, CERVIX: With atrophic changes, UTERUS: surgically absent, ADNEXA: no masses, nontender  Chaperone: Thurnell Garbe present during the examination  ASSESSMENT:  69 y.o. G2P1011 here for a breast and pelvic exam  PLAN:   1. Cervical stump prolapse. Prior supracervical hysterectomy. Using ring pessary with good results. Has been able to remove and cleanse device herself without difficulty. Will continue with use. 2. Pap smear 09/2017. No significant history of abnormal Pap smears.  We discussed current screening guidelines and based on age criteria she is comfortable with not continuing screening. 3. Mammogram 08/2020.  Normal breast exam today.  Will continue with annual mammograms.   4. Colonoscopy 2018.  She will follow up at the interval recommended by her GI specialist. 5. Osteopenia. DEXA 10/2018. T score -1.8, FRAX 3.4% / 0.3%, stable from prior DEXA.  Next DEXA recommended 1-2 years. Encourage weightbearing exercise and continued calcium and vitamin D supplement. 6. Health maintenance.  No labs today as she  normally has these completed elsewhere. Managed by her  primary physician for hypertension, BP is mildly high today, recommend checking and reporting any persistent elevation.  Return annually or sooner, prn.  Joseph Pierini MD 09/21/20

## 2020-09-28 ENCOUNTER — Other Ambulatory Visit: Payer: Self-pay

## 2020-09-28 ENCOUNTER — Ambulatory Visit
Admission: RE | Admit: 2020-09-28 | Discharge: 2020-09-28 | Disposition: A | Discharge status: Home / Self Care | Payer: Medicare Other | Source: Ambulatory Visit | Attending: Internal Medicine | Admitting: Internal Medicine

## 2020-09-28 DIAGNOSIS — N281 Cyst of kidney, acquired: Secondary | ICD-10-CM | POA: Diagnosis not present

## 2020-09-28 DIAGNOSIS — R944 Abnormal results of kidney function studies: Secondary | ICD-10-CM

## 2021-02-07 ENCOUNTER — Other Ambulatory Visit: Payer: Self-pay | Admitting: Internal Medicine

## 2021-03-08 ENCOUNTER — Encounter: Payer: Self-pay | Admitting: Internal Medicine

## 2021-03-08 ENCOUNTER — Other Ambulatory Visit: Payer: Self-pay

## 2021-03-08 ENCOUNTER — Ambulatory Visit (INDEPENDENT_AMBULATORY_CARE_PROVIDER_SITE_OTHER): Payer: Medicare Other | Admitting: Internal Medicine

## 2021-03-08 DIAGNOSIS — E785 Hyperlipidemia, unspecified: Secondary | ICD-10-CM

## 2021-03-08 DIAGNOSIS — I10 Essential (primary) hypertension: Secondary | ICD-10-CM

## 2021-03-08 DIAGNOSIS — E669 Obesity, unspecified: Secondary | ICD-10-CM | POA: Diagnosis not present

## 2021-03-08 DIAGNOSIS — R944 Abnormal results of kidney function studies: Secondary | ICD-10-CM

## 2021-03-08 DIAGNOSIS — R739 Hyperglycemia, unspecified: Secondary | ICD-10-CM

## 2021-03-08 DIAGNOSIS — R350 Frequency of micturition: Secondary | ICD-10-CM | POA: Diagnosis not present

## 2021-03-08 LAB — COMPREHENSIVE METABOLIC PANEL WITH GFR
ALT: 12 U/L (ref 0–35)
AST: 12 U/L (ref 0–37)
Albumin: 4.2 g/dL (ref 3.5–5.2)
Alkaline Phosphatase: 61 U/L (ref 39–117)
BUN: 17 mg/dL (ref 6–23)
CO2: 28 meq/L (ref 19–32)
Calcium: 9.8 mg/dL (ref 8.4–10.5)
Chloride: 104 meq/L (ref 96–112)
Creatinine, Ser: 1.11 mg/dL (ref 0.40–1.20)
GFR: 50.89 mL/min — ABNORMAL LOW
Glucose, Bld: 117 mg/dL — ABNORMAL HIGH (ref 70–99)
Potassium: 4 meq/L (ref 3.5–5.1)
Sodium: 139 meq/L (ref 135–145)
Total Bilirubin: 0.4 mg/dL (ref 0.2–1.2)
Total Protein: 7.6 g/dL (ref 6.0–8.3)

## 2021-03-08 LAB — URINALYSIS, ROUTINE W REFLEX MICROSCOPIC
Bilirubin Urine: NEGATIVE
Ketones, ur: NEGATIVE
Nitrite: NEGATIVE
Specific Gravity, Urine: 1.025 (ref 1.000–1.030)
Total Protein, Urine: NEGATIVE
Urine Glucose: NEGATIVE
Urobilinogen, UA: 0.2 (ref 0.0–1.0)
pH: 5.5 (ref 5.0–8.0)

## 2021-03-08 LAB — LIPID PANEL
Cholesterol: 159 mg/dL (ref 0–200)
HDL: 38.4 mg/dL — ABNORMAL LOW (ref >39.00)
LDL Cholesterol: 94 mg/dL (ref 0–99)
NonHDL: 120.31
Total CHOL/HDL Ratio: 4
Triglycerides: 133 mg/dL (ref 0.0–149.0)
VLDL: 26.6 mg/dL (ref 0.0–40.0)

## 2021-03-08 LAB — HEMOGLOBIN A1C: Hgb A1c MFr Bld: 6.5 % (ref 4.6–6.5)

## 2021-03-08 NOTE — Assessment & Plan Note (Signed)
Check CMET. 

## 2021-03-08 NOTE — Assessment & Plan Note (Addendum)
On Lipitor 

## 2021-03-08 NOTE — Assessment & Plan Note (Signed)
Check UA, CMET

## 2021-03-08 NOTE — Progress Notes (Signed)
Subjective:  Patient ID: Regina Roberts, female    DOB: 03/09/1951  Age: 70 y.o. MRN: [id number]  CC: Follow-up (6 month f/u)   HPI [name] presents for HTN, GERD, obesity Taking Metamucil. C/o Metamucil C/o urinary frequency x 2 wks  Outpatient Medications Prior to Visit  Medication Sig Dispense Refill  . atorvastatin (LIPITOR) 10 MG tablet TAKE 1 TABLET BY MOUTH EVERY DAY*ANNUAL APPT DUE IN APRIL 90 tablet 3  . b complex vitamins tablet Take 1 tablet by mouth daily. 100 tablet 3  . CALCIUM PO Take by mouth daily. OTC    . Cholecalciferol (VITAMIN D-3) 1000 UNITS CAPS Take 1 capsule by mouth daily.    . Cyanocobalamin ([employer]S VITAMIN B12 PO) Take 1 tablet by mouth daily.    . fexofenadine (ALLEGRA ALLERGY) 180 MG tablet Take 1 tablet (180 mg total) by mouth daily. 100 tablet 3  . hydrochlorothiazide (HYDRODIURIL) 12.5 MG tablet TAKE 1 TABLET(12.5 MG) BY MOUTH DAILY 90 tablet 3  . losartan (COZAAR) 100 MG tablet Take 1 tablet (100 mg total) by mouth daily. 90 tablet 3  . montelukast (SINGULAIR) 10 MG tablet TAKE 1 TABLET BY MOUTH EVERY DAY 90 tablet 3  . Multiple Vitamins-Minerals (CENTRUM ADULTS) TABS Take 1 tablet by mouth daily.    . naproxen sodium (ANAPROX) 220 MG tablet Take 220 mg by mouth as needed.    . Omega-3 Fatty Acids (FISH OIL PO) Take by mouth.    . pantoprazole (PROTONIX) 40 MG tablet TAKE 1 TABLET(40 MG) BY MOUTH DAILY 30 tablet 5  . triamcinolone ointment (KENALOG) 0.5 % apply to affected area twice a day 60 g 3   No facility-administered medications prior to visit.    ROS: Review of Systems  Constitutional: Negative for activity change, appetite change, chills, fatigue and unexpected weight change.  HENT: Negative for congestion, mouth sores and sinus pressure.   Eyes: Negative for visual disturbance.  Respiratory: Negative for cough and chest tightness.   Gastrointestinal: Negative for abdominal pain and nausea.  Genitourinary: Positive  for frequency. Negative for difficulty urinating and vaginal pain.  Musculoskeletal: Negative for back pain and gait problem.  Skin: Negative for pallor and rash.  Neurological: Negative for dizziness, tremors, weakness, numbness and headaches.  Psychiatric/Behavioral: Negative for confusion and sleep disturbance.    Objective:  BP 130/82 (BP Location: Left Arm)   Pulse 88   Temp 99.1 F (37.3 C) (Oral)   Ht 4' 11.5" (1.511 m)   Wt 186 lb 13.9 oz (84.8 kg)   SpO2 95%   BMI 37.11 kg/m   BP Readings from Last 3 Encounters:  03/08/21 130/82  09/21/20 138/84  09/07/20 128/72    Wt Readings from Last 3 Encounters:  03/08/21 186 lb 13.9 oz (84.8 kg)  09/21/20 188 lb (85.3 kg)  09/07/20 190 lb 7.6 oz (86.4 kg)    Physical Exam Constitutional:      General: She is not in acute distress.    Appearance: She is well-developed. She is obese.  HENT:     Head: Normocephalic.     Right Ear: External ear normal.     Left Ear: External ear normal.     Nose: Nose normal.  Eyes:     General:        Right eye: No discharge.        Left eye: No discharge.     Conjunctiva/sclera: Conjunctivae normal.     Pupils: Pupils are equal, round,  and reactive to light.  Neck:     Thyroid: No thyromegaly.     Vascular: No JVD.     Trachea: No tracheal deviation.  Cardiovascular:     Rate and Rhythm: Normal rate and regular rhythm.     Heart sounds: Normal heart sounds.  Pulmonary:     Effort: No respiratory distress.     Breath sounds: No stridor. No wheezing.  Abdominal:     General: Bowel sounds are normal. There is no distension.     Palpations: Abdomen is soft. There is no mass.     Tenderness: There is no abdominal tenderness. There is no guarding or rebound.  Musculoskeletal:        General: No tenderness.     Cervical back: Normal range of motion and neck supple.  Lymphadenopathy:     Cervical: No cervical adenopathy.  Skin:    Findings: No erythema or rash.  Neurological:      Cranial Nerves: No cranial nerve deficit.     Motor: No abnormal muscle tone.     Coordination: Coordination normal.     Deep Tendon Reflexes: Reflexes normal.  Psychiatric:        Behavior: Behavior normal.        Thought Content: Thought content normal.        Judgment: Judgment normal.     Lab Results  Component Value Date   WBC 13.6 (H) 09/07/2020   HGB 12.5 09/07/2020   HCT 38.5 09/07/2020   PLT 378.0 09/07/2020   GLUCOSE 115 (H) 09/07/2020   CHOL 160 09/07/2020   TRIG 117.0 09/07/2020   HDL 41.00 09/07/2020   LDLCALC 96 09/07/2020   ALT 14 09/07/2020   AST 14 09/07/2020   NA 139 09/07/2020   K 4.1 09/07/2020   CL 101 09/07/2020   CREATININE 1.14 09/07/2020   BUN 17 09/07/2020   CO2 31 09/07/2020   TSH 1.05 09/07/2020   HGBA1C 6.5 09/07/2020    US Renal  Result Date: 09/28/2020 CLINICAL DATA:  Decreased GFR EXAM: RENAL / URINARY TRACT ULTRASOUND COMPLETE COMPARISON:  None. FINDINGS: Right Kidney: Renal measurements: 12.1 x 5.2 x 5.3 cm = volume: 174 mL. Renal cortical thinning. Mildly increased renal cortical echogenicity. 4.1 cm simple cyst within the mid to lower pole. No solid mass, shadowing stone, or hydronephrosis visualized. Left Kidney: Renal measurements: 11.4 x 4.8 x 5.0 cm = volume: 143 mL. Mildly increased renal echogenicity. 1.4 cm midpole cyst. No solid mass, shadowing stone or hydronephrosis visualized. Bladder: Appears normal for degree of bladder distention. Other: None. IMPRESSION: Bilaterally increased renal cortical echogenicity with renal cortical thinning more prevalent involving the right kidney. Findings suggest sequela of medical renal disease. No evidence of obstructive uropathy. Electronically Signed   By: Davina Poke D.O.   On: 09/28/2020 16:37    Assessment & Plan:   There are no diagnoses linked to this encounter.   No orders of the defined types were placed in this encounter.    Follow-up: No follow-ups on file.  Walker Kehr, MD

## 2021-03-08 NOTE — Assessment & Plan Note (Signed)
Better on diet 

## 2021-03-08 NOTE — Assessment & Plan Note (Signed)
Wt loss Check A1c

## 2021-03-08 NOTE — Assessment & Plan Note (Signed)
Losartan HCT 

## 2021-03-12 ENCOUNTER — Other Ambulatory Visit: Payer: Self-pay

## 2021-03-12 ENCOUNTER — Encounter (HOSPITAL_COMMUNITY): Payer: Self-pay

## 2021-03-12 ENCOUNTER — Inpatient Hospital Stay (HOSPITAL_COMMUNITY)
Admission: EM | Admit: 2021-03-12 | Discharge: 2021-03-30 | DRG: 329 | Disposition: A | Discharge status: Home Health | Payer: Medicare Other | Attending: General Surgery | Admitting: General Surgery

## 2021-03-12 DIAGNOSIS — K5792 Diverticulitis of intestine, part unspecified, without perforation or abscess without bleeding: Secondary | ICD-10-CM | POA: Diagnosis not present

## 2021-03-12 DIAGNOSIS — K567 Ileus, unspecified: Secondary | ICD-10-CM | POA: Diagnosis not present

## 2021-03-12 DIAGNOSIS — E43 Unspecified severe protein-calorie malnutrition: Secondary | ICD-10-CM | POA: Diagnosis not present

## 2021-03-12 DIAGNOSIS — I700 Atherosclerosis of aorta: Secondary | ICD-10-CM | POA: Diagnosis not present

## 2021-03-12 DIAGNOSIS — E669 Obesity, unspecified: Secondary | ICD-10-CM | POA: Diagnosis not present

## 2021-03-12 DIAGNOSIS — R198 Other specified symptoms and signs involving the digestive system and abdomen: Secondary | ICD-10-CM

## 2021-03-12 DIAGNOSIS — Z6837 Body mass index (BMI) 37.0-37.9, adult: Secondary | ICD-10-CM | POA: Diagnosis not present

## 2021-03-12 DIAGNOSIS — I10 Essential (primary) hypertension: Secondary | ICD-10-CM | POA: Diagnosis not present

## 2021-03-12 DIAGNOSIS — Z888 Allergy status to other drugs, medicaments and biological substances status: Secondary | ICD-10-CM | POA: Diagnosis not present

## 2021-03-12 DIAGNOSIS — Z823 Family history of stroke: Secondary | ICD-10-CM | POA: Diagnosis not present

## 2021-03-12 DIAGNOSIS — Z8249 Family history of ischemic heart disease and other diseases of the circulatory system: Secondary | ICD-10-CM

## 2021-03-12 DIAGNOSIS — R309 Painful micturition, unspecified: Secondary | ICD-10-CM | POA: Diagnosis not present

## 2021-03-12 DIAGNOSIS — K658 Other peritonitis: Secondary | ICD-10-CM | POA: Diagnosis not present

## 2021-03-12 DIAGNOSIS — K5730 Diverticulosis of large intestine without perforation or abscess without bleeding: Secondary | ICD-10-CM | POA: Diagnosis not present

## 2021-03-12 DIAGNOSIS — K388 Other specified diseases of appendix: Secondary | ICD-10-CM | POA: Diagnosis not present

## 2021-03-12 DIAGNOSIS — K219 Gastro-esophageal reflux disease without esophagitis: Secondary | ICD-10-CM | POA: Diagnosis present

## 2021-03-12 DIAGNOSIS — R350 Frequency of micturition: Secondary | ICD-10-CM | POA: Diagnosis not present

## 2021-03-12 DIAGNOSIS — K5720 Diverticulitis of large intestine with perforation and abscess without bleeding: Secondary | ICD-10-CM | POA: Diagnosis not present

## 2021-03-12 DIAGNOSIS — Z20822 Contact with and (suspected) exposure to covid-19: Secondary | ICD-10-CM | POA: Diagnosis not present

## 2021-03-12 DIAGNOSIS — K5732 Diverticulitis of large intestine without perforation or abscess without bleeding: Secondary | ICD-10-CM | POA: Diagnosis not present

## 2021-03-12 DIAGNOSIS — K660 Peritoneal adhesions (postprocedural) (postinfection): Secondary | ICD-10-CM | POA: Diagnosis present

## 2021-03-12 DIAGNOSIS — E785 Hyperlipidemia, unspecified: Secondary | ICD-10-CM | POA: Diagnosis present

## 2021-03-12 DIAGNOSIS — R600 Localized edema: Secondary | ICD-10-CM | POA: Diagnosis present

## 2021-03-12 DIAGNOSIS — K651 Peritoneal abscess: Secondary | ICD-10-CM | POA: Diagnosis not present

## 2021-03-12 DIAGNOSIS — N2889 Other specified disorders of kidney and ureter: Secondary | ICD-10-CM | POA: Diagnosis not present

## 2021-03-12 DIAGNOSIS — Z9889 Other specified postprocedural states: Secondary | ICD-10-CM | POA: Diagnosis not present

## 2021-03-12 DIAGNOSIS — R188 Other ascites: Secondary | ICD-10-CM | POA: Diagnosis not present

## 2021-03-12 DIAGNOSIS — Z90710 Acquired absence of both cervix and uterus: Secondary | ICD-10-CM | POA: Diagnosis not present

## 2021-03-12 DIAGNOSIS — Z803 Family history of malignant neoplasm of breast: Secondary | ICD-10-CM | POA: Diagnosis not present

## 2021-03-12 DIAGNOSIS — T8189XA Other complications of procedures, not elsewhere classified, initial encounter: Secondary | ICD-10-CM | POA: Diagnosis not present

## 2021-03-12 DIAGNOSIS — M7989 Other specified soft tissue disorders: Secondary | ICD-10-CM | POA: Diagnosis not present

## 2021-03-12 DIAGNOSIS — K5780 Diverticulitis of intestine, part unspecified, with perforation and abscess without bleeding: Secondary | ICD-10-CM | POA: Diagnosis not present

## 2021-03-12 DIAGNOSIS — N281 Cyst of kidney, acquired: Secondary | ICD-10-CM | POA: Diagnosis not present

## 2021-03-12 DIAGNOSIS — S31109A Unspecified open wound of abdominal wall, unspecified quadrant without penetration into peritoneal cavity, initial encounter: Secondary | ICD-10-CM | POA: Diagnosis not present

## 2021-03-12 DIAGNOSIS — Z933 Colostomy status: Secondary | ICD-10-CM

## 2021-03-12 LAB — CBC WITH DIFFERENTIAL/PLATELET
Abs Immature Granulocytes: 0.26 10*3/uL — ABNORMAL HIGH (ref 0.00–0.07)
Basophils Absolute: 0.1 10*3/uL (ref 0.0–0.1)
Basophils Relative: 0 %
Eosinophils Absolute: 0 10*3/uL (ref 0.0–0.5)
Eosinophils Relative: 0 %
HCT: 37.7 % (ref 36.0–46.0)
Hemoglobin: 12.5 g/dL (ref 12.0–15.0)
Immature Granulocytes: 1 %
Lymphocytes Relative: 6 %
Lymphs Abs: 2.1 10*3/uL (ref 0.7–4.0)
MCH: 28.7 pg (ref 26.0–34.0)
MCHC: 33.2 g/dL (ref 30.0–36.0)
MCV: 86.7 fL (ref 80.0–100.0)
Monocytes Absolute: 2 10*3/uL — ABNORMAL HIGH (ref 0.1–1.0)
Monocytes Relative: 6 %
Neutro Abs: 30 10*3/uL — ABNORMAL HIGH (ref 1.7–7.7)
Neutrophils Relative %: 87 %
Platelets: 403 10*3/uL — ABNORMAL HIGH (ref 150–400)
RBC: 4.35 MIL/uL (ref 3.87–5.11)
RDW: 15.1 % (ref 11.5–15.5)
WBC: 33.3 10*3/uL — ABNORMAL HIGH (ref 4.0–10.5)
nRBC: 0 % (ref 0.0–0.2)

## 2021-03-12 LAB — COMPREHENSIVE METABOLIC PANEL
ALT: 18 U/L (ref 0–44)
AST: 17 U/L (ref 15–41)
Albumin: 3.8 g/dL (ref 3.5–5.0)
Alkaline Phosphatase: 58 U/L (ref 38–126)
Anion gap: 11 (ref 5–15)
BUN: 18 mg/dL (ref 8–23)
CO2: 24 mmol/L (ref 22–32)
Calcium: 9.6 mg/dL (ref 8.9–10.3)
Chloride: 101 mmol/L (ref 98–111)
Creatinine, Ser: 1.2 mg/dL — ABNORMAL HIGH (ref 0.44–1.00)
GFR, Estimated: 49 mL/min — ABNORMAL LOW (ref >60)
Glucose, Bld: 123 mg/dL — ABNORMAL HIGH (ref 70–99)
Potassium: 3.1 mmol/L — ABNORMAL LOW (ref 3.5–5.1)
Sodium: 136 mmol/L (ref 135–145)
Total Bilirubin: 1.4 mg/dL — ABNORMAL HIGH (ref 0.3–1.2)
Total Protein: 7.5 g/dL (ref 6.5–8.1)

## 2021-03-12 LAB — LIPASE, BLOOD: Lipase: 21 U/L (ref 11–51)

## 2021-03-12 NOTE — ED Provider Notes (Signed)
Emergency Medicine Provider Triage Evaluation Note  Regina Roberts , a 70 y.o. female  was evaluated in triage.  Pt complains of cramping abdominal pain, located epigastric to suprapubic area, associated vomiting last night.  Denies changes in bowel or bladder habits, fevers, cough, chest pain, shortness of breath.  No known sick contacts..  Review of Systems  Positive: Abdominal pain, vomiting Negative: Changes in bowel or bladder habits, fever  Physical Exam  BP 128/60 (BP Location: Right Arm)   Pulse (!) 112   Temp 99.3 F (37.4 C) (Oral)   Resp 18   SpO2 98%  Gen:   Awake, no distress   Resp:  Normal effort  MSK:   Moves extremities without difficulty  Other:  Mild generalized abdominal tenderness on exam.  Medical Decision Making  Medically screening exam initiated at 7:54 PM.  Appropriate orders placed.  [name] was informed that the remainder of the evaluation will be completed by another provider, this initial triage assessment does not replace that evaluation, and the importance of remaining in the ED until their evaluation is complete.     Tacy Learn, PA-C 03/12/21 Marcine Matar, MD 03/12/21 [id number]

## 2021-03-12 NOTE — ED Triage Notes (Signed)
Patient reports generalized abdominal pain with nausea, hx of hysterectomy

## 2021-03-13 ENCOUNTER — Emergency Department (HOSPITAL_COMMUNITY): Payer: Medicare Other

## 2021-03-13 DIAGNOSIS — Z90710 Acquired absence of both cervix and uterus: Secondary | ICD-10-CM | POA: Diagnosis not present

## 2021-03-13 DIAGNOSIS — Z803 Family history of malignant neoplasm of breast: Secondary | ICD-10-CM | POA: Diagnosis not present

## 2021-03-13 DIAGNOSIS — Z823 Family history of stroke: Secondary | ICD-10-CM | POA: Diagnosis not present

## 2021-03-13 DIAGNOSIS — K660 Peritoneal adhesions (postprocedural) (postinfection): Secondary | ICD-10-CM | POA: Diagnosis present

## 2021-03-13 DIAGNOSIS — K5780 Diverticulitis of intestine, part unspecified, with perforation and abscess without bleeding: Secondary | ICD-10-CM | POA: Diagnosis not present

## 2021-03-13 DIAGNOSIS — Z20822 Contact with and (suspected) exposure to covid-19: Secondary | ICD-10-CM | POA: Diagnosis present

## 2021-03-13 DIAGNOSIS — K5720 Diverticulitis of large intestine with perforation and abscess without bleeding: Secondary | ICD-10-CM | POA: Diagnosis present

## 2021-03-13 DIAGNOSIS — I10 Essential (primary) hypertension: Secondary | ICD-10-CM | POA: Diagnosis present

## 2021-03-13 DIAGNOSIS — K5730 Diverticulosis of large intestine without perforation or abscess without bleeding: Secondary | ICD-10-CM | POA: Diagnosis not present

## 2021-03-13 DIAGNOSIS — N281 Cyst of kidney, acquired: Secondary | ICD-10-CM | POA: Diagnosis not present

## 2021-03-13 DIAGNOSIS — N2889 Other specified disorders of kidney and ureter: Secondary | ICD-10-CM | POA: Diagnosis not present

## 2021-03-13 DIAGNOSIS — E43 Unspecified severe protein-calorie malnutrition: Secondary | ICD-10-CM | POA: Diagnosis present

## 2021-03-13 DIAGNOSIS — M7989 Other specified soft tissue disorders: Secondary | ICD-10-CM | POA: Diagnosis not present

## 2021-03-13 DIAGNOSIS — K219 Gastro-esophageal reflux disease without esophagitis: Secondary | ICD-10-CM | POA: Diagnosis present

## 2021-03-13 DIAGNOSIS — E785 Hyperlipidemia, unspecified: Secondary | ICD-10-CM | POA: Diagnosis present

## 2021-03-13 DIAGNOSIS — Z8249 Family history of ischemic heart disease and other diseases of the circulatory system: Secondary | ICD-10-CM | POA: Diagnosis not present

## 2021-03-13 DIAGNOSIS — K567 Ileus, unspecified: Secondary | ICD-10-CM | POA: Diagnosis not present

## 2021-03-13 DIAGNOSIS — Z6837 Body mass index (BMI) 37.0-37.9, adult: Secondary | ICD-10-CM | POA: Diagnosis not present

## 2021-03-13 DIAGNOSIS — E669 Obesity, unspecified: Secondary | ICD-10-CM | POA: Diagnosis present

## 2021-03-13 DIAGNOSIS — R600 Localized edema: Secondary | ICD-10-CM | POA: Diagnosis present

## 2021-03-13 DIAGNOSIS — I700 Atherosclerosis of aorta: Secondary | ICD-10-CM | POA: Diagnosis not present

## 2021-03-13 DIAGNOSIS — K5792 Diverticulitis of intestine, part unspecified, without perforation or abscess without bleeding: Secondary | ICD-10-CM | POA: Diagnosis present

## 2021-03-13 DIAGNOSIS — R309 Painful micturition, unspecified: Secondary | ICD-10-CM | POA: Diagnosis present

## 2021-03-13 DIAGNOSIS — R188 Other ascites: Secondary | ICD-10-CM | POA: Diagnosis not present

## 2021-03-13 DIAGNOSIS — Z888 Allergy status to other drugs, medicaments and biological substances status: Secondary | ICD-10-CM | POA: Diagnosis not present

## 2021-03-13 LAB — URINALYSIS, ROUTINE W REFLEX MICROSCOPIC
Bilirubin Urine: NEGATIVE
Glucose, UA: NEGATIVE mg/dL
Ketones, ur: NEGATIVE mg/dL
Nitrite: NEGATIVE
Protein, ur: NEGATIVE mg/dL
Specific Gravity, Urine: 1.025 (ref 1.005–1.030)
pH: 5 (ref 5.0–8.0)

## 2021-03-13 LAB — SARS CORONAVIRUS 2 BY RT PCR (HOSPITAL ORDER, PERFORMED IN ~~LOC~~ HOSPITAL LAB): SARS Coronavirus 2: NEGATIVE

## 2021-03-13 LAB — HIV ANTIBODY (ROUTINE TESTING W REFLEX): HIV Screen 4th Generation wRfx: NONREACTIVE

## 2021-03-13 MED ORDER — MONTELUKAST SODIUM 10 MG PO TABS
10.0000 mg | ORAL_TABLET | Freq: Every day | ORAL | Status: DC
  Administered 2021-03-13 – 2021-03-28 (×15): 10 mg via ORAL
  Filled 2021-03-13 (×16): qty 1

## 2021-03-13 MED ORDER — SODIUM CHLORIDE 0.9 % IV BOLUS
1000.0000 mL | Freq: Once | INTRAVENOUS | Status: AC
  Administered 2021-03-13: 1000 mL via INTRAVENOUS

## 2021-03-13 MED ORDER — DIPHENHYDRAMINE HCL 50 MG/ML IJ SOLN
25.0000 mg | Freq: Once | INTRAMUSCULAR | Status: DC
  Filled 2021-03-13: qty 1

## 2021-03-13 MED ORDER — DEXTROSE-NACL 5-0.45 % IV SOLN: INTRAVENOUS | Status: DC

## 2021-03-13 MED ORDER — ONDANSETRON HCL 4 MG/2ML IJ SOLN
4.0000 mg | Freq: Four times a day (QID) | INTRAMUSCULAR | Status: DC | PRN
  Administered 2021-03-13 – 2021-03-28 (×2): 4 mg via INTRAVENOUS
  Filled 2021-03-13 (×3): qty 2

## 2021-03-13 MED ORDER — ONDANSETRON 4 MG PO TBDP: 4.0000 mg | ORAL_TABLET | Freq: Four times a day (QID) | ORAL | Status: DC | PRN

## 2021-03-13 MED ORDER — MORPHINE SULFATE (PF) 4 MG/ML IV SOLN
4.0000 mg | Freq: Once | INTRAVENOUS | Status: AC
  Administered 2021-03-13: 4 mg via INTRAVENOUS
  Filled 2021-03-13: qty 1

## 2021-03-13 MED ORDER — ONDANSETRON HCL 4 MG/2ML IJ SOLN
4.0000 mg | Freq: Once | INTRAMUSCULAR | Status: AC
  Administered 2021-03-13: 4 mg via INTRAVENOUS
  Filled 2021-03-13: qty 2

## 2021-03-13 MED ORDER — ATORVASTATIN CALCIUM 10 MG PO TABS
10.0000 mg | ORAL_TABLET | Freq: Every day | ORAL | Status: DC
  Administered 2021-03-13 – 2021-03-28 (×15): 10 mg via ORAL
  Filled 2021-03-13 (×15): qty 1

## 2021-03-13 MED ORDER — CALCIUM CARBONATE ANTACID 500 MG PO CHEW
1.0000 | CHEWABLE_TABLET | Freq: Three times a day (TID) | ORAL | Status: DC | PRN
  Administered 2021-03-13: 200 mg via ORAL
  Filled 2021-03-13: qty 1

## 2021-03-13 MED ORDER — PANTOPRAZOLE SODIUM 40 MG PO TBEC
40.0000 mg | DELAYED_RELEASE_TABLET | Freq: Every day | ORAL | Status: DC
  Administered 2021-03-13 – 2021-03-30 (×17): 40 mg via ORAL
  Filled 2021-03-13 (×17): qty 1

## 2021-03-13 MED ORDER — PIPERACILLIN-TAZOBACTAM 3.375 G IVPB
3.3750 g | Freq: Once | INTRAVENOUS | Status: AC
  Administered 2021-03-13: 3.375 g via INTRAVENOUS
  Filled 2021-03-13: qty 50

## 2021-03-13 MED ORDER — DIPHENHYDRAMINE HCL 50 MG/ML IJ SOLN: 12.5000 mg | Freq: Four times a day (QID) | INTRAMUSCULAR | Status: DC | PRN

## 2021-03-13 MED ORDER — ENOXAPARIN SODIUM 40 MG/0.4ML IJ SOSY
40.0000 mg | PREFILLED_SYRINGE | INTRAMUSCULAR | Status: DC
  Administered 2021-03-13 – 2021-03-30 (×16): 40 mg via SUBCUTANEOUS
  Filled 2021-03-13 (×17): qty 0.4

## 2021-03-13 MED ORDER — HYDROMORPHONE HCL 1 MG/ML IJ SOLN
0.5000 mg | INTRAMUSCULAR | Status: DC | PRN
  Administered 2021-03-13 – 2021-03-14 (×3): 1 mg via INTRAVENOUS
  Administered 2021-03-14: 0.5 mg via INTRAVENOUS
  Administered 2021-03-14 – 2021-03-20 (×25): 1 mg via INTRAVENOUS
  Administered 2021-03-20: 0.5 mg via INTRAVENOUS
  Filled 2021-03-13 (×31): qty 1

## 2021-03-13 MED ORDER — DIPHENHYDRAMINE HCL 12.5 MG/5ML PO ELIX: 12.5000 mg | ORAL_SOLUTION | Freq: Four times a day (QID) | ORAL | Status: DC | PRN

## 2021-03-13 MED ORDER — PIPERACILLIN-TAZOBACTAM 3.375 G IVPB
3.3750 g | Freq: Three times a day (TID) | INTRAVENOUS | Status: DC
  Administered 2021-03-13 – 2021-03-18 (×15): 3.375 g via INTRAVENOUS
  Filled 2021-03-13 (×15): qty 50

## 2021-03-13 NOTE — ED Provider Notes (Signed)
[facility] EMERGENCY DEPARTMENT Provider Note   CSN: [id number] Arrival date & time: 03/12/21  1758     History Chief Complaint  Patient presents with  . Abdominal Pain    Regina Roberts is a 70 y.o. female.  Patient is a 70 year old female with past medical history of GERD, hypertension, hyperlipidemia.  She presents today for evaluation of abdominal pain.  This started yesterday and is worsening.  She describes pain across the upper half and lower half of her abdomen that is constant, but does wax and wane.  She denies any bloody stool or vomit.  She denies any fevers or chills.  The history is provided by the patient.  Abdominal Pain Pain location:  Generalized Pain quality: cramping   Pain radiates to:  Does not radiate Pain severity:  Moderate Onset quality:  Gradual Duration:  1 day Timing:  Constant Progression:  Worsening      Past Medical History:  Diagnosis Date  . Allergy   . Anemia   . Blood transfusion without reported diagnosis   . GERD (gastroesophageal reflux disease)   . Hyperlipidemia   . Hypertension   . Osteopenia 10/2018   T score -1.8 FRAX 3.4% / 0.3% able from prior DEXA    Patient Active Problem List   Diagnosis Date Noted  . Frequency of urination 03/08/2021  . Decreased GFR 09/10/2020  . Neoplasm of uncertain behavior of skin 09/07/2020  . Well adult exam 09/07/2020  . Hyperglycemia 09/07/2020  . GERD (gastroesophageal reflux disease) 02/20/2019  . Tachycardia 05/21/2018  . Cough 11/01/2017  . Allergic rhinitis 11/01/2017  . Ulnar nerve neuropathy 05/16/2017  . Raynaud phenomenon 02/01/2017  . Obesity, Class II, BMI 35-39.9 06/09/2015  . Dizziness 03/09/2015  . Swelling of both hands 09/08/2014  . SHOULDER PAIN 05/18/2010  . Edema 05/18/2010  . GRANULOMA, INFECTIOUS 05/15/2009  . Dyslipidemia 07/16/2008  . MENOPAUSAL SYNDROME 07/16/2008  . LOW BACK PAIN 07/16/2008  . Essential hypertension  07/16/2008    Past Surgical History:  Procedure Laterality Date  . HERNIA REPAIR    . PELVIC LAPAROSCOPY  1996  . SUPRACERVICAL ABDOMINAL HYSTERECTOMY  2004   with RSO. Leiomyoma menorrhagia with 16-18 week size uterus     OB History    Gravida  2   Para  1   Term      Preterm      AB  1   Living  1     SAB      IAB      Ectopic      Multiple      Live Births              Family History  Problem Relation Age of Onset  . Heart disease Mother 13  . Cancer Mother 30       neck ca   . Stroke Father 47  . Other Brother        train accident  . Other Brother        respiratory  . Other Brother        liver failure ?  Marland Kitchen Other Daughter        suicide  . Breast cancer Maternal Grandmother        untreated  . Colon cancer Neg Hx   . Esophageal cancer Neg Hx   . Pancreatic cancer Neg Hx   . Rectal cancer Neg Hx   . Stomach cancer Neg Hx  Social History   Tobacco Use  . Smoking status: Never Smoker  . Smokeless tobacco: Never Used  Vaping Use  . Vaping Use: Never used  Substance Use Topics  . Alcohol use: Yes    Comment: rarely   . Drug use: No    Home Medications Prior to Admission medications   Medication Sig Start Date End Date Taking? Authorizing Provider  atorvastatin (LIPITOR) 10 MG tablet TAKE 1 TABLET BY MOUTH EVERY DAY*ANNUAL APPT DUE IN [facility] 02/24/20   Plotnikov, Evie Lacks, MD  b complex vitamins tablet Take 1 tablet by mouth daily. 05/16/17   Plotnikov, Evie Lacks, MD  CALCIUM PO Take by mouth daily. OTC    [provider]  Cholecalciferol (VITAMIN D-3) 1000 UNITS CAPS Take 1 capsule by mouth daily.    [provider]  Cyanocobalamin ([employer] VITAMIN B12 PO) Take 1 tablet by mouth daily.    [provider]  fexofenadine (ALLEGRA ALLERGY) 180 MG tablet Take 1 tablet (180 mg total) by mouth daily. 11/01/17   Plotnikov, Evie Lacks, MD  hydrochlorothiazide (HYDRODIURIL) 12.5 MG tablet TAKE 1 TABLET(12.5 MG) BY MOUTH  DAILY 02/19/20   Plotnikov, Evie Lacks, MD  losartan (COZAAR) 100 MG tablet Take 1 tablet (100 mg total) by mouth daily. 02/19/20   Plotnikov, Evie Lacks, MD  montelukast (SINGULAIR) 10 MG tablet TAKE 1 TABLET BY MOUTH EVERY DAY 04/13/20   Plotnikov, Evie Lacks, MD  Multiple Vitamins-Minerals (CENTRUM ADULTS) TABS Take 1 tablet by mouth daily.    [provider]  naproxen sodium (ANAPROX) 220 MG tablet Take 220 mg by mouth as needed.    [provider]  Omega-3 Fatty Acids (FISH OIL PO) Take by mouth.    [provider]  pantoprazole (PROTONIX) 40 MG tablet TAKE 1 TABLET(40 MG) BY MOUTH DAILY 02/08/21   Plotnikov, Evie Lacks, MD  triamcinolone ointment (KENALOG) 0.5 % apply to affected area twice a day 02/19/20   Plotnikov, Evie Lacks, MD    Allergies    Lisinopril and Phentermine  Review of Systems   Review of Systems  Gastrointestinal: Positive for abdominal pain.  All other systems reviewed and are negative.   Physical Exam Updated Vital Signs BP (!) 141/76   Pulse (!) 116   Temp 99.6 F (37.6 C) (Oral)   Resp 17   SpO2 98%   Physical Exam Vitals and nursing note reviewed.  Constitutional:      General: She is not in acute distress.    Appearance: She is well-developed. She is not diaphoretic.  HENT:     Head: Normocephalic and atraumatic.  Cardiovascular:     Rate and Rhythm: Normal rate and regular rhythm.     Heart sounds: No murmur heard. No friction rub. No gallop.   Pulmonary:     Effort: Pulmonary effort is normal. No respiratory distress.     Breath sounds: Normal breath sounds. No wheezing.  Abdominal:     General: Bowel sounds are normal. There is no distension.     Palpations: Abdomen is soft.     Tenderness: There is abdominal tenderness in the epigastric area and suprapubic area. There is no right CVA tenderness, left CVA tenderness, guarding or rebound.  Musculoskeletal:        General: Normal range of motion.     Cervical back: Normal  range of motion and neck supple.  Skin:    General: Skin is warm and dry.  Neurological:     Mental Status:  She is alert and oriented to person, place, and time.     ED Results / Procedures / Treatments   Labs (all labs ordered are listed, but only abnormal results are displayed) Labs Reviewed  CBC WITH DIFFERENTIAL/PLATELET - Abnormal; Notable for the following components:      Result Value   WBC 33.3 (*)    Platelets 403 (*)    Neutro Abs 30.0 (*)    Monocytes Absolute 2.0 (*)    Abs Immature Granulocytes 0.26 (*)    All other components within normal limits  COMPREHENSIVE METABOLIC PANEL - Abnormal; Notable for the following components:   Potassium 3.1 (*)    Glucose, Bld 123 (*)    Creatinine, Ser 1.20 (*)    Total Bilirubin 1.4 (*)    GFR, Estimated 49 (*)    All other components within normal limits  LIPASE, BLOOD  URINALYSIS, ROUTINE W REFLEX MICROSCOPIC    EKG None  Radiology No results found.  Procedures Procedures   Medications Ordered in ED Medications  sodium chloride 0.9 % bolus 1,000 mL (has no administration in time range)  ondansetron (ZOFRAN) injection 4 mg (has no administration in time range)  morphine 4 MG/ML injection 4 mg (has no administration in time range)    ED Course  I have reviewed the triage vital signs and the nursing notes.  Pertinent labs & imaging results that were available during my care of the patient were reviewed by me and considered in my medical decision making (see chart for details).    MDM Rules/Calculators/A&P  Patient presenting here with abdominal pain which is worsened over the past 24 hours.  Patient's white count is 33,000 and CT scan shows acute diverticulitis with perforation and free air.  Patient given intravenous Zosyn and will be admitted to the general surgery service.  I have spoken with Dr. Barry Dienes.  CRITICAL CARE Performed by: Veryl Speak Total critical care time: 35 minutes Critical care time was  exclusive of separately billable procedures and treating other patients. Critical care was necessary to treat or prevent imminent or life-threatening deterioration. Critical care was time spent personally by me on the following activities: development of treatment plan with patient and/or surrogate as well as nursing, discussions with consultants, evaluation of patient's response to treatment, examination of patient, obtaining history from patient or surrogate, ordering and performing treatments and interventions, ordering and review of laboratory studies, ordering and review of radiographic studies, pulse oximetry and re-evaluation of patient's condition.   Final Clinical Impression(s) / ED Diagnoses Final diagnoses:  None    Rx / DC Orders ED Discharge Orders    None       Veryl Speak, MD 03/13/21 [phone]

## 2021-03-13 NOTE — H&P (Signed)
[place] [birth year]  [id number].    Requesting MD: Dr. Stark Jock Chief Complaint/Reason for Consult: diverticulitis  HPI:  Regina Roberts is a 70 yo female who presented to the ED with abdominal pain. It started several days ago as an aching pain, but yesterday became acutely worse and constant. She says she now has pain all over her abdomen. She last had a bowel movement yesterday, which was normal. No diarrhea. She has been tachycardic in the 110s but normotensive in the ED. Labs were significant for a WBC of 33. CT abd/pelvis showed sigmoid diverticulitis with perforation, but no abscess. General surgery was consulted.  The patient was given a dose of Zosyn in the ED.  Patient's last colonoscopy was in 2018, at which time she had diverticulosis and a benign polyp.  ROS: Review of Systems  Constitutional: Negative for chills and fever.  Respiratory: Negative for shortness of breath, wheezing and stridor.   Cardiovascular: Negative for chest pain.  Gastrointestinal: Positive for abdominal pain, nausea and vomiting. Negative for diarrhea.  Neurological: Negative for seizures and weakness.    Family History  Problem Relation Age of Onset  . Heart disease Mother 43  . Cancer Mother 55       neck ca   . Stroke Father 70  . Other Brother        train accident  . Other Brother        respiratory  . Other Brother        liver failure ?  Marland Kitchen Other Daughter        suicide  . Breast cancer Maternal Grandmother        untreated  . Colon cancer Neg Hx   . Esophageal cancer Neg Hx   . Pancreatic cancer Neg Hx   . Rectal cancer Neg Hx   . Stomach cancer Neg Hx     Past Medical History:  Diagnosis Date  . Allergy   . Anemia   . Blood transfusion without reported diagnosis   . GERD (gastroesophageal reflux disease)   . Hyperlipidemia   . Hypertension   . Osteopenia 10/2018   T score -1.8 FRAX 3.4% / 0.3% able from prior DEXA    Past Surgical History:  Procedure  Laterality Date  . HERNIA REPAIR    . PELVIC LAPAROSCOPY  1996  . SUPRACERVICAL ABDOMINAL HYSTERECTOMY  2004   with RSO. Leiomyoma menorrhagia with 16-18 week size uterus    Social History:  reports that she has never smoked. She has never used smokeless tobacco. She reports current alcohol use. She reports that she does not use drugs.  Allergies:  Allergies  Allergen Reactions  . Lisinopril Other (See Comments)    cough  . Phentermine Other (See Comments)    Raynauld's ? Tachycardia    (Not in a hospital admission)    Physical Exam: Blood pressure 104/79, pulse (!) 116, temperature 99 F (37.2 C), temperature source Oral, resp. rate 15, SpO2 97 %. General: resting comfortably, appears stated age, no apparent distress Neurological: alert and oriented, no focal deficits, cranial nerves grossly in tact HEENT: normocephalic, atraumatic, oropharynx clear, no scleral icterus CV: tachycardic 110s, regular rhythm,  extremities warm and well-perfused Respiratory: normal work of breathing on room air, symmetric chest wall expansion Abdomen: soft, nondistended, diffuse tenderness to palpation but no guarding or rebound tenderness. No masses or organomegaly. Extremities: warm and well-perfused, no deformities, moving all extremities spontaneously Psychiatric: normal mood and affect Skin: warm  and dry, no jaundice, no rashes or lesions   Results for orders placed or performed during the hospital encounter of 03/12/21 (from the past 48 hour(s))  CBC with Differential     Status: Abnormal   Collection Time: 03/12/21  7:59 PM  Result Value Ref Range   WBC 33.3 (H) 4.0 - 10.5 K/uL   RBC 4.35 3.87 - 5.11 MIL/uL   Hemoglobin 12.5 12.0 - 15.0 g/dL   HCT 37.7 36.0 - 46.0 %   MCV 86.7 80.0 - 100.0 fL   MCH 28.7 26.0 - 34.0 pg   MCHC 33.2 30.0 - 36.0 g/dL   RDW 15.1 11.5 - 15.5 %   Platelets 403 (H) 150 - 400 K/uL   nRBC 0.0 0.0 - 0.2 %   Neutrophils Relative % 87 %   Neutro Abs 30.0  (H) 1.7 - 7.7 K/uL   Lymphocytes Relative 6 %   Lymphs Abs 2.1 0.7 - 4.0 K/uL   Monocytes Relative 6 %   Monocytes Absolute 2.0 (H) 0.1 - 1.0 K/uL   Eosinophils Relative 0 %   Eosinophils Absolute 0.0 0.0 - 0.5 K/uL   Basophils Relative 0 %   Basophils Absolute 0.1 0.0 - 0.1 K/uL   Immature Granulocytes 1 %   Abs Immature Granulocytes 0.26 (H) 0.00 - 0.07 K/uL    Comment: Performed at [facility], [address], [place], [place] [address]  Comprehensive metabolic panel     Status: Abnormal   Collection Time: 03/12/21  7:59 PM  Result Value Ref Range   Sodium 136 135 - 145 mmol/L   Potassium 3.1 (L) 3.5 - 5.1 mmol/L   Chloride 101 98 - 111 mmol/L   CO2 24 22 - 32 mmol/L   Glucose, Bld 123 (H) 70 - 99 mg/dL    Comment: Glucose reference range applies only to samples taken after fasting for at least 8 hours.   BUN 18 8 - 23 mg/dL   Creatinine, Ser 1.20 (H) 0.44 - 1.00 mg/dL   Calcium 9.6 8.9 - 10.3 mg/dL   Total Protein 7.5 6.5 - 8.1 g/dL   Albumin 3.8 3.5 - 5.0 g/dL   AST 17 15 - 41 U/L   ALT 18 0 - 44 U/L   Alkaline Phosphatase 58 38 - 126 U/L   Total Bilirubin 1.4 (H) 0.3 - 1.2 mg/dL   GFR, Estimated 49 (L) >60 mL/min    Comment: (NOTE) Calculated using the CKD-EPI Creatinine Equation (2021)    Anion gap 11 5 - 15    Comment: Performed at [facility] [address] [place], [place] [address]  Lipase, blood     Status: None   Collection Time: 03/12/21  7:59 PM  Result Value Ref Range   Lipase 21 11 - 51 U/L    Comment: Performed at [facility] [address], [place], [place] [address]  Urinalysis, Routine w reflex microscopic Urine, Clean Catch     Status: Abnormal   Collection Time: 03/13/21  6:15 AM  Result Value Ref Range   Color, Urine AMBER (A) YELLOW    Comment: BIOCHEMICALS MAY BE AFFECTED BY COLOR   APPearance HAZY (A) CLEAR   Specific Gravity, Urine 1.025 1.005 - 1.030   pH 5.0 5.0 - 8.0   Glucose, UA NEGATIVE NEGATIVE mg/dL   Hgb  urine dipstick SMALL (A) NEGATIVE   Bilirubin Urine NEGATIVE NEGATIVE   Ketones, ur NEGATIVE NEGATIVE mg/dL   Protein,  ur NEGATIVE NEGATIVE mg/dL   Nitrite NEGATIVE NEGATIVE   Leukocytes,Ua TRACE (A) NEGATIVE   RBC / HPF 11-20 0 - 5 RBC/hpf   WBC, UA 6-10 0 - 5 WBC/hpf   Bacteria, UA RARE (A) NONE SEEN   Squamous Epithelial / LPF 11-20 0 - 5   Mucus PRESENT     Comment: Performed at [facility], [place] [address], [place], [place] [address]   CT ABDOMEN PELVIS WO CONTRAST  Result Date: 03/13/2021 CLINICAL DATA:  Abdominal abscess/infection suspected. Abdominal pain. EXAM: CT ABDOMEN AND PELVIS WITHOUT CONTRAST TECHNIQUE: Multidetector CT imaging of the abdomen and pelvis was performed following the standard protocol without IV contrast. COMPARISON:  Ultrasound renal 09/28/2020 FINDINGS: Lower chest: No acute abnormality. Hepatobiliary: No focal liver abnormality. No gallstones, gallbladder wall thickening, or pericholecystic fluid. No biliary dilatation. Pancreas: No focal lesion. Normal pancreatic contour. No surrounding inflammatory changes. No main pancreatic ductal dilatation. Spleen: Normal in size without focal abnormality. Adrenals/Urinary Tract: No adrenal nodule bilaterally. No nephrolithiasis, no hydronephrosis. There is a 4.7 cm fluid density lesion within the right kidney with associated punctate calcification. Question tiny left parapelvic cyst. No other contour-deforming renal mass. No ureterolithiasis or hydroureter. The urinary bladder is unremarkable. Stomach/Bowel: Stomach is within normal limits. No evidence of small bowel wall thickening or dilatation. Scattered colonic diverticulosis with mid to distal sigmoid colon mild bowel wall thickening and associated pericolonic fat stranding. Gas is noted to extravasated from the lumen of the sigmoid colon (6:86, 3:60). The appendix is not definitely identified and may be surgically absent. Vascular/Lymphatic: No abdominal aorta or  iliac aneurysm. Mild atherosclerotic plaque of the aorta and its branches. No abdominal, pelvic, or inguinal lymphadenopathy. Reproductive: Status post hysterectomy. No adnexal masses. Other: Trace free fluid within the pelvis. Scattered small to moderate volume pneumoperitoneum throughout the abdomen and pelvis. No organized fluid collection. Musculoskeletal: No abdominal wall hernia or abnormality. No suspicious lytic or blastic osseous lesions. No acute displaced fracture. T12 vertebral body hemangioma. Grade 1 anterolisthesis of L4 on L5. IMPRESSION: 1. Scattered colonic diverticulosis complicated by a perforated acute sigmoid diverticulitis. Associated small to moderate volume pneumoperitoneum. No large volume ascites. No abscess formation. 2. The appendix not definitely identified. 3. A 4.7 cm fluid density lesion within the right kidney with associated punctate calcification. Finding could represent a minimally complex renal cyst, likely parapelvic. These results were called by telephone at the time of interpretation on 03/13/2021 at 6:04 am to provider Veryl Speak , who verbally acknowledged these results. Electronically Signed   By: Iven Finn M.D.   On: 03/13/2021 06:12      Assessment/Plan 70 yo female presenting with acute perforated sigmoid diverticulitis. I reviewed her CT scan. There is inflammation around the sigmoid colon, with foci of free air throughout the abdomen but no free fluid or abscess. The patient is tender on exam but is not peritonitic. She is currently hemodynamically stable. I discussed admission with IV antibiotics vs proceeding to the OR for a Hartmann procedure. Since she is stable without peritonitis, I think it is reasonable to trial nonoperative management with IV antibiotics, however if she clinically worsens at all she will require urgent sigmoid colectomy with end colostomy. I discussed this with the patient. - NPO, IV fluid hydration - Continue Zosyn - Recheck  labs in am - Serial abdominal exams - VTE: lovenox, SCDs - Admit to inpatient  Michaelle Birks, MD [facility] Surgery General, Hepatobiliary and Pancreatic Surgery 03/13/21 10:42 AM

## 2021-03-13 NOTE — ED Notes (Signed)
Pt ambulated to BR with steady gait, no assistance needed  

## 2021-03-14 LAB — BASIC METABOLIC PANEL
Anion gap: 9 (ref 5–15)
BUN: 16 mg/dL (ref 8–23)
CO2: 28 mmol/L (ref 22–32)
Calcium: 8.8 mg/dL — ABNORMAL LOW (ref 8.9–10.3)
Chloride: 102 mmol/L (ref 98–111)
Creatinine, Ser: 1.29 mg/dL — ABNORMAL HIGH (ref 0.44–1.00)
GFR, Estimated: 45 mL/min — ABNORMAL LOW (ref >60)
Glucose, Bld: 137 mg/dL — ABNORMAL HIGH (ref 70–99)
Potassium: 3.6 mmol/L (ref 3.5–5.1)
Sodium: 139 mmol/L (ref 135–145)

## 2021-03-14 LAB — CBC
HCT: 33.2 % — ABNORMAL LOW (ref 36.0–46.0)
Hemoglobin: 10.9 g/dL — ABNORMAL LOW (ref 12.0–15.0)
MCH: 28.3 pg (ref 26.0–34.0)
MCHC: 32.8 g/dL (ref 30.0–36.0)
MCV: 86.2 fL (ref 80.0–100.0)
Platelets: 336 10*3/uL (ref 150–400)
RBC: 3.85 MIL/uL — ABNORMAL LOW (ref 3.87–5.11)
RDW: 15.5 % (ref 11.5–15.5)
WBC: 34 10*3/uL — ABNORMAL HIGH (ref 4.0–10.5)
nRBC: 0 % (ref 0.0–0.2)

## 2021-03-14 MED ORDER — SODIUM CHLORIDE 0.9 % IV BOLUS
1000.0000 mL | Freq: Once | INTRAVENOUS | Status: AC
  Administered 2021-03-14: 1000 mL via INTRAVENOUS

## 2021-03-14 NOTE — Progress Notes (Signed)
Diverticulitis  Subjective: Pt feels much better, min pain at this time, UOP not recorded, HR better  Objective: Vital signs in last 24 hours: Temp:  [98 F (36.7 C)-99.5 F (37.5 C)] 98.6 F (37 C) (05/22 0636) Pulse Rate:  [95-116] 95 (05/22 0636) Resp:  [15-18] 18 (05/22 0636) BP: (104-126)/(63-79) 105/68 (05/22 0636) SpO2:  [95 %-97 %] 95 % (05/22 0636) Last BM Date: 03/12/21  Intake/Output from previous day: 05/21 0701 - 05/22 0700 In: 1600 [I.V.:1500; IV Piggyback:100] Out: -  Intake/Output this shift: No intake/output data recorded.  General appearance: alert and cooperative GI: soft, distended, mild TTP mostly in LLQ  Lab Results:  Results for orders placed or performed during the hospital encounter of 03/12/21 (from the past 24 hour(s))  Basic metabolic panel     Status: Abnormal   Collection Time: 03/14/21  2:10 AM  Result Value Ref Range   Sodium 139 135 - 145 mmol/L   Potassium 3.6 3.5 - 5.1 mmol/L   Chloride 102 98 - 111 mmol/L   CO2 28 22 - 32 mmol/L   Glucose, Bld 137 (H) 70 - 99 mg/dL   BUN 16 8 - 23 mg/dL   Creatinine, Ser 1.29 (H) 0.44 - 1.00 mg/dL   Calcium 8.8 (L) 8.9 - 10.3 mg/dL   GFR, Estimated 45 (L) >60 mL/min   Anion gap 9 5 - 15  CBC     Status: Abnormal   Collection Time: 03/14/21  2:10 AM  Result Value Ref Range   WBC 34.0 (H) 4.0 - 10.5 K/uL   RBC 3.85 (L) 3.87 - 5.11 MIL/uL   Hemoglobin 10.9 (L) 12.0 - 15.0 g/dL   HCT 33.2 (L) 36.0 - 46.0 %   MCV 86.2 80.0 - 100.0 fL   MCH 28.3 26.0 - 34.0 pg   MCHC 32.8 30.0 - 36.0 g/dL   RDW 15.5 11.5 - 15.5 %   Platelets 336 150 - 400 K/uL   nRBC 0.0 0.0 - 0.2 %     Studies/Results Radiology     MEDS, Scheduled . atorvastatin  10 mg Oral Daily  . diphenhydrAMINE  25 mg Intravenous Once  . enoxaparin (LOVENOX) injection  40 mg Subcutaneous Q24H  . montelukast  10 mg Oral Daily  . pantoprazole  40 mg Oral Daily     Assessment: Diverticulitis with perforation  Plan: Pt  clinically improving.  Cr elevated: will give bolus and monitor UOP.  Cont NPO and IV abx.  Recheck wbc in AM   LOS: 1 day    Rosario Adie, MD [facility] Surgery, [place]    03/14/2021 10:09 AM

## 2021-03-15 ENCOUNTER — Inpatient Hospital Stay (HOSPITAL_COMMUNITY): Payer: Medicare Other

## 2021-03-15 ENCOUNTER — Ambulatory Visit: Payer: Medicare Other | Admitting: Internal Medicine

## 2021-03-15 ENCOUNTER — Telehealth: Payer: Self-pay | Admitting: Internal Medicine

## 2021-03-15 LAB — CBC
HCT: 29.5 % — ABNORMAL LOW (ref 36.0–46.0)
Hemoglobin: 9.5 g/dL — ABNORMAL LOW (ref 12.0–15.0)
MCH: 28 pg (ref 26.0–34.0)
MCHC: 32.2 g/dL (ref 30.0–36.0)
MCV: 87 fL (ref 80.0–100.0)
Platelets: 307 10*3/uL (ref 150–400)
RBC: 3.39 MIL/uL — ABNORMAL LOW (ref 3.87–5.11)
RDW: 15.4 % (ref 11.5–15.5)
WBC: 23.5 10*3/uL — ABNORMAL HIGH (ref 4.0–10.5)
nRBC: 0 % (ref 0.0–0.2)

## 2021-03-15 LAB — BASIC METABOLIC PANEL
Anion gap: 6 (ref 5–15)
BUN: 12 mg/dL (ref 8–23)
CO2: 27 mmol/L (ref 22–32)
Calcium: 8.2 mg/dL — ABNORMAL LOW (ref 8.9–10.3)
Chloride: 104 mmol/L (ref 98–111)
Creatinine, Ser: 1.15 mg/dL — ABNORMAL HIGH (ref 0.44–1.00)
GFR, Estimated: 52 mL/min — ABNORMAL LOW (ref >60)
Glucose, Bld: 147 mg/dL — ABNORMAL HIGH (ref 70–99)
Potassium: 3.5 mmol/L (ref 3.5–5.1)
Sodium: 137 mmol/L (ref 135–145)

## 2021-03-15 NOTE — Progress Notes (Signed)
   Progress Note     Subjective: Patient reports overall improvement in pain. She has not been passing flatus or having any BMs. Some intermittent nausea but no vomiting.   Objective: Vital signs in last 24 hours: Temp:  [98.7 F (37.1 C)-99.2 F (37.3 C)] 99.2 F (37.3 C) (05/23 0513) Pulse Rate:  [72-97] 97 (05/23 0513) Resp:  [15-18] 17 (05/23 0513) BP: (102-110)/(64-66) 110/66 (05/23 0513) SpO2:  [92 %-97 %] 92 % (05/23 0513) Last BM Date: 03/12/21  Intake/Output from previous day: 05/22 0701 - 05/23 0700 In: 1112.5 [P.O.:60; I.V.:970.4; IV Piggyback:82.2] Out: 400 [Urine:400] Intake/Output this shift: No intake/output data recorded.  PE: General: pleasant, WD, obese female who is laying in bed in NAD Heart: regular, rate, and rhythm. Lungs: Respiratory effort nonlabored Abd: soft, mild generalized ttp without peritonitis, moderately distended, no masses, hernias, or organomegaly MS: all 4 extremities are symmetrical with no cyanosis, clubbing, or edema. Skin: warm and dry with no masses, lesions, or rashes Psych: A&Ox3 with an appropriate affect.    Lab Results:  Recent Labs    03/14/21 0210 03/15/21 0127  WBC 34.0* 23.5*  HGB 10.9* 9.5*  HCT 33.2* 29.5*  PLT 336 307   BMET Recent Labs    03/14/21 0210 03/15/21 0127  NA 139 137  K 3.6 3.5  CL 102 104  CO2 28 27  GLUCOSE 137* 147*  BUN 16 12  CREATININE 1.29* 1.15*  CALCIUM 8.8* 8.2*   PT/INR No results for input(s): LABPROT, INR in the last 72 hours. CMP     Component Value Date/Time   NA 137 03/15/2021 0127   K 3.5 03/15/2021 0127   CL 104 03/15/2021 0127   CO2 27 03/15/2021 0127   GLUCOSE 147 (H) 03/15/2021 0127   BUN 12 03/15/2021 0127   CREATININE 1.15 (H) 03/15/2021 0127   CALCIUM 8.2 (L) 03/15/2021 0127   PROT 7.5 03/12/2021 1959   ALBUMIN 3.8 03/12/2021 1959   AST 17 03/12/2021 1959   ALT 18 03/12/2021 1959   ALKPHOS 58 03/12/2021 1959   BILITOT 1.4 (H) 03/12/2021 1959    GFRNONAA 52 (L) 03/15/2021 0127   Lipase     Component Value Date/Time   LIPASE 21 03/12/2021 1959       Studies/Results: No results found.  Anti-infectives: Anti-infectives (From admission, onward)   Start     Dose/Rate Route Frequency Ordered Stop   03/13/21 1400  piperacillin-tazobactam (ZOSYN) IVPB 3.375 g        3.375 g 12.5 mL/hr over 240 Minutes Intravenous Every 8 hours 03/13/21 0757     03/13/21 0615  piperacillin-tazobactam (ZOSYN) IVPB 3.375 g        3.375 g 12.5 mL/hr over 240 Minutes Intravenous  Once 03/13/21 0604 03/13/21 1247       Assessment/Plan HTN HLD GERD  Perforated sigmoid diverticulitis  - CT with perforated sigmoid diverticulitis with small to moderate pneumoperitoneum  Without abscess - WBC 23.5 from 34 - continue IV abx - abdominal pain improving - ok to have sips of clears  - if acutely worsening or failing conservative management may still require operative management   FEN: sips and chips, IVF VTE: lovenox ID: Zosyn 5/21>>  LOS: 2 days    [name], [facility] 03/15/2021, 8:37 AM Please see Amion for pager number during day hours 7:00am-4:30pm

## 2021-03-15 NOTE — Telephone Encounter (Signed)
[facility] FYI:  ---caller states that she is having abd. pain and shoulder-abd. pain coming and going pain of 9/10- lower and upper abd; denies other symptoms  Advised to go to ED now, patient understood

## 2021-03-16 ENCOUNTER — Telehealth: Payer: Self-pay | Admitting: Internal Medicine

## 2021-03-16 ENCOUNTER — Inpatient Hospital Stay (HOSPITAL_COMMUNITY): Payer: Medicare Other

## 2021-03-16 DIAGNOSIS — M7989 Other specified soft tissue disorders: Secondary | ICD-10-CM

## 2021-03-16 LAB — BASIC METABOLIC PANEL
Anion gap: 9 (ref 5–15)
BUN: 8 mg/dL (ref 8–23)
CO2: 24 mmol/L (ref 22–32)
Calcium: 8.1 mg/dL — ABNORMAL LOW (ref 8.9–10.3)
Chloride: 105 mmol/L (ref 98–111)
Creatinine, Ser: 0.96 mg/dL (ref 0.44–1.00)
GFR, Estimated: >60 mL/min (ref >60)
Glucose, Bld: 149 mg/dL — ABNORMAL HIGH (ref 70–99)
Potassium: 3 mmol/L — ABNORMAL LOW (ref 3.5–5.1)
Sodium: 138 mmol/L (ref 135–145)

## 2021-03-16 LAB — CBC
HCT: 27.3 % — ABNORMAL LOW (ref 36.0–46.0)
Hemoglobin: 9.2 g/dL — ABNORMAL LOW (ref 12.0–15.0)
MCH: 28.6 pg (ref 26.0–34.0)
MCHC: 33.7 g/dL (ref 30.0–36.0)
MCV: 84.8 fL (ref 80.0–100.0)
Platelets: 323 10*3/uL (ref 150–400)
RBC: 3.22 MIL/uL — ABNORMAL LOW (ref 3.87–5.11)
RDW: 15 % (ref 11.5–15.5)
WBC: 16.8 10*3/uL — ABNORMAL HIGH (ref 4.0–10.5)
nRBC: 0 % (ref 0.0–0.2)

## 2021-03-16 MED ORDER — IOHEXOL 9 MG/ML PO SOLN
ORAL | Status: AC
  Administered 2021-03-16: 500 mL
  Filled 2021-03-16: qty 1000

## 2021-03-16 MED ORDER — POTASSIUM CHLORIDE 10 MEQ/100ML IV SOLN
10.0000 meq | INTRAVENOUS | Status: AC
Start: 2021-03-16 — End: 2021-03-16
  Administered 2021-03-16 (×6): 10 meq via INTRAVENOUS
  Filled 2021-03-16 (×6): qty 100

## 2021-03-16 MED ORDER — KCL IN DEXTROSE-NACL 20-5-0.45 MEQ/L-%-% IV SOLN
INTRAVENOUS | Status: DC
  Filled 2021-03-16 (×4): qty 1000

## 2021-03-16 NOTE — Telephone Encounter (Signed)
Attempted to call the number back X2 but no answer and unable to leave a message.

## 2021-03-16 NOTE — Plan of Care (Signed)

## 2021-03-16 NOTE — Telephone Encounter (Signed)
Inbound call from patient's husband requesting a call from Dr. Hilarie Fredrickson please.  States patient has been admitted to [facility] ED for diverticulitis and before having anything done would like for Dr. Hilarie Fredrickson to consult with the surgeon at the hospital.  Please advise.

## 2021-03-16 NOTE — Progress Notes (Signed)
Progress Note     Subjective: Patient reports she is passing flatus but no BM. Denies nausea. Took in some ice chips and water yesterday but that is really all. She reports abdominal pain is maybe slightly better. Still having some pain with urination.   Objective: Vital signs in last 24 hours: Temp:  [98 F (36.7 C)-100.5 F (38.1 C)] 100.5 F (38.1 C) (05/24 0617) Pulse Rate:  [83-94] 85 (05/24 0617) Resp:  [17-18] 18 (05/24 0617) BP: (111-123)/(71-75) 123/73 (05/24 0617) SpO2:  [92 %-97 %] 97 % (05/24 0617) Last BM Date: 03/12/21  Intake/Output from previous day: 05/23 0701 - 05/24 0700 In: 3095.6 [P.O.:150; I.V.:2806.1; IV Piggyback:139.5] Out: 620 [Urine:620] Intake/Output this shift: No intake/output data recorded.  PE: General: pleasant, WD, obese female who is laying in bed in NAD Heart: regular, rate, and rhythm. Lungs: Respiratory effort nonlabored Abd: soft, mild generalized ttp without peritonitis, moderately distended, no masses, hernias, or organomegaly MS: edema of LUE  Skin: warm and dry with no masses, lesions, or rashes Psych: A&Ox3 with an appropriate affect.    Lab Results:  Recent Labs    03/15/21 0127 03/16/21 0205  WBC 23.5* 16.8*  HGB 9.5* 9.2*  HCT 29.5* 27.3*  PLT 307 323   BMET Recent Labs    03/15/21 0127 03/16/21 0205  NA 137 138  K 3.5 3.0*  CL 104 105  CO2 27 24  GLUCOSE 147* 149*  BUN 12 8  CREATININE 1.15* 0.96  CALCIUM 8.2* 8.1*   PT/INR No results for input(s): LABPROT, INR in the last 72 hours. CMP     Component Value Date/Time   NA 138 03/16/2021 0205   K 3.0 (L) 03/16/2021 0205   CL 105 03/16/2021 0205   CO2 24 03/16/2021 0205   GLUCOSE 149 (H) 03/16/2021 0205   BUN 8 03/16/2021 0205   CREATININE 0.96 03/16/2021 0205   CALCIUM 8.1 (L) 03/16/2021 0205   PROT 7.5 03/12/2021 1959   ALBUMIN 3.8 03/12/2021 1959   AST 17 03/12/2021 1959   ALT 18 03/12/2021 1959   ALKPHOS 58 03/12/2021 1959   BILITOT 1.4  (H) 03/12/2021 1959   GFRNONAA >60 03/16/2021 0205   Lipase     Component Value Date/Time   LIPASE 21 03/12/2021 1959       Studies/Results: DG Abd Portable 1V  Result Date: 03/15/2021 CLINICAL DATA:  Ileus. EXAM: PORTABLE ABDOMEN - 1 VIEW COMPARISON:  CT abdomen and pelvis 03/13/2021 FINDINGS: AP supine view of the abdomen and pelvis was obtained. There is colonic bowel gas. Focus of bowel gas in the mid abdomen. Overall, nonobstructive bowel gas pattern. Patient had pneumoperitoneum on recent CT but this is poorly characterized on this single supine image. No large abdominal calcifications. IMPRESSION: 1. Nonobstructive bowel gas pattern. 2. Limited evaluation for pneumoperitoneum. Electronically Signed   By: Markus Daft M.D.   On: 03/15/2021 11:34    Anti-infectives: Anti-infectives (From admission, onward)   Start     Dose/Rate Route Frequency Ordered Stop   03/13/21 1400  piperacillin-tazobactam (ZOSYN) IVPB 3.375 g        3.375 g 12.5 mL/hr over 240 Minutes Intravenous Every 8 hours 03/13/21 0757     03/13/21 0615  piperacillin-tazobactam (ZOSYN) IVPB 3.375 g        3.375 g 12.5 mL/hr over 240 Minutes Intravenous  Once 03/13/21 0604 03/13/21 1247       Assessment/Plan HTN HLD GERD LUE edema - check [place] today  Perforated sigmoid diverticulitis  - CT with perforated sigmoid diverticulitis with small to moderate pneumoperitoneum  Without abscess - WBC 16 from 23 - continue IV abx - abdominal pain improving - ok to have sips of clears  - repeat CT AP today with PO contrast - if acutely worsening or failing conservative management may still require operative management   FEN: sips and chips, IVF, replace K VTE: lovenox ID: Zosyn 5/21>>  LOS: 3 days    [name], [name] Surgery 03/16/2021, 8:31 AM Please see Amion for pager number during day hours 7:00am-4:30pm

## 2021-03-16 NOTE — Progress Notes (Signed)
Upper extremity venous has been completed.   Preliminary results in CV Proc.   Abram Sander 03/16/2021 9:37 AM

## 2021-03-16 NOTE — Care Management Important Message (Signed)
Important Message  Patient Details  Name: Regina Roberts MRN: [id number] Date of Birth: 1951/05/17   Medicare Important Message Given:  Yes     Elizandro Laura Montine Circle 03/16/2021, 4:10 PM

## 2021-03-16 NOTE — Telephone Encounter (Signed)
Patients husband called and was wanting to know if Dr. Alain Marion was aware that the patient was in the hospital for diverticulitis. Please advise

## 2021-03-17 ENCOUNTER — Inpatient Hospital Stay: Payer: Self-pay

## 2021-03-17 LAB — CBC
HCT: 29.4 % — ABNORMAL LOW (ref 36.0–46.0)
Hemoglobin: 9.8 g/dL — ABNORMAL LOW (ref 12.0–15.0)
MCH: 28.4 pg (ref 26.0–34.0)
MCHC: 33.3 g/dL (ref 30.0–36.0)
MCV: 85.2 fL (ref 80.0–100.0)
Platelets: 360 10*3/uL (ref 150–400)
RBC: 3.45 MIL/uL — ABNORMAL LOW (ref 3.87–5.11)
RDW: 15 % (ref 11.5–15.5)
WBC: 14.8 10*3/uL — ABNORMAL HIGH (ref 4.0–10.5)
nRBC: 0 % (ref 0.0–0.2)

## 2021-03-17 LAB — BASIC METABOLIC PANEL
Anion gap: 8 (ref 5–15)
BUN: 7 mg/dL — ABNORMAL LOW (ref 8–23)
CO2: 24 mmol/L (ref 22–32)
Calcium: 8.5 mg/dL — ABNORMAL LOW (ref 8.9–10.3)
Chloride: 105 mmol/L (ref 98–111)
Creatinine, Ser: 0.98 mg/dL (ref 0.44–1.00)
GFR, Estimated: >60 mL/min (ref >60)
Glucose, Bld: 119 mg/dL — ABNORMAL HIGH (ref 70–99)
Potassium: 3.5 mmol/L (ref 3.5–5.1)
Sodium: 137 mmol/L (ref 135–145)

## 2021-03-17 LAB — MAGNESIUM: Magnesium: 1.9 mg/dL (ref 1.7–2.4)

## 2021-03-17 LAB — PREALBUMIN: Prealbumin: 7.8 mg/dL — ABNORMAL LOW (ref 18–38)

## 2021-03-17 MED ORDER — ENSURE SURGERY PO LIQD
237.0000 mL | Freq: Two times a day (BID) | ORAL | Status: DC
  Administered 2021-03-17: 237 mL via ORAL
  Filled 2021-03-17 (×4): qty 237

## 2021-03-17 NOTE — Progress Notes (Signed)
Progress Note     Subjective: Patient reports abdominal pain continues to improve a little. She is still passing flatus but no BM. She denies nausea this AM. CT yesterday appeared stable. She reports less abdominal discomfort with urination.   Objective: Vital signs in last 24 hours: Temp:  [98.8 F (37.1 C)-99.4 F (37.4 C)] 99.4 F (37.4 C) (05/25 0402) Pulse Rate:  [81-88] 87 (05/25 0402) Resp:  [18-20] 18 (05/25 0402) BP: (132-138)/(76-85) 135/78 (05/25 0402) SpO2:  [95 %-97 %] 96 % (05/25 0402) Last BM Date: 03/12/21  Intake/Output from previous day: 05/24 0701 - 05/25 0700 In: 2450.3 [I.V.:1632; IV Piggyback:818.3] Out: 200 [Urine:200] Intake/Output this shift: Total I/O In: -  Out: 400 [Urine:400]  PE: General: pleasant, WD,obesefemale who is laying in bed in NAD Heart: regular, rate, and rhythm. Lungs: Respiratory effort nonlabored Abd: soft,mild generalized ttp without peritonitis,moderately distended MS: edema of LUE improving  Skin: warm and dry with no masses, lesions, or rashes Psych: A&Ox3 with an appropriate affect.    Lab Results:  Recent Labs    03/15/21 0127 03/16/21 0205  WBC 23.5* 16.8*  HGB 9.5* 9.2*  HCT 29.5* 27.3*  PLT 307 323   BMET Recent Labs    03/15/21 0127 03/16/21 0205  NA 137 138  K 3.5 3.0*  CL 104 105  CO2 27 24  GLUCOSE 147* 149*  BUN 12 8  CREATININE 1.15* 0.96  CALCIUM 8.2* 8.1*   PT/INR No results for input(s): LABPROT, INR in the last 72 hours. CMP     Component Value Date/Time   NA 138 03/16/2021 0205   K 3.0 (L) 03/16/2021 0205   CL 105 03/16/2021 0205   CO2 24 03/16/2021 0205   GLUCOSE 149 (H) 03/16/2021 0205   BUN 8 03/16/2021 0205   CREATININE 0.96 03/16/2021 0205   CALCIUM 8.1 (L) 03/16/2021 0205   PROT 7.5 03/12/2021 1959   ALBUMIN 3.8 03/12/2021 1959   AST 17 03/12/2021 1959   ALT 18 03/12/2021 1959   ALKPHOS 58 03/12/2021 1959   BILITOT 1.4 (H) 03/12/2021 1959   GFRNONAA >60  03/16/2021 0205   Lipase     Component Value Date/Time   LIPASE 21 03/12/2021 1959       Studies/Results: CT ABDOMEN PELVIS WO CONTRAST  Result Date: 03/16/2021 CLINICAL DATA:  Generalized abdominal pain. EXAM: CT ABDOMEN AND PELVIS WITHOUT CONTRAST TECHNIQUE: Multidetector CT imaging of the abdomen and pelvis was performed following the standard protocol without IV contrast. COMPARISON:  Mar 13, 2021. FINDINGS: Lower chest: Minimal bilateral pleural effusions are noted with adjacent subsegmental atelectasis. Hepatobiliary: No focal liver abnormality is seen. No gallstones, gallbladder wall thickening, or biliary dilatation. Pancreas: Unremarkable. No pancreatic ductal dilatation or surrounding inflammatory changes. Spleen: Normal in size without focal abnormality. Adrenals/Urinary Tract: Adrenal glands appear normal. Right renal cyst is noted. No hydronephrosis or renal obstruction is noted. Urinary bladder is unremarkable. Stomach/Bowel: The stomach appears normal. There is no evidence of bowel obstruction. There is continued presence of sigmoid diverticulitis. Grossly stable pneumoperitoneum is noted, most prominently seen in the central and upper abdomen, consistent with perforation. Vascular/Lymphatic: Aortic atherosclerosis. No enlarged abdominal or pelvic lymph nodes. Reproductive: Status post hysterectomy. No adnexal masses. Other: No hernia is noted. No definite fluid collection or abscess is noted. Musculoskeletal: No acute or significant osseous findings. IMPRESSION: Stable sigmoid diverticulitis is noted with stable pneumoperitoneum present, most prominently in the central and upper portions of the abdomen. This is consistent with  perforation related to diverticulitis as noted on prior exam. Minimal bilateral pleural effusions are noted with adjacent subsegmental atelectasis. Aortic Atherosclerosis (ICD10-I70.0). Electronically Signed   By: Marijo Conception M.D.   On: 03/16/2021 14:09   DG  Abd Portable 1V  Result Date: 03/15/2021 CLINICAL DATA:  Ileus. EXAM: PORTABLE ABDOMEN - 1 VIEW COMPARISON:  CT abdomen and pelvis 03/13/2021 FINDINGS: AP supine view of the abdomen and pelvis was obtained. There is colonic bowel gas. Focus of bowel gas in the mid abdomen. Overall, nonobstructive bowel gas pattern. Patient had pneumoperitoneum on recent CT but this is poorly characterized on this single supine image. No large abdominal calcifications. IMPRESSION: 1. Nonobstructive bowel gas pattern. 2. Limited evaluation for pneumoperitoneum. Electronically Signed   By: Markus Daft M.D.   On: 03/15/2021 11:34   VAS [place] UPPER EXTREMITY VENOUS DUPLEX  Result Date: 03/16/2021 UPPER VENOUS STUDY  Patient Name:  Regina Roberts  Date of Exam:   03/16/2021 Medical Rec #: [id number]               Accession #:    [id number] Date of Birth: 10-19-1951               Patient Gender: F Patient Age:   70Y Exam Location:  [name] Procedure:      VAS [place] UPPER EXTREMITY VENOUS DUPLEX Referring Phys: [id number] [name] --------------------------------------------------------------------------------  Indications: Swelling Comparison Study: no prior Performing Technologist: Abram Sander RVS  Examination Guidelines: A complete evaluation includes B-mode imaging, spectral Doppler, color Doppler, and power Doppler as needed of all accessible portions of each vessel. Bilateral testing is considered an integral part of a complete examination. Limited examinations for reoccurring indications may be performed as noted.  Right Findings: +-----+------------+---------+-----------+----------+-------+ RIGHTCompressiblePhasicitySpontaneousPropertiesSummary +-----+------------+---------+-----------+----------+-------+ IJV                 Yes       Yes                      +-----+------------+---------+-----------+----------+-------+  Left Findings:  +----------+------------+---------+-----------+----------+-------+ LEFT      CompressiblePhasicitySpontaneousPropertiesSummary +----------+------------+---------+-----------+----------+-------+ IJV           Full       Yes       Yes                      +----------+------------+---------+-----------+----------+-------+ Subclavian    Full       Yes       Yes                      +----------+------------+---------+-----------+----------+-------+ Axillary      Full       Yes       Yes                      +----------+------------+---------+-----------+----------+-------+ Brachial      Full                                          +----------+------------+---------+-----------+----------+-------+ Radial        Full                                          +----------+------------+---------+-----------+----------+-------+ Ulnar  Full                                          +----------+------------+---------+-----------+----------+-------+ Cephalic      Full                                          +----------+------------+---------+-----------+----------+-------+ Basilic       Full                                          +----------+------------+---------+-----------+----------+-------+  Summary:  Left: No evidence of deep vein thrombosis in the upper extremity. No evidence of superficial vein thrombosis in the upper extremity. No evidence of thrombosis in the subclavian.  *See table(s) above for measurements and observations.  Diagnosing physician: Deitra Mayo MD Electronically signed by Deitra Mayo MD on 03/16/2021 at 2:12:52 PM.    Final     Anti-infectives: Anti-infectives (From admission, onward)   Start     Dose/Rate Route Frequency Ordered Stop   03/13/21 1400  piperacillin-tazobactam (ZOSYN) IVPB 3.375 g        3.375 g 12.5 mL/hr over 240 Minutes Intravenous Every 8 hours 03/13/21 0757     03/13/21 0615   piperacillin-tazobactam (ZOSYN) IVPB 3.375 g        3.375 g 12.5 mL/hr over 240 Minutes Intravenous  Once 03/13/21 0604 03/13/21 1247       Assessment/Plan HTN HLD GERD LUE edema - improving, [place] 5/24 negative for SVT or DVT  Perforatedsigmoiddiverticulitis  -CT with perforated sigmoid diverticulitis with small to moderate pneumoperitoneum without abscess - repeat CT 5/24 stable - WBC continues to trend down and pain continues to improve - still not having BMs and prealbumin 7.8 - will consider PICC and TPN today since I expect it may still be a while before she is really taking much PO  - if acutely worsening or failing conservative management may still require operative management  FEN: sips and chips, IVF VTE: lovenox ID: Zosyn 5/21>>  LOS: 4 days    [name], [facility] Surgery 03/17/2021, 8:23 AM Please see Amion for pager number during day hours 7:00am-4:30pm

## 2021-03-17 NOTE — Telephone Encounter (Signed)
I was.  Hope she is getting better.  Thanks

## 2021-03-17 NOTE — Telephone Encounter (Signed)
Notified pt husband w/MD response.Marland KitchenJohny Roberts

## 2021-03-17 NOTE — Telephone Encounter (Signed)
Pt currently admitted.

## 2021-03-18 LAB — CBC
HCT: 28.7 % — ABNORMAL LOW (ref 36.0–46.0)
Hemoglobin: 9.5 g/dL — ABNORMAL LOW (ref 12.0–15.0)
MCH: 28.2 pg (ref 26.0–34.0)
MCHC: 33.1 g/dL (ref 30.0–36.0)
MCV: 85.2 fL (ref 80.0–100.0)
Platelets: 363 10*3/uL (ref 150–400)
RBC: 3.37 MIL/uL — ABNORMAL LOW (ref 3.87–5.11)
RDW: 14.8 % (ref 11.5–15.5)
WBC: 14.9 10*3/uL — ABNORMAL HIGH (ref 4.0–10.5)
nRBC: 0.2 % (ref 0.0–0.2)

## 2021-03-18 LAB — BASIC METABOLIC PANEL
Anion gap: 8 (ref 5–15)
BUN: 6 mg/dL — ABNORMAL LOW (ref 8–23)
CO2: 23 mmol/L (ref 22–32)
Calcium: 8.3 mg/dL — ABNORMAL LOW (ref 8.9–10.3)
Chloride: 105 mmol/L (ref 98–111)
Creatinine, Ser: 0.96 mg/dL (ref 0.44–1.00)
GFR, Estimated: >60 mL/min (ref >60)
Glucose, Bld: 116 mg/dL — ABNORMAL HIGH (ref 70–99)
Potassium: 3.6 mmol/L (ref 3.5–5.1)
Sodium: 136 mmol/L (ref 135–145)

## 2021-03-18 MED ORDER — SODIUM CHLORIDE 0.9% FLUSH: 3.0000 mL | INTRAVENOUS | Status: DC | PRN

## 2021-03-18 MED ORDER — SODIUM CHLORIDE 0.9 % IV SOLN: 250.0000 mL | INTRAVENOUS | Status: DC | PRN

## 2021-03-18 MED ORDER — ENSURE SURGERY PO LIQD
237.0000 mL | Freq: Three times a day (TID) | ORAL | Status: DC
  Administered 2021-03-18 (×3): 237 mL via ORAL
  Filled 2021-03-18 (×6): qty 237

## 2021-03-18 MED ORDER — POTASSIUM CHLORIDE CRYS ER 20 MEQ PO TBCR
40.0000 meq | EXTENDED_RELEASE_TABLET | Freq: Two times a day (BID) | ORAL | Status: AC
  Administered 2021-03-18 (×2): 40 meq via ORAL
  Filled 2021-03-18 (×2): qty 2

## 2021-03-18 MED ORDER — SODIUM CHLORIDE 0.9% FLUSH
3.0000 mL | Freq: Two times a day (BID) | INTRAVENOUS | Status: DC
  Administered 2021-03-18 – 2021-03-30 (×16): 3 mL via INTRAVENOUS

## 2021-03-18 MED ORDER — AMOXICILLIN-POT CLAVULANATE 875-125 MG PO TABS
1.0000 | ORAL_TABLET | Freq: Two times a day (BID) | ORAL | Status: DC
  Administered 2021-03-18 – 2021-03-19 (×3): 1 via ORAL
  Filled 2021-03-18 (×3): qty 1

## 2021-03-18 NOTE — Plan of Care (Signed)

## 2021-03-18 NOTE — Progress Notes (Signed)
Progress Note     Subjective: Patient reports she drank about 1/2 bowl of soup and 1/2 ensure and some water yesterday. She denies worsening pain with PO intake. She denies nausea. She is passing flatus but has not had a BM. She would really like to avoid surgery at this time if possible.   Objective: Vital signs in last 24 hours: Temp:  [98.9 F (37.2 C)-99.5 F (37.5 C)] 98.9 F (37.2 C) (05/26 0522) Pulse Rate:  [80-84] 80 (05/26 0522) Resp:  [16-18] 18 (05/26 0522) BP: (133-148)/(71-85) 133/79 (05/26 0522) SpO2:  [94 %-95 %] 95 % (05/26 0522) Last BM Date: 03/12/21  Intake/Output from previous day: 05/25 0701 - 05/26 0700 In: 290 [P.O.:240; IV Piggyback:50] Out: 1300 [Urine:1300] Intake/Output this shift: No intake/output data recorded.  PE: General: pleasant, WD,obesefemale who is laying in bed in NAD Heart: regular, rate, and rhythm. Lungs: Respiratory effort nonlabored Abd: soft,mild generalized ttp without peritonitis, distended, BS hypoactive AJ:OINOM of LUEimproving  Skin: warm and dry with no masses, lesions, or rashes Psych: A&Ox3 with an appropriate affect.   Lab Results:  Recent Labs    03/17/21 0810 03/18/21 0055  WBC 14.8* 14.9*  HGB 9.8* 9.5*  HCT 29.4* 28.7*  PLT 360 363   BMET Recent Labs    03/17/21 0810 03/18/21 0055  NA 137 136  K 3.5 3.6  CL 105 105  CO2 24 23  GLUCOSE 119* 116*  BUN 7* 6*  CREATININE 0.98 0.96  CALCIUM 8.5* 8.3*   PT/INR No results for input(s): LABPROT, INR in the last 72 hours. CMP     Component Value Date/Time   NA 136 03/18/2021 0055   K 3.6 03/18/2021 0055   CL 105 03/18/2021 0055   CO2 23 03/18/2021 0055   GLUCOSE 116 (H) 03/18/2021 0055   BUN 6 (L) 03/18/2021 0055   CREATININE 0.96 03/18/2021 0055   CALCIUM 8.3 (L) 03/18/2021 0055   PROT 7.5 03/12/2021 1959   ALBUMIN 3.8 03/12/2021 1959   AST 17 03/12/2021 1959   ALT 18 03/12/2021 1959   ALKPHOS 58 03/12/2021 1959   BILITOT 1.4 (H)  03/12/2021 1959   GFRNONAA >60 03/18/2021 0055   Lipase     Component Value Date/Time   LIPASE 21 03/12/2021 1959       Studies/Results: CT ABDOMEN PELVIS WO CONTRAST  Result Date: 03/16/2021 CLINICAL DATA:  Generalized abdominal pain. EXAM: CT ABDOMEN AND PELVIS WITHOUT CONTRAST TECHNIQUE: Multidetector CT imaging of the abdomen and pelvis was performed following the standard protocol without IV contrast. COMPARISON:  Mar 13, 2021. FINDINGS: Lower chest: Minimal bilateral pleural effusions are noted with adjacent subsegmental atelectasis. Hepatobiliary: No focal liver abnormality is seen. No gallstones, gallbladder wall thickening, or biliary dilatation. Pancreas: Unremarkable. No pancreatic ductal dilatation or surrounding inflammatory changes. Spleen: Normal in size without focal abnormality. Adrenals/Urinary Tract: Adrenal glands appear normal. Right renal cyst is noted. No hydronephrosis or renal obstruction is noted. Urinary bladder is unremarkable. Stomach/Bowel: The stomach appears normal. There is no evidence of bowel obstruction. There is continued presence of sigmoid diverticulitis. Grossly stable pneumoperitoneum is noted, most prominently seen in the central and upper abdomen, consistent with perforation. Vascular/Lymphatic: Aortic atherosclerosis. No enlarged abdominal or pelvic lymph nodes. Reproductive: Status post hysterectomy. No adnexal masses. Other: No hernia is noted. No definite fluid collection or abscess is noted. Musculoskeletal: No acute or significant osseous findings. IMPRESSION: Stable sigmoid diverticulitis is noted with stable pneumoperitoneum present, most prominently in the central  and upper portions of the abdomen. This is consistent with perforation related to diverticulitis as noted on prior exam. Minimal bilateral pleural effusions are noted with adjacent subsegmental atelectasis. Aortic Atherosclerosis (ICD10-I70.0). Electronically Signed   By: Marijo Conception  M.D.   On: 03/16/2021 14:09   VAS [place] UPPER EXTREMITY VENOUS DUPLEX  Result Date: 03/16/2021 UPPER VENOUS STUDY  Patient Name:  Regina Roberts  Date of Exam:   03/16/2021 Medical Rec #: [id number]               Accession #:    [id number] Date of Birth: [birth year]               Patient Gender: F Patient Age:   70Y Exam Location:  [facility] Procedure:      VAS [place] UPPER EXTREMITY VENOUS DUPLEX Referring Phys: [id number] Regina Roberts --------------------------------------------------------------------------------  Indications: Swelling Comparison Study: no prior Performing Technologist: Abram Sander RVS  Examination Guidelines: A complete evaluation includes B-mode imaging, spectral Doppler, color Doppler, and power Doppler as needed of all accessible portions of each vessel. Bilateral testing is considered an integral part of a complete examination. Limited examinations for reoccurring indications may be performed as noted.  Right Findings: +-----+------------+---------+-----------+----------+-------+ RIGHTCompressiblePhasicitySpontaneousPropertiesSummary +-----+------------+---------+-----------+----------+-------+ IJV                 Yes       Yes                      +-----+------------+---------+-----------+----------+-------+  Left Findings: +----------+------------+---------+-----------+----------+-------+ LEFT      CompressiblePhasicitySpontaneousPropertiesSummary +----------+------------+---------+-----------+----------+-------+ IJV           Full       Yes       Yes                      +----------+------------+---------+-----------+----------+-------+ Subclavian    Full       Yes       Yes                      +----------+------------+---------+-----------+----------+-------+ Axillary      Full       Yes       Yes                      +----------+------------+---------+-----------+----------+-------+ Brachial      Full                                           +----------+------------+---------+-----------+----------+-------+ Radial        Full                                          +----------+------------+---------+-----------+----------+-------+ Ulnar         Full                                          +----------+------------+---------+-----------+----------+-------+ Cephalic      Full                                          +----------+------------+---------+-----------+----------+-------+  Basilic       Full                                          +----------+------------+---------+-----------+----------+-------+  Summary:  Left: No evidence of deep vein thrombosis in the upper extremity. No evidence of superficial vein thrombosis in the upper extremity. No evidence of thrombosis in the subclavian.  *See table(s) above for measurements and observations.  Diagnosing physician: Deitra Mayo MD Electronically signed by Deitra Mayo MD on 03/16/2021 at 2:12:52 PM.    Final    [place] EKG SITE RITE  Result Date: 03/17/2021 If Site Rite image not attached, placement could not be confirmed due to current cardiac rhythm.   Anti-infectives: Anti-infectives (From admission, onward)   Start     Dose/Rate Route Frequency Ordered Stop   03/18/21 1000  amoxicillin-clavulanate (AUGMENTIN) 875-125 MG per tablet 1 tablet        1 tablet Oral Every 12 hours 03/18/21 0759     03/13/21 1400  piperacillin-tazobactam (ZOSYN) IVPB 3.375 g  Status:  Discontinued        3.375 g 12.5 mL/hr over 240 Minutes Intravenous Every 8 hours 03/13/21 0757 03/18/21 0759   03/13/21 0615  piperacillin-tazobactam (ZOSYN) IVPB 3.375 g        3.375 g 12.5 mL/hr over 240 Minutes Intravenous  Once 03/13/21 0604 03/13/21 1247       Assessment/Plan HTN HLD GERD LUE edema - improving, [place] 5/24 negative for SVT or DVT  Perforatedsigmoiddiverticulitis  -CT with perforated sigmoid diverticulitis with small to moderate  pneumoperitoneum without abscess - repeat CT 5/24 stable - WBC stable at 14 - patient tolerated some FLD and having flatus, no BM yet - she would like to try switch to PO abx and soft diet today  - if unable to tolerate soft diet and PO abx then will likely need surgery  - continue supplements TID, prealbumin 7.8 - if patient requires surgery she would almost certainly require a colostomy   FEN: soft diet, SLIV VTE: lovenox ID: Zosyn 5/21>5/26; PO Augmentin 5/26>>  LOS: 5 days    Regina Roberts, [facility] Surgery 03/18/2021, 8:00 AM Please see Amion for pager number during day hours 7:00am-4:30pm

## 2021-03-19 ENCOUNTER — Inpatient Hospital Stay (HOSPITAL_COMMUNITY): Payer: Medicare Other

## 2021-03-19 LAB — BASIC METABOLIC PANEL
Anion gap: 6 (ref 5–15)
BUN: 7 mg/dL — ABNORMAL LOW (ref 8–23)
CO2: 28 mmol/L (ref 22–32)
Calcium: 8.8 mg/dL — ABNORMAL LOW (ref 8.9–10.3)
Chloride: 103 mmol/L (ref 98–111)
Creatinine, Ser: 0.95 mg/dL (ref 0.44–1.00)
GFR, Estimated: >60 mL/min (ref >60)
Glucose, Bld: 128 mg/dL — ABNORMAL HIGH (ref 70–99)
Potassium: 4.2 mmol/L (ref 3.5–5.1)
Sodium: 137 mmol/L (ref 135–145)

## 2021-03-19 LAB — CBC
HCT: 30 % — ABNORMAL LOW (ref 36.0–46.0)
Hemoglobin: 10 g/dL — ABNORMAL LOW (ref 12.0–15.0)
MCH: 28.4 pg (ref 26.0–34.0)
MCHC: 33.3 g/dL (ref 30.0–36.0)
MCV: 85.2 fL (ref 80.0–100.0)
Platelets: 424 10*3/uL — ABNORMAL HIGH (ref 150–400)
RBC: 3.52 MIL/uL — ABNORMAL LOW (ref 3.87–5.11)
RDW: 15.1 % (ref 11.5–15.5)
WBC: 17.9 10*3/uL — ABNORMAL HIGH (ref 4.0–10.5)
nRBC: 0.2 % (ref 0.0–0.2)

## 2021-03-19 MED ORDER — SODIUM CHLORIDE 0.9 % IV SOLN
1.0000 g | INTRAVENOUS | Status: DC
  Administered 2021-03-19 – 2021-03-23 (×5): 1000 mg via INTRAVENOUS
  Filled 2021-03-19 (×6): qty 1

## 2021-03-19 MED ORDER — SODIUM CHLORIDE 0.9 % IV SOLN: INTRAVENOUS | Status: AC

## 2021-03-19 MED ORDER — IOHEXOL 9 MG/ML PO SOLN
ORAL | Status: AC
  Filled 2021-03-19: qty 1000

## 2021-03-19 NOTE — Progress Notes (Signed)
CT report called to me but I see that Dr. Donne Hazel has addressed it.

## 2021-03-19 NOTE — Progress Notes (Signed)
Progress Note     Subjective: Patient still adamant that she would like to avoid surgery. Abdomen less sore. She is still passing flatus, no BM.   Objective: Vital signs in last 24 hours: Temp:  [98.2 F (36.8 C)-99.8 F (37.7 C)] 98.2 F (36.8 C) (05/27 0530) Pulse Rate:  [80-94] 88 (05/27 0530) Resp:  [18-19] 19 (05/27 0530) BP: (136-143)/(56-85) 143/82 (05/27 0530) SpO2:  [94 %-99 %] 94 % (05/27 0530) Last BM Date: 03/12/21  Intake/Output from previous day: 05/26 0701 - 05/27 0700 In: 480 [P.O.:480] Out: -  Intake/Output this shift: Total I/O In: -  Out: 850 [Urine:850]  PE: General: pleasant, WD,obesefemale who is laying in bed in NAD Heart: regular, rate, and rhythm. Lungs: Respiratory effort nonlabored Abd: soft,mild generalized ttp without peritonitis, distended, BS hypoactive IH:KVQQV of LUEimproving Skin: warm and dry with no masses, lesions, or rashes Psych: A&Ox3 with an appropriate affect.   Lab Results:  Recent Labs    03/18/21 0055 03/19/21 0137  WBC 14.9* 17.9*  HGB 9.5* 10.0*  HCT 28.7* 30.0*  PLT 363 424*   BMET Recent Labs    03/18/21 0055 03/19/21 0137  NA 136 137  K 3.6 4.2  CL 105 103  CO2 23 28  GLUCOSE 116* 128*  BUN 6* 7*  CREATININE 0.96 0.95  CALCIUM 8.3* 8.8*   PT/INR No results for input(s): LABPROT, INR in the last 72 hours. CMP     Component Value Date/Time   NA 137 03/19/2021 0137   K 4.2 03/19/2021 0137   CL 103 03/19/2021 0137   CO2 28 03/19/2021 0137   GLUCOSE 128 (H) 03/19/2021 0137   BUN 7 (L) 03/19/2021 0137   CREATININE 0.95 03/19/2021 0137   CALCIUM 8.8 (L) 03/19/2021 0137   PROT 7.5 03/12/2021 1959   ALBUMIN 3.8 03/12/2021 1959   AST 17 03/12/2021 1959   ALT 18 03/12/2021 1959   ALKPHOS 58 03/12/2021 1959   BILITOT 1.4 (H) 03/12/2021 1959   GFRNONAA >60 03/19/2021 0137   Lipase     Component Value Date/Time   LIPASE 21 03/12/2021 1959       Studies/Results: [place] EKG SITE  RITE  Result Date: 03/17/2021 If Site Rite image not attached, placement could not be confirmed due to current cardiac rhythm.   Anti-infectives: Anti-infectives (From admission, onward)   Start     Dose/Rate Route Frequency Ordered Stop   03/18/21 1200  amoxicillin-clavulanate (AUGMENTIN) 875-125 MG per tablet 1 tablet        1 tablet Oral Every 12 hours 03/18/21 0759     03/13/21 1400  piperacillin-tazobactam (ZOSYN) IVPB 3.375 g  Status:  Discontinued        3.375 g 12.5 mL/hr over 240 Minutes Intravenous Every 8 hours 03/13/21 0757 03/18/21 0759   03/13/21 0615  piperacillin-tazobactam (ZOSYN) IVPB 3.375 g        3.375 g 12.5 mL/hr over 240 Minutes Intravenous  Once 03/13/21 0604 03/13/21 1247       Assessment/Plan HTN HLD GERD LUE edema -improving, [place] 5/24 negative for SVT or DVT  Perforatedsigmoiddiverticulitis  -CT with perforated sigmoid diverticulitis with small to moderate pneumoperitoneumwithout abscess -repeat CT 5/24 stable - WBC increased today to 14 - transitioned to PO abx and soft diet yesterday  - pt still really hoping to avoid surgery - with worsening leukocytosis would like to repeat CT today  - continue supplements TID, prealbumin 7.8 - if patient requires surgery she would almost  certainly require a colostomy   FEN: ice chips/sips VTE: lovenox ID: Zosyn 5/21>5/26; PO Augmentin 5/26>>  LOS: 6 days    [name], [facility] [facility] 03/19/2021, 8:50 AM Please see Amion for pager number during day hours 7:00am-4:30pm

## 2021-03-19 NOTE — Progress Notes (Signed)
Patient ID: Regina Roberts, female   DOB: 05-Oct-1951, 70 y.o.   MRN: [id number] Final CT read pending, I have reviewed and looks worse consistent with her clinical picture.  She does not need urgent surgery but this is failure to progress as I have told them.  There is nothing to drain or try to treat this conservatively now.  I told her she needs sigmoid colectomy, colostomy (she has been marked today).  We discussed surgery, risks as well today and I discussed one of my partners would do this.  I will post for tomorrow morning.

## 2021-03-19 NOTE — Consult Note (Signed)
[place] Nurse requested for preoperative stoma site marking  Discussed surgical procedure and stoma creation with patient and family.  Explained role of the [place] nurse team.  Provided the patient with educational booklet and provided samples of pouching options.  Answered patient questions, which were limited.   Examined patient lying, sitting, and standing in order to place the marking in the patient's visual field, away from any creases or abdominal contour issues and within the rectus muscle.  Attempted to mark below the patient's belt line.   Marked for colostomy in the LUQ  _6___ cm to the left of the umbilicus and __9__MM above the umbilicus; and, 6 cm to the right of the umbilicus and 2 cm above the umbilicus.  Patient's abdomen cleansed with CHG wipes at site markings, allowed to air dry prior to marking.Covered mark with thin film transparent dressing to preserve mark until date of surgery.   [place] Nurse team will follow up with patient after surgery for continue ostomy care and teaching.  Val Riles, RN, MSN, CWOCN, CNS-BC, pager [phone]

## 2021-03-20 ENCOUNTER — Inpatient Hospital Stay (HOSPITAL_COMMUNITY): Payer: Medicare Other | Admitting: Certified Registered Nurse Anesthetist

## 2021-03-20 ENCOUNTER — Encounter (HOSPITAL_COMMUNITY): Payer: Self-pay

## 2021-03-20 ENCOUNTER — Encounter (HOSPITAL_COMMUNITY): Admission: EM | Disposition: A | Discharge status: Home Health | Payer: Self-pay | Source: Home / Self Care

## 2021-03-20 HISTORY — PX: PARTIAL COLECTOMY: SHX5273

## 2021-03-20 HISTORY — PX: LAPAROTOMY: SHX154

## 2021-03-20 LAB — CBC
HCT: 30.5 % — ABNORMAL LOW (ref 36.0–46.0)
Hemoglobin: 10.1 g/dL — ABNORMAL LOW (ref 12.0–15.0)
MCH: 28.3 pg (ref 26.0–34.0)
MCHC: 33.1 g/dL (ref 30.0–36.0)
MCV: 85.4 fL (ref 80.0–100.0)
Platelets: 455 10*3/uL — ABNORMAL HIGH (ref 150–400)
RBC: 3.57 MIL/uL — ABNORMAL LOW (ref 3.87–5.11)
RDW: 14.8 % (ref 11.5–15.5)
WBC: 17.1 10*3/uL — ABNORMAL HIGH (ref 4.0–10.5)
nRBC: 0 % (ref 0.0–0.2)

## 2021-03-20 LAB — TYPE AND SCREEN
ABO/RH(D): O POS
Antibody Screen: NEGATIVE

## 2021-03-20 LAB — CREATININE, SERUM
Creatinine, Ser: 0.91 mg/dL (ref 0.44–1.00)
GFR, Estimated: >60 mL/min

## 2021-03-20 LAB — ABO/RH: ABO/RH(D): O POS

## 2021-03-20 SURGERY — LAPAROTOMY, EXPLORATORY
Anesthesia: General

## 2021-03-20 MED ORDER — FENTANYL CITRATE (PF) 100 MCG/2ML IJ SOLN
INTRAMUSCULAR | Status: AC
  Filled 2021-03-20: qty 2

## 2021-03-20 MED ORDER — MIDAZOLAM HCL 2 MG/2ML IJ SOLN
INTRAMUSCULAR | Status: AC
  Filled 2021-03-20: qty 2

## 2021-03-20 MED ORDER — LACTATED RINGERS IV SOLN: INTRAVENOUS | Status: DC

## 2021-03-20 MED ORDER — CHLORHEXIDINE GLUCONATE CLOTH 2 % EX PADS
6.0000 | MEDICATED_PAD | Freq: Every day | CUTANEOUS | Status: DC
  Administered 2021-03-20 – 2021-03-30 (×9): 6 via TOPICAL

## 2021-03-20 MED ORDER — ONDANSETRON HCL 4 MG/2ML IJ SOLN: 4.0000 mg | Freq: Once | INTRAMUSCULAR | Status: DC | PRN

## 2021-03-20 MED ORDER — KETAMINE HCL 50 MG/5ML IJ SOSY
PREFILLED_SYRINGE | INTRAMUSCULAR | Status: AC
  Filled 2021-03-20: qty 5

## 2021-03-20 MED ORDER — FENTANYL CITRATE (PF) 100 MCG/2ML IJ SOLN
INTRAMUSCULAR | Status: DC | PRN
  Administered 2021-03-20: 100 ug via INTRAVENOUS
  Administered 2021-03-20: 50 ug via INTRAVENOUS
  Administered 2021-03-20: 100 ug via INTRAVENOUS

## 2021-03-20 MED ORDER — LIDOCAINE 2% (20 MG/ML) 5 ML SYRINGE
INTRAMUSCULAR | Status: DC | PRN
  Administered 2021-03-20: 100 mg via INTRAVENOUS

## 2021-03-20 MED ORDER — ACETAMINOPHEN 10 MG/ML IV SOLN
INTRAVENOUS | Status: DC | PRN
  Administered 2021-03-20: 1000 mg via INTRAVENOUS

## 2021-03-20 MED ORDER — NALOXONE HCL 0.4 MG/ML IJ SOLN: 0.4000 mg | INTRAMUSCULAR | Status: DC | PRN

## 2021-03-20 MED ORDER — FENTANYL CITRATE (PF) 250 MCG/5ML IJ SOLN
INTRAMUSCULAR | Status: AC
  Filled 2021-03-20: qty 5

## 2021-03-20 MED ORDER — SUGAMMADEX SODIUM 200 MG/2ML IV SOLN
INTRAVENOUS | Status: DC | PRN
  Administered 2021-03-20: 400 mg via INTRAVENOUS

## 2021-03-20 MED ORDER — CHLORHEXIDINE GLUCONATE CLOTH 2 % EX PADS: 6.0000 | MEDICATED_PAD | Freq: Once | CUTANEOUS | Status: DC

## 2021-03-20 MED ORDER — MIDAZOLAM HCL 2 MG/2ML IJ SOLN
INTRAMUSCULAR | Status: DC | PRN
  Administered 2021-03-20 (×2): 1 mg via INTRAVENOUS

## 2021-03-20 MED ORDER — ROCURONIUM BROMIDE 10 MG/ML (PF) SYRINGE
PREFILLED_SYRINGE | INTRAVENOUS | Status: AC
  Filled 2021-03-20: qty 10

## 2021-03-20 MED ORDER — SODIUM CHLORIDE 0.9 % IV SOLN
2.0000 g | INTRAVENOUS | Status: AC
  Administered 2021-03-20: 2 g via INTRAVENOUS
  Filled 2021-03-20: qty 2

## 2021-03-20 MED ORDER — CHLORHEXIDINE GLUCONATE 0.12 % MT SOLN: 15.0000 mL | Freq: Once | OROMUCOSAL | Status: AC

## 2021-03-20 MED ORDER — DEXAMETHASONE SODIUM PHOSPHATE 10 MG/ML IJ SOLN
INTRAMUSCULAR | Status: AC
  Filled 2021-03-20: qty 1

## 2021-03-20 MED ORDER — LIDOCAINE 2% (20 MG/ML) 5 ML SYRINGE
INTRAMUSCULAR | Status: AC
  Filled 2021-03-20: qty 5

## 2021-03-20 MED ORDER — HYDROMORPHONE 1 MG/ML IV SOLN
INTRAVENOUS | Status: AC
  Filled 2021-03-20: qty 30

## 2021-03-20 MED ORDER — SODIUM CHLORIDE 0.9% FLUSH: 9.0000 mL | INTRAVENOUS | Status: DC | PRN

## 2021-03-20 MED ORDER — FENTANYL CITRATE (PF) 100 MCG/2ML IJ SOLN
25.0000 ug | INTRAMUSCULAR | Status: DC | PRN
  Administered 2021-03-20: 50 ug via INTRAVENOUS

## 2021-03-20 MED ORDER — SUCCINYLCHOLINE CHLORIDE 200 MG/10ML IV SOSY
PREFILLED_SYRINGE | INTRAVENOUS | Status: DC | PRN
  Administered 2021-03-20: 100 mg via INTRAVENOUS

## 2021-03-20 MED ORDER — ONDANSETRON HCL 4 MG/2ML IJ SOLN
INTRAMUSCULAR | Status: DC | PRN
  Administered 2021-03-20: 4 mg via INTRAVENOUS

## 2021-03-20 MED ORDER — 0.9 % SODIUM CHLORIDE (POUR BTL) OPTIME
TOPICAL | Status: DC | PRN
  Administered 2021-03-20: 1000 mL
  Administered 2021-03-20: 2000 mL

## 2021-03-20 MED ORDER — ACETAMINOPHEN 10 MG/ML IV SOLN
INTRAVENOUS | Status: AC
  Filled 2021-03-20: qty 100

## 2021-03-20 MED ORDER — ROCURONIUM BROMIDE 10 MG/ML (PF) SYRINGE
PREFILLED_SYRINGE | INTRAVENOUS | Status: DC | PRN
  Administered 2021-03-20 (×2): 50 mg via INTRAVENOUS

## 2021-03-20 MED ORDER — HYDROMORPHONE 1 MG/ML IV SOLN
INTRAVENOUS | Status: DC
  Administered 2021-03-20: 2.4 mg via INTRAVENOUS
  Administered 2021-03-20: 2.2 mg via INTRAVENOUS
  Administered 2021-03-20: 30 mg via INTRAVENOUS
  Administered 2021-03-21: 2.8 mg via INTRAVENOUS
  Administered 2021-03-21: 1.8 mg via INTRAVENOUS
  Administered 2021-03-21: 1.2 mg via INTRAVENOUS
  Administered 2021-03-21: 0.2 mg via INTRAVENOUS
  Administered 2021-03-21: 1.6 mg via INTRAVENOUS
  Administered 2021-03-21: 2.4 mg via INTRAVENOUS
  Administered 2021-03-22: 0.6 mg via INTRAVENOUS

## 2021-03-20 MED ORDER — METHOCARBAMOL 1000 MG/10ML IJ SOLN
1000.0000 mg | Freq: Three times a day (TID) | INTRAVENOUS | Status: DC | PRN
  Administered 2021-03-23: 1000 mg via INTRAVENOUS
  Filled 2021-03-20 (×2): qty 10

## 2021-03-20 MED ORDER — CHLORHEXIDINE GLUCONATE 0.12 % MT SOLN
OROMUCOSAL | Status: AC
  Administered 2021-03-20: 15 mL via OROMUCOSAL
  Filled 2021-03-20: qty 15

## 2021-03-20 MED ORDER — PROPOFOL 10 MG/ML IV BOLUS
INTRAVENOUS | Status: DC | PRN
  Administered 2021-03-20: 100 mg via INTRAVENOUS

## 2021-03-20 MED ORDER — ONDANSETRON HCL 4 MG/2ML IJ SOLN
INTRAMUSCULAR | Status: AC
  Filled 2021-03-20: qty 2

## 2021-03-20 MED ORDER — PHENOL 1.4 % MT LIQD: 1.0000 | OROMUCOSAL | Status: DC | PRN

## 2021-03-20 MED ORDER — KETAMINE HCL 10 MG/ML IJ SOLN
INTRAMUSCULAR | Status: DC | PRN
  Administered 2021-03-20: 40 mg via INTRAVENOUS
  Administered 2021-03-20: 10 mg via INTRAVENOUS

## 2021-03-20 SURGICAL SUPPLY — 59 items
APL PRP STRL LF DISP 70% ISPRP (MISCELLANEOUS) ×1
BLADE CLIPPER SURG (BLADE) IMPLANT
BNDG GAUZE ELAST 4 BULKY (GAUZE/BANDAGES/DRESSINGS) ×2 IMPLANT
CANISTER SUCT 3000ML PPV (MISCELLANEOUS) ×3 IMPLANT
CHLORAPREP W/TINT 26 (MISCELLANEOUS) ×3 IMPLANT
COVER SURGICAL LIGHT HANDLE (MISCELLANEOUS) ×3 IMPLANT
COVER WAND RF STERILE (DRAPES) ×3 IMPLANT
DRAPE LAPAROSCOPIC ABDOMINAL (DRAPES) ×3 IMPLANT
DRAPE WARM FLUID 44X44 (DRAPES) ×3 IMPLANT
DRSG OPSITE POSTOP 4X10 (GAUZE/BANDAGES/DRESSINGS) IMPLANT
DRSG OPSITE POSTOP 4X8 (GAUZE/BANDAGES/DRESSINGS) IMPLANT
DRSG PAD ABDOMINAL 8X10 ST (GAUZE/BANDAGES/DRESSINGS) ×2 IMPLANT
ELECT BLADE 6.5 EXT (BLADE) ×2 IMPLANT
ELECT CAUTERY BLADE 6.4 (BLADE) ×3 IMPLANT
ELECT REM PT RETURN 9FT ADLT (ELECTROSURGICAL) ×3
ELECTRODE REM PT RTRN 9FT ADLT (ELECTROSURGICAL) ×1 IMPLANT
GAUZE SPONGE 4X4 12PLY STRL (GAUZE/BANDAGES/DRESSINGS) ×2 IMPLANT
GLOVE BIO SURGEON STRL SZ 6 (GLOVE) ×3 IMPLANT
GLOVE SRG 8 PF TXTR STRL LF DI (GLOVE) IMPLANT
GLOVE SS BIOGEL STRL SZ 7.5 (GLOVE) IMPLANT
GLOVE SUPERSENSE BIOGEL SZ 7.5 (GLOVE) ×2
GLOVE SURG UNDER LTX SZ6.5 (GLOVE) ×3 IMPLANT
GLOVE SURG UNDER POLY LF SZ7.5 (GLOVE) ×2 IMPLANT
GLOVE SURG UNDER POLY LF SZ8 (GLOVE) ×3
GOWN STRL REUS W/ TWL LRG LVL3 (GOWN DISPOSABLE) ×2 IMPLANT
GOWN STRL REUS W/ TWL XL LVL3 (GOWN DISPOSABLE) IMPLANT
GOWN STRL REUS W/TWL LRG LVL3 (GOWN DISPOSABLE) ×6
GOWN STRL REUS W/TWL XL LVL3 (GOWN DISPOSABLE) ×3
HANDLE SUCTION POOLE (INSTRUMENTS) ×1 IMPLANT
KIT BASIN OR (CUSTOM PROCEDURE TRAY) ×3 IMPLANT
KIT COLOSTOMY ILEOSTOMY 4 (WOUND CARE) ×2 IMPLANT
KIT TURNOVER KIT B (KITS) ×3 IMPLANT
LIGASURE IMPACT 36 18CM CVD LR (INSTRUMENTS) IMPLANT
NS IRRIG 1000ML POUR BTL (IV SOLUTION) ×6 IMPLANT
PACK GENERAL/GYN (CUSTOM PROCEDURE TRAY) ×3 IMPLANT
PAD ABD 8X10 STRL (GAUZE/BANDAGES/DRESSINGS) ×2 IMPLANT
PAD ARMBOARD 7.5X6 YLW CONV (MISCELLANEOUS) ×3 IMPLANT
PENCIL SMOKE EVACUATOR (MISCELLANEOUS) ×3 IMPLANT
RELOAD PROXIMATE 75MM BLUE (ENDOMECHANICALS) ×15 IMPLANT
RELOAD STAPLE 75 3.8 BLU REG (ENDOMECHANICALS) IMPLANT
SEALER TISSUE X1 CVD JAW (INSTRUMENTS) ×2 IMPLANT
SPECIMEN JAR LARGE (MISCELLANEOUS) IMPLANT
SPONGE LAP 18X18 RF (DISPOSABLE) ×12 IMPLANT
STAPLER CUT RELOAD GREEN (STAPLE) ×2 IMPLANT
STAPLER CVD CUT GN 40 RELOAD (ENDOMECHANICALS) ×3 IMPLANT
STAPLER CVD CUT GRN 40 RELOAD (ENDOMECHANICALS) IMPLANT
STAPLER PROXIMATE 75MM BLUE (STAPLE) ×2 IMPLANT
STAPLER VISISTAT 35W (STAPLE) ×3 IMPLANT
SUCTION POOLE HANDLE (INSTRUMENTS) ×3
SUT PDS AB 1 TP1 96 (SUTURE) ×6 IMPLANT
SUT PROLENE 2 0 CT2 30 (SUTURE) ×2 IMPLANT
SUT SILK 2 0 SH CR/8 (SUTURE) ×3 IMPLANT
SUT SILK 2 0 TIES 10X30 (SUTURE) ×3 IMPLANT
SUT SILK 3 0 SH CR/8 (SUTURE) ×3 IMPLANT
SUT SILK 3 0 TIES 10X30 (SUTURE) ×3 IMPLANT
SUT VIC AB 3-0 SH 18 (SUTURE) ×2 IMPLANT
SUT VIC AB 3-0 SH 8-18 (SUTURE) ×4 IMPLANT
TOWEL GREEN STERILE (TOWEL DISPOSABLE) ×3 IMPLANT
TRAY FOLEY MTR SLVR 16FR STAT (SET/KITS/TRAYS/PACK) IMPLANT

## 2021-03-20 NOTE — Op Note (Signed)
Operative Note  Sarajean Dessert  [id number]  [id number]  03/20/2021   Surgeon: Waynard Reeds  Assistant: Greer Pickerel MD  Procedure performed: Exploratory laparotomy, lysis of adhesions x60 minutes, sigmoid colectomy, mobilization of splenic flexure, creation of end colostomy, ileocecectomy with ileocolonic anastomosis  Procedure status: Emergency Infection present at time of surgery: yes, perforated diverticulitis with phlegmon and pus in the abdominal cavity  Preop diagnosis: Perforated diverticulitis status post failed course of medical treatment Post-op diagnosis/intraop findings: Same, abscess rind and inflammatory adhesions throughout the abdomen, extensive adhesions of small bowel in the pelvis from previous gynecologic surgery  Specimens: Sigmoid colon, ileocecectomy Retained items: None EBL: 08MV Complications: none  Description of procedure: After obtaining informed consent the patient was taken to the operating room and placed supine on operating room table wheregeneral endotracheal anesthesia was initiated, preoperative antibiotics were administered, SCDs applied, and a formal timeout was performed.  A nasogastric tube was placed, as well as a Foley catheter.  The abdomen was prepped and draped in the usual sterile fashion.  A midline laparotomy was created and peritoneal entry was achieved without any injury to the underlying viscera.  A moderate amount of free air was evacuated. Upon entering the abdomen (organ space), I encountered a phlegmon involving the pelvis surrounding the sigmoid colon as well as interloop phlegmonous changes between loops of small bowel.  The transverse colon is extremely redundant and dilated.  Adhesions to the anterior abdominal wall were taken down bluntly and with sharp dissection until the incision was able to be completed.  The Bookwalter retractor was then placed and we proceeded to lyse interloop inflammatory adhesions as well as  adhesions of the small bowel in the pelvis from her previous gynecologic surgery.  This was quite a tedious process but ultimately we were able to deliver the small bowel out of the pelvis.  An expected serosal tear was oversewn with interrupted 3-0 Vicryl seromuscular sutures.  There was also a approximately 1 cm expected enterotomy in the distal small bowel about 5 cm from the ileocecal valve.  This was temporarily oversewn while we continue to work towards the diseased sigmoid.  Once the sigmoid colon was identified, the lateral attachments were divided with cautery and the sigmoid mobilized medially.  This progressed down to the level of the rectum, where healthy segment of bowel was identified.  A window was made posterior to the rectum and the mesentery and this was transected with a contour green load stapler.  Bleeding on the left side of the staple line was controlled with a 3-0 Vicryl suture.  A 0 Prolene was placed on the right corner of the staple line with a long tail for future identification.  We continued mobilizing the lateral attachments, proceeding around the splenic flexure to mobilize this to ensure sufficient reach for the future colostomy.  The sigmoid was transected from the descending colon and an area that appeared healthy.  The intervening mesentery was divided close to the bowel using the Enseal device.  Bleeding points in the pelvis were addressed with 3-0 Vicryl suture ligature.  The marked colostomy site which had been created by the wound ostomy nurse was then created in the standard fashion and the stapled end of the descending colon brought out through this ensuring no twist in the colon or the mesentery.  This was found to have sufficient reach.  This was secured with a Babcock to be matured at the end of the case.  The small bowel was  then run twice and confirmed to be free of any other injuries.  The enterotomy I felt was too close to the ileocecal valve to perform a small  bowel anastomosis and therefore we proceeded to perform an ileocecectomy with ileocolonic anastomosis, resecting the enterotomy, as well as some fairly bruised sections of small bowel with small serosal tears.  An ileocolonic anastomosis was created using the blue load linear cutting stapler.  The common enterotomy was closed with another firing of the 75 mm GIA.  The corners of the staple line were imbricated with 3-0 Vicryls and 3-0 Vicryl seromuscular suture was placed at the apex of the staple line.  On completion this anastomosis appears tension-free, well perfused and viable and is widely patent.  This is returned to the abdominal cavity along with the remainder of the small bowel.  The pelvis and lower abdomen were irrigated with 2 L of warm sterile saline and the effluent was clear.  The omentum was brought back down over the small bowel and tucked into the pelvis.  The NG tube is palpated in the stomach in appropriate position and this was secured.  The fascia was then closed with looped #1 PDS starting at either end and tying centrally.  The wound was then packed in a damp to dry fashion.  The ostomy was matured in a standard Brooke fashion using interrupted 3-0 Vicryl's.  An ostomy appliance was placed after ensuring by digitization that the stoma is patent down through the fascia. The patient was then awakened, extubated and taken to PACU in stable condition.   All counts were correct at the completion of the case.

## 2021-03-20 NOTE — Anesthesia Procedure Notes (Signed)
Procedure Name: Intubation Date/Time: 03/20/2021 9:39 AM Performed by: Georgia Duff, CRNA Pre-anesthesia Checklist: Patient identified, Emergency Drugs available, Suction available and Patient being monitored Patient Re-evaluated:Patient Re-evaluated prior to induction Oxygen Delivery Method: Circle System Utilized Preoxygenation: Pre-oxygenation with 100% oxygen Induction Type: IV induction Ventilation: Mask ventilation without difficulty Laryngoscope Size: Miller and 2 Grade View: Grade I Tube type: Oral Tube size: 7.0 mm Number of attempts: 1 Airway Equipment and Method: Stylet and Oral airway Placement Confirmation: ETT inserted through vocal cords under direct vision,  positive ETCO2 and breath sounds checked- equal and bilateral Secured at: 21 cm Tube secured with: Tape Dental Injury: Teeth and Oropharynx as per pre-operative assessment

## 2021-03-20 NOTE — Anesthesia Preprocedure Evaluation (Addendum)
Anesthesia Evaluation  Patient identified by MRN, date of birth, ID band Patient awake    Reviewed: Allergy & Precautions, NPO status , Patient's Chart, lab work & pertinent test results  Airway Mallampati: II  TM Distance: >3 FB Neck ROM: Full    Dental  (+) Teeth Intact, Dental Advisory Given, Loose,    Pulmonary neg pulmonary ROS,    Pulmonary exam normal breath sounds clear to auscultation       Cardiovascular hypertension, Pt. on medications Normal cardiovascular exam Rhythm:Regular Rate:Normal     Neuro/Psych  Neuromuscular disease    GI/Hepatic Neg liver ROS, GERD  Medicated,PERFORATED DIVERTICULITIS   Endo/Other  negative endocrine ROSObesity   Renal/GU negative Renal ROS     Musculoskeletal negative musculoskeletal ROS (+)   Abdominal   Peds  Hematology  (+) Blood dyscrasia, anemia ,   Anesthesia Other Findings Day of surgery medications reviewed with the patient.  Reproductive/Obstetrics                            Anesthesia Physical Anesthesia Plan  ASA: II  Anesthesia Plan: General   Post-op Pain Management:    Induction: Intravenous  PONV Risk Score and Plan:   Airway Management Planned: Oral ETT  Additional Equipment:   Intra-op Plan:   Post-operative Plan: Extubation in OR  Informed Consent: I have reviewed the patients History and Physical, chart, labs and discussed the procedure including the risks, benefits and alternatives for the proposed anesthesia with the patient or authorized representative who has indicated his/her understanding and acceptance.     Dental advisory given  Plan Discussed with: CRNA  Anesthesia Plan Comments: (Risks/benefits of general anesthesia discussed with patient including risk of damage to teeth, lips, gum, and tongue, nausea/vomiting, allergic reactions to medications, and the possibility of heart attack, stroke and death.   All patient questions answered.  Patient wishes to proceed.)        Anesthesia Quick Evaluation

## 2021-03-20 NOTE — Anesthesia Postprocedure Evaluation (Signed)
Anesthesia Post Note  Patient: Regina Roberts  Procedure(s) Performed: LAPAROTOMY (N/A ) PARTIAL COLECTOMY WITH COLOSTOMY (N/A )     Patient location during evaluation: PACU Anesthesia Type: General Level of consciousness: awake and alert, awake and oriented Pain management: pain level controlled Vital Signs Assessment: post-procedure vital signs reviewed and stable Respiratory status: spontaneous breathing, nonlabored ventilation, respiratory function stable and patient connected to nasal cannula oxygen Cardiovascular status: blood pressure returned to baseline and stable Postop Assessment: no apparent nausea or vomiting Anesthetic complications: no   No complications documented.  Last Vitals:  Vitals:   03/20/21 1318 03/20/21 1345  BP: (!) 151/74 (!) 144/71  Pulse: 88 89  Resp: 19 18  Temp: 36.8 C 36.8 C  SpO2: 96% 91%    Last Pain:  Vitals:   03/20/21 1345  TempSrc: Oral  PainSc:                  [name]

## 2021-03-20 NOTE — Transfer of Care (Signed)
Immediate Anesthesia Transfer of Care Note  Patient: Regina Roberts  Procedure(s) Performed: LAPAROTOMY (N/A ) PARTIAL COLECTOMY WITH COLOSTOMY (N/A )  Patient Location: PACU  Anesthesia Type:General  Level of Consciousness: drowsy and patient cooperative  Airway & Oxygen Therapy: Patient Spontanous Breathing and Patient connected to face mask oxygen  Post-op Assessment: Report given to RN  Post vital signs: Reviewed and stable  Last Vitals:  Vitals Value Taken Time  BP 152/74 03/20/21 1228  Temp    Pulse 86 03/20/21 1230  Resp 25 03/20/21 1230  SpO2 99 % 03/20/21 1230  Vitals shown include unvalidated device data.  Last Pain:  Vitals:   03/20/21 0913  TempSrc: Oral  PainSc: 0-No pain      Patients Stated Pain Goal: 2 (35/70/17 7939)  Complications: No complications documented.

## 2021-03-20 NOTE — Progress Notes (Signed)
Progress Note  Day of Surgery  Subjective: Calm and fairly comfortable this morning rating pain around 4. Worsened whenever she gets up to go to the bathroom.  Objective: Vital signs in last 24 hours: Temp:  [98.8 F (37.1 C)-99.7 F (37.6 C)] 99.3 F (37.4 C) (05/28 0620) Pulse Rate:  [83-93] 83 (05/28 0620) Resp:  [18-19] 18 (05/28 0620) BP: (149-165)/(80-88) 149/80 (05/28 0620) SpO2:  [95 %-98 %] 98 % (05/28 0620) Last BM Date: 03/12/21  Intake/Output from previous day: 05/27 0701 - 05/28 0700 In: 3249.8 [I.V.:903.5; IV Piggyback:2346.3] Out: 1550 [Urine:1550] Intake/Output this shift: No intake/output data recorded.  PE: General: pleasant, WD,obesefemale who is laying in bed in NAD Heart: regular, rate, and rhythm. Lungs: Respiratory effort nonlabored Abd: soft,mild generalized ttp moreso in upper abdomen, without peritonitis, distended RD:EYCXK of LUEimproving Skin: warm and dry with no masses, lesions, or rashes Psych: A&Ox3 with an appropriate affect.   Lab Results:  Recent Labs    03/19/21 0137 03/20/21 0100  WBC 17.9* 17.1*  HGB 10.0* 10.1*  HCT 30.0* 30.5*  PLT 424* 455*   BMET Recent Labs    03/18/21 0055 03/19/21 0137 03/20/21 0456  NA 136 137  --   K 3.6 4.2  --   CL 105 103  --   CO2 23 28  --   GLUCOSE 116* 128*  --   BUN 6* 7*  --   CREATININE 0.96 0.95 0.91  CALCIUM 8.3* 8.8*  --    PT/INR No results for input(s): LABPROT, INR in the last 72 hours. CMP     Component Value Date/Time   NA 137 03/19/2021 0137   K 4.2 03/19/2021 0137   CL 103 03/19/2021 0137   CO2 28 03/19/2021 0137   GLUCOSE 128 (H) 03/19/2021 0137   BUN 7 (L) 03/19/2021 0137   CREATININE 0.91 03/20/2021 0456   CALCIUM 8.8 (L) 03/19/2021 0137   PROT 7.5 03/12/2021 1959   ALBUMIN 3.8 03/12/2021 1959   AST 17 03/12/2021 1959   ALT 18 03/12/2021 1959   ALKPHOS 58 03/12/2021 1959   BILITOT 1.4 (H) 03/12/2021 1959   GFRNONAA >60 03/20/2021 0456    Lipase     Component Value Date/Time   LIPASE 21 03/12/2021 1959       Studies/Results: CT ABDOMEN PELVIS WO CONTRAST  Result Date: 03/19/2021 CLINICAL DATA:  Diverticulitis. EXAM: CT ABDOMEN AND PELVIS WITHOUT CONTRAST TECHNIQUE: Multidetector CT imaging of the abdomen and pelvis was performed following the standard protocol without IV contrast. COMPARISON:  Mar 16, 2021. FINDINGS: Lower chest: No acute abnormality. Hepatobiliary: No focal liver abnormality is seen. No gallstones, gallbladder wall thickening, or biliary dilatation. Pancreas: Unremarkable. No pancreatic ductal dilatation or surrounding inflammatory changes. Spleen: Normal in size without focal abnormality. Adrenals/Urinary Tract: Adrenal glands appear normal. Stable right renal cyst is noted. No definite hydronephrosis or renal obstruction is noted. Urinary bladder is decompressed. Stomach/Bowel: The stomach is unremarkable. There is no evidence of bowel obstruction. Continued findings consistent with sigmoid diverticulitis. There is now noted 3.8 x 2.8 cm air-fluid collection superior to the inflamed colon consistent with para diverticular abscess. Another air-fluid collection measuring 4.5 x 2.5 cm is seen immediately superior to previously described abscess. Vascular/Lymphatic: Aortic atherosclerosis. No enlarged abdominal or pelvic lymph nodes. Reproductive: Status post hysterectomy. No adnexal masses. Other: Continued presence of pneumoperitoneum consistent with perforation secondary to sigmoid diverticulitis. Musculoskeletal: No acute or significant osseous findings. IMPRESSION: Continued presence of sigmoid diverticulitis with pneumoperitoneum  consistent with perforation of the diverticulitis. There is interval development of 2 adjacent paradiverticular abscesses, the largest measuring 4.5 x 2.5 cm. These results will be called to the ordering clinician or representative by the Radiologist Assistant, and communication  documented in the PACS or zVision Dashboard. Aortic Atherosclerosis (ICD10-I70.0). Electronically Signed   By: Marijo Conception M.D.   On: 03/19/2021 16:38    Anti-infectives: Anti-infectives (From admission, onward)   Start     Dose/Rate Route Frequency Ordered Stop   03/20/21 0715  cefoTEtan (CEFOTAN) 2 g in sodium chloride 0.9 % 100 mL IVPB        2 g 200 mL/hr over 30 Minutes Intravenous On call to O.R. 03/20/21 0629 03/21/21 0559   03/19/21 1400  ertapenem (INVANZ) 1,000 mg in sodium chloride 0.9 % 100 mL IVPB        1 g 200 mL/hr over 30 Minutes Intravenous Every 24 hours 03/19/21 1224     03/18/21 1200  amoxicillin-clavulanate (AUGMENTIN) 875-125 MG per tablet 1 tablet  Status:  Discontinued        1 tablet Oral Every 12 hours 03/18/21 0759 03/19/21 1224   03/13/21 1400  piperacillin-tazobactam (ZOSYN) IVPB 3.375 g  Status:  Discontinued        3.375 g 12.5 mL/hr over 240 Minutes Intravenous Every 8 hours 03/13/21 0757 03/18/21 0759   03/13/21 0615  piperacillin-tazobactam (ZOSYN) IVPB 3.375 g        3.375 g 12.5 mL/hr over 240 Minutes Intravenous  Once 03/13/21 0604 03/13/21 1247       Assessment/Plan HTN HLD GERD LUE edema -improving, [place] 5/24 negative for SVT or DVT  Perforatedsigmoiddiverticulitis  -CT with perforated sigmoid diverticulitis with small to moderate pneumoperitoneumwithout abscess -repeat CT 5/24 stable, CT 5/27 with some progression and two new paracolonic air-fluid collections - WBC increased today to 17 - She has not improved despite a week of maximal medical therapy. I recommend we go ahead and proceed with sigmoid colectomy/colostomy this morning as previously noted. Discussed surgical plan, risks of bleeding, infection, pain, scarring, injury to intraabdominal or retroperitoneal structures, ileus/obstruction, wound healing problems, hernia, prolonged hospitalization/recovery, as well as general cardiovascular/pulmonary/thromboembolic risks.  Questions welcomed and answered. She agrees to proceed.   FEN: NPO for surgery VTE: lovenox ID: Zosyn 5/21>5/26; PO Augmentin 5/26>>5/27, Ertapenem 5/27->   LOS: 7 days    Clovis Riley, MD [facility] 03/20/2021, 8:02 AM Please see Amion for pager number during day hours 7:00am-4:30pm

## 2021-03-21 ENCOUNTER — Inpatient Hospital Stay: Payer: Self-pay

## 2021-03-21 ENCOUNTER — Encounter (HOSPITAL_COMMUNITY): Payer: Self-pay | Admitting: Surgery

## 2021-03-21 MED ORDER — SODIUM CHLORIDE 0.9 % IV SOLN: INTRAVENOUS | Status: DC

## 2021-03-21 MED ORDER — FAT EMUL FISH OIL/PLANT BASED 20% (SMOFLIPID)IV EMUL
INTRAVENOUS | Status: AC
  Filled 2021-03-21: qty 480

## 2021-03-21 MED ORDER — SODIUM CHLORIDE 0.9% FLUSH
10.0000 mL | INTRAVENOUS | Status: DC | PRN
  Administered 2021-03-23 – 2021-03-24 (×2): 10 mL

## 2021-03-21 NOTE — Consult Note (Signed)
[place] Nurse ostomy consult note Consult received for patient with new end colostomy.  [place] Nursing team will see on Monday, 03/22/21.  [place] nursing team will follow, and will remain available to this patient, the nursing, surgical and medical teams.   Thanks, Maudie Flakes, MSN, RN, [place], Arther Abbott  Pager# [phone]

## 2021-03-21 NOTE — Progress Notes (Signed)
RN aware the PICC is ready to use and to remove PIV's per policy.

## 2021-03-21 NOTE — Progress Notes (Signed)
Peripherally Inserted Central Catheter Placement  The IV Nurse has discussed with the patient and/or persons authorized to consent for the patient, the purpose of this procedure and the potential benefits and risks involved with this procedure.  The benefits include less needle sticks, lab draws from the catheter, and the patient may be discharged home with the catheter. Risks include, but not limited to, infection, bleeding, blood clot (thrombus formation), and puncture of an artery; nerve damage and irregular heartbeat and possibility to perform a PICC exchange if needed/ordered by physician.  Alternatives to this procedure were also discussed.  B[facility] Power PICC patient education guide, fact sheet on infection prevention and patient information card has been provided to patient /or left at bedside.    PICC Placement Documentation  PICC Double Lumen 03/21/21 PICC Right Brachial 37 cm 0 cm (Active)  Indication for Insertion or Continuance of Line Administration of hyperosmolar/irritating solutions (i.e. TPN, Vancomycin, etc.) 03/21/21 1421  Exposed Catheter (cm) 0 cm 03/21/21 1421  Site Assessment Clean;Dry;Intact 03/21/21 1421  Lumen #1 Status Flushed;Saline locked;Blood return noted 03/21/21 1421  Lumen #2 Status Flushed;Saline locked;Blood return noted 03/21/21 1421  Dressing Type Transparent 03/21/21 1421  Dressing Status Clean;Dry;Intact 03/21/21 1421  Antimicrobial disc in place? Yes 03/21/21 1421  Safety Lock Not Applicable 00/37/94 4461  Line Care Connections checked and tightened 03/21/21 1421  Line Adjustment (NICU/IV Team Only) No 03/21/21 1421  Dressing Intervention New dressing 03/21/21 1421  Dressing Change Due 03/28/21 03/21/21 1421       Rolena Infante 03/21/2021, 2:23 PM

## 2021-03-21 NOTE — Progress Notes (Signed)
Progress Note  1 Day Post-Op  Subjective: Pain ok Woke up frequently  Started working on IS this am - IS about 300  Objective: Vital signs in last 24 hours: Temp:  [97.4 F (36.3 C)-99.6 F (37.6 C)] 99.6 F (37.6 C) (05/29 0546) Pulse Rate:  [84-103] 103 (05/29 0546) Resp:  [16-23] 16 (05/29 0803) BP: (139-177)/(71-90) 146/75 (05/29 0546) SpO2:  [91 %-99 %] 96 % (05/29 0803) Last BM Date: 03/12/21  Intake/Output from previous day: 05/28 0701 - 05/29 0700 In: 3316.3 [I.V.:3116.3; IV Piggyback:200] Out: 1250 [Urine:1250] Intake/Output this shift: No intake/output data recorded.  PE: General: pleasant, WD,obesefemale who is laying in bed in NAD Heart: regular, rate, and rhythm. Lungs: Respiratory effort nonlabored Abd: soft, distended, dressing intact. Ostomy viable - a little dark HQ:IONGE of LUEimproving Skin: warm and dry with no masses, lesions, or rashes Psych: A&Ox3 with an appropriate affect.   Lab Results:  Recent Labs    03/19/21 0137 03/20/21 0100  WBC 17.9* 17.1*  HGB 10.0* 10.1*  HCT 30.0* 30.5*  PLT 424* 455*   BMET Recent Labs    03/19/21 0137 03/20/21 0456  NA 137  --   K 4.2  --   CL 103  --   CO2 28  --   GLUCOSE 128*  --   BUN 7*  --   CREATININE 0.95 0.91  CALCIUM 8.8*  --    PT/INR No results for input(s): LABPROT, INR in the last 72 hours. CMP     Component Value Date/Time   NA 137 03/19/2021 0137   K 4.2 03/19/2021 0137   CL 103 03/19/2021 0137   CO2 28 03/19/2021 0137   GLUCOSE 128 (H) 03/19/2021 0137   BUN 7 (L) 03/19/2021 0137   CREATININE 0.91 03/20/2021 0456   CALCIUM 8.8 (L) 03/19/2021 0137   PROT 7.5 03/12/2021 1959   ALBUMIN 3.8 03/12/2021 1959   AST 17 03/12/2021 1959   ALT 18 03/12/2021 1959   ALKPHOS 58 03/12/2021 1959   BILITOT 1.4 (H) 03/12/2021 1959   GFRNONAA >60 03/20/2021 0456   Lipase     Component Value Date/Time   LIPASE 21 03/12/2021 1959       Studies/Results: CT ABDOMEN  PELVIS WO CONTRAST  Result Date: 03/19/2021 CLINICAL DATA:  Diverticulitis. EXAM: CT ABDOMEN AND PELVIS WITHOUT CONTRAST TECHNIQUE: Multidetector CT imaging of the abdomen and pelvis was performed following the standard protocol without IV contrast. COMPARISON:  Mar 16, 2021. FINDINGS: Lower chest: No acute abnormality. Hepatobiliary: No focal liver abnormality is seen. No gallstones, gallbladder wall thickening, or biliary dilatation. Pancreas: Unremarkable. No pancreatic ductal dilatation or surrounding inflammatory changes. Spleen: Normal in size without focal abnormality. Adrenals/Urinary Tract: Adrenal glands appear normal. Stable right renal cyst is noted. No definite hydronephrosis or renal obstruction is noted. Urinary bladder is decompressed. Stomach/Bowel: The stomach is unremarkable. There is no evidence of bowel obstruction. Continued findings consistent with sigmoid diverticulitis. There is now noted 3.8 x 2.8 cm air-fluid collection superior to the inflamed colon consistent with para diverticular abscess. Another air-fluid collection measuring 4.5 x 2.5 cm is seen immediately superior to previously described abscess. Vascular/Lymphatic: Aortic atherosclerosis. No enlarged abdominal or pelvic lymph nodes. Reproductive: Status post hysterectomy. No adnexal masses. Other: Continued presence of pneumoperitoneum consistent with perforation secondary to sigmoid diverticulitis. Musculoskeletal: No acute or significant osseous findings. IMPRESSION: Continued presence of sigmoid diverticulitis with pneumoperitoneum consistent with perforation of the diverticulitis. There is interval development of 2 adjacent paradiverticular  abscesses, the largest measuring 4.5 x 2.5 cm. These results will be called to the ordering clinician or representative by the Radiologist Assistant, and communication documented in the PACS or zVision Dashboard. Aortic Atherosclerosis (ICD10-I70.0). Electronically Signed   By: Marijo Conception M.D.   On: 03/19/2021 16:38   [place] EKG SITE RITE  Result Date: 03/21/2021 If Site Rite image not attached, placement could not be confirmed due to current cardiac rhythm.   Anti-infectives: Anti-infectives (From admission, onward)   Start     Dose/Rate Route Frequency Ordered Stop   03/20/21 0715  cefoTEtan (CEFOTAN) 2 g in sodium chloride 0.9 % 100 mL IVPB        2 g 200 mL/hr over 30 Minutes Intravenous On call to O.R. 03/20/21 1324 03/20/21 0951   03/19/21 1400  ertapenem (INVANZ) 1,000 mg in sodium chloride 0.9 % 100 mL IVPB        1 g 200 mL/hr over 30 Minutes Intravenous Every 24 hours 03/19/21 1224     03/18/21 1200  amoxicillin-clavulanate (AUGMENTIN) 875-125 MG per tablet 1 tablet  Status:  Discontinued        1 tablet Oral Every 12 hours 03/18/21 0759 03/19/21 1224   03/13/21 1400  piperacillin-tazobactam (ZOSYN) IVPB 3.375 g  Status:  Discontinued        3.375 g 12.5 mL/hr over 240 Minutes Intravenous Every 8 hours 03/13/21 0757 03/18/21 0759   03/13/21 0615  piperacillin-tazobactam (ZOSYN) IVPB 3.375 g        3.375 g 12.5 mL/hr over 240 Minutes Intravenous  Once 03/13/21 0604 03/13/21 1247       Assessment/Plan HTN HLD GERD LUE edema -improving, [place] 5/24 negative for SVT or DVT  Perforatedsigmoiddiverticulitis  -s/p exp lap, ileocecectomy, repair SB serosal tears, LOA, colectomy/end colostomy, 5/28 Dr Kae Heller -expect ileus -woc consult for ostomy -w-d dressing bid Pulm: pulm toilet, IS FEN: NPO for surgery, place PICC/start TPN VTE: lovenox ID: Zosyn 5/21>5/26; PO Augmentin 5/26>>5/27, Ertapenem 5/27->cont for 4 days given intra-abd contamination Protein calorie malnutrition - prealbumin 7.8 on 5/25-    LOS: 8 days    Greer Pickerel, MD [facility] 03/21/2021, 9:03 AM Please see Amion for pager number during day hours 7:00am-4:30pm

## 2021-03-21 NOTE — Progress Notes (Signed)
PHARMACY - TOTAL PARENTERAL NUTRITION CONSULT NOTE   Indication: Prolonged ileus  Patient Measurements:     There is no height or weight on file to calculate BMI. Usual Weight: 84.8 kg  Assessment:  PMH: HTN, HLD, GERD, anemia,   Perforated diverticulitis with phlegmon and pus in the abdominal cavity (failed non-surgical measures and abx).  s/p OR 5/28 for Exlap with LOA colectomy/colostomy. Plan TPN 5/29 in anticipation of prolonged ileus.  Glucose / Insulin: no h/o DM Electrolytes: WNL Renal: Scr 0.95 Hepatic: WNL (5/20), Tbili 1.4. Prealbumin 7.8 (5/25) Intake / Output; MIVF: +10.2L total. UOP 1247ml  GI Imaging: - 5/21: CT Scattered colonic diverticulosis complicated by a perforated acute sigmoid diverticulitis - 5/24: Stable sigmoid diverticulitis  - 5/27: Continued presence of sigmoid diverticulitis with pneumoperitoneum consistent with perforation of the diverticulitis. There is interval development of 2 adjacent paradiverticular abscesses,  GI Surgeries / Procedures: 5/28:  Exploratory laparotomy, lysis of adhesions x60 minutes, sigmoid colectomy, mobilization of splenic flexure, creation of end colostomy, ileocecectomy with ileocolonic anastomosis  Central access: Place PICC (5/29) TPN start date: 5/29  Nutritional Goals (per RD recommendation on  ): kCal: 1600-1800, Protein: 90-114g, Fluid: >=2L Goal TPN rate is 80 mL/hr (provides 96 g of protein and 1843 kcals per day)  Current Nutrition:  NPO  Plan:  Start TPN at 40 mL/hr at 1800 Electrolytes in TPN: Na 21mEq/L, K 81mEq/L, Ca 92mEq/L, Mg 78mEq/L, and Phos 28mmol/L. Cl:Ac 1:1 Add standard MVI and trace elements to TPN Initiate Sensitive q4h SSI and adjust as needed  Reduce MIVF to 35 mL/hr at 1800 Monitor TPN labs on Mon/Thurs, and prn    Regina Roberts, PharmD, BCPS Clinical Staff Pharmacist [email or web address] Alford Roberts, [employer] 03/21/2021,9:30 AM

## 2021-03-22 LAB — COMPREHENSIVE METABOLIC PANEL
ALT: 37 U/L (ref 0–44)
AST: 15 U/L (ref 15–41)
Albumin: 2 g/dL — ABNORMAL LOW (ref 3.5–5.0)
Alkaline Phosphatase: 52 U/L (ref 38–126)
Anion gap: 6 (ref 5–15)
BUN: 11 mg/dL (ref 8–23)
CO2: 29 mmol/L (ref 22–32)
Calcium: 8.1 mg/dL — ABNORMAL LOW (ref 8.9–10.3)
Chloride: 106 mmol/L (ref 98–111)
Creatinine, Ser: 0.79 mg/dL (ref 0.44–1.00)
GFR, Estimated: >60 mL/min (ref >60)
Glucose, Bld: 150 mg/dL — ABNORMAL HIGH (ref 70–99)
Potassium: 3.6 mmol/L (ref 3.5–5.1)
Sodium: 141 mmol/L (ref 135–145)
Total Bilirubin: 0.5 mg/dL (ref 0.3–1.2)
Total Protein: 5.6 g/dL — ABNORMAL LOW (ref 6.5–8.1)

## 2021-03-22 LAB — DIFFERENTIAL
Abs Immature Granulocytes: 0 10*3/uL (ref 0.00–0.07)
Basophils Absolute: 0 10*3/uL (ref 0.0–0.1)
Basophils Relative: 0 %
Eosinophils Absolute: 0 10*3/uL (ref 0.0–0.5)
Eosinophils Relative: 0 %
Lymphocytes Relative: 4 %
Lymphs Abs: 1.2 10*3/uL (ref 0.7–4.0)
Monocytes Absolute: 0.6 10*3/uL (ref 0.1–1.0)
Monocytes Relative: 2 %
Neutro Abs: 27.3 10*3/uL — ABNORMAL HIGH (ref 1.7–7.7)
Neutrophils Relative %: 94 %
nRBC: 0 /100 WBC

## 2021-03-22 LAB — CBC
HCT: 26.8 % — ABNORMAL LOW (ref 36.0–46.0)
Hemoglobin: 8.8 g/dL — ABNORMAL LOW (ref 12.0–15.0)
MCH: 28.6 pg (ref 26.0–34.0)
MCHC: 32.8 g/dL (ref 30.0–36.0)
MCV: 87 fL (ref 80.0–100.0)
Platelets: 494 10*3/uL — ABNORMAL HIGH (ref 150–400)
RBC: 3.08 MIL/uL — ABNORMAL LOW (ref 3.87–5.11)
RDW: 15.3 % (ref 11.5–15.5)
WBC: 29 10*3/uL — ABNORMAL HIGH (ref 4.0–10.5)
nRBC: 0.1 % (ref 0.0–0.2)

## 2021-03-22 LAB — PREALBUMIN: Prealbumin: <5 mg/dL — ABNORMAL LOW (ref 18–38)

## 2021-03-22 LAB — MAGNESIUM: Magnesium: 2.1 mg/dL (ref 1.7–2.4)

## 2021-03-22 LAB — GLUCOSE, CAPILLARY
Glucose-Capillary: 140 mg/dL — ABNORMAL HIGH (ref 70–99)
Glucose-Capillary: 155 mg/dL — ABNORMAL HIGH (ref 70–99)

## 2021-03-22 LAB — TRIGLYCERIDES: Triglycerides: 94 mg/dL (ref <150)

## 2021-03-22 LAB — PHOSPHORUS: Phosphorus: 1.5 mg/dL — ABNORMAL LOW (ref 2.5–4.6)

## 2021-03-22 MED ORDER — FAT EMUL FISH OIL/PLANT BASED 20% (SMOFLIPID)IV EMUL
INTRAVENOUS | Status: AC
  Filled 2021-03-22: qty 480

## 2021-03-22 MED ORDER — ACETAMINOPHEN 325 MG PO TABS
650.0000 mg | ORAL_TABLET | Freq: Four times a day (QID) | ORAL | Status: DC | PRN
  Administered 2021-03-22: 650 mg via ORAL
  Filled 2021-03-22: qty 2

## 2021-03-22 MED ORDER — HYDROMORPHONE HCL 1 MG/ML IJ SOLN
0.5000 mg | INTRAMUSCULAR | Status: DC | PRN
  Administered 2021-03-22 – 2021-03-29 (×23): 1 mg via INTRAVENOUS
  Filled 2021-03-22 (×25): qty 1

## 2021-03-22 MED ORDER — POTASSIUM PHOSPHATES 15 MMOLE/5ML IV SOLN
30.0000 mmol | Freq: Once | INTRAVENOUS | Status: AC
  Administered 2021-03-22: 30 mmol via INTRAVENOUS
  Filled 2021-03-22: qty 10

## 2021-03-22 MED ORDER — TRAVASOL 10 % IV SOLN: INTRAVENOUS | Status: DC

## 2021-03-22 MED ORDER — INSULIN ASPART 100 UNIT/ML IJ SOLN
0.0000 [IU] | Freq: Four times a day (QID) | INTRAMUSCULAR | Status: DC
  Administered 2021-03-22: 1 [IU] via SUBCUTANEOUS
  Administered 2021-03-22: 2 [IU] via SUBCUTANEOUS
  Administered 2021-03-23 – 2021-03-25 (×10): 1 [IU] via SUBCUTANEOUS

## 2021-03-22 NOTE — Progress Notes (Signed)
PHARMACY - TOTAL PARENTERAL NUTRITION CONSULT NOTE  Indication: Prolonged ileus  Patient Measurements: Height: 4' 11.5" (151.1 cm) Weight: 84.8 kg (186 lb 15.2 oz) IBW/kg (Calculated) : 44.35 TPN AdjBW (KG): 54.5 Body mass index is 37.13 kg/m.  Assessment:  89 YOF presented on 03/12/21 with abdominal pain, found to have perforated sigmoid diverticulitis and treated with conservative measures.  Patient tolerated FLD on 5/25 and then advanced to a soft diet on 5/26.  She soon worsened and repeat CT showed progression with 2 new paracolonic air-fluid collections.  Underwent ex-lap with LoA, colectomy, colostomy on 5/29.  Pharmacy consulted to manage TPN for prolonged ileus.  Glucose / Insulin: no hx DM - AM glucose < 180 Electrolytes: K down to 3.6 (goal >/= 4), Phos 1.5, others WNL Renal: SCr < 1, BUN WNL Hepatic: LFTs / tbili / TG WNL.  Prealbumin down to < 5 likely d/t stress, albumin 2. Intake / Output; MIVF: UOP 1L, NS at 35 ml/hr, net +10L  GI Imaging: 5/21 CT: scattered colonic diverticulosis complicated by a perf acute sigmoid diverticulitis 5/24: stable sigmoid diverticulitis  5/27: pneumoperitoneum consistent with perf diverticulitis; interval development of 2 adjacent paradiverticular abscesses GI Surgeries / Procedures:  5/28: ex-lap with LoA, sigmoid colectomy, mobilization of splenic flexure, creation of end colostomy, ileocecectomy with ileocolonic anastomosis  Central access: PICC placed 03/21/21 TPN start date: 03/21/21  Nutritional Goals (RD rec pending): kCal: 1600-1800, Protein: 90-114g, Fluid: >=2L  Current Nutrition:  TPN  Plan:  Continue TPN at 40 ml/hr (goal rate 80 ml/hr) Electrolytes in TPN: Na 46mEq/L, increase K 28mEq/L, Ca 82mEq/L, Mg 2mEq/L, increase Phos 19mmol/L, Cl:Ac 1:1 Add standard MVI and trace elements to TPN Initiate sensitive SSI Q6H KPhos 30 mmol IV x 1  NS at 35 mL/hr F/U AM labs to advance TPN to goal  Kaheem Halleck D. Mina Marble, PharmD, BCPS,  [place] 03/22/2021, 8:18 AM

## 2021-03-22 NOTE — Progress Notes (Signed)
Progress Note  2 Days Post-Op  Subjective: Very sleepy this am, a little difficult to rouse but does answer questions  Objective: Vital signs in last 24 hours: Temp:  [98.3 F (36.8 C)-100.5 F (38.1 C)] 99.4 F (37.4 C) (05/30 0613) Pulse Rate:  [98-109] 103 (05/30 0613) Resp:  [16-18] 18 (05/30 0613) BP: (133-165)/(72-91) 145/72 (05/30 0613) SpO2:  [95 %-98 %] 96 % (05/30 1610) Weight:  [84.8 kg] 84.8 kg (05/30 0700) Last BM Date: 03/12/21  Intake/Output from previous day: 05/29 0701 - 05/30 0700 In: 1433.6 [P.O.:60; I.V.:1273.6; IV Piggyback:100] Out: 1000 [Urine:1000] Intake/Output this shift: No intake/output data recorded.  PE: General: pleasant, WD,obesefemale who is laying in bed hard asleep Heart: mild tachy but reg rhythm. Lungs: Respiratory effort nonlabored Abd: soft, distended, dressing intact. Ostomy viable - a little dark, no air in bag RU:EAVWU of LUEimproving Skin: warm and dry with no masses, lesions, or rashes Psych: sleep but Ox3 with an appropriate affect.   Lab Results:  Recent Labs    03/20/21 0100 03/22/21 0334  WBC 17.1* 29.0*  HGB 10.1* 8.8*  HCT 30.5* 26.8*  PLT 455* 494*   BMET Recent Labs    03/20/21 0456 03/22/21 0334  NA  --  141  K  --  3.6  CL  --  106  CO2  --  29  GLUCOSE  --  150*  BUN  --  11  CREATININE 0.91 0.79  CALCIUM  --  8.1*   PT/INR No results for input(s): LABPROT, INR in the last 72 hours. CMP     Component Value Date/Time   NA 141 03/22/2021 0334   K 3.6 03/22/2021 0334   CL 106 03/22/2021 0334   CO2 29 03/22/2021 0334   GLUCOSE 150 (H) 03/22/2021 0334   BUN 11 03/22/2021 0334   CREATININE 0.79 03/22/2021 0334   CALCIUM 8.1 (L) 03/22/2021 0334   PROT 5.6 (L) 03/22/2021 0334   ALBUMIN 2.0 (L) 03/22/2021 0334   AST 15 03/22/2021 0334   ALT 37 03/22/2021 0334   ALKPHOS 52 03/22/2021 0334   BILITOT 0.5 03/22/2021 0334   GFRNONAA >60 03/22/2021 0334   Lipase     Component Value  Date/Time   LIPASE 21 03/12/2021 1959       Studies/Results: [place] EKG SITE RITE  Result Date: 03/21/2021 If Site Rite image not attached, placement could not be confirmed due to current cardiac rhythm.   Anti-infectives: Anti-infectives (From admission, onward)   Start     Dose/Rate Route Frequency Ordered Stop   03/20/21 0715  cefoTEtan (CEFOTAN) 2 g in sodium chloride 0.9 % 100 mL IVPB        2 g 200 mL/hr over 30 Minutes Intravenous On call to O.R. 03/20/21 9811 03/20/21 0951   03/19/21 1400  ertapenem (INVANZ) 1,000 mg in sodium chloride 0.9 % 100 mL IVPB        1 g 200 mL/hr over 30 Minutes Intravenous Every 24 hours 03/19/21 1224     03/18/21 1200  amoxicillin-clavulanate (AUGMENTIN) 875-125 MG per tablet 1 tablet  Status:  Discontinued        1 tablet Oral Every 12 hours 03/18/21 0759 03/19/21 1224   03/13/21 1400  piperacillin-tazobactam (ZOSYN) IVPB 3.375 g  Status:  Discontinued        3.375 g 12.5 mL/hr over 240 Minutes Intravenous Every 8 hours 03/13/21 0757 03/18/21 0759   03/13/21 0615  piperacillin-tazobactam (ZOSYN) IVPB 3.375 g  3.375 g 12.5 mL/hr over 240 Minutes Intravenous  Once 03/13/21 0604 03/13/21 1247       Assessment/Plan HTN HLD GERD LUE edema -improving, [place] 5/24 negative for SVT or DVT  Perforatedsigmoiddiverticulitis  -s/p exp lap, ileocecectomy, repair SB serosal tears, LOA, colectomy/end colostomy, 5/28 Dr Kae Heller -expect ileus -woc consult for ostomy -w-d dressing bid Pulm: pulm toilet, IS FEN: NPO for surgery, cont TPN, NG pulled out early this am- min output from surgery VTE: lovenox ID: Zosyn 5/21>5/26; PO Augmentin 5/26>>5/27, Ertapenem 5/27->cont for 4 days given intra-abd contamination; wbc significant jump 29 Protein calorie malnutrition - prealbumin 7.8 on 5/25-  Dispo: dc foley today, stop pca, prn IV pain med, pt/ot. Monitor wbc - wound ok, ostomy ok, no calf tenderness    LOS: 9 days    Greer Pickerel,  MD [name] Surgery 03/22/2021, 9:28 AM Please see Amion for pager number during day hours 7:00am-4:30pm

## 2021-03-22 NOTE — Progress Notes (Signed)
PCA d/c at 19:27, 03/22/21. IV team ordered to d/c from patient. Verified with [place], RN- will waste once IV team has d/c'd.

## 2021-03-22 NOTE — Evaluation (Signed)
Physical Therapy Evaluation Patient Details Name: Regina Roberts MRN: [id number] DOB: 09/12/1951 Today's Date: 03/22/2021   History of Present Illness  Pt is a 70 y/o female admitted secondary to worsening abdominal pain on 5/20. Found to have perforated sigmoid diverticulitis and is s/p exploratory laparotomy with colostomy creation on 5/28. PMH includes HTN and hysterectomy.  Clinical Impression  Pt admitted secondary to problem above with deficits below. Pt limited secondary to increased pain and reporting a "stomachache" like pain this session. Pt requiring mod A for bed mobility and min A to stand using RW. Reporting increased pain in standing and was only able to take side steps at EOB. Will need to progress mobility further to determine most appropriate d/c recommendations. If pt with slow progression, will likely require SNF level therapies, however, if pain controlled and pt progresses well, will likely be able to d/c with [name] services. Will continue to follow acutely and update recommendations according to pt progression.     Follow Up Recommendations Supervision for mobility/OOB (HHPT vs SNF pending progression)    Equipment Recommendations  Rolling walker with 5" wheels;3in1 (PT)    Recommendations for Other Services       Precautions / Restrictions Precautions Precautions: Fall;Other (comment) Precaution Comments: colostomy Restrictions Weight Bearing Restrictions: No      Mobility  Bed Mobility Overal bed mobility: Needs Assistance Bed Mobility: Supine to Sit;Sit to Supine     Supine to sit: Mod assist Sit to supine: Mod assist   General bed mobility comments: Mod A For trunk and LE assist. Increased time to perform bed mobility tasks secondary to pain.    Transfers Overall transfer level: Needs assistance Equipment used: Rolling walker (2 wheeled) Transfers: Sit to/from Stand Sit to Stand: Min assist         General transfer comment: Min A for  lift assist and steadying. Pt with increased pain in standing and was very limited in mobility tolerance. Was able to take a few side steps at EOB for repositioning.  Ambulation/Gait                Stairs            Wheelchair Mobility    Modified Rankin (Stroke Patients Only)       Balance Overall balance assessment: Needs assistance Sitting-balance support: No upper extremity supported;Feet supported Sitting balance-Leahy Scale: Fair     Standing balance support: Bilateral upper extremity supported Standing balance-Leahy Scale: Poor Standing balance comment: Reliant on BUE support                             Pertinent Vitals/Pain Pain Assessment: Faces Faces Pain Scale: Hurts whole lot Pain Location: stomach and reporting as "stomachache" Pain Descriptors / Indicators: Aching;Operative site guarding Pain Intervention(s): Monitored during session;Limited activity within patient's tolerance;Repositioned    Home Living Family/patient expects to be discharged to:: Private residence Living Arrangements: Spouse/significant other Available Help at Discharge: Family;Available 24 hours/day Type of Home: House Home Access: Stairs to enter Entrance Stairs-Rails: Psychiatric nurse of Steps: 5 Home Layout: One level Home Equipment: None      Prior Function Level of Independence: Independent               Hand Dominance        Extremity/Trunk Assessment   Upper Extremity Assessment Upper Extremity Assessment: Defer to OT evaluation    Lower Extremity Assessment Lower Extremity Assessment: Generalized weakness  Cervical / Trunk Assessment Cervical / Trunk Assessment: Other exceptions Cervical / Trunk Exceptions: s/p abdominal surgery; colostomy  Communication   Communication: No difficulties  Cognition Arousal/Alertness: Awake/alert Behavior During Therapy: WFL for tasks assessed/performed Overall Cognitive Status:  No family/caregiver present to determine baseline cognitive functioning                                        General Comments      Exercises     Assessment/Plan    PT Assessment Patient needs continued PT services  PT Problem List Decreased strength;Decreased activity tolerance;Decreased balance;Decreased mobility;Decreased knowledge of use of DME;Decreased knowledge of precautions;Pain       PT Treatment Interventions DME instruction;Functional mobility training;Therapeutic activities;Therapeutic exercise;Stair training;Gait training;Balance training;Patient/family education    PT Goals (Current goals can be found in the Care Plan section)  Acute Rehab PT Goals Patient Stated Goal: to decrease pain PT Goal Formulation: With patient Time For Goal Achievement: 04/05/21 Potential to Achieve Goals: Good    Frequency Min 3X/week   Barriers to discharge        Co-evaluation               AM-PAC PT "6 Clicks" Mobility  Outcome Measure Help needed turning from your back to your side while in a flat bed without using bedrails?: A Little Help needed moving from lying on your back to sitting on the side of a flat bed without using bedrails?: A Lot Help needed moving to and from a bed to a chair (including a wheelchair)?: A Little Help needed standing up from a chair using your arms (e.g., wheelchair or bedside chair)?: A Little Help needed to walk in hospital room?: A Lot Help needed climbing 3-5 steps with a railing? : A Lot 6 Click Score: 15    End of Session   Activity Tolerance: Patient limited by pain Patient left: in bed;with call bell/phone within reach;with bed alarm set Nurse Communication: Mobility status PT Visit Diagnosis: Unsteadiness on feet (R26.81);Muscle weakness (generalized) (M62.81);Difficulty in walking, not elsewhere classified (R26.2);Pain Pain - part of body:  (abdomen)    Time: 2993-7169 PT Time Calculation (min) (ACUTE  ONLY): 21 min   Charges:   PT Evaluation $PT Eval Moderate Complexity: 1 Mod          Regina Roberts, PT, DPT  Acute Rehabilitation Services  Pager: [phone] Office: [phone]   Regina Roberts 03/22/2021, 1:18 PM

## 2021-03-22 NOTE — Consult Note (Addendum)
[facility] Nurse ostomy consult note Stoma type/location: LMQ colostomy 3 cm from midline abdominal incision Stomal assessment/size: 1" pink and moist.  Well budded.   Blood tinged liquid only in pouch Peristomal assessment: creasing at 3 and 9 o'clock.  Will implement 2 3/4" pouch  Due to close proximity of stoma to incision, I have cut barrier off center and trimmed the perimeter.  The pattern is left at the bedside in case of leak and need for change.  Treatment options for stomal/peristomal skin: barrier ring  Output blood tinged liquid in pouch only Ostomy pouching: 2 piece 2 3/4" pouch with barrier ring.  Education provided: Patient minimally participative.  We discussed twice weekly pouch changes and emptying when 1/3 full. Patient lives at home with husband but states that she will be changing her pouch.  More education to come as patient is more arousable and participative.  Enrolled patient in [place] program: Yes will today.  Will follow.  Domenic Moras MSN, RN, FNP-BC CWON Wound, Ostomy, Continence Nurse Pager [phone]

## 2021-03-22 NOTE — Progress Notes (Signed)
Writer went to check on patient. Noted that patient's whole NGT came out. Patient stated "I must have been dreaming'. [place],MD made aware. MD called back and stated that we can leave NGT out and to monitor patient.  Call bell within reach and will continue to monitor. VS BP145/72 T99.4 RR18 PR103 99%/2L.

## 2021-03-22 NOTE — Progress Notes (Signed)
Initial Nutrition Assessment  DOCUMENTATION CODES:   Not applicable  INTERVENTION:   TPN order per Pharmacy  Agree with holding on TPN advancement today given phosphorus <2.0  NUTRITION DIAGNOSIS:   Inadequate oral intake related to altered GI function as evidenced by NPO status.  GOAL:   Patient will meet greater than or equal to 90% of their needs  MONITOR:   Diet advancement,Labs,Weight trends,I & O's  REASON FOR ASSESSMENT:   Consult New TPN/TNA  ASSESSMENT:   70 yo female admitted with perforated sigmoid diverticulitis, failed medical therapy x 1 week ultimately requiring surgical intervention. PMH includes GERD, HTN, HLD   5/21 Admitted, colonic diverticulosis with oerforated acute sig diverticulitis 5/27 CT 2 new para colonic air-fluid colections 5/28 Ex lap, LOA, sig colectomy, end colostomy, ileocecectomy with ileocolonic anastamosis 5/29 TPN initiated, PICC placed  Pt in significant amount of pain on visit today; cannot seem to get comfortable. Not able to get very detailed history from patient at this time  Currently NPO, TPN at 40 ml/hr (half goal rate)  No N/V, no stool via ostomy, no flatus. +abd pain  Pt reports she is getting up and ambulating  Current wt 84.8 kg; weights appear relatively stable per chart reivew  Labs: phosphorus 1.5 (L) Meds: potassium phosphate    Diet Order:   Diet Order            Diet NPO time specified Except for: Ice Chips, Sips with Meds, Other (See Comments)  Diet effective now                 EDUCATION NEEDS:   Not appropriate for education at this time  Skin:  Skin Assessment: Skin Integrity Issues: Skin Integrity Issues:: Incisions Incisions: abdomen, new colostomy  Last BM:  5/20  Height:   Ht Readings from Last 1 Encounters:  03/22/21 4' 11.5" (1.511 m)    Weight:   Wt Readings from Last 1 Encounters:  03/22/21 84.8 kg    BMI:  Body mass index is 37.13 kg/m.  Estimated Nutritional  Needs:   Kcal:  1770-1980 kcals  Protein:  90-100 g  Fluid:  >/= 1.8 L   Kerman Passey MS, RDN, LDN, CNSC Registered Dietitian III Clinical Nutrition RD Pager and On-Call Pager Number Located in [place]

## 2021-03-22 NOTE — Progress Notes (Signed)
   03/22/21 0233  Assess: MEWS Score  Temp (!) 100.5 F (38.1 C)  BP (!) 161/87  Pulse Rate (!) 106  Resp 18  SpO2 97 %  O2 Device Nasal Cannula  Assess: MEWS Score  MEWS Temp 1  MEWS Systolic 0  MEWS Pulse 1  MEWS RR 0  MEWS LOC 0  MEWS Score 2  MEWS Score Color Yellow  Assess: if the MEWS score is Yellow or Red  Were vital signs taken at a resting state? Yes  Focused Assessment Change from prior assessment (see assessment flowsheet)  Early Detection of Sepsis Score *See Row Information* Low  MEWS guidelines implemented *See Row Information* Yes  Treat  MEWS Interventions Escalated (See documentation below)  Take Vital Signs  Increase Vital Sign Frequency  Yellow: Q 2hr X 2 then Q 4hr X 2, if remains yellow, continue Q 4hrs  Escalate  MEWS: Escalate Yellow: discuss with charge nurse/RN and consider discussing with provider and RRT  Notify: Charge Nurse/RN  Name of Charge Nurse/RN Notified Nelie,RN  Date Charge Nurse/RN Notified 03/22/21  Time Charge Nurse/RN Notified 0234  Notify: Provider  Provider Name/Title Kinsinger,MD  Date Provider Notified 03/22/21  Time Provider Notified 0244  Notification Type Page  Notification Reason Change in status  Provider response See new orders  Date of Provider Response 03/22/21  Time of Provider Response 0247  Document  Patient Outcome Stabilized after interventions  Progress note created (see row info) Yes    Repeat V/S are as follows: T99.6 BP165/91 PR99 RR18 02sat 96%2L  Will continue to close monitor patient.

## 2021-03-22 NOTE — Progress Notes (Signed)
Report given to Naomi, RN

## 2021-03-23 LAB — CBC WITH DIFFERENTIAL/PLATELET
Abs Immature Granulocytes: 0 10*3/uL (ref 0.00–0.07)
Basophils Absolute: 0 10*3/uL (ref 0.0–0.1)
Basophils Relative: 0 %
Eosinophils Absolute: 0.3 10*3/uL (ref 0.0–0.5)
Eosinophils Relative: 1 %
HCT: 27.1 % — ABNORMAL LOW (ref 36.0–46.0)
Hemoglobin: 9.1 g/dL — ABNORMAL LOW (ref 12.0–15.0)
Lymphocytes Relative: 9 %
Lymphs Abs: 2.3 10*3/uL (ref 0.7–4.0)
MCH: 28.6 pg (ref 26.0–34.0)
MCHC: 33.6 g/dL (ref 30.0–36.0)
MCV: 85.2 fL (ref 80.0–100.0)
Monocytes Absolute: 0.3 10*3/uL (ref 0.1–1.0)
Monocytes Relative: 1 %
Neutro Abs: 23.2 10*3/uL — ABNORMAL HIGH (ref 1.7–7.7)
Neutrophils Relative %: 89 %
Platelets: 509 10*3/uL — ABNORMAL HIGH (ref 150–400)
RBC: 3.18 MIL/uL — ABNORMAL LOW (ref 3.87–5.11)
RDW: 15.2 % (ref 11.5–15.5)
WBC: 26.1 10*3/uL — ABNORMAL HIGH (ref 4.0–10.5)
nRBC: 0 % (ref 0.0–0.2)
nRBC: 0 /100 WBC

## 2021-03-23 LAB — GLUCOSE, CAPILLARY
Glucose-Capillary: 133 mg/dL — ABNORMAL HIGH (ref 70–99)
Glucose-Capillary: 137 mg/dL — ABNORMAL HIGH (ref 70–99)
Glucose-Capillary: 145 mg/dL — ABNORMAL HIGH (ref 70–99)
Glucose-Capillary: 145 mg/dL — ABNORMAL HIGH (ref 70–99)
Glucose-Capillary: 146 mg/dL — ABNORMAL HIGH (ref 70–99)

## 2021-03-23 LAB — BASIC METABOLIC PANEL
Anion gap: 7 (ref 5–15)
BUN: 9 mg/dL (ref 8–23)
CO2: 28 mmol/L (ref 22–32)
Calcium: 8.1 mg/dL — ABNORMAL LOW (ref 8.9–10.3)
Chloride: 105 mmol/L (ref 98–111)
Creatinine, Ser: 0.67 mg/dL (ref 0.44–1.00)
GFR, Estimated: >60 mL/min (ref >60)
Glucose, Bld: 129 mg/dL — ABNORMAL HIGH (ref 70–99)
Potassium: 3.6 mmol/L (ref 3.5–5.1)
Sodium: 140 mmol/L (ref 135–145)

## 2021-03-23 LAB — PHOSPHORUS: Phosphorus: 2.6 mg/dL (ref 2.5–4.6)

## 2021-03-23 LAB — MAGNESIUM: Magnesium: 2.1 mg/dL (ref 1.7–2.4)

## 2021-03-23 MED ORDER — METOPROLOL TARTRATE 5 MG/5ML IV SOLN
5.0000 mg | Freq: Two times a day (BID) | INTRAVENOUS | Status: DC
  Administered 2021-03-23 – 2021-03-30 (×15): 5 mg via INTRAVENOUS
  Filled 2021-03-23 (×15): qty 5

## 2021-03-23 MED ORDER — POTASSIUM CHLORIDE 10 MEQ/50ML IV SOLN
10.0000 meq | INTRAVENOUS | Status: AC
Start: 2021-03-23 — End: 2021-03-23
  Administered 2021-03-23 (×2): 10 meq via INTRAVENOUS
  Filled 2021-03-23 (×4): qty 50

## 2021-03-23 MED ORDER — FAT EMUL FISH OIL/PLANT BASED 20% (SMOFLIPID)IV EMUL
INTRAVENOUS | Status: AC
  Filled 2021-03-23: qty 720

## 2021-03-23 MED ORDER — POTASSIUM PHOSPHATES 15 MMOLE/5ML IV SOLN
10.0000 mmol | Freq: Once | INTRAVENOUS | Status: DC
  Filled 2021-03-23: qty 3.33

## 2021-03-23 MED ORDER — POTASSIUM CHLORIDE 10 MEQ/50ML IV SOLN
10.0000 meq | INTRAVENOUS | Status: AC
  Administered 2021-03-23 (×2): 10 meq via INTRAVENOUS
  Filled 2021-03-23 (×2): qty 50

## 2021-03-23 NOTE — Progress Notes (Signed)
PHARMACY - TOTAL PARENTERAL NUTRITION CONSULT NOTE  Indication: Prolonged ileus  Patient Measurements: Height: 4' 11.5" (151.1 cm) Weight: 84.8 kg (186 lb 15.2 oz) IBW/kg (Calculated) : 44.35 TPN AdjBW (KG): 54.5 Body mass index is 37.13 kg/m.  Assessment:  60 YOF presented on 03/12/21 with abdominal pain, found to have perforated sigmoid diverticulitis and treated with conservative measures.  Patient tolerated FLD on 5/25 and then advanced to a soft diet on 5/26.  She soon worsened and repeat CT showed progression with 2 new paracolonic air-fluid collections.  Underwent ex-lap with LoA, colectomy, colostomy on 5/29.  Pharmacy consulted to manage TPN for prolonged ileus.  Glucose / Insulin: no hx DM - CBGs < 180. Used 2 units SSI in past 24 hrs. Electrolytes: K unchanged at 3.6 post ~11mEq (goal >/= 4), Phos up to 2.6 post 30 mmol, others WNL Renal: SCr < 1, BUN WNL Hepatic: LFTs / tbili / TG WNL.  Prealbumin down to < 5 likely d/t stress, albumin 2. Intake / Output; MIVF: UOP 0.3 ml/kg/hr, NS at 35 ml/hr, net +14L GI Imaging: 5/21 CT: scattered colonic diverticulosis complicated by a perf acute sigmoid diverticulitis 5/24: stable sigmoid diverticulitis  5/27: pneumoperitoneum consistent with perf diverticulitis; interval development of 2 adjacent paradiverticular abscesses GI Surgeries / Procedures:  5/28: ex-lap with LoA, sigmoid colectomy, mobilization of splenic flexure, creation of end colostomy, ileocecectomy with ileocolonic anastomosis  Central access: PICC placed 03/21/21 TPN start date: 03/21/21  Nutritional Goals (RD rec on 5/30): 1770-1980 kCal, 90-100g protein, >/= 1.8L fluid per day  Current Nutrition:  TPN  Plan:  Increase TPN to 60 ml/hr (goal rate 80 ml/hr) Electrolytes in TPN: Na 57mEq/L, K 32mEq/L, Ca 55mEq/L, Mg 29mEq/L, Phos 49mmol/L, Cl:Ac 1:1 - all lytes increase with increased TPN rate Add standard MVI and trace elements to TPN Continue sensitive SSI Q6H -  D/C if CBGs remain controlled at goal TPN rate KPhos 10 mmol IV x 1  KCL x 4 runs Reduce NS to KVO F/U AM labs, volume status  Raidyn Breiner D. Mina Marble, PharmD, BCPS, [place] 03/23/2021, 7:36 AM

## 2021-03-23 NOTE — Progress Notes (Signed)
03/23/21 1551  PT Visit Information  Last PT Received On 03/23/21  Assistance Needed +1  History of Present Illness Pt is a 70 y/o female admitted secondary to worsening abdominal pain on 5/20. Found to have perforated sigmoid diverticulitis and is s/p exploratory laparotomy with colostomy creation on 5/28. PMH includes HTN and hysterectomy.  Subjective Data  Patient Stated Goal to decrease pain  Precautions  Precautions Fall;Other (comment)  Precaution Comments colostomy  Restrictions  Weight Bearing Restrictions No  Pain Assessment  Pain Assessment 0-10  Pain Score 4  Pain Location stomach  Pain Descriptors / Indicators Aching;Operative site guarding;Grimacing;Guarding;Discomfort  Pain Intervention(s) Limited activity within patient's tolerance;Monitored during session;Repositioned  Cognition  Arousal/Alertness Awake/alert  Behavior During Therapy WFL for tasks assessed/performed  Overall Cognitive Status Within Functional Limits for tasks assessed  Bed Mobility  Overal bed mobility Needs Assistance  Bed Mobility Sit to Supine  Sit to supine Min assist  General bed mobility comments Min A for LE assist. Increased time required secondary to pain.  Transfers  Overall transfer level Needs assistance  Equipment used None  Transfers Sit to/from Stand  Sit to Stand Min guard  General transfer comment Min guard for safety. Increased time to power to standing secondary to pain, however, much improved stand from previous session.  Ambulation/Gait  Ambulation/Gait assistance Min guard  Gait Distance (Feet) 25 Feet  Assistive device None  Gait Pattern/deviations Step-through pattern;Decreased stride length  General Gait Details Slow, guarded gait, but much improved tolerance from previous session. Min guard for safety. Distance limited secondary to pain. Was able to ambulate within the room and to bathroom  Gait velocity Decreased  Balance  Overall balance assessment Needs  assistance  Sitting-balance support No upper extremity supported;Feet supported  Sitting balance-Leahy Scale Fair  Standing balance support No upper extremity supported  Standing balance-Leahy Scale Fair  General Comments  General comments (skin integrity, edema, etc.) Pt's husband present during session  PT - End of Session  Activity Tolerance Patient tolerated treatment well  Patient left in bed;with call bell/phone within reach;with family/visitor present  Nurse Communication Mobility status   PT - Assessment/Plan  PT Plan Discharge plan needs to be updated  PT Visit Diagnosis Unsteadiness on feet (R26.81);Muscle weakness (generalized) (M62.81);Difficulty in walking, not elsewhere classified (R26.2);Pain  Pain - part of body  (abdomen)  PT Frequency (ACUTE ONLY) Min 3X/week  Follow Up Recommendations Home health PT;Supervision for mobility/OOB  PT equipment Rolling walker with 5" wheels;3in1 (PT)  AM-PAC PT "6 Clicks" Mobility Outcome Measure (Version 2)  Help needed turning from your back to your side while in a flat bed without using bedrails? 3  Help needed moving from lying on your back to sitting on the side of a flat bed without using bedrails? 3  Help needed moving to and from a bed to a chair (including a wheelchair)? 3  Help needed standing up from a chair using your arms (e.g., wheelchair or bedside chair)? 3  Help needed to walk in hospital room? 3  Help needed climbing 3-5 steps with a railing?  3  6 Click Score 18  Consider Recommendation of Discharge To: Home with [facility]  PT Goal Progression  Progress towards PT goals Progressing toward goals  Acute Rehab PT Goals  PT Goal Formulation With patient  Time For Goal Achievement 04/05/21  Potential to Achieve Goals Good  PT Time Calculation  PT Start Time (ACUTE ONLY) 1506  PT Stop Time (ACUTE ONLY) 1530  PT Time Calculation (min) (ACUTE ONLY) 24 min  PT General Charges  $$ ACUTE PT VISIT 1 Visit  PT Treatments  $Gait  Training 8-22 mins  $Therapeutic Activity 8-22 mins   Pt progressing well towards goals. Pt with increased tolerance for mobility tasks and was able to ambulate within the room and to bathroom. Min guard for transfers and gait and min A for bed mobility. Given pt's progression, updated recommendations to HHPT at d/c. Will continue to follow acutely.   Reuel Derby, PT, DPT  Acute Rehabilitation Services  Pager: [phone] Office: [phone]

## 2021-03-23 NOTE — Progress Notes (Signed)
Progress Note  3 Days Post-Op  Subjective: Patient reports some pain this AM but overall ok. Denies nausea. No output from stoma yet. Ambulated yesterday, encouraged mobilization again today.   Objective: Vital signs in last 24 hours: Temp:  [98.7 F (37.1 C)-99.5 F (37.5 C)] 98.7 F (37.1 C) (05/31 0518) Pulse Rate:  [87-99] 93 (05/31 0518) Resp:  [17-30] 17 (05/31 0518) BP: (132-164)/(76-93) 144/86 (05/31 0518) SpO2:  [95 %-99 %] 95 % (05/31 0518) Last BM Date: 03/12/21  Intake/Output from previous day: 05/30 0701 - 05/31 0700 In: 4394.4 [P.O.:100; I.V.:3977.5; IV Piggyback:316.9] Out: 600 [Urine:600] Intake/Output this shift: No intake/output data recorded.  PE: General: pleasant, WD, overweight female who is laying in bed in NAD Heart: regular, rate, and rhythm.   Lungs: CTAB, no wheezes, rhonchi, or rales noted.  Respiratory effort nonlabored Abd: soft, appropriately ttp, mildly distended, BS hypoactive, midline wound appears clean, stoma viable with some serous fluid in bag    Lab Results:  Recent Labs    03/22/21 0334  WBC 29.0*  HGB 8.8*  HCT 26.8*  PLT 494*   BMET Recent Labs    03/22/21 0334 03/23/21 0246  NA 141 140  K 3.6 3.6  CL 106 105  CO2 29 28  GLUCOSE 150* 129*  BUN 11 9  CREATININE 0.79 0.67  CALCIUM 8.1* 8.1*   PT/INR No results for input(s): LABPROT, INR in the last 72 hours. CMP     Component Value Date/Time   NA 140 03/23/2021 0246   K 3.6 03/23/2021 0246   CL 105 03/23/2021 0246   CO2 28 03/23/2021 0246   GLUCOSE 129 (H) 03/23/2021 0246   BUN 9 03/23/2021 0246   CREATININE 0.67 03/23/2021 0246   CALCIUM 8.1 (L) 03/23/2021 0246   PROT 5.6 (L) 03/22/2021 0334   ALBUMIN 2.0 (L) 03/22/2021 0334   AST 15 03/22/2021 0334   ALT 37 03/22/2021 0334   ALKPHOS 52 03/22/2021 0334   BILITOT 0.5 03/22/2021 0334   GFRNONAA >60 03/23/2021 0246   Lipase     Component Value Date/Time   LIPASE 21 03/12/2021 1959        Studies/Results: No results found.  Anti-infectives: Anti-infectives (From admission, onward)   Start     Dose/Rate Route Frequency Ordered Stop   03/20/21 0715  cefoTEtan (CEFOTAN) 2 g in sodium chloride 0.9 % 100 mL IVPB        2 g 200 mL/hr over 30 Minutes Intravenous On call to O.R. 03/20/21 7628 03/20/21 0951   03/19/21 1400  ertapenem (INVANZ) 1,000 mg in sodium chloride 0.9 % 100 mL IVPB        1 g 200 mL/hr over 30 Minutes Intravenous Every 24 hours 03/19/21 1224     03/18/21 1200  amoxicillin-clavulanate (AUGMENTIN) 875-125 MG per tablet 1 tablet  Status:  Discontinued        1 tablet Oral Every 12 hours 03/18/21 0759 03/19/21 1224   03/13/21 1400  piperacillin-tazobactam (ZOSYN) IVPB 3.375 g  Status:  Discontinued        3.375 g 12.5 mL/hr over 240 Minutes Intravenous Every 8 hours 03/13/21 0757 03/18/21 0759   03/13/21 0615  piperacillin-tazobactam (ZOSYN) IVPB 3.375 g        3.375 g 12.5 mL/hr over 240 Minutes Intravenous  Once 03/13/21 0604 03/13/21 1247       Assessment/Plan HTN HLD GERD LUE edema -improving, [place] 5/24 negative for SVT or DVT Severe Protein calorie malnutrition -  prealbumin 7.8 (5/28) and <5 yesterday AM  Perforatedsigmoiddiverticulitis  POD3 s/p exp lap, ileocecectomy, repair SB serosal tears, LOA, colectomy/end colostomy, 5/28 Dr Kae Heller - expect ileus, ok to continue ice chips and sips as long as patient not nauseated - WOC following for stoma  - continue BID dressing changes - pulm toilet, IS, encourage mobilization  - recheck WBC this AM - continue TPN while awaiting return in bowel function   FEN: ice chips and sips, TPN, IVF KVO, SSI VTE: lovenox ID: Zosyn 5/21>5/26; PO Augmentin 5/26>5/27, Ertapenem 5/27>>cont for 4 days given intra-abd contamination, awaiting repeat CBC this AM    LOS: 10 days    [name], [facility] Surgery 03/23/2021, 9:47 AM Please see Amion for pager number during day hours  7:00am-4:30pm

## 2021-03-23 NOTE — Evaluation (Signed)
Occupational Therapy Evaluation Patient Details Name: Regina Roberts MRN: [id number] DOB: 16-Jan-1951 Today's Date: 03/23/2021    History of Present Illness Pt is a 70 y/o female admitted secondary to worsening abdominal pain on 5/20. Found to have perforated sigmoid diverticulitis and is s/p exploratory laparotomy with colostomy creation on 5/28. PMH includes HTN and hysterectomy.   Clinical Impression   Pt presents with decline in function and safety with ADLs and ADL mobility with impaired balance and endurance; pt limited by pain at surgical site. PTA, pt lived at home with her husband and was Ind with ADLs/selfcare, IADLs, home mgt and no AD for mobility. Pt currently requires min A to sit EOB, max A with LB ADLs. Min guard A for grooming standing and min A with ADL transfers/mobility using RW. Pt would benefit from acute OT services to address impairments to maximize level of function and safety    Follow Up Recommendations  Home health OT (vs no f/u OT depending on acute stay progress)   Equipment Recommendations  3 in 1 bedside commode;Tub/shower seat;Other (comment) (RW, ADL A/E)    Recommendations for Other Services       Precautions / Restrictions Precautions Precautions: Fall;Other (comment) Precaution Comments: colostomy Restrictions Weight Bearing Restrictions: No      Mobility Bed Mobility Overal bed mobility: Needs Assistance Bed Mobility: Supine to Sit;Rolling Rolling: Min assist   Supine to sit: Min assist     General bed mobility comments: Increased time and effort    Transfers Overall transfer level: Needs assistance Equipment used: Rolling walker (2 wheeled) Transfers: Sit to/from Stand Sit to Stand: Min assist         General transfer comment: Pt ambulated to bathroom for toileting usnig RW    Balance Overall balance assessment: Needs assistance Sitting-balance support: No upper extremity supported;Feet supported Sitting  balance-Leahy Scale: Fair     Standing balance support: Bilateral upper extremity supported;During functional activity Standing balance-Leahy Scale: Poor                             ADL either performed or assessed with clinical judgement   ADL Overall ADL's : Needs assistance/impaired Eating/Feeding: Set up;Independent;Sitting   Grooming: Wash/dry hands;Wash/dry face;Min guard;Standing   Upper Body Bathing: Set up;Supervision/ safety;Sitting   Lower Body Bathing: Maximal assistance   Upper Body Dressing : Set up;Supervision/safety;Sitting   Lower Body Dressing: Maximal assistance   Toilet Transfer: Minimal assistance;Ambulation;RW;Comfort height toilet;Grab bars;Cueing for safety   Toileting- Clothing Manipulation and Hygiene: Moderate assistance;Sit to/from stand       Functional mobility during ADLs: Minimal assistance;Rolling walker;Cueing for safety General ADL Comments: initiated ADL A/E education with pt and her husband     Vision Baseline Vision/History: Wears glasses Patient Visual Report: No change from baseline       Perception     Praxis      Pertinent Vitals/Pain Pain Assessment: 0-10 Pain Score: 5  Pain Descriptors / Indicators: Aching;Operative site guarding;Grimacing;Guarding;Discomfort Pain Intervention(s): Monitored during session;Premedicated before session;Limited activity within patient's tolerance;Repositioned     Hand Dominance Right   Extremity/Trunk Assessment Upper Extremity Assessment Upper Extremity Assessment: Overall WFL for tasks assessed   Lower Extremity Assessment Lower Extremity Assessment: Defer to PT evaluation   Cervical / Trunk Assessment Cervical / Trunk Assessment: Other exceptions Cervical / Trunk Exceptions: s/p abdominal surgery; colostomy   Communication Communication Communication: No difficulties   Cognition Arousal/Alertness: Awake/alert Behavior During Therapy: [facility]  for tasks  assessed/performed Overall Cognitive Status: Within Functional Limits for tasks assessed                                     General Comments       Exercises     Shoulder Instructions      Home Living Family/patient expects to be discharged to:: Private residence Living Arrangements: Spouse/significant other Available Help at Discharge: Family;Available 24 hours/day Type of Home: House Home Access: Stairs to enter [employer] of Steps: 5 Entrance Stairs-Rails: Right;Left Home Layout: One level     Bathroom Shower/Tub: Teacher, early years/pre: Standard     Home Equipment: None          Prior Functioning/Environment Level of Independence: Independent                 OT Problem List: Impaired balance (sitting and/or standing);Pain;Decreased activity tolerance;Decreased knowledge of use of DME or AE      OT Treatment/Interventions: Self-care/ADL training;DME and/or AE instruction;Therapeutic activities;Patient/family education;Energy conservation    OT Goals(Current goals can be found in the care plan section) Acute Rehab OT Goals Patient Stated Goal: to decrease pain OT Goal Formulation: With patient/family Time For Goal Achievement: 04/06/21 Potential to Achieve Goals: Good ADL Goals Pt Will Perform Grooming: with supervision;with set-up;standing;with caregiver independent in assisting Pt Will Perform Upper Body Bathing: with set-up;sitting;with caregiver independent in assisting Pt Will Perform Lower Body Bathing: with mod assist;with min assist;with caregiver independent in assisting;with adaptive equipment Pt Will Perform Upper Body Dressing: with set-up;sitting Pt Will Perform Lower Body Dressing: with mod assist;with min assist;with caregiver independent in assisting;with adaptive equipment Pt Will Transfer to Toilet: with min guard assist;with supervision;ambulating Pt Will Perform Toileting - Clothing Manipulation  and hygiene: with min assist;with min guard assist;with supervision;sit to/from stand;with caregiver independent in assisting  OT Frequency: Min 2X/week   Barriers to D/C:            Co-evaluation              AM-PAC OT "6 Clicks" Daily Activity     Outcome Measure Help from another person eating meals?: None Help from another person taking care of personal grooming?: A Little Help from another person toileting, which includes using toliet, bedpan, or urinal?: A Lot Help from another person bathing (including washing, rinsing, drying)?: A Lot Help from another person to put on and taking off regular upper body clothing?: A Little Help from another person to put on and taking off regular lower body clothing?: A Lot 6 Click Score: 16   End of Session Equipment Utilized During Treatment: Gait belt;Rolling walker Nurse Communication: Mobility status  Activity Tolerance: Patient limited by fatigue;Patient limited by pain Patient left: in chair;with call bell/phone within reach;with family/visitor present  OT Visit Diagnosis: Unsteadiness on feet (R26.81);Pain;Muscle weakness (generalized) (M62.81) Pain - part of body:  (surgical site)                Time: 1356-1430 OT Time Calculation (min): 34 min Charges:  OT General Charges $OT Visit: 1 Visit OT Evaluation $OT Eval Moderate Complexity: 1 Mod OT Treatments $Self Care/Home Management : 8-22 mins    Britt Bottom 03/23/2021, 2:55 PM

## 2021-03-24 LAB — PHOSPHORUS: Phosphorus: 3.7 mg/dL (ref 2.5–4.6)

## 2021-03-24 LAB — CBC
HCT: 25.5 % — ABNORMAL LOW (ref 36.0–46.0)
Hemoglobin: 8.2 g/dL — ABNORMAL LOW (ref 12.0–15.0)
MCH: 27.9 pg (ref 26.0–34.0)
MCHC: 32.2 g/dL (ref 30.0–36.0)
MCV: 86.7 fL (ref 80.0–100.0)
Platelets: 495 10*3/uL — ABNORMAL HIGH (ref 150–400)
RBC: 2.94 MIL/uL — ABNORMAL LOW (ref 3.87–5.11)
RDW: 15.4 % (ref 11.5–15.5)
WBC: 20.4 10*3/uL — ABNORMAL HIGH (ref 4.0–10.5)
nRBC: 0.1 % (ref 0.0–0.2)

## 2021-03-24 LAB — GLUCOSE, CAPILLARY
Glucose-Capillary: 126 mg/dL — ABNORMAL HIGH (ref 70–99)
Glucose-Capillary: 130 mg/dL — ABNORMAL HIGH (ref 70–99)
Glucose-Capillary: 143 mg/dL — ABNORMAL HIGH (ref 70–99)

## 2021-03-24 LAB — BASIC METABOLIC PANEL
Anion gap: 7 (ref 5–15)
BUN: 10 mg/dL (ref 8–23)
CO2: 29 mmol/L (ref 22–32)
Calcium: 8.6 mg/dL — ABNORMAL LOW (ref 8.9–10.3)
Chloride: 101 mmol/L (ref 98–111)
Creatinine, Ser: 0.67 mg/dL (ref 0.44–1.00)
GFR, Estimated: >60 mL/min (ref >60)
Glucose, Bld: 131 mg/dL — ABNORMAL HIGH (ref 70–99)
Potassium: 4.1 mmol/L (ref 3.5–5.1)
Sodium: 137 mmol/L (ref 135–145)

## 2021-03-24 LAB — SURGICAL PATHOLOGY

## 2021-03-24 LAB — MAGNESIUM: Magnesium: 2 mg/dL (ref 1.7–2.4)

## 2021-03-24 MED ORDER — TRAVASOL 10 % IV SOLN
INTRAVENOUS | Status: AC
  Filled 2021-03-24: qty 960

## 2021-03-24 MED ORDER — BOOST / RESOURCE BREEZE PO LIQD CUSTOM
1.0000 | Freq: Three times a day (TID) | ORAL | Status: DC
  Administered 2021-03-24: 1 via ORAL

## 2021-03-24 NOTE — Consult Note (Signed)
[place] Nurse ostomy follow up Patient receiving care in [place]. Today the pouch that was placed 2 days ago had only a scant amount of yellow liquid in it, and was intact without signs of impending leakage. The patient reports the surgical PA "purged" the pouch of gas when she was in the room. I did not change the pouching system, but did a teaching session using all parts of the pouching system. The patient is able to open/close a pouch, snap the pouch onto the skin barrier, and cut the skin barrier the correct size and shape needed. The patient stated correctly to empty the pouch when stool reaches the letters on the pouch, and she stated her understanding that she needs to change it twice a week.  She explains she will begin receiving PO fluids today. Hopefully this and her mobilizing will encourage intestinal passage of stool into the pouch.  She is in agreement that tomorrow she will remove the existing pouch and participate in all the steps required in placement of a new pouch.  Supplies and pattern are in the room. Val Riles, RN, MSN, CWOCN, CNS-BC, pager [phone]

## 2021-03-24 NOTE — TOC Initial Note (Signed)
Transition of Care [facility]) - Initial/Assessment Note    Patient Details  Name: Regina Roberts MRN: [id number] Date of Birth: 1951-02-09  [facility] CM/SW Contact:    Marilu Favre, RN Phone Number: 03/24/2021, 1:45 PM  Clinical Narrative:                  Spoke to patient at bedside. Confirmed face sheet information. Patient from home with husband. Discussed home health PT and RN. Explained prior to discharge from hospital nurses will teach her and her husband dressing changes and ostomy care, because home health RN will not be able to visit daily. Patient voiced understanding.   PT/OT recommended walker and 3 in1 and tub shower bench . Patient in agreement. NCM will order through [place]. [employer] will discuss insurance coverage/ payment directly with patient. Patient voiced understanding.  Expected Discharge Plan: [place]     Patient Goals and CMS Choice Patient states their goals for this hospitalization and ongoing recovery are:: to return to home CMS Medicare.gov Compare Post Acute Care list provided to:: Patient Choice offered to / list presented to : Patient  Expected Discharge Plan and Services Expected Discharge Plan: [place]   Discharge Planning Services: CM Consult Post Acute Care Choice: Home Health,Durable Medical Equipment Living arrangements for the past 2 months: Single Family Home                 DME Arranged: 3-N-1,Tub bench,Walker rolling DME Agency: [employer] Date DME Agency Contacted: 03/24/21 Time DME Agency Contacted: [phone] Representative spoke with at DME Agency: Freda Munro HH Arranged: RN,PT [place] Agency: [place] Date [place]: 03/24/21 Time [place]: 9741 Representative spoke with at [place]  Prior Living Arrangements/Services Living arrangements for the past 2 months: [place] with:: Spouse Patient language and need  for interpreter reviewed:: Yes Do you feel safe going back to the place where you live?: Yes      Need for Family Participation in Patient Care: Yes (Comment) Care giver support system in place?: Yes (comment)   Criminal Activity/Legal Involvement Pertinent to Current Situation/Hospitalization: No - Comment as needed  Activities of Daily Living      Permission Sought/Granted   Permission granted to share information with : Yes, Verbal Permission Granted     Permission granted to share info w AGENCY: [name] and [employer]        Emotional Assessment Appearance:: Appears stated age Attitude/Demeanor/Rapport: Engaged Affect (typically observed): Accepting Orientation: : Oriented to Self,Oriented to Place,Oriented to  Time,Oriented to Situation Alcohol / Substance Use: Not Applicable Psych Involvement: No (comment)  Admission diagnosis:  Diverticulitis [K57.92] Perforated viscus [R19.8] Acute diverticulitis [K57.92] Patient Active Problem List   Diagnosis Date Noted  . Diverticulitis 03/13/2021  . Frequency of urination 03/08/2021  . Decreased GFR 09/10/2020  . Neoplasm of uncertain behavior of skin 09/07/2020  . Well adult exam 09/07/2020  . Hyperglycemia 09/07/2020  . GERD (gastroesophageal reflux disease) 02/20/2019  . Tachycardia 05/21/2018  . Cough 11/01/2017  . Allergic rhinitis 11/01/2017  . Ulnar nerve neuropathy 05/16/2017  . Raynaud phenomenon 02/01/2017  . Obesity, Class II, BMI 35-39.9 06/09/2015  . Dizziness 03/09/2015  . Swelling of both hands 09/08/2014  . SHOULDER PAIN 05/18/2010  . Edema 05/18/2010  . GRANULOMA, INFECTIOUS 05/15/2009  . Dyslipidemia 07/16/2008  . MENOPAUSAL SYNDROME 07/16/2008  . LOW BACK PAIN 07/16/2008  . Essential  hypertension 07/16/2008   PCP:  Cassandria Anger, MD Pharmacy:   [facility] [place], [place] - [address] [place]  [address] Phone: [phone] Fax: [phone]     Social Determinants of Health (SDOH) Interventions    Readmission Risk Interventions No flowsheet data found.

## 2021-03-24 NOTE — Progress Notes (Signed)
Progress Note  4 Days Post-Op  Subjective: Patient reports a lot of gas from stoma overnight. Pain well controlled. Denies nausea. Tolerating sips and ice chips. Mobilizing well.   Objective: Vital signs in last 24 hours: Temp:  [98.2 F (36.8 C)-99.1 F (37.3 C)] 99.1 F (37.3 C) (06/01 0531) Pulse Rate:  [86-99] 99 (06/01 0531) Resp:  [17-20] 17 (06/01 0531) BP: (146-164)/(84-99) 164/94 (06/01 0531) SpO2:  [93 %-99 %] 95 % (06/01 0531) Last BM Date: 03/12/21  Intake/Output from previous day: 05/31 0701 - 06/01 0700 In: 928  Out: -  Intake/Output this shift: Total I/O In: 0  Out: 300 [Urine:300]  PE: General: pleasant, WD, overweight female who is laying in bed in NAD Heart: regular, rate, and rhythm.   Lungs: CTAB, no wheezes, rhonchi, or rales noted.  Respiratory effort nonlabored Abd: soft, appropriately ttp, mildly distended, BS hypoactive, midline wound appears clean, stoma viable with gas in bag   Lab Results:  Recent Labs    03/23/21 1110 03/24/21 0237  WBC 26.1* 20.4*  HGB 9.1* 8.2*  HCT 27.1* 25.5*  PLT 509* 495*   BMET Recent Labs    03/23/21 0246 03/24/21 0237  NA 140 137  K 3.6 4.1  CL 105 101  CO2 28 29  GLUCOSE 129* 131*  BUN 9 10  CREATININE 0.67 0.67  CALCIUM 8.1* 8.6*   PT/INR No results for input(s): LABPROT, INR in the last 72 hours. CMP     Component Value Date/Time   NA 137 03/24/2021 0237   K 4.1 03/24/2021 0237   CL 101 03/24/2021 0237   CO2 29 03/24/2021 0237   GLUCOSE 131 (H) 03/24/2021 0237   BUN 10 03/24/2021 0237   CREATININE 0.67 03/24/2021 0237   CALCIUM 8.6 (L) 03/24/2021 0237   PROT 5.6 (L) 03/22/2021 0334   ALBUMIN 2.0 (L) 03/22/2021 0334   AST 15 03/22/2021 0334   ALT 37 03/22/2021 0334   ALKPHOS 52 03/22/2021 0334   BILITOT 0.5 03/22/2021 0334   GFRNONAA >60 03/24/2021 0237   Lipase     Component Value Date/Time   LIPASE 21 03/12/2021 1959       Studies/Results: No results  found.  Anti-infectives: Anti-infectives (From admission, onward)   Start     Dose/Rate Route Frequency Ordered Stop   03/20/21 0715  cefoTEtan (CEFOTAN) 2 g in sodium chloride 0.9 % 100 mL IVPB        2 g 200 mL/hr over 30 Minutes Intravenous On call to O.R. 03/20/21 3382 03/20/21 0951   03/19/21 1400  ertapenem (INVANZ) 1,000 mg in sodium chloride 0.9 % 100 mL IVPB  Status:  Discontinued        1 g 200 mL/hr over 30 Minutes Intravenous Every 24 hours 03/19/21 1224 03/24/21 0751   03/18/21 1200  amoxicillin-clavulanate (AUGMENTIN) 875-125 MG per tablet 1 tablet  Status:  Discontinued        1 tablet Oral Every 12 hours 03/18/21 0759 03/19/21 1224   03/13/21 1400  piperacillin-tazobactam (ZOSYN) IVPB 3.375 g  Status:  Discontinued        3.375 g 12.5 mL/hr over 240 Minutes Intravenous Every 8 hours 03/13/21 0757 03/18/21 0759   03/13/21 0615  piperacillin-tazobactam (ZOSYN) IVPB 3.375 g        3.375 g 12.5 mL/hr over 240 Minutes Intravenous  Once 03/13/21 0604 03/13/21 1247       Assessment/Plan HTN HLD GERD LUE edema -improving, [place] 5/24 negative for SVT  or DVT Severe Protein calorie malnutrition - prealbumin 7.8 (5/28) and <5 yesterday AM  Perforatedsigmoiddiverticulitis  POD3 s/p exp lap, ileocecectomy, repair SB serosal tears, LOA, colectomy/end colostomy, 5/28 Dr Kae Heller - starting to pass flatus via stoma, start CLD today, add supplements  - WOC following for stoma  - continue BID dressing changes - pulm toilet, IS, encourage mobilization  - WBC 20 this AM, trending down - monitor off abx  - continue TPN until tolerating FLD then will dc  FEN: CLD, TPN, IVF KVO, SSI VTE: lovenox ID: Zosyn 5/21>5/26; PO Augmentin 5/26>5/27, Ertapenem 5/27>5/31  LOS: 11 days    [name], [facility] 03/24/2021, 8:25 AM Please see Amion for pager number during day hours 7:00am-4:30pm

## 2021-03-24 NOTE — Progress Notes (Addendum)
PHARMACY - TOTAL PARENTERAL NUTRITION CONSULT NOTE  Indication: Prolonged ileus  Patient Measurements: Height: 4' 11.5" (151.1 cm) Weight: 84.8 kg (186 lb 15.2 oz) IBW/kg (Calculated) : 44.35 TPN AdjBW (KG): 54.5 Body mass index is 37.13 kg/m.  Assessment:  51 YOF presented on 03/12/21 with abdominal pain, found to have perforated sigmoid diverticulitis and treated with conservative measures.  Patient tolerated FLD on 5/25 and then advanced to a soft diet on 5/26.  She soon worsened and repeat CT showed progression with 2 new paracolonic air-fluid collections.  Underwent ex-lap with LoA, colectomy, colostomy on 5/29.  Pharmacy consulted to manage TPN for prolonged ileus.  Glucose / Insulin: no hx DM - CBGs < 180. Used 4 units SSI in past 24 hrs. Electrolytes: all WNL (K up to 4.1 and Phos up to 3.7 post KPhos 9mmol and 4 runs KCL) Renal: SCr < 1, BUN WNL  Hepatic: LFTs / tbili / TG WNL.  Prealbumin down to < 5 likely d/t stress, albumin 2. Intake / Output; MIVF: urinated x7, net +15.4L (?accurate) GI Imaging: 5/21 CT: scattered colonic diverticulosis complicated by a perf acute sigmoid diverticulitis 5/24: stable sigmoid diverticulitis  5/27: pneumoperitoneum consistent with perf diverticulitis; interval development of 2 adjacent paradiverticular abscesses GI Surgeries / Procedures:  5/28: ex-lap with LoA, sigmoid colectomy, mobilization of splenic flexure, creation of end colostomy, ileocecectomy with ileocolonic anastomosis  Central access: PICC placed 03/21/21 TPN start date: 03/21/21  Nutritional Goals (RD rec on 5/30): 1770-1980 kCal, 90-100g protein, >/= 1.8L fluid per day  Current Nutrition:  TPN  Plan:  Increase TPN to goal rate 80 ml/hr to provide 96g AA, 259g CHO and 56g ILE for a total of 1821 kCal, meeting 100% of patient's needs Electrolytes in TPN: increase Na to 63mEq/L, K 71mEq/L, Ca 67mEq/L, Mg 35mEq/L, Phos 25mmol/L, change Cl:Ac to 2:1 Add standard MVI and trace  elements to TPN Continue sensitive SSI Q6H - D/C if CBGs remain controlled at goal TPN rate F/U AM labs, volume status  Stop Invanz as it is >4 days post-op per Surgery's plan  Kerrilyn Azbill D. Mina Marble, PharmD, BCPS, [place] 03/24/2021, 7:45 AM

## 2021-03-24 NOTE — Progress Notes (Signed)
Physical Therapy Treatment Patient Details Name: Regina Roberts MRN: [id number] DOB: 04-18-1951 Today's Date: 03/24/2021    History of Present Illness Pt is a 70 y/o female admitted secondary to worsening abdominal pain on 5/20. Found to have perforated sigmoid diverticulitis and is s/p exploratory laparotomy with colostomy creation on 5/28. PMH includes HTN and hysterectomy.    PT Comments    Pt walking slowly around the room with support of therapist and IV pole, using her free hand to reach for furniture for support indicating she may do better with RW for gait progression.  Minimal assist needed for sponge bathing seated and standing in bathroom and pt assisted back to bed as she had been up in the chair most of the morning. She remains appropriate for home therapy at discharge.  Will attempt to progress gait to the hallway next session and encourage RW use.  PT will continue to follow acutely for safe mobility progression.  Would also benefit from LE seated HEP.    Follow Up Recommendations  Home health PT;Supervision for mobility/OOB     Equipment Recommendations  Rolling walker with 5" wheels;3in1 (PT)    Recommendations for Other Services       Precautions / Restrictions Precautions Precautions: Fall;Other (comment) Precaution Comments: colostomy, abdomen    Mobility  Bed Mobility Overal bed mobility: Needs Assistance Bed Mobility: Sit to Supine       Sit to supine: Mod assist;HOB elevated   General bed mobility comments: Mod assist to help lift both legs back into bed from sitting.    Transfers Overall transfer level: Needs assistance Equipment used: 1 person hand held assist Transfers: Sit to/from Stand Sit to Stand: Min guard         General transfer comment: Min guard assist for safety.  Ambulation/Gait Ambulation/Gait assistance: Min guard Gait Distance (Feet): 25 Feet Assistive device: IV Pole Gait Pattern/deviations: Step-through  pattern;Decreased stride length Gait velocity: decreased Gait velocity interpretation: <1.31 ft/sec, indicative of household ambulator General Gait Details: Pt with slow guarded gait, holding IV pole for support and reaching with free hand for furniture for support in room.  Likely would benefit from RW for gait progression.   Stairs             Wheelchair Mobility    Modified Rankin (Stroke Patients Only)       Balance Overall balance assessment: Needs assistance Sitting-balance support: Feet supported;No upper extremity supported Sitting balance-Leahy Scale: Good     Standing balance support: Bilateral upper extremity supported;No upper extremity supported;Single extremity supported Standing balance-Leahy Scale: Fair Standing balance comment: props with one hand on sink for peri care while sponge bathing.                            Cognition Arousal/Alertness: Awake/alert Behavior During Therapy: WFL for tasks assessed/performed Overall Cognitive Status: Within Functional Limits for tasks assessed                                        Exercises General Exercises - Lower Extremity Ankle Circles/Pumps: AROM;Both;20 reps (encouraged hourly since she did not want SCDs when returning to bed.)    General Comments General comments (skin integrity, edema, etc.): PT assised and helped supervise sponge bathing after short distance gait.  Pt able to do most herself with help to get lower legs  and back.  She did most of the bath in sitting, but then stood to clean her genitals and to brush her teeth.      Pertinent Vitals/Pain Pain Assessment: Faces Faces Pain Scale: Hurts whole lot Pain Location: incisional Pain Descriptors / Indicators: Grimacing;Guarding Pain Intervention(s): Limited activity within patient's tolerance;Monitored during session;Repositioned    Home Living                      Prior Function            PT Goals  (current goals can now be found in the care plan section) Acute Rehab PT Goals Patient Stated Goal: to decrease pain Progress towards PT goals: Progressing toward goals    Frequency    Min 3X/week      PT Plan Current plan remains appropriate    Co-evaluation              AM-PAC PT "6 Clicks" Mobility   Outcome Measure  Help needed turning from your back to your side while in a flat bed without using bedrails?: A Little Help needed moving from lying on your back to sitting on the side of a flat bed without using bedrails?: A Little Help needed moving to and from a bed to a chair (including a wheelchair)?: A Little Help needed standing up from a chair using your arms (e.g., wheelchair or bedside chair)?: A Little Help needed to walk in hospital room?: A Little Help needed climbing 3-5 steps with a railing? : A Little 6 Click Score: 18    End of Session   Activity Tolerance: Patient limited by pain;Patient limited by fatigue Patient left: in bed;with call bell/phone within reach   PT Visit Diagnosis: Unsteadiness on feet (R26.81);Muscle weakness (generalized) (M62.81);Difficulty in walking, not elsewhere classified (R26.2);Pain Pain - Right/Left:  (incisional) Pain - part of body:  (abdomen)     Time: 8333-8329 PT Time Calculation (min) (ACUTE ONLY): 30 min  Charges:  $Gait Training: 8-22 mins $Therapeutic Activity: 8-22 mins                     Verdene Lennert, PT, DPT  Acute Rehabilitation Ortho Tech Supervisor [phone] pager [phone]) [phone] office

## 2021-03-25 LAB — CBC
HCT: 26 % — ABNORMAL LOW (ref 36.0–46.0)
Hemoglobin: 8.3 g/dL — ABNORMAL LOW (ref 12.0–15.0)
MCH: 27.8 pg (ref 26.0–34.0)
MCHC: 31.9 g/dL (ref 30.0–36.0)
MCV: 87 fL (ref 80.0–100.0)
Platelets: 532 10*3/uL — ABNORMAL HIGH (ref 150–400)
RBC: 2.99 MIL/uL — ABNORMAL LOW (ref 3.87–5.11)
RDW: 15.4 % (ref 11.5–15.5)
WBC: 18.3 10*3/uL — ABNORMAL HIGH (ref 4.0–10.5)
nRBC: 0.2 % (ref 0.0–0.2)

## 2021-03-25 LAB — COMPREHENSIVE METABOLIC PANEL
ALT: 24 U/L (ref 0–44)
AST: 14 U/L — ABNORMAL LOW (ref 15–41)
Albumin: 2.1 g/dL — ABNORMAL LOW (ref 3.5–5.0)
Alkaline Phosphatase: 54 U/L (ref 38–126)
Anion gap: 5 (ref 5–15)
BUN: 14 mg/dL (ref 8–23)
CO2: 28 mmol/L (ref 22–32)
Calcium: 8.5 mg/dL — ABNORMAL LOW (ref 8.9–10.3)
Chloride: 103 mmol/L (ref 98–111)
Creatinine, Ser: 0.69 mg/dL (ref 0.44–1.00)
GFR, Estimated: >60 mL/min (ref >60)
Glucose, Bld: 138 mg/dL — ABNORMAL HIGH (ref 70–99)
Potassium: 4.3 mmol/L (ref 3.5–5.1)
Sodium: 136 mmol/L (ref 135–145)
Total Bilirubin: 0.3 mg/dL (ref 0.3–1.2)
Total Protein: 5.6 g/dL — ABNORMAL LOW (ref 6.5–8.1)

## 2021-03-25 LAB — GLUCOSE, CAPILLARY
Glucose-Capillary: 137 mg/dL — ABNORMAL HIGH (ref 70–99)
Glucose-Capillary: 143 mg/dL — ABNORMAL HIGH (ref 70–99)
Glucose-Capillary: 143 mg/dL — ABNORMAL HIGH (ref 70–99)
Glucose-Capillary: 155 mg/dL — ABNORMAL HIGH (ref 70–99)

## 2021-03-25 LAB — MAGNESIUM: Magnesium: 2 mg/dL (ref 1.7–2.4)

## 2021-03-25 LAB — PHOSPHORUS: Phosphorus: 3.9 mg/dL (ref 2.5–4.6)

## 2021-03-25 MED ORDER — TRAVASOL 10 % IV SOLN
INTRAVENOUS | Status: AC
  Filled 2021-03-25: qty 960

## 2021-03-25 MED ORDER — ENSURE SURGERY PO LIQD
237.0000 mL | Freq: Three times a day (TID) | ORAL | Status: DC
  Administered 2021-03-25 (×3): 237 mL via ORAL
  Filled 2021-03-25 (×5): qty 237

## 2021-03-25 MED ORDER — OXYCODONE HCL 5 MG PO TABS
5.0000 mg | ORAL_TABLET | ORAL | Status: DC | PRN
  Administered 2021-03-26: 10 mg via ORAL
  Administered 2021-03-26 – 2021-03-27 (×6): 5 mg via ORAL
  Administered 2021-03-28: 10 mg via ORAL
  Administered 2021-03-28 (×2): 5 mg via ORAL
  Administered 2021-03-28 – 2021-03-30 (×7): 10 mg via ORAL
  Filled 2021-03-25 (×3): qty 2
  Filled 2021-03-25 (×3): qty 1
  Filled 2021-03-25 (×2): qty 2
  Filled 2021-03-25 (×4): qty 1
  Filled 2021-03-25 (×4): qty 2
  Filled 2021-03-25 (×2): qty 1

## 2021-03-25 MED ORDER — ACETAMINOPHEN 325 MG PO TABS
650.0000 mg | ORAL_TABLET | Freq: Four times a day (QID) | ORAL | Status: DC
  Administered 2021-03-25: 650 mg via ORAL
  Filled 2021-03-25 (×6): qty 2

## 2021-03-25 MED ORDER — METHOCARBAMOL 500 MG PO TABS
500.0000 mg | ORAL_TABLET | Freq: Three times a day (TID) | ORAL | Status: DC
  Administered 2021-03-25 – 2021-03-30 (×17): 500 mg via ORAL
  Filled 2021-03-25 (×17): qty 1

## 2021-03-25 NOTE — Progress Notes (Signed)
PHARMACY - TOTAL PARENTERAL NUTRITION CONSULT NOTE  Indication: Prolonged ileus  Patient Measurements: Height: 4' 11.5" (151.1 cm) Weight: 84.8 kg (186 lb 15.2 oz) IBW/kg (Calculated) : 44.35 TPN AdjBW (KG): 54.5 Body mass index is 37.13 kg/m.  Assessment:  58 YOF presented on 03/12/21 with abdominal pain, found to have perforated sigmoid diverticulitis and treated with conservative measures.  Patient tolerated FLD on 5/25 and then advanced to a soft diet on 5/26.  She soon worsened and repeat CT showed progression with 2 new paracolonic air-fluid collections.  Underwent ex-lap with LoA, colectomy, colostomy on 5/29.  Pharmacy consulted to manage TPN for prolonged ileus.  Glucose / Insulin: no hx DM - CBGs < 180. Used 4 units SSI in past 24 hrs. Electrolytes: all WNL Renal: SCr < 1, BUN WNL  Hepatic: LFTs / tbili / TG WNL.  Prealbumin down to < 5 likely d/t stress, albumin 2.1 Intake / Output; MIVF: UOP 1.1 ml/kg/hr, net +14.6L (UOP not charted some days) GI Imaging: 5/21 CT: scattered colonic diverticulosis complicated by a perf acute sigmoid diverticulitis 5/24: stable sigmoid diverticulitis  5/27: pneumoperitoneum consistent with perf diverticulitis; interval development of 2 adjacent paradiverticular abscesses GI Surgeries / Procedures:  5/28: ex-lap with LoA, sigmoid colectomy, mobilization of splenic flexure, creation of end colostomy, ileocecectomy with ileocolonic anastomosis  Central access: PICC placed 03/21/21 TPN start date: 03/21/21  Nutritional Goals (RD rec on 5/30): 1770-1980 kCal, 90-100g protein, >/= 1.8L fluid per day  Current Nutrition:  TPN CLD, to advance to FLD 6/2 Resource Breeze TID - 1 charted given yesterday  Plan:  Continue TPN at goal rate 80 ml/hr to provide 96g AA, 259g CHO and 56g ILE for a total of 1821 kCal, meeting 100% of patient's needs Electrolytes in TPN: increase Na to 69mEq/L, K 60mEq/L, Ca 33mEq/L, Mg 80mEq/L, Phos 9mmol/L, change Cl:Ac to  2:1 on 6/1 Add standard MVI and trace elements to TPN D/C SSI/CBG checks Standard TPN labs on Mon/Thurs, recheck over weekend if still on TPN F/u PO intake/diet advancement to wean TPN  Raimi Guillermo D. Mina Marble, PharmD, BCPS, [place] 03/25/2021, 9:39 AM

## 2021-03-25 NOTE — Consult Note (Signed)
[place] Nurse ostomy follow up Stoma type/location: LMQ colostomy 3 cm from midline abdominal incision Stomal assessment/size: 1" pink and moist  Slightly budded.  Peristomal assessment: creasing at 3 and 9 o'clock, use barrier ring Treatment options for stomal/peristomal skin: barrier ring 2 piece pouch Output  Blood tinged liquid Ostomy pouching: 2pc. 2 3/4" pouch with barrier ring Education provided:  Patient performs pouch change today. Removes old pouch, cuts near barrier and assembles together.  She applies barrier ring and pouch with minimal assistance.  She is feeling more confident in ostomy care.  Still no stool output, she indicates that her diet is being increased today.  Enrolled patient in [place] Start Discharge program: Yes Will follow.  Domenic Moras MSN, RN, FNP-BC CWON Wound, Ostomy, Continence Nurse Pager [phone]

## 2021-03-25 NOTE — Progress Notes (Signed)
Occupational Therapy Treatment Patient Details Name: Katreena Schupp MRN: [id number] DOB: [birth year] Today's Date: 03/25/2021    History of present illness Pt is a 70 y/o female admitted secondary to worsening abdominal pain on 5/20. Found to have perforated sigmoid diverticulitis and is s/p exploratory laparotomy with colostomy creation on 5/28. PMH includes HTN and hysterectomy.   OT comments  Pt making great progress towards OT goals. This session pt required no physical assistance with any ADL, mobility, or transfers, only supervision to min guard for safety due to balance concerns. Pt motivated to keep moving, reporting she has gone on multiple walks today already. Pt is tolerating her pain levels well and is able to practice pursed lip breathing to help her relieve the pain. Acute OT will continue to follow up to assist with progressing pt's goals.    Follow Up Recommendations  Home health OT    Equipment Recommendations  3 in 1 bedside commode;Tub/shower seat;Other (comment)    Recommendations for Other Services      Precautions / Restrictions Precautions Precautions: Fall;Other (comment) Precaution Comments: colostomy, abdomen Restrictions Weight Bearing Restrictions: No       Mobility Bed Mobility Overal bed mobility: Modified Independent             General bed mobility comments: HOB elevated, no physical assist    Transfers Overall transfer level: Needs assistance Equipment used: None Transfers: Sit to/from Stand Sit to Stand: Min guard         General transfer comment: Pt completed 3 sit<>stands from bed, recliner, and toilet, no physical assist needed, min guard for safety    Balance Overall balance assessment: Mild deficits observed, not formally tested                                         ADL either performed or assessed with clinical judgement   ADL Overall ADL's : Needs assistance/impaired     Grooming: Wash/dry  hands;Standing;Supervision/safety Grooming Details (indicate cue type and reason): completed at sink         Upper Body Dressing : Set up;Sitting Upper Body Dressing Details (indicate cue type and reason): Pt donned back gown     Toilet Transfer: Supervision/safety;Ambulation Toilet Transfer Details (indicate cue type and reason): completed on comfort height toilet Toileting- Clothing Manipulation and Hygiene: Supervision/safety;Sitting/lateral lean;Sit to/from stand Toileting - Clothing Manipulation Details (indicate cue type and reason): sup for safety     Functional mobility during ADLs: Supervision/safety;Min guard General ADL Comments: Pt ambulated in hall this session, which required min guard due to activitytolerance. In the room, pt completed toileting with supervision after a rest break.     Vision       Perception     Praxis      Cognition Arousal/Alertness: Awake/alert Behavior During Therapy: WFL for tasks assessed/performed Overall Cognitive Status: Within Functional Limits for tasks assessed                                          Exercises     Shoulder Instructions       General Comments VSS on RA    Pertinent Vitals/ Pain       Pain Assessment: No/denies pain  Home Living  Prior Functioning/Environment              Frequency  Min 2X/week        Progress Toward Goals  OT Goals(current goals can now be found in the care plan section)  Progress towards OT goals: Progressing toward goals  Acute Rehab OT Goals Patient Stated Goal: to go home OT Goal Formulation: With patient Time For Goal Achievement: 04/06/21 Potential to Achieve Goals: Good ADL Goals Pt Will Perform Grooming: with modified independence;standing Pt Will Perform Upper Body Bathing: with set-up;sitting;with caregiver independent in assisting Pt Will Perform Lower Body Bathing: with mod  assist;with min assist;with caregiver independent in assisting;with adaptive equipment Pt Will Perform Upper Body Dressing: with modified independence;sitting Pt Will Perform Lower Body Dressing: with mod assist;with min assist;with caregiver independent in assisting;with adaptive equipment Pt Will Transfer to Toilet: with modified independence;ambulating Pt Will Perform Toileting - Clothing Manipulation and hygiene: with modified independence;sitting/lateral leans;sit to/from stand  Plan Discharge plan remains appropriate;Frequency remains appropriate    Co-evaluation                 AM-PAC OT "6 Clicks" Daily Activity     Outcome Measure   Help from another person eating meals?: None Help from another person taking care of personal grooming?: A Little Help from another person toileting, which includes using toliet, bedpan, or urinal?: A Little Help from another person bathing (including washing, rinsing, drying)?: A Little Help from another person to put on and taking off regular upper body clothing?: A Little Help from another person to put on and taking off regular lower body clothing?: A Lot 6 Click Score: 18    End of Session    OT Visit Diagnosis: Unsteadiness on feet (R26.81);Other abnormalities of gait and mobility (R26.89);Muscle weakness (generalized) (M62.81)   Activity Tolerance Patient tolerated treatment well   Patient Left in bed;with call bell/phone within reach   Nurse Communication Mobility status        Time: 5686-1683 OT Time Calculation (min): 28 min  Charges: OT General Charges $OT Visit: 1 Visit OT Treatments $Self Care/Home Management : 8-22 mins $Therapeutic Activity: 8-22 mins  Jazsmin Couse H., OTR/L Acute Rehabilitation  Dericka Ostenson Elane Kimaria Struthers 03/25/2021, 6:41 PM

## 2021-03-25 NOTE — Consult Note (Signed)
WOC Nurse Consult Note: Surgical PA reached out to let me know the [facility] should be started to the abdominal wound Friday 6/3. Reason for Consult: Wound type: Pressure Injury POA: Yes/No/NA Measurement: Wound bed: Drainage (amount, consistency, odor)  Periwound: Dressing procedure/placement/frequency: VAC supplies and machine will be ordered by the [place]. Val Riles, RN, MSN, CWOCN, CNS-BC, pager [phone]

## 2021-03-25 NOTE — Progress Notes (Addendum)
Nutrition Follow-up  DOCUMENTATION CODES:   Not applicable  INTERVENTION:   TPN order per Pharmacy -Recommend continuing TPN until diet advanced to SOFT and pt demonstrating tolerance. Ideally, TPN should continue until pt meeting at least 60% of meals by mouth  Continue Ensure Enlive po TID, each supplement provides 350 kcal and 20 grams of protein  D/C Boost Breeze  Provided "Colostomy Nutrition Therapy" diet handout from [facility] to patient and reviewed recommendations  NUTRITION DIAGNOSIS:   Inadequate oral intake related to altered GI function as evidenced by NPO status.  Being addressed via supplements, TPN, diet advancement  GOAL:   Patient will meet greater than or equal to 90% of their needs  Progressing  MONITOR:   Diet advancement,Labs,Weight trends,I & O's  REASON FOR ASSESSMENT:   Consult New TPN/TNA  ASSESSMENT:   70 yo female admitted with perforated sigmoid diverticulitis, failed medical therapy x 1 week ultimately requiring surgical intervention. PMH includes GERD, HTN, HLD  5/21 Admitted, colonic diverticulosis with oerforated acute sig diverticulitis 5/27 CT 2 new para colonic air-fluid colections 5/28 Ex lap, LOA, sig colectomy, end colostomy, ileocecectomy with ileocolonic anastamosis 5/29 TPN initiated, PICC placed  Pt reports pain continues but appears to be slowly improving. Complains of gas but no N/V.  Pt hast NOT had a BM yet  Pt eating small amounts of CL trays, taking it slowly. Pt likes Ensure Enlive, sipping on one this AM. Pt prefers this to [employer]. As of visit this AM, pt had not tried [place] diet as it had just been ordered  TPN at 80 ml/hr providing 96 g of protein, 1821 kcals meeting 100% estimated calorie and protein needs  Pt reports good appetite, eating well with no loss PTA.   Labs: reviewed Meds: reveiwed    NUTRITION - FOCUSED PHYSICAL EXAM:  Flowsheet Row Most Recent Value  Orbital Region No depletion  Upper Arm  Region No depletion  Thoracic and Lumbar Region No depletion  Buccal Region No depletion  Temple Region No depletion  Clavicle Bone Region No depletion  Clavicle and Acromion Bone Region No depletion  Scapular Bone Region No depletion  Dorsal Hand No depletion  Patellar Region No depletion  Anterior Thigh Region No depletion  Posterior Calf Region No depletion  Edema (RD Assessment) None       Diet Order:   Diet Order            Diet full liquid Room service appropriate? Yes; Fluid consistency: Thin  Diet effective now                 EDUCATION NEEDS:   Not appropriate for education at this time  Skin:  Skin Assessment: Skin Integrity Issues: Skin Integrity Issues:: Incisions Incisions: abdomen, new colostomy  Last BM:  5/20  Height:   Ht Readings from Last 1 Encounters:  03/22/21 4' 11.5" (1.511 m)    Weight:   Wt Readings from Last 1 Encounters:  03/22/21 84.8 kg    BMI:  Body mass index is 37.13 kg/m.  Estimated Nutritional Needs:   Kcal:  1770-1980 kcals  Protein:  90-100 g  Fluid:  >/= 1.8 L  Kerman Passey MS, RDN, LDN, CNSC Registered Dietitian III Clinical Nutrition RD Pager and On-Call Pager Number Located in [place]

## 2021-03-25 NOTE — Progress Notes (Signed)
Progress Note  5 Days Post-Op  Subjective: Patient tolerating CLD without nausea or vomiting. Still passing flatus via stoma but no stool yet. Pain overall well controlled.   Objective: Vital signs in last 24 hours: Temp:  [98.3 F (36.8 C)-99.3 F (37.4 C)] 99.3 F (37.4 C) (06/02 0553) Pulse Rate:  [88-98] 88 (06/02 0553) Resp:  [17-18] 17 (06/02 0553) BP: (139-150)/(71-81) 139/81 (06/02 0553) SpO2:  [95 %-98 %] 95 % (06/02 0553) Last BM Date: 03/12/21  Intake/Output from previous day: 06/01 0701 - 06/02 0700 In: 1514.7 [P.O.:260; I.V.:1254.7] Out: 2325 [Urine:2325] Intake/Output this shift: Total I/O In: 3 [I.V.:3] Out: 450 [Urine:450]  PE: General: pleasant, WD,overweightfemale who is laying in bed in NAD Heart: regular, rate, and rhythm.  Lungs: CTAB, no wheezes, rhonchi, or rales noted. Respiratory effort nonlabored Abd: soft,appropriately ttp,mildly distended,BS hypoactive,midline wound appears clean, stoma viable with gas in bag   Lab Results:  Recent Labs    03/24/21 0237 03/25/21 0336  WBC 20.4* 18.3*  HGB 8.2* 8.3*  HCT 25.5* 26.0*  PLT 495* 532*   BMET Recent Labs    03/24/21 0237 03/25/21 0336  NA 137 136  K 4.1 4.3  CL 101 103  CO2 29 28  GLUCOSE 131* 138*  BUN 10 14  CREATININE 0.67 0.69  CALCIUM 8.6* 8.5*   PT/INR No results for input(s): LABPROT, INR in the last 72 hours. CMP     Component Value Date/Time   NA 136 03/25/2021 0336   K 4.3 03/25/2021 0336   CL 103 03/25/2021 0336   CO2 28 03/25/2021 0336   GLUCOSE 138 (H) 03/25/2021 0336   BUN 14 03/25/2021 0336   CREATININE 0.69 03/25/2021 0336   CALCIUM 8.5 (L) 03/25/2021 0336   PROT 5.6 (L) 03/25/2021 0336   ALBUMIN 2.1 (L) 03/25/2021 0336   AST 14 (L) 03/25/2021 0336   ALT 24 03/25/2021 0336   ALKPHOS 54 03/25/2021 0336   BILITOT 0.3 03/25/2021 0336   GFRNONAA >60 03/25/2021 0336   Lipase     Component Value Date/Time   LIPASE 21 03/12/2021 1959        Studies/Results: No results found.  Anti-infectives: Anti-infectives (From admission, onward)   Start     Dose/Rate Route Frequency Ordered Stop   03/20/21 0715  cefoTEtan (CEFOTAN) 2 g in sodium chloride 0.9 % 100 mL IVPB        2 g 200 mL/hr over 30 Minutes Intravenous On call to O.R. 03/20/21 2409 03/20/21 0951   03/19/21 1400  ertapenem (INVANZ) 1,000 mg in sodium chloride 0.9 % 100 mL IVPB  Status:  Discontinued        1 g 200 mL/hr over 30 Minutes Intravenous Every 24 hours 03/19/21 1224 03/24/21 0751   03/18/21 1200  amoxicillin-clavulanate (AUGMENTIN) 875-125 MG per tablet 1 tablet  Status:  Discontinued        1 tablet Oral Every 12 hours 03/18/21 0759 03/19/21 1224   03/13/21 1400  piperacillin-tazobactam (ZOSYN) IVPB 3.375 g  Status:  Discontinued        3.375 g 12.5 mL/hr over 240 Minutes Intravenous Every 8 hours 03/13/21 0757 03/18/21 0759   03/13/21 0615  piperacillin-tazobactam (ZOSYN) IVPB 3.375 g        3.375 g 12.5 mL/hr over 240 Minutes Intravenous  Once 03/13/21 0604 03/13/21 1247       Assessment/Plan Perforatedsigmoiddiverticulitis POD5s/p exp lap, ileocecectomy, repair SB serosal tears, LOA, colectomy/end colostomy, 5/28 Dr Kae Heller - tolerating CLD  and having flatus, advance to FLD with ensure  -WOC following for stoma, discussed placing VAC tomorrow  -continue BID dressing changes today  - pulm toilet, IS, encourage mobilization  - WBC 18 this AM, trending down - continue to monitor off abx  - continue TPN today, will reassess need tomorrow and potentially dc if able to take enough FLD and supplements  FEN:FLD, TPN, IVF KVO, SSI VTE: lovenox ID: Zosyn 5/21>5/26; PO Augmentin 5/26>5/27, Ertapenem 5/27>5/31  HTN HLD GERD LUE edema -resolved, [place] 5/24 negative for SVT or DVT SevereProtein calorie malnutrition - prealbumin 7.8(5/28) and <5 (5/30)  LOS: 12 days    [name], [facility] 03/25/2021, 9:57  AM Please see Amion for pager number during day hours 7:00am-4:30pm

## 2021-03-26 ENCOUNTER — Inpatient Hospital Stay (HOSPITAL_COMMUNITY): Payer: Medicare Other

## 2021-03-26 LAB — URINALYSIS, ROUTINE W REFLEX MICROSCOPIC
Bilirubin Urine: NEGATIVE
Glucose, UA: NEGATIVE mg/dL
Hgb urine dipstick: NEGATIVE
Ketones, ur: NEGATIVE mg/dL
Leukocytes,Ua: NEGATIVE
Nitrite: NEGATIVE
Protein, ur: NEGATIVE mg/dL
Specific Gravity, Urine: 1.013 (ref 1.005–1.030)
pH: 7 (ref 5.0–8.0)

## 2021-03-26 LAB — CBC
HCT: 27.2 % — ABNORMAL LOW (ref 36.0–46.0)
Hemoglobin: 8.8 g/dL — ABNORMAL LOW (ref 12.0–15.0)
MCH: 27.9 pg (ref 26.0–34.0)
MCHC: 32.4 g/dL (ref 30.0–36.0)
MCV: 86.3 fL (ref 80.0–100.0)
Platelets: 584 10*3/uL — ABNORMAL HIGH (ref 150–400)
RBC: 3.15 MIL/uL — ABNORMAL LOW (ref 3.87–5.11)
RDW: 15.5 % (ref 11.5–15.5)
WBC: 21.4 10*3/uL — ABNORMAL HIGH (ref 4.0–10.5)
nRBC: 0.2 % (ref 0.0–0.2)

## 2021-03-26 LAB — GLUCOSE, CAPILLARY
Glucose-Capillary: 145 mg/dL — ABNORMAL HIGH (ref 70–99)
Glucose-Capillary: 149 mg/dL — ABNORMAL HIGH (ref 70–99)

## 2021-03-26 MED ORDER — IOHEXOL 300 MG/ML  SOLN
75.0000 mL | Freq: Once | INTRAMUSCULAR | Status: AC | PRN
  Administered 2021-03-26: 75 mL via INTRAVENOUS

## 2021-03-26 MED ORDER — ENSURE ENLIVE PO LIQD
237.0000 mL | Freq: Three times a day (TID) | ORAL | Status: DC
  Administered 2021-03-26 – 2021-03-30 (×9): 237 mL via ORAL
  Filled 2021-03-26 (×3): qty 237

## 2021-03-26 MED ORDER — IOHEXOL 9 MG/ML PO SOLN
ORAL | Status: AC
  Administered 2021-03-26: 500 mL
  Filled 2021-03-26: qty 1000

## 2021-03-26 MED ORDER — TRAVASOL 10 % IV SOLN
INTRAVENOUS | Status: AC
  Filled 2021-03-26: qty 960

## 2021-03-26 NOTE — Progress Notes (Signed)
Physical Therapy Treatment Patient Details Name: Regina Roberts MRN: [id number] DOB: 1950/12/07 Today's Date: 03/26/2021    History of Present Illness Pt is a 70 y/o female admitted secondary to worsening abdominal pain on 5/20. Found to have perforated sigmoid diverticulitis and is s/p exploratory laparotomy with colostomy creation on 5/28. PMH includes HTN and hysterectomy.    PT Comments    Pt received in supine, agreeable to therapy session and with good participation and tolerance for transfer and gait training. Pt able to progress to household distance ambulation task in hallway without AD, no overt LOB but very slow, effortful and guarded gait, VSS on RA. Pt continues to benefit from PT services to progress toward functional mobility goals. Continue to recommend HHPT, will plan to bring LE HEP and initiate stair training next session.   Follow Up Recommendations  Home health PT;Supervision for mobility/OOB     Equipment Recommendations  Other (comment);3in1 (PT) (may consider cane, pt currently refusing RW)    Recommendations for Other Services       Precautions / Restrictions Precautions Precautions: Fall;Other (comment) Precaution Comments: colostomy, abdomen, wound vac applied 6/3 Restrictions Weight Bearing Restrictions: No    Mobility  Bed Mobility Overal bed mobility: Modified Independent             General bed mobility comments: HOB elevated, no physical assist, increased time to perform and reliant on bed rail    Transfers Overall transfer level: Needs assistance Equipment used: None Transfers: Sit to/from Stand Sit to Stand: Min guard         General transfer comment: from EOB, toilet heights did use wall rail by toilet but no AD needed; staff assist with lines/IV pole  Ambulation/Gait Ambulation/Gait assistance: Min guard Gait Distance (Feet): 125 Feet (+67ft to toilet) Assistive device: None Gait Pattern/deviations: Step-through  pattern;Decreased stride length (manual splinting near wound vac/incisional site) Gait velocity: decreased Gait velocity interpretation: <1.8 ft/sec, indicate of risk for recurrent falls General Gait Details: at times reaching for furniture in narrow spaces but defers AD for hallway ambulation   Stairs             Wheelchair Mobility    Modified Rankin (Stroke Patients Only)       Balance Overall balance assessment: Mild deficits observed, not formally tested Sitting-balance support: Feet supported;No upper extremity supported Sitting balance-Leahy Scale: Good     Standing balance support: Bilateral upper extremity supported;No upper extremity supported;Single extremity supported Standing balance-Leahy Scale: Fair Standing balance comment: no LOB but at times using wall rail such as for peri-care and forearm support at sink                            Cognition Arousal/Alertness: Awake/alert Behavior During Therapy: WFL for tasks assessed/performed Overall Cognitive Status: Within Functional Limits for tasks assessed                                        Exercises      General Comments General comments (skin integrity, edema, etc.): HR 103 bpm with exertion, SpO2 97% on RA      Pertinent Vitals/Pain Pain Assessment: Faces Faces Pain Scale: Hurts even more Pain Location: incisional/where wound vac applied Pain Descriptors / Indicators: Grimacing;Guarding;Discomfort Pain Intervention(s): Monitored during session;Repositioned;Premedicated before session;Other (comment) (pt defers more pain meds, encouraged ice if needed but  she defers this also)    Home Living                      Prior Function            PT Goals (current goals can now be found in the care plan section) Acute Rehab PT Goals Patient Stated Goal: to go home PT Goal Formulation: With patient Time For Goal Achievement: 04/05/21 Potential to Achieve Goals:  Good Progress towards PT goals: Progressing toward goals    Frequency    Min 3X/week      PT Plan Current plan remains appropriate    Co-evaluation              AM-PAC PT "6 Clicks" Mobility   Outcome Measure  Help needed turning from your back to your side while in a flat bed without using bedrails?: None Help needed moving from lying on your back to sitting on the side of a flat bed without using bedrails?: None Help needed moving to and from a bed to a chair (including a wheelchair)?: A Little Help needed standing up from a chair using your arms (e.g., wheelchair or bedside chair)?: A Little Help needed to walk in hospital room?: A Little Help needed climbing 3-5 steps with a railing? : A Little 6 Click Score: 20    End of Session Equipment Utilized During Treatment: Gait belt Activity Tolerance: Patient tolerated treatment well;Patient limited by pain Patient left: in bed;with call bell/phone within reach Nurse Communication: Mobility status;Other (comment) (pt requesting no pain meds may consider ice pack PRN) PT Visit Diagnosis: Unsteadiness on feet (R26.81);Muscle weakness (generalized) (M62.81);Difficulty in walking, not elsewhere classified (R26.2);Pain Pain - part of body:  (abdomen)     Time: 9892-1194 PT Time Calculation (min) (ACUTE ONLY): 17 min  Charges:  $Gait Training: 8-22 mins                     Cohen Doleman P., PTA Acute Rehabilitation Services Pager: [phone] Office: [place] 03/26/2021, 1:39 PM

## 2021-03-26 NOTE — Progress Notes (Signed)
WOC to start wound vac/negative pressure therapy. Patient premedicated with 1mg  IV dilaudid. Wound vac applied around 0930. Patient now complaining of pain during urination, UA and urine culture order acknowledged, waiting for patient to void. Now NPO/sips with meds, patient aware. NT also notified of change in orders.

## 2021-03-26 NOTE — TOC Progression Note (Signed)
Transition of Care [facility]) - Progression Note    Patient Details  Name: Regina Roberts MRN: [id number] Date of Birth: May 12, 1951  Transition of Care [facility]) CM/SW Contact  Marykatherine Sherwood, Edson Snowball, RN Phone Number: 03/26/2021, 11:18 AM  Clinical Narrative:     [facility] prescription signed by PA and faxed to [name] with [employer]. From progression patient will be here through the weekend.   Cory with [name] updated.   Spoke to patient at bedside. Explained [employer] will get authorization from her insurance, [employer] will deliver home VAC to her hospital room prior to discharge and discuss coverage/ payment.   She will have a HHRN to change dressing Monday, Wednesday, Friday .   Expected Discharge Plan: [place]    Expected Discharge Plan and Services Expected Discharge Plan: [place]   Discharge Planning Services: CM Consult Post Acute Care Choice: Home Health,Durable Medical Equipment Living arrangements for the past 2 months: Single Family Home                 DME Arranged: 3-N-1,Tub bench,Walker rolling DME Agency: [employer] Date DME Agency Contacted: 03/24/21 Time DME Agency Contacted: [id number] Representative spoke with at DME Agency: Sheila [place]: RN,PT [place] Agency: [place] Date [place]: 03/24/21 Time [place]: 1344 Representative spoke with at [place]: [place] ([place]) Interventions    Readmission Risk Interventions No flowsheet data found.

## 2021-03-26 NOTE — Progress Notes (Signed)
PHARMACY - TOTAL PARENTERAL NUTRITION CONSULT NOTE  Indication: Prolonged ileus  Patient Measurements: Height: 4' 11.5" (151.1 cm) Weight: 84.8 kg (186 lb 15.2 oz) IBW/kg (Calculated) : 44.35 TPN AdjBW (KG): 54.5 Body mass index is 37.13 kg/m.  Assessment:  22 YOF presented on 03/12/21 with abdominal pain, found to have perforated sigmoid diverticulitis and treated with conservative measures.  Patient tolerated FLD on 5/25 and then advanced to a soft diet on 5/26.  She soon worsened and repeat CT showed progression with 2 new paracolonic air-fluid collections.  Underwent ex-lap with LoA, colectomy, colostomy on 5/29.  Pharmacy consulted to manage TPN for prolonged ileus.  Glucose / Insulin: no hx DM - CBGs < 180. SSI d/c'd 6/2. Electrolytes: all WNL 6/2 Renal: SCr < 1, BUN WNL  Hepatic: LFTs / tbili / TG WNL.  Prealbumin down to < 5 likely d/t stress, albumin 2.1 Intake / Output; MIVF: UOP 1912ml =1 ml/kg/hr, net +13.1L.  po intake 330ml charted, Ensure Surgery x 3 on 6/2. NS at kvo. LBM noted 5/20, +flatus  GI Meds: po PPI  GI Imaging: 5/21 CT: scattered colonic diverticulosis complicated by a perf acute sigmoid diverticulitis 5/24: stable sigmoid diverticulitis  5/27: pneumoperitoneum consistent with perf diverticulitis; interval development of 2 adjacent paradiverticular abscesses  GI Surgeries / Procedures:  5/28: ex-lap with LoA, sigmoid colectomy, mobilization of splenic flexure, creation of end colostomy, ileocecectomy with ileocolonic anastomosis  Central access: PICC placed 03/21/21 TPN start date: 03/21/21  Nutritional Goals (RD rec on 5/30): 1770-1980 kCal, 90-100g protein, >/= 1.8L fluid per day  Current Nutrition:  TPN FLD Resource Breeze TID - 1 charted given yesterday  Plan:  D/c Boost per RD note. Not being given anyway. Change Ensure Surgery to Ensure Enlive TID per RD note (each provides 350 kcal and 20 grams of protein)  Continue TPN at goal rate 80  ml/hr to provide 96g AA, 259g CHO and 56g ILE for a total of 1821 kCal, meeting 100% of patient's needs - If she consistently receives her Ensure (providing 1050 kcal and 60g protein daily (as charted on 6/2), cut back on TPN due to overfeeding.  Electrolytes in TPN: increase Na to 31mEq/L, K 54mEq/L, Ca 19mEq/L, Mg 37mEq/L, Phos 82mmol/L, change Cl:Ac to 2:1  Add standard MVI and trace elements to TPN D/C SSI/CBG checks Standard TPN labs on Mon/Thurs and prn F/u PO intake/diet advancement to wean TPN Possible need for CT scan if no progress next 24-48 hours with con't distention and tenderness.   Regina Roberts, PharmD, BCPS Clinical Staff Pharmacist [email or web address] 03/26/2021, 7:46 AM

## 2021-03-26 NOTE — Plan of Care (Signed)
  Problem: Education: Goal: Knowledge of General Education information will improve Description: Including pain rating scale, medication(s)/side effects and non-pharmacologic comfort measures Outcome: Progressing   Problem: Activity: Goal: Risk for activity intolerance will decrease Outcome: Progressing   Problem: Nutrition: Goal: Adequate nutrition will be maintained Outcome: Progressing   

## 2021-03-26 NOTE — Progress Notes (Signed)
Progress Note  6 Days Post-Op  Subjective: Patient with increased pain overnight. Pain is worst in RLQ and with movement. Reports pain with urination similar to what she had pre-op. She denies nausea or vomiting. She feels hot this AM and sweaty.   Objective: Vital signs in last 24 hours: Temp:  [99.1 F (37.3 C)-99.5 F (37.5 C)] 99.5 F (37.5 C) (06/03 0342) Pulse Rate:  [88-98] 98 (06/03 0342) Resp:  [16] 16 (06/03 0342) BP: (147-151)/(79) 147/79 (06/03 0342) SpO2:  [95 %-96 %] 95 % (06/03 0342) Last BM Date: 03/12/21  Intake/Output from previous day: 06/02 0701 - 06/03 0700 In: 443 [P.O.:320; I.V.:123] Out: 1950 [Urine:1950] Intake/Output this shift: No intake/output data recorded.  PE: General: pleasant, WD,overweightfemale who is laying in bed and appears uncomfortable and diaphoretic  Heart: regular, rate, and rhythm.  Lungs: CTAB, no wheezes, rhonchi, or rales noted. Respiratory effort nonlabored Abd: soft,increased ttp along R abdomen,mildly distended,BS hypoactive,midline wound appears clean, stoma viable withsmall amount stool in bag   Lab Results:  Recent Labs    03/25/21 0336 03/26/21 0354  WBC 18.3* 21.4*  HGB 8.3* 8.8*  HCT 26.0* 27.2*  PLT 532* 584*   BMET Recent Labs    03/24/21 0237 03/25/21 0336  NA 137 136  K 4.1 4.3  CL 101 103  CO2 29 28  GLUCOSE 131* 138*  BUN 10 14  CREATININE 0.67 0.69  CALCIUM 8.6* 8.5*   PT/INR No results for input(s): LABPROT, INR in the last 72 hours. CMP     Component Value Date/Time   NA 136 03/25/2021 0336   K 4.3 03/25/2021 0336   CL 103 03/25/2021 0336   CO2 28 03/25/2021 0336   GLUCOSE 138 (H) 03/25/2021 0336   BUN 14 03/25/2021 0336   CREATININE 0.69 03/25/2021 0336   CALCIUM 8.5 (L) 03/25/2021 0336   PROT 5.6 (L) 03/25/2021 0336   ALBUMIN 2.1 (L) 03/25/2021 0336   AST 14 (L) 03/25/2021 0336   ALT 24 03/25/2021 0336   ALKPHOS 54 03/25/2021 0336   BILITOT 0.3 03/25/2021 0336    GFRNONAA >60 03/25/2021 0336   Lipase     Component Value Date/Time   LIPASE 21 03/12/2021 1959       Studies/Results: No results found.  Anti-infectives: Anti-infectives (From admission, onward)   Start     Dose/Rate Route Frequency Ordered Stop   03/20/21 0715  cefoTEtan (CEFOTAN) 2 g in sodium chloride 0.9 % 100 mL IVPB        2 g 200 mL/hr over 30 Minutes Intravenous On call to O.R. 03/20/21 3704 03/20/21 0951   03/19/21 1400  ertapenem (INVANZ) 1,000 mg in sodium chloride 0.9 % 100 mL IVPB  Status:  Discontinued        1 g 200 mL/hr over 30 Minutes Intravenous Every 24 hours 03/19/21 1224 03/24/21 0751   03/18/21 1200  amoxicillin-clavulanate (AUGMENTIN) 875-125 MG per tablet 1 tablet  Status:  Discontinued        1 tablet Oral Every 12 hours 03/18/21 0759 03/19/21 1224   03/13/21 1400  piperacillin-tazobactam (ZOSYN) IVPB 3.375 g  Status:  Discontinued        3.375 g 12.5 mL/hr over 240 Minutes Intravenous Every 8 hours 03/13/21 0757 03/18/21 0759   03/13/21 0615  piperacillin-tazobactam (ZOSYN) IVPB 3.375 g        3.375 g 12.5 mL/hr over 240 Minutes Intravenous  Once 03/13/21 0604 03/13/21 1247  Assessment/Plan Perforatedsigmoiddiverticulitis POD6s/p exp lap, ileocecectomy, repair SB serosal tears, LOA, colectomy/end colostomy, 5/28 Dr Kae Heller -tolerating FLD and having some bowel function but worsening pain this AM - NPO for scan  -WOC following for stoma, VAC placed today  - pulm toilet, IS, encourage mobilization  -WBC up this AM to 21 from 18 yesterday, pt diaphoretic this AM - check UA and CT A/P with PO and IV contrast  - continue TPNtoday  FEN:NPO, TPN, IVF KVO, SSI VTE: lovenox ID: Zosyn 5/21>5/26; PO Augmentin 5/26>5/27, Ertapenem 5/27>5/31  HTN HLD GERD LUE edema -resolved, [place] 5/24 negative for SVT or DVT SevereProtein calorie malnutrition - prealbumin 7.8(5/28) and <5 (5/30)  LOS: 13 days    [name], [facility] Surgery 03/26/2021, 10:28 AM Please see Amion for pager number during day hours 7:00am-4:30pm

## 2021-03-26 NOTE — Consult Note (Signed)
WOC Nurse Consult Note: Patient receiving care in [name]. Reason for Consult: Placement of NPWT to abdominal wound Wound type: surgical Pressure Injury POA: Yes/No/NA Measurement: 18.2 cm x 5.8 cm x 4.8 cm Wound bed: pink; one blue suture exposed Drainage (amount, consistency, odor) to be determined Periwound: intact Dressing procedure/placement/frequency: 2 pieces of black foam placed into wound bed, drape applied, seal obtained.  VAC dressing in room for Monday. Val Riles, RN, MSN, CWOCN, CNS-BC, pager [phone]

## 2021-03-27 LAB — BASIC METABOLIC PANEL
Anion gap: 8 (ref 5–15)
BUN: 15 mg/dL (ref 8–23)
CO2: 26 mmol/L (ref 22–32)
Calcium: 9 mg/dL (ref 8.9–10.3)
Chloride: 100 mmol/L (ref 98–111)
Creatinine, Ser: 0.74 mg/dL (ref 0.44–1.00)
GFR, Estimated: >60 mL/min (ref >60)
Glucose, Bld: 130 mg/dL — ABNORMAL HIGH (ref 70–99)
Potassium: 4.2 mmol/L (ref 3.5–5.1)
Sodium: 134 mmol/L — ABNORMAL LOW (ref 135–145)

## 2021-03-27 LAB — CBC
HCT: 27.3 % — ABNORMAL LOW (ref 36.0–46.0)
Hemoglobin: 8.8 g/dL — ABNORMAL LOW (ref 12.0–15.0)
MCH: 27.9 pg (ref 26.0–34.0)
MCHC: 32.2 g/dL (ref 30.0–36.0)
MCV: 86.7 fL (ref 80.0–100.0)
Platelets: 619 10*3/uL — ABNORMAL HIGH (ref 150–400)
RBC: 3.15 MIL/uL — ABNORMAL LOW (ref 3.87–5.11)
RDW: 15.9 % — ABNORMAL HIGH (ref 11.5–15.5)
WBC: 20.9 10*3/uL — ABNORMAL HIGH (ref 4.0–10.5)
nRBC: 0.2 % (ref 0.0–0.2)

## 2021-03-27 LAB — URINE CULTURE

## 2021-03-27 LAB — PHOSPHORUS: Phosphorus: 3.8 mg/dL (ref 2.5–4.6)

## 2021-03-27 LAB — MAGNESIUM: Magnesium: 2 mg/dL (ref 1.7–2.4)

## 2021-03-27 MED ORDER — STERILE WATER FOR INJECTION IV SOLN
INTRAVENOUS | Status: DC
Start: 2021-03-27 — End: 2021-03-27
  Filled 2021-03-27: qty 960

## 2021-03-27 MED ORDER — STERILE WATER FOR INJECTION IV SOLN
INTRAVENOUS | Status: AC
Start: 2021-03-27 — End: 2021-03-28
  Filled 2021-03-27: qty 480

## 2021-03-27 MED ORDER — POTASSIUM CHLORIDE CRYS ER 20 MEQ PO TBCR
40.0000 meq | EXTENDED_RELEASE_TABLET | Freq: Once | ORAL | Status: AC
  Administered 2021-03-27: 40 meq via ORAL
  Filled 2021-03-27: qty 2

## 2021-03-27 NOTE — Progress Notes (Addendum)
PHARMACY - TOTAL PARENTERAL NUTRITION CONSULT NOTE  Indication: Prolonged ileus  Patient Measurements: Height: 4' 11.5" (151.1 cm) Weight: 84.8 kg (186 lb 15.2 oz) IBW/kg (Calculated) : 44.35 TPN AdjBW (KG): 54.5 Body mass index is 37.13 kg/m.  Assessment:  63 YOF presented on 03/12/21 with abdominal pain, found to have perforated sigmoid diverticulitis and treated with conservative measures.  Patient tolerated FLD on 5/25 and then advanced to a soft diet on 5/26.  She soon worsened and repeat CT showed progression with 2 new paracolonic air-fluid collections.  Underwent ex-lap with LoA, colectomy, colostomy on 5/29.  Pharmacy consulted to manage TPN for prolonged ileus.  Glucose / Insulin: no hx DM - CBGs < 180. SSI/CBG checks d/c'd 6/2. Electrolytes: Na low at 134, CoCa high normal at 10.5, others WNL Renal: SCr < 1, BUN WNL  Hepatic: LFTs / tbili / TG WNL.  Prealbumin down to < 5 likely d/t stress, albumin 2.1 Intake / Output; MIVF: UOP 0.6 ml/kg/hr, net +12L. LBM noted 5/20, +flatus GI Imaging: 5/21 CT: scattered colonic diverticulosis complicated by a perf acute sigmoid diverticulitis 5/24: stable sigmoid diverticulitis  5/27: pneumoperitoneum consistent with perf diverticulitis; interval development of 2 adjacent paradiverticular abscesses 6/3 CT: likely post-op fluid in pelvic and mesentery, no evidence of SBO GI Surgeries / Procedures:  5/28: ex-lap with LoA, sigmoid colectomy, mobilization of splenic flexure, creation of end colostomy, ileocecectomy with ileocolonic anastomosis  Central access: PICC placed 03/21/21 TPN start date: 03/21/21  Nutritional Goals (RD rec on 5/30): 1770-1980 kCal, 90-100g protein, >/= 1.8L fluid per day  Current Nutrition:  TPN Downgraded to CLD Ensure Enlive TID (350 kCal, 20g AA each) = 1 charted given yesterday  Plan:  Continue TPN at goal rate 80 ml/hr to provide 96g AA, 259g CHO and 56g ILE for a total of 1821 kCal, meeting 100% of  patient's needs Electrolytes in TPN: increase Na to 125mEq/L, K 30mEq/L, decrease Ca to 3.51mE/L, Mg 73mEq/L, Phos 49mmol/L, Cl:Ac 2:1  Add standard MVI and trace elements to TPN Standard TPN labs on Mon/Thurs.  F/U PO intake/diet advancement as possible.  Aizley Stenseth D. Mina Marble, PharmD, BCPS, [place] 03/27/2021, 8:35 AM  ===========================  Addendum: - Advance to FLD and half TPN per Surgery to stimulate appetite - TPN at 40 ml/hr - Electrolytes in TPN: max out all except Ca to account for reduced TPN rate - Na 189mEq/L, K 50mEq/L, Ca 63mEq/L, Mg 51mEq/L, Phos 55mmol/L, Cl:Ac 2:1  - KCL 48mEq PO to account for less K in TPN - TPN labs on Mon  Merwin Breden D. Mina Marble, PharmD, BCPS, [place] 03/27/2021, 9:22 AM

## 2021-03-27 NOTE — Progress Notes (Signed)
Progress Note  7 Days Post-Op  Subjective: Patient reports she is feeling a little better overall this AM. She is tolerating some FLD but not taking in much. Ostomy working.   Objective: Vital signs in last 24 hours: Temp:  [98 F (36.7 C)-98.7 F (37.1 C)] 98 F (36.7 C) (06/04 0500) Pulse Rate:  [84-90] 84 (06/04 0500) Resp:  [15-16] 16 (06/04 0500) BP: (120-132)/(63-74) 120/72 (06/04 0500) SpO2:  [95 %-97 %] 97 % (06/04 0500) Last BM Date: 03/12/21  Intake/Output from previous day: 06/03 0701 - 06/04 0700 In: 240 [P.O.:240] Out: 1200 [Urine:1200] Intake/Output this shift: No intake/output data recorded.  PE: General: pleasant, WD,overweightfemale who is laying in bed and appears uncomfortable and diaphoretic  Heart: regular, rate, and rhythm.  Lungs: CTAB, no wheezes, rhonchi, or rales noted. Respiratory effort nonlabored Abd: soft,appropriately ttp,mildly distended,BS hypoactive,midline wound with VAC present, stoma viable withsmall amount stool in bag   Lab Results:  Recent Labs    03/26/21 0354 03/27/21 0243  WBC 21.4* 20.9*  HGB 8.8* 8.8*  HCT 27.2* 27.3*  PLT 584* 619*   BMET Recent Labs    03/25/21 0336 03/27/21 0243  NA 136 134*  K 4.3 4.2  CL 103 100  CO2 28 26  GLUCOSE 138* 130*  BUN 14 15  CREATININE 0.69 0.74  CALCIUM 8.5* 9.0   PT/INR No results for input(s): LABPROT, INR in the last 72 hours. CMP     Component Value Date/Time   NA 134 (L) 03/27/2021 0243   K 4.2 03/27/2021 0243   CL 100 03/27/2021 0243   CO2 26 03/27/2021 0243   GLUCOSE 130 (H) 03/27/2021 0243   BUN 15 03/27/2021 0243   CREATININE 0.74 03/27/2021 0243   CALCIUM 9.0 03/27/2021 0243   PROT 5.6 (L) 03/25/2021 0336   ALBUMIN 2.1 (L) 03/25/2021 0336   AST 14 (L) 03/25/2021 0336   ALT 24 03/25/2021 0336   ALKPHOS 54 03/25/2021 0336   BILITOT 0.3 03/25/2021 0336   GFRNONAA >60 03/27/2021 0243   Lipase     Component Value Date/Time   LIPASE 21  03/12/2021 1959       Studies/Results: CT ABDOMEN PELVIS W CONTRAST  Result Date: 03/26/2021 CLINICAL DATA:  Abdominal abscess, infection suspected EXAM: CT ABDOMEN AND PELVIS WITH CONTRAST TECHNIQUE: Multidetector CT imaging of the abdomen and pelvis was performed using the standard protocol following bolus administration of intravenous contrast. CONTRAST:  68mL OMNIPAQUE IOHEXOL 300 MG/ML  SOLN COMPARISON:  03/19/2021 FINDINGS: Lower chest: No acute abnormality.  Left base atelectasis. Hepatobiliary: No focal hepatic abnormality. Gallbladder unremarkable. Pancreas: No focal abnormality or ductal dilatation. Spleen: No focal abnormality.  Normal size. Adrenals/Urinary Tract: Bilateral renal cysts are stable. No hydronephrosis. No adrenal mass. Urinary bladder unremarkable. Stomach/Bowel: Left lower quadrant ostomy noted with partial colectomy and Hartmann's pouch. No evidence of bowel obstruction. Stomach and small bowel decompressed, unremarkable. Vascular/Lymphatic: Aortic atherosclerosis. No evidence of aneurysm or adenopathy. Reproductive: Uterus and adnexa unremarkable.  No mass. Other: There is stranding and a small amount of fluid seen within the pelvis and lower mesentery, likely postoperative. No focal fluid collection to suggest abscess. Small amount of free fluid also seen in the left paracolic gutter. Musculoskeletal: No acute bony abnormality. IMPRESSION: Postoperative changes from partial colectomy and creation of left lower quadrant ostomy. There is some fluid in the pelvis and mesentery, likely postoperative. No focal well-circumscribed fluid collection to suggest abscess at this time. No evidence of bowel obstruction. Stable  bilateral renal cysts. Aortic atherosclerosis. Electronically Signed   By: Rolm Baptise M.D.   On: 03/26/2021 17:23    Anti-infectives: Anti-infectives (From admission, onward)   Start     Dose/Rate Route Frequency Ordered Stop   03/20/21 0715  cefoTEtan (CEFOTAN)  2 g in sodium chloride 0.9 % 100 mL IVPB        2 g 200 mL/hr over 30 Minutes Intravenous On call to O.R. 03/20/21 [id number] 03/20/21 0951   03/19/21 1400  ertapenem (INVANZ) 1,000 mg in sodium chloride 0.9 % 100 mL IVPB  Status:  Discontinued        1 g 200 mL/hr over 30 Minutes Intravenous Every 24 hours 03/19/21 1224 03/24/21 0751   03/18/21 1200  amoxicillin-clavulanate (AUGMENTIN) 875-125 MG per tablet 1 tablet  Status:  Discontinued        1 tablet Oral Every 12 hours 03/18/21 0759 03/19/21 1224   03/13/21 1400  piperacillin-tazobactam (ZOSYN) IVPB 3.375 g  Status:  Discontinued        3.375 g 12.5 mL/hr over 240 Minutes Intravenous Every 8 hours 03/13/21 0757 03/18/21 0759   03/13/21 0615  piperacillin-tazobactam (ZOSYN) IVPB 3.375 g        3.375 g 12.5 mL/hr over 240 Minutes Intravenous  Once 03/13/21 0604 03/13/21 1247       Assessment/Plan Perforatedsigmoiddiverticulitis POD7s/p exp lap, ileocecectomy, repair SB serosal tears, LOA, colectomy/end colostomy, 5/28 Dr Kae Heller -CT yesterday with likely post-op pelvic fluid but no abscess, no ileus or SBO -WOC following for stoma, VAC placed yesterday - change Mon  - pulm toilet, IS, encourage mobilization  -[id number] - 1/2 TPNtoday and see if that stimulates appetite further   FEN:FLD, 1/2 TPN, IVF KVO, SSI VTE: lovenox ID: Zosyn 5/21>5/26; PO Augmentin 5/26>5/27, Ertapenem 5/27>5/31  HTN HLD GERD LUE edema -resolved,US 5/24 negative for SVT or DVT SevereProtein calorie malnutrition - prealbumin 7.8(5/28) and <5(5/30)  LOS: 14 days    [name], [facility] 03/27/2021, 8:49 AM Please see Amion for pager number during day hours 7:00am-4:30pm

## 2021-03-28 LAB — CBC
HCT: 27.5 % — ABNORMAL LOW (ref 36.0–46.0)
Hemoglobin: 8.9 g/dL — ABNORMAL LOW (ref 12.0–15.0)
MCH: 28.2 pg (ref 26.0–34.0)
MCHC: 32.4 g/dL (ref 30.0–36.0)
MCV: 87 fL (ref 80.0–100.0)
Platelets: 623 10*3/uL — ABNORMAL HIGH (ref 150–400)
RBC: 3.16 MIL/uL — ABNORMAL LOW (ref 3.87–5.11)
RDW: 15.9 % — ABNORMAL HIGH (ref 11.5–15.5)
WBC: 16.5 10*3/uL — ABNORMAL HIGH (ref 4.0–10.5)
nRBC: 0.1 % (ref 0.0–0.2)

## 2021-03-28 LAB — BASIC METABOLIC PANEL
Anion gap: 8 (ref 5–15)
BUN: 16 mg/dL (ref 8–23)
CO2: 25 mmol/L (ref 22–32)
Calcium: 8.9 mg/dL (ref 8.9–10.3)
Chloride: 102 mmol/L (ref 98–111)
Creatinine, Ser: 0.81 mg/dL (ref 0.44–1.00)
GFR, Estimated: >60 mL/min (ref >60)
Glucose, Bld: 129 mg/dL — ABNORMAL HIGH (ref 70–99)
Potassium: 4.4 mmol/L (ref 3.5–5.1)
Sodium: 135 mmol/L (ref 135–145)

## 2021-03-28 MED ORDER — POLYETHYLENE GLYCOL 3350 17 G PO PACK
17.0000 g | PACK | Freq: Every day | ORAL | Status: DC
  Administered 2021-03-30: 17 g via ORAL
  Filled 2021-03-28 (×2): qty 1

## 2021-03-28 NOTE — Progress Notes (Signed)
PHARMACY - TOTAL PARENTERAL NUTRITION CONSULT NOTE  Indication: Prolonged ileus  Patient Measurements: Height: 4' 11.5" (151.1 cm) Weight: 84.8 kg (186 lb 15.2 oz) IBW/kg (Calculated) : 44.35 TPN AdjBW (KG): 54.5 Body mass index is 37.13 kg/m.  Assessment:  4 YOF presented on 03/12/21 with abdominal pain, found to have perforated sigmoid diverticulitis and treated with conservative measures.  Patient tolerated FLD on 5/25 and then advanced to a soft diet on 5/26.  She soon worsened and repeat CT showed progression with 2 new paracolonic air-fluid collections.  Underwent ex-lap with LoA, colectomy, colostomy on 5/29.  Pharmacy consulted to manage TPN for prolonged ileus.  Glucose / Insulin: no hx DM - CBGs < 180. SSI/CBG checks d/c'd 6/2. Electrolytes: all WNL (Na improved to 135, CoCa high normal at 10.4) Renal: SCr < 1, BUN WNL  Hepatic: LFTs / tbili / TG WNL.  Prealbumin down to < 5 likely d/t stress, albumin 2.1 Intake / Output; MIVF: UOP 0.4 ml/kg/hr, net +12.5L. LBM noted 5/20, +flatus GI Imaging: 5/21 CT: scattered colonic diverticulosis complicated by a perf acute sigmoid diverticulitis 5/24: stable sigmoid diverticulitis  5/27: pneumoperitoneum consistent with perf diverticulitis; interval development of 2 adjacent paradiverticular abscesses 6/3 CT: likely post-op fluid in pelvic and mesentery, no evidence of SBO GI Surgeries / Procedures:  5/28: ex-lap with LoA, sigmoid colectomy, mobilization of splenic flexure, creation of end colostomy, ileocecectomy with ileocolonic anastomosis  Central access: PICC placed 03/21/21 TPN start date: 03/21/21  Nutritional Goals (RD rec on 5/30): 1770-1980 kCal, 90-100g protein, >/= 1.8L fluid per day Goal TPN rate 34ml/hr (Na 145mEq/L, K 71mEq/L, Ca 3.51mEq/L, Mag 68mEq/L, Phos 45mmol/L, Cl:Ac 2:1)  Current Nutrition:  TPN Full liquid diet Ensure Enlive TID (350 kCal, 20g AA each) = 3 charted given yesterday  Plan:  Advance diet to soft  and D/C TPN per Surgery Reduce TPN to 20 ml/hr, then stop at 1800 D/C TPN labs and nursing care orders  Sammuel Blick D. Mina Marble, PharmD, BCPS, [place] 03/28/2021, 8:43 AM

## 2021-03-28 NOTE — Progress Notes (Signed)
Progress Note  8 Days Post-Op  Subjective: Patient tolerating FLD and reports some increase in appetite overnight. Denies nausea or vomiting. Pain well controlled this AM.   Objective: Vital signs in last 24 hours: Temp:  [98.2 F (36.8 C)-99 F (37.2 C)] 98.5 F (36.9 C) (06/05 0433) Pulse Rate:  [86-95] 86 (06/05 0433) Resp:  [15-17] 16 (06/05 0433) BP: (126-132)/(68-76) 132/76 (06/05 0433) SpO2:  [95 %-97 %] 96 % (06/05 0433) Last BM Date: 03/27/21 (colostomy)  Intake/Output from previous day: 06/04 0701 - 06/05 0700 In: 2717.2 [P.O.:420; I.V.:2297.2] Out: 750 [Urine:750] Intake/Output this shift: Total I/O In: -  Out: 300 [Urine:300]  PE: General: pleasant, WD,overweightfemale who is laying in bedin NAD Heart: regular, rate, and rhythm.  Lungs: CTAB, no wheezes, rhonchi, or rales noted. Respiratory effort nonlabored Abd: soft,appropriately ttp,mildly distended,BS hypoactive,midline wound with VAC present, stoma viable withsmall amount stool in bag   Lab Results:  Recent Labs    03/27/21 0243 03/28/21 0326  WBC 20.9* 16.5*  HGB 8.8* 8.9*  HCT 27.3* 27.5*  PLT 619* 623*   BMET Recent Labs    03/27/21 0243 03/28/21 0326  NA 134* 135  K 4.2 4.4  CL 100 102  CO2 26 25  GLUCOSE 130* 129*  BUN 15 16  CREATININE 0.74 0.81  CALCIUM 9.0 8.9   PT/INR No results for input(s): LABPROT, INR in the last 72 hours. CMP     Component Value Date/Time   NA 135 03/28/2021 0326   K 4.4 03/28/2021 0326   CL 102 03/28/2021 0326   CO2 25 03/28/2021 0326   GLUCOSE 129 (H) 03/28/2021 0326   BUN 16 03/28/2021 0326   CREATININE 0.81 03/28/2021 0326   CALCIUM 8.9 03/28/2021 0326   PROT 5.6 (L) 03/25/2021 0336   ALBUMIN 2.1 (L) 03/25/2021 0336   AST 14 (L) 03/25/2021 0336   ALT 24 03/25/2021 0336   ALKPHOS 54 03/25/2021 0336   BILITOT 0.3 03/25/2021 0336   GFRNONAA >60 03/28/2021 0326   Lipase     Component Value Date/Time   LIPASE 21 03/12/2021  1959       Studies/Results: CT ABDOMEN PELVIS W CONTRAST  Result Date: 03/26/2021 CLINICAL DATA:  Abdominal abscess, infection suspected EXAM: CT ABDOMEN AND PELVIS WITH CONTRAST TECHNIQUE: Multidetector CT imaging of the abdomen and pelvis was performed using the standard protocol following bolus administration of intravenous contrast. CONTRAST:  30mL OMNIPAQUE IOHEXOL 300 MG/ML  SOLN COMPARISON:  03/19/2021 FINDINGS: Lower chest: No acute abnormality.  Left base atelectasis. Hepatobiliary: No focal hepatic abnormality. Gallbladder unremarkable. Pancreas: No focal abnormality or ductal dilatation. Spleen: No focal abnormality.  Normal size. Adrenals/Urinary Tract: Bilateral renal cysts are stable. No hydronephrosis. No adrenal mass. Urinary bladder unremarkable. Stomach/Bowel: Left lower quadrant ostomy noted with partial colectomy and Hartmann's pouch. No evidence of bowel obstruction. Stomach and small bowel decompressed, unremarkable. Vascular/Lymphatic: Aortic atherosclerosis. No evidence of aneurysm or adenopathy. Reproductive: Uterus and adnexa unremarkable.  No mass. Other: There is stranding and a small amount of fluid seen within the pelvis and lower mesentery, likely postoperative. No focal fluid collection to suggest abscess. Small amount of free fluid also seen in the left paracolic gutter. Musculoskeletal: No acute bony abnormality. IMPRESSION: Postoperative changes from partial colectomy and creation of left lower quadrant ostomy. There is some fluid in the pelvis and mesentery, likely postoperative. No focal well-circumscribed fluid collection to suggest abscess at this time. No evidence of bowel obstruction. Stable bilateral renal cysts. Aortic  atherosclerosis. Electronically Signed   By: Rolm Baptise M.D.   On: 03/26/2021 17:23    Anti-infectives: Anti-infectives (From admission, onward)   Start     Dose/Rate Route Frequency Ordered Stop   03/20/21 0715  cefoTEtan (CEFOTAN) 2 g in  sodium chloride 0.9 % 100 mL IVPB        2 g 200 mL/hr over 30 Minutes Intravenous On call to O.R. 03/20/21 [id number] 03/20/21 0951   03/19/21 1400  ertapenem (INVANZ) 1,000 mg in sodium chloride 0.9 % 100 mL IVPB  Status:  Discontinued        1 g 200 mL/hr over 30 Minutes Intravenous Every 24 hours 03/19/21 1224 03/24/21 0751   03/18/21 1200  amoxicillin-clavulanate (AUGMENTIN) 875-125 MG per tablet 1 tablet  Status:  Discontinued        1 tablet Oral Every 12 hours 03/18/21 0759 03/19/21 1224   03/13/21 1400  piperacillin-tazobactam (ZOSYN) IVPB 3.375 g  Status:  Discontinued        3.375 g 12.5 mL/hr over 240 Minutes Intravenous Every 8 hours 03/13/21 0757 03/18/21 0759   03/13/21 0615  piperacillin-tazobactam (ZOSYN) IVPB 3.375 g        3.375 g 12.5 mL/hr over 240 Minutes Intravenous  Once 03/13/21 0604 03/13/21 1247       Assessment/Plan Perforatedsigmoiddiverticulitis POD8s/p exp lap, ileocecectomy, repair SB serosal tears, LOA, colectomy/end colostomy, 5/28 Dr Kae Heller -CT 6/3 with likely post-op pelvic fluid but no abscess, no ileus or SBO -WOC following for stoma,VAC placed 6/3 - change Mon - pulm toilet, IS, encourage mobilization  -[id number] - recheck tomorrow - DC TPN and advance to soft diet, check prealbumin in AM - added miralax  TJQ:ZESP diet, ensure  VTE: lovenox ID: Zosyn 5/21>5/26; PO Augmentin 5/26>5/27, Ertapenem 5/27>5/31  HTN HLD GERD LUE edema -resolved,US 5/24 negative for SVT or DVT SevereProtein calorie malnutrition - prealbumin 7.8(5/28) and <5(5/30)  LOS: 15 days    [name], [facility] 03/28/2021, 11:19 AM Please see Amion for pager number during day hours 7:00am-4:30pm

## 2021-03-29 LAB — COMPREHENSIVE METABOLIC PANEL
ALT: 33 U/L (ref 0–44)
AST: 17 U/L (ref 15–41)
Albumin: 2.6 g/dL — ABNORMAL LOW (ref 3.5–5.0)
Alkaline Phosphatase: 61 U/L (ref 38–126)
Anion gap: 7 (ref 5–15)
BUN: 20 mg/dL (ref 8–23)
CO2: 26 mmol/L (ref 22–32)
Calcium: 9.1 mg/dL (ref 8.9–10.3)
Chloride: 101 mmol/L (ref 98–111)
Creatinine, Ser: 0.87 mg/dL (ref 0.44–1.00)
GFR, Estimated: >60 mL/min (ref >60)
Glucose, Bld: 123 mg/dL — ABNORMAL HIGH (ref 70–99)
Potassium: 4.1 mmol/L (ref 3.5–5.1)
Sodium: 134 mmol/L — ABNORMAL LOW (ref 135–145)
Total Bilirubin: 0.3 mg/dL (ref 0.3–1.2)
Total Protein: 6.2 g/dL — ABNORMAL LOW (ref 6.5–8.1)

## 2021-03-29 LAB — PREALBUMIN: Prealbumin: 19.7 mg/dL (ref 18–38)

## 2021-03-29 LAB — CBC
HCT: 27.8 % — ABNORMAL LOW (ref 36.0–46.0)
Hemoglobin: 8.7 g/dL — ABNORMAL LOW (ref 12.0–15.0)
MCH: 27.4 pg (ref 26.0–34.0)
MCHC: 31.3 g/dL (ref 30.0–36.0)
MCV: 87.4 fL (ref 80.0–100.0)
Platelets: 629 10*3/uL — ABNORMAL HIGH (ref 150–400)
RBC: 3.18 MIL/uL — ABNORMAL LOW (ref 3.87–5.11)
RDW: 15.9 % — ABNORMAL HIGH (ref 11.5–15.5)
WBC: 14.7 10*3/uL — ABNORMAL HIGH (ref 4.0–10.5)
nRBC: 0 % (ref 0.0–0.2)

## 2021-03-29 MED ORDER — HYDROMORPHONE HCL 1 MG/ML IJ SOLN: 0.5000 mg | Freq: Four times a day (QID) | INTRAMUSCULAR | Status: DC | PRN

## 2021-03-29 MED ORDER — TRAMADOL HCL 50 MG PO TABS
50.0000 mg | ORAL_TABLET | Freq: Four times a day (QID) | ORAL | Status: DC
Start: 2021-03-29 — End: 2021-03-29
  Administered 2021-03-29: 50 mg via ORAL
  Filled 2021-03-29: qty 1

## 2021-03-29 MED ORDER — HYDROMORPHONE HCL 1 MG/ML IJ SOLN
0.5000 mg | INTRAMUSCULAR | Status: DC | PRN
  Administered 2021-03-29 (×2): 1 mg via INTRAVENOUS
  Filled 2021-03-29 (×2): qty 1

## 2021-03-29 NOTE — Progress Notes (Addendum)
Progress Note  9 Days Post-Op  Subjective: CC: Seen at time of VAC and ostomy change. She has significant pain with VAC change required IV pain medication this am. Other than with VAC changes her pain is well controlled. She is tolerating soft diet. Ambulating well. No respiratory complaints.  Objective: Vital signs in last 24 hours: Temp:  [98 F (36.7 C)-99.1 F (37.3 C)] 99.1 F (37.3 C) (06/06 0343) Pulse Rate:  [92-96] 96 (06/06 0343) Resp:  [17-20] 20 (06/06 0343) BP: (113-122)/(63-74) 122/72 (06/06 0343) SpO2:  [96 %-98 %] 97 % (06/06 0343) Last BM Date: 03/28/21  Intake/Output from previous day: 06/05 0701 - 06/06 0700 In: 320 [P.O.:320] Out: 700 [Urine:700] Intake/Output this shift: No intake/output data recorded.  PE: General: pleasant, WD, female who is laying in bed in NAD HEENT: head is normocephalic, atraumatic.  Sclera are noninjected. Mouth is pink and moist Heart: Palpable radial pulses. No cyanosis Lungs: Respiratory effort nonlabored. No audible wheezing Abd: soft, minimally distended, minimally tender around incision and tender with VAC change. Ostomy with brown liquid output MS: all 4 extremities are symmetrical with no cyanosis, clubbing, or edema. No calf TTP bilaterally Skin: warm and dry with no masses, lesions, or rashes Psych: A&Ox3 with an appropriate affect.       Lab Results:  Recent Labs    03/28/21 0326 03/29/21 0330  WBC 16.5* 14.7*  HGB 8.9* 8.7*  HCT 27.5* 27.8*  PLT 623* 629*   BMET Recent Labs    03/28/21 0326 03/29/21 0330  NA 135 134*  K 4.4 4.1  CL 102 101  CO2 25 26  GLUCOSE 129* 123*  BUN 16 20  CREATININE 0.81 0.87  CALCIUM 8.9 9.1   PT/INR No results for input(s): LABPROT, INR in the last 72 hours. CMP     Component Value Date/Time   NA 134 (L) 03/29/2021 0330   K 4.1 03/29/2021 0330   CL 101 03/29/2021 0330   CO2 26 03/29/2021 0330   GLUCOSE 123 (H) 03/29/2021 0330   BUN 20 03/29/2021 0330    CREATININE 0.87 03/29/2021 0330   CALCIUM 9.1 03/29/2021 0330   PROT 6.2 (L) 03/29/2021 0330   ALBUMIN 2.6 (L) 03/29/2021 0330   AST 17 03/29/2021 0330   ALT 33 03/29/2021 0330   ALKPHOS 61 03/29/2021 0330   BILITOT 0.3 03/29/2021 0330   GFRNONAA >60 03/29/2021 0330   Lipase     Component Value Date/Time   LIPASE 21 03/12/2021 1959       Studies/Results: No results found.  Anti-infectives: Anti-infectives (From admission, onward)   Start     Dose/Rate Route Frequency Ordered Stop   03/20/21 0715  cefoTEtan (CEFOTAN) 2 g in sodium chloride 0.9 % 100 mL IVPB        2 g 200 mL/hr over 30 Minutes Intravenous On call to O.R. 03/20/21 [id number] 03/20/21 0951   03/19/21 1400  ertapenem (INVANZ) 1,000 mg in sodium chloride 0.9 % 100 mL IVPB  Status:  Discontinued        1 g 200 mL/hr over 30 Minutes Intravenous Every 24 hours 03/19/21 1224 03/24/21 0751   03/18/21 1200  amoxicillin-clavulanate (AUGMENTIN) 875-125 MG per tablet 1 tablet  Status:  Discontinued        1 tablet Oral Every 12 hours 03/18/21 0759 03/19/21 1224   03/13/21 1400  piperacillin-tazobactam (ZOSYN) IVPB 3.375 g  Status:  Discontinued        3.375 g 12.5 mL/hr over  240 Minutes Intravenous Every 8 hours 03/13/21 0757 03/18/21 0759   03/13/21 0615  piperacillin-tazobactam (ZOSYN) IVPB 3.375 g        3.375 g 12.5 mL/hr over 240 Minutes Intravenous  Once 03/13/21 0604 03/13/21 1247       Assessment/Plan Perforatedsigmoiddiverticulitis POD#9s/p exp lap, ileocecectomy, repair SB serosal tears, LOA, colectomy/end colostomy, 5/28 Dr Kae Heller -CT 6/3 with likely post-op pelvic fluid but no abscess, no ileus or SBO -WOC following for stoma,VAC placed6/3 - MWF - pulm toilet, IS, encourage mobilization  -WBC14.7 (16) - recheck tomorrow -TPN off 6/5. Prealbumin 19.7 - added miralax - poor pain control with changes - recommend pre medicate with pain medicine prior to [facility] changes - add scheduled  tramadol  REV:QWQV diet, ensure  VTE: lovenox ID: Zosyn 5/21>5/26; PO Augmentin 5/26>5/27, Ertapenem 5/27>5/31  HTN HLD GERD LUE edema -resolved,US 5/24 negative for SVT or DVT SevereProtein calorie malnutrition - improved. prealbumin <5(5/30) to 19.7 (6/6)  Disposition: home with [facility] RN for VAC changes in next 24 hours   LOS: 16 days    Winferd Humphrey, [facility] 03/29/2021, 9:00 AM Please see Amion for pager number during day hours 7:00am-4:30pm

## 2021-03-29 NOTE — Consult Note (Signed)
[place] Nurse wound follow up Wound type:midline abdominal incision Measurement:18 cm x 6.2 cm x 4 cm  Wound MBE:MLJQG red, bleeds when foam removed.   Drainage (amount, consistency, odor) bleeding Periwound:LMQ colostomy Dressing procedure/placement/frequency: 1 piece black foam removed.  Cleanse midline wound with NS.  FIll wound bed with NS and pat dry.  Applied 1 piece  black foam to wound. Cover with drape.Change mon/WEd/Fri.  Seal immediately achieved.  [place] Nurse ostomy follow up Patient required IV dilaudid to tolerate VAc dressing change.  Will premedicate with PO pain meds prior to next dressing change. Patient states she is feeling good about ostomy care. She states she is too drowsy now to participate.   Stoma type/location: LMQ colostomy Stomal assessment/size: 1" pink and moist Peristomal assessment: intact  Using barrier ring due to creasing around stoma Treatment options for stomal/peristomal skin: barrier ring and 2 piece pouch Output soft brown stool Ostomy pouching: 2pc. 2 3/4" pouch with barrier ring  Education provided: Unable to participate but states she feels good about pouch change Enrolled patient in [employer] Discharge program: Yes Will follow.   Domenic Moras MSN, RN, FNP-BC CWON Wound, Ostomy, Continence Nurse Pager [phone]

## 2021-03-30 MED ORDER — METHOCARBAMOL 500 MG PO TABS: 500.0000 mg | ORAL_TABLET | Freq: Three times a day (TID) | ORAL | 1 refills | Status: AC

## 2021-03-30 MED ORDER — POLYETHYLENE GLYCOL 3350 17 G PO PACK: 17.0000 g | PACK | Freq: Every day | ORAL | 0 refills | Status: DC | PRN

## 2021-03-30 MED ORDER — OXYCODONE-ACETAMINOPHEN 10-325 MG PO TABS: 1.0000 | ORAL_TABLET | Freq: Four times a day (QID) | ORAL | 0 refills | Status: AC | PRN

## 2021-03-30 NOTE — Progress Notes (Signed)
Occupational Therapy Treatment Patient Details Name: Regina Roberts MRN: [id number] DOB: [birth year] Today's Date: 03/30/2021    History of present illness Pt is a 70 y/o female admitted secondary to worsening abdominal pain on 5/20. Found to have perforated sigmoid diverticulitis and is s/p exploratory laparotomy with colostomy creation on 5/28. PMH includes HTN and hysterectomy.   OT comments  Pt making good progress with functional goals. Session focused on sitting EOB, ADLs seated, getting OOB, functional mobility with and without RW, grooming standing, toilet transfers and toileting tasks. OT will continue to follow acutely to maximize level of function and safety  Follow Up Recommendations  Home health OT    Equipment Recommendations  3 in 1 bedside commode;Tub/shower seat;Other (comment) (reacher, LH bath sponge)    Recommendations for Other Services      Precautions / Restrictions Precautions Precautions: Fall;Other (comment) Precaution Comments: colostomy, abdomen, wound vac applied 6/3 Restrictions Weight Bearing Restrictions: No       Mobility Bed Mobility Overal bed mobility: Modified Independent Bed Mobility: Supine to Sit     Supine to sit: Modified independent (Device/Increase time) Sit to supine: Modified independent (Device/Increase time)   General bed mobility comments: Increased time and HOB elevated.    Transfers Overall transfer level: Needs assistance Equipment used: None;Rolling walker (2 wheeled) Transfers: Sit to/from Stand Sit to Stand: Supervision         General transfer comment: Slow and guarded during session.    Balance Overall balance assessment: Mild deficits observed, not formally tested Sitting-balance support: Feet supported;No upper extremity supported Sitting balance-Leahy Scale: Good     Standing balance support: Bilateral upper extremity supported;No upper extremity supported;Single extremity supported;During  functional activity Standing balance-Leahy Scale: Fair                             ADL either performed or assessed with clinical judgement   ADL Overall ADL's : Needs assistance/impaired     Grooming: Wash/dry hands;Wash/dry face;Standing;Supervision/safety       Lower Body Bathing: Minimal assistance;Sitting/lateral leans Lower Body Bathing Details (indicate cue type and reason): simulated seated EOB Upper Body Dressing : Set up;Sitting   Lower Body Dressing: Moderate assistance   Toilet Transfer: Supervision/safety;Ambulation;Comfort height toilet;Grab bars   Toileting- Clothing Manipulation and Hygiene: Supervision/safety;Sitting/lateral lean;Sit to/from stand       Functional mobility during ADLs: Supervision/safety       Vision Baseline Vision/History: Wears glasses Patient Visual Report: No change from baseline     Perception     Praxis      Cognition Arousal/Alertness: Awake/alert Behavior During Therapy: WFL for tasks assessed/performed Overall Cognitive Status: Within Functional Limits for tasks assessed                                          Exercises     Shoulder Instructions       General Comments      Pertinent Vitals/ Pain       Pain Assessment: Faces Pain Score: 5  Faces Pain Scale: Hurts little more Pain Location: incisional/where wound vac applied Pain Descriptors / Indicators: Grimacing;Guarding;Discomfort Pain Intervention(s): Monitored during session;Repositioned;Patient requesting pain meds-RN notified  Home Living Family/patient expects to be discharged to:: Private residence  Prior Functioning/Environment              Frequency  Min 2X/week        Progress Toward Goals  OT Goals(current goals can now be found in the care plan section)  Progress towards OT goals: Progressing toward goals  Acute Rehab OT Goals Patient Stated  Goal: to go home  Plan Discharge plan remains appropriate    Co-evaluation                 AM-PAC OT "6 Clicks" Daily Activity     Outcome Measure   Help from another person eating meals?: None Help from another person taking care of personal grooming?: A Little Help from another person toileting, which includes using toliet, bedpan, or urinal?: A Little Help from another person bathing (including washing, rinsing, drying)?: A Little Help from another person to put on and taking off regular upper body clothing?: A Little Help from another person to put on and taking off regular lower body clothing?: A Lot 6 Click Score: 18    End of Session Equipment Utilized During Treatment: Gait belt;Rolling walker  OT Visit Diagnosis: Unsteadiness on feet (R26.81);Other abnormalities of gait and mobility (R26.89);Muscle weakness (generalized) (M62.81);Pain Pain - part of body:  (surgical site)   Activity Tolerance Patient tolerated treatment well   Patient Left with call bell/phone within reach;in chair   Nurse Communication          Time: 6333-5456 OT Time Calculation (min): 17 min  Charges: OT General Charges $OT Visit: 1 Visit OT Treatments $Self Care/Home Management : 8-22 mins    Britt Bottom 03/30/2021, 1:17 PM

## 2021-03-30 NOTE — Progress Notes (Signed)
Portable wound vac, bedside commode, walker, and shower stool delivered to room

## 2021-03-30 NOTE — Care Management Important Message (Signed)
Important Message  Patient Details  Name: Regina Roberts MRN: [id number] Date of Birth: 12-26-1950   Medicare Important Message Given:  Yes     Orbie Pyo 03/30/2021, 2:40 PM

## 2021-03-30 NOTE — Progress Notes (Signed)
Discharge instructions given to patient. Patient verbalized understanding. Portable wound vac delivered to patient room.

## 2021-03-30 NOTE — Progress Notes (Signed)
IVT consulted with comment to remove PICC.  No PICC removal order found.  Spoke with RN, Jasmin whom will follow up with MD for order.

## 2021-03-30 NOTE — Progress Notes (Signed)
Physical Therapy Treatment Patient Details Name: Regina Roberts MRN: [id number] DOB: [birth year] Today's Date: 03/30/2021    History of Present Illness Pt is a 70 y/o female admitted secondary to worsening abdominal pain on 5/20. Found to have perforated sigmoid diverticulitis and is s/p exploratory laparotomy with colostomy creation on 5/28. PMH includes HTN and hysterectomy.    PT Comments    Pt seated in recliner and reports feeling uncomfortable.  Pt very motivated to mobilize but remains slow and guarded to pain.  Progressed to stair training and educated to perform walking at home 3x daily.  Plan is for d/c with HHPT.     Follow Up Recommendations  Home health PT;Supervision for mobility/OOB     Equipment Recommendations  3in1 (PT);Rolling walker with 5" wheels    Recommendations for Other Services       Precautions / Restrictions Precautions Precautions: Fall;Other (comment) Precaution Comments: colostomy, abdomen, wound vac applied 6/3 Restrictions Weight Bearing Restrictions: No    Mobility  Bed Mobility Overal bed mobility: Modified Independent Bed Mobility: Sit to Supine       Sit to supine: Modified independent (Device/Increase time)   General bed mobility comments: Increased time and HOB elevated.    Transfers Overall transfer level: Needs assistance Equipment used: None (intermittently pushing IV this session.) Transfers: Sit to/from Stand Sit to Stand: Supervision         General transfer comment: Slow and guarded during session.  Ambulation/Gait Ambulation/Gait assistance: Min guard;Supervision Gait Distance (Feet): 210 Feet Assistive device: None (Initially pushing IV for support but unltimately did not require at end of trial.) Gait Pattern/deviations: Step-through pattern;Trunk flexed Gait velocity: decreased   General Gait Details: Cues for upper trunk control and pacing.  Pt refusing device  with intermittent use of railing and  counter tops for support.   Stairs Stairs: Yes Stairs assistance: Supervision Stair Management: One rail Left Number of Stairs: 5 General stair comments: Cues for sequencing and use of railing.   Wheelchair Mobility    Modified Rankin (Stroke Patients Only)       Balance Overall balance assessment: Mild deficits observed, not formally tested                                          Cognition Arousal/Alertness: Awake/alert Behavior During Therapy: WFL for tasks assessed/performed Overall Cognitive Status: Within Functional Limits for tasks assessed                                        Exercises      General Comments        Pertinent Vitals/Pain Pain Assessment: 0-10 Pain Score: 5  Pain Location: incisional/where wound vac applied Pain Descriptors / Indicators: Grimacing;Guarding;Discomfort Pain Intervention(s): Monitored during session;Repositioned    Home Living                      Prior Function            PT Goals (current goals can now be found in the care plan section) Acute Rehab PT Goals Potential to Achieve Goals: Good Progress towards PT goals: Progressing toward goals    Frequency    Min 3X/week      PT Plan Current plan remains appropriate    Co-evaluation  AM-PAC PT "6 Clicks" Mobility   Outcome Measure  Help needed turning from your back to your side while in a flat bed without using bedrails?: None Help needed moving from lying on your back to sitting on the side of a flat bed without using bedrails?: None Help needed moving to and from a bed to a chair (including a wheelchair)?: A Little Help needed standing up from a chair using your arms (e.g., wheelchair or bedside chair)?: A Little Help needed to walk in hospital room?: A Little Help needed climbing 3-5 steps with a railing? : A Little 6 Click Score: 20    End of Session Equipment Utilized During Treatment:  Gait belt Activity Tolerance: Patient tolerated treatment well;Patient limited by pain Patient left: in bed;with call bell/phone within reach;with bed alarm set Nurse Communication: Mobility status PT Visit Diagnosis: Unsteadiness on feet (R26.81);Muscle weakness (generalized) (M62.81);Difficulty in walking, not elsewhere classified (R26.2);Pain Pain - Right/Left:  (incisional)     Time: 2336-1224 PT Time Calculation (min) (ACUTE ONLY): 25 min  Charges:  $Gait Training: 8-22 mins $Therapeutic Exercise: 8-22 mins                     Erasmo Leventhal , PTA Acute Rehabilitation Services Pager [phone] Office [phone]     Niraj Kudrna Eli Hose 03/30/2021, 12:37 PM

## 2021-03-30 NOTE — Progress Notes (Signed)
Progress Note  10 Days Post-Op  Subjective: CC: feeling okay this morning. Abdominal pain is worst after ambulating but she has only used oral medications for this today. Tolerating diet without nausea or emesis. Ambulating  Objective: Vital signs in last 24 hours: Temp:  [98 F (36.7 C)-98.6 F (37 C)] 98.6 F (37 C) (06/07 0409) Pulse Rate:  [95-97] 95 (06/07 0409) Resp:  [16] 16 (06/07 0409) BP: (118-133)/(69-77) 133/77 (06/07 0409) SpO2:  [96 %-98 %] 98 % (06/07 0409) Last BM Date: 03/29/21  Intake/Output from previous day: 06/06 0701 - 06/07 0700 In: 200 [P.O.:200] Out: 450 [Urine:400; Drains:50] Intake/Output this shift: No intake/output data recorded.  PE: General: pleasant, WD, female who is laying in bed in NAD HEENT: head is normocephalic, atraumatic. Mouth is pink and moist Heart: Palpable radial pulses. No cyanosis Lungs: Respiratory effort nonlabored. No audible wheezing Abd: soft, minimally distended, tender to palpation around incision, Ostomy with air in bag, no stool MS: all 4 extremities are symmetrical with no cyanosis, clubbing, or edema. No calf TTP bilaterally Skin: warm and dry with no masses, lesions, or rashes Psych: A&Ox3 with an appropriate affect.     Lab Results:  Recent Labs    03/28/21 0326 03/29/21 0330  WBC 16.5* 14.7*  HGB 8.9* 8.7*  HCT 27.5* 27.8*  PLT 623* 629*   BMET Recent Labs    03/28/21 0326 03/29/21 0330  NA 135 134*  K 4.4 4.1  CL 102 101  CO2 25 26  GLUCOSE 129* 123*  BUN 16 20  CREATININE 0.81 0.87  CALCIUM 8.9 9.1   PT/INR No results for input(s): LABPROT, INR in the last 72 hours. CMP     Component Value Date/Time   NA 134 (L) 03/29/2021 0330   K 4.1 03/29/2021 0330   CL 101 03/29/2021 0330   CO2 26 03/29/2021 0330   GLUCOSE 123 (H) 03/29/2021 0330   BUN 20 03/29/2021 0330   CREATININE 0.87 03/29/2021 0330   CALCIUM 9.1 03/29/2021 0330   PROT 6.2 (L) 03/29/2021 0330   ALBUMIN 2.6 (L)  03/29/2021 0330   AST 17 03/29/2021 0330   ALT 33 03/29/2021 0330   ALKPHOS 61 03/29/2021 0330   BILITOT 0.3 03/29/2021 0330   GFRNONAA >60 03/29/2021 0330   Lipase     Component Value Date/Time   LIPASE 21 03/12/2021 1959       Studies/Results: No results found.  Anti-infectives: Anti-infectives (From admission, onward)   Start     Dose/Rate Route Frequency Ordered Stop   03/20/21 0715  cefoTEtan (CEFOTAN) 2 g in sodium chloride 0.9 % 100 mL IVPB        2 g 200 mL/hr over 30 Minutes Intravenous On call to O.R. 03/20/21 2800 03/20/21 0951   03/19/21 1400  ertapenem (INVANZ) 1,000 mg in sodium chloride 0.9 % 100 mL IVPB  Status:  Discontinued        1 g 200 mL/hr over 30 Minutes Intravenous Every 24 hours 03/19/21 1224 03/24/21 0751   03/18/21 1200  amoxicillin-clavulanate (AUGMENTIN) 875-125 MG per tablet 1 tablet  Status:  Discontinued        1 tablet Oral Every 12 hours 03/18/21 0759 03/19/21 1224   03/13/21 1400  piperacillin-tazobactam (ZOSYN) IVPB 3.375 g  Status:  Discontinued        3.375 g 12.5 mL/hr over 240 Minutes Intravenous Every 8 hours 03/13/21 0757 03/18/21 0759   03/13/21 0615  piperacillin-tazobactam (ZOSYN) IVPB 3.375 g  3.375 g 12.5 mL/hr over 240 Minutes Intravenous  Once 03/13/21 0604 03/13/21 1247       Assessment/Plan Perforatedsigmoiddiverticulitis POD#10s/p exp lap, ileocecectomy, repair SB serosal tears, LOA, colectomy/end colostomy, 5/28 Dr Kae Heller -CT 6/3 with likely post-op pelvic fluid but no abscess, no ileus or SBO -WOC following for stoma,VAC placed6/3 - MWF - pulm toilet, IS, encourage mobilization  -WBC14.7 (16.5) - improving -TPN off 6/5. Prealbumin 19.7 - added miralax - poor pain control with changes - recommend pre medicate with pain medicine prior to [facility] changes  HFG:BMSX diet, ensure  VTE: lovenox ID: Zosyn 5/21>5/26; PO Augmentin 5/26>5/27, Ertapenem 5/27>5/31  HTN HLD GERD LUE edema -resolved,US  5/24 negative for SVT or DVT SevereProtein calorie malnutrition - improved. prealbumin <5(5/30) to 19.7 (6/6)  Disposition: home with [facility] RN for [facility] changes today   LOS: 17 days    Winferd Humphrey, [facility] 03/30/2021, 9:13 AM Please see Amion for pager number during day hours 7:00am-4:30pm

## 2021-03-30 NOTE — Progress Notes (Signed)
Patient discharged home via wheelchair with all belongings and equipment with husband. Portable wound vac charged and attached before leaving.

## 2021-03-30 NOTE — Progress Notes (Signed)
Instructed the patient on the procedure. HOB less than 45*. Pt held breath for line removal. Pressure held for 5 min. No s/sx of bleeding. Instructed patient to remain in bed for 30 min, leave pressure drsg in place for 24 hours. Keeping it CDI, and report any s/sx of bleeding and apply pressure if needed. Pt and SO VU. Fran Lowes, RN VAST

## 2021-03-30 NOTE — TOC Progression Note (Signed)
Transition of Care [facility]) - Progression Note    Patient Details  Name: Regina Roberts MRN: [id number] Date of Birth: [birth year]  [facility] CM/SW Contact  [name], RN Phone Number: 03/30/2021, 10:51 AM  Clinical Narrative:    Patient for discharge today. Home VAC at bedside. Hospital nurse will contact at discharge. NCM spoke to [facility] with [name] , he is aware discharge is today and [name] will call patient to schedule a time for [facility] dressing change at home tomorrow.   NCM ordered walker, 3 in 1 and tub bench with [place]. DME will come to hospital room today prior to discharge.     Patient voiced understanding.   Expected Discharge Plan: [place]    Expected Discharge Plan and Services Expected Discharge Plan: [place]   Discharge Planning Services: CM Consult Post Acute Care Choice: Home Health,Durable Medical Equipment Living arrangements for the past 2 months: Single Family Home Expected Discharge Date: 03/30/21               DME Arranged: 3-N-1,Tub bench,Walker rolling DME Agency: [employer] Date DME Agency Contacted: 03/24/21 Time DME Agency Contacted: [id number] Representative spoke with at DME Agency: [place]ila [place]: RN,PT [place] Agency: [place] Date [place]: 03/24/21 Time [place]: [id number] Representative spoke with at [place]: [place]) Interventions    Readmission Risk Interventions No flowsheet data found.

## 2021-03-30 NOTE — Discharge Instructions (Signed)
[facility], [place] [phone]  OPEN ABDOMINAL SURGERY: POST OP INSTRUCTIONS  Always review your discharge instruction sheet given to you by the facility where your surgery was performed.  IF YOU HAVE DISABILITY OR FAMILY LEAVE FORMS, YOU MUST BRING THEM TO THE OFFICE FOR PROCESSING.  PLEASE DO NOT GIVE THEM TO YOUR DOCTOR.  1. A prescription for pain medication may be given to you upon discharge.  Take your pain medication as prescribed, if needed.  If narcotic pain medicine is not needed, then you may take acetaminophen (Tylenol) or ibuprofen (Advil) as needed. 2. Take your usually prescribed medications unless otherwise directed. 3. If you need a refill on your pain medication, please contact your pharmacy. They will contact our office to request authorization.  Prescriptions will not be filled after 5pm or on week-ends. 4. You should follow a light diet the first few days after arrival home, such as soup and crackers, pudding, etc.unless your doctor has advised otherwise. A high-fiber, low fat diet can be resumed as tolerated.   Be sure to include lots of fluids daily. Most patients will experience some swelling and bruising on the chest and neck area.  Ice packs will help.  Swelling and bruising can take several days to resolve 5. Most patients will experience some swelling and bruising in the area of the incision. Ice pack will help. Swelling and bruising can take several days to resolve..  6. It is common to experience some constipation if taking pain medication after surgery.  Increasing fluid intake and taking a stool softener will usually help or prevent this problem from occurring.  A mild laxative (Milk of Magnesia or Miralax) should be taken according to package directions if there are no bowel movements after 48 hours. 7.  You may have steri-strips (small skin tapes) in place directly over the incision.  These strips should be left on the skin for 7-10 days.  If your  surgeon used skin glue on the incision, you may shower in 24 hours.  The glue will flake off over the next 2-3 weeks.  Any sutures or staples will be removed at the office during your follow-up visit. You may find that a light gauze bandage over your incision may keep your staples from being rubbed or pulled. You may shower and replace the bandage daily. 8. ACTIVITIES:  You may resume regular (light) daily activities beginning the next day--such as daily self-care, walking, climbing stairs--gradually increasing activities as tolerated.  You may have sexual intercourse when it is comfortable.  Refrain from any heavy lifting or straining until approved by your doctor. a. You may drive when you no longer are taking prescription pain medication, you can comfortably wear a seatbelt, and you can safely maneuver your car and apply brakes  9. You should see your doctor in the office for a follow-up appointment approximately two weeks after your surgery.  Make sure that you call for this appointment within a day or two after you arrive home to insure a convenient appointment time.   WHEN TO CALL YOUR DOCTOR: 1. Fever over 101.0 2. Inability to urinate 3. Nausea and/or vomiting 4. Extreme swelling or bruising 5. Continued bleeding from incision. 6. Increased pain, redness, or drainage from the incision. 7. Difficulty swallowing or breathing 8. Muscle cramping or spasms. 9. Numbness or tingling in hands or feet or around lips.  The clinic staff is available to answer your questions during regular business hours.  Please don't hesitate to call and ask to speak to one of the nurses if you have concerns.  For further questions, please visit [email or web address]   Colostomy Home Guide, Adult  Colostomy surgery is done to create an opening in the front of the abdomen for stool (feces) to leave the body through an ostomy (stoma). Part of the large intestine is attached to the stoma. A bag, also  called a pouch, is fitted over the stoma. Stool and gas will collect in the bag. After surgery, you will need to empty and change your colostomy bag as needed. You will also need to care for your stoma. How to care for the stoma Your stoma should look pink, red, and moist, like the inside of your cheek. Soon after surgery, the stoma may be swollen, but this swelling will go away within 6 weeks. To care for the stoma:  Keep the skin around the stoma clean and dry.  Use a clean, soft washcloth to gently wash the stoma and the skin around it. Clean using a circular motion, and wipe away from the stoma opening, not toward it. ? Use warm water and only use cleansers recommended by your health care provider. ? Rinse the stoma area with plain water. ? Dry the area around the stoma well.  Use stoma powder or ointment on your skin only as told by your health care provider. Do not use any other powders, gels, wipes, or creams on the skin around the stoma.  Check the stoma area every day for signs of infection. Check for: ? New or worsening redness, swelling, or pain. ? New or increased fluid or blood. ? Pus or warmth.  Measure the stoma opening regularly and record the size. Watch for changes. (It is normal for the stoma to get smaller as swelling goes away.) Share this information with your health care provider. How to empty the colostomy bag Empty your bag at bedtime and whenever it is one-third to one-half full. Do not let the bag get more than half-full with stool or gas. The bag could leak if it gets too full. Some colostomy bags have a built-in gas release valve that releases gas often throughout the day. Follow these basic steps: 1. Wash your hands with soap and water. 2. Sit far back on the toilet seat. 3. Put several pieces of toilet paper into the toilet water. This will prevent splashing as you empty stool into the toilet. 4. Remove the clip or the hook-and-loop fastener from the tail end  of the bag. 5. Unroll the tail, then empty the stool into the toilet. 6. Clean the tail with toilet paper or a moist towelette. 7. Reroll the tail, and close it with the clip or the hook-and-loop fastener. 8. Wash your hands again.   How to change the colostomy bag Change your bag every 3-4 days or as often as told by your health care provider. Also change the bag if it is leaking or separating from the skin, or if your skin around the stoma looks or feels irritated. Irritated skin may be a sign that the bag is leaking. Always have colostomy supplies with you, and follow these basic steps: 1. Wash your hands with soap and water. Have paper towels or tissues nearby to clean any discharge. 2. Remove the old bag and skin barrier. Use your fingers or a warm cloth to gently push the skin away from the barrier. 3. Clean the stoma area with water or with mild  soap and water, as directed. Use water to rinse away any soap. 4. Dry the skin. You may use the cool setting on a hair dryer to do this. 5. Use a tracing pattern (template) to cut the skin barrier to the size needed. 6. If you are using a two-piece bag, attach the bag and the skin barrier to each other. Add the barrier ring, if you use one. 7. If directed, apply stoma powder or skin barrier gel to the skin. 8. Warm the skin barrier with your hands, or blow with a hair dryer for 5-10 seconds. 9. Remove the paper from the adhesive strip of the skin barrier. 10. Press the adhesive strip onto the skin around the stoma. 11. Gently rub the skin barrier onto the skin. This creates heat that helps the barrier to stick. 12. Apply stoma tape to the edges of the skin barrier, if desired. 110. Wash your hands again. General recommendations  Avoid wearing tight clothes or having anything press directly on your stoma or bag. Change your clothing whenever it is soiled or damp.  You may shower or bathe with the bag on or off. Do not use harsh or oily soaps or  lotions. Dry the skin and bag after bathing.  Store all supplies in a cool, dry place. Do not leave supplies in extreme heat because some parts can melt or not stick as well.  Whenever you leave home, take extra clothing and an extra skin barrier and bag with you.  If your bag gets wet, you can dry it with a hair dryer on the cool setting.  To prevent odor, you may put drops of ostomy deodorizer in the bag.  If recommended by your health care provider, put ostomy lubricant inside the bag. This helps stool to slide out of the bag more easily and completely. Contact a health care provider if:  You have new or worsening redness, swelling, or pain around your stoma.  You have new or increased fluid or blood coming from your stoma.  Your stoma feels warm to the touch.  You have pus coming from your stoma.  Your stoma extends in or out farther than normal.  You need to change your bag every day.  You have a fever. Get help right away if:  Your stool is bloody.  You have nausea or you vomit.  You have trouble breathing. Summary  Measure your stoma opening regularly and record the size. Watch for changes.  Empty your bag at bedtime and whenever it is one-third to one-half full. Do not let the bag get more than half-full with stool or gas.  Change your bag every 3-4 days or as often as told by your health care provider.  Whenever you leave home, take extra clothing and an extra skin barrier and bag with you. This information is not intended to replace advice given to you by your health care provider. Make sure you discuss any questions you have with your health care provider. Document Revised: 06/11/2020 Document Reviewed: 06/11/2020 Elsevier Patient Education  2021 [place].   Negative Pressure Wound Therapy Home Guide Negative pressure wound therapy (NPWT) uses a sponge or foam-like material (dressing) placed on or inside the wound. The wound is then covered and sealed  with a cover dressing that sticks to your skin (is adhesive). This keeps air out. A tube is attached to the cover dressing, and this tube connects to a small pump. The pump sucks fluid and germs from the  wound. NPWT helps to increase blood flow to the wound and heal it from the inside. What are the risks? NPWT is usually safe to use. However, problems can occur, including:  Skin irritation from the dressing adhesive.  Bleeding.  Infection.  Dehydration. Wounds with large amounts of drainage can cause excessive fluid loss.  Pain. Supplies needed:  A disposable garbage bag.  Soap and water, or hand sanitizer.  Wound cleanser or salt-water solution (saline).  New sponge and cover dressing.  Protective clothing.  Gauze pad.  Vinyl gloves.  Tape.  Skin protectant. This may be a wipe, film, or spray.  Clean or germ-free (sterile) scissors.  Eye protection. How to change your dressing Prepare to change your dressing 1. If told by your health care provider, take pain medicine 30 minutes before changing the dressing. 2. Wash your hands with soap and water. Dry your hands with a clean towel. If soap and water are not available, use hand sanitizer. 3. Set up a clean station for wound care. 4. Open the dressing package so that the sponge dressing remains on the inside of the package. 5. Wear gloves, protective clothing, and eye protection.   Remove old dressing 1. Turn off the pump and disconnect the tubing from the dressing. 2. Carefully remove the adhesive cover dressing in the direction of your hair growth. 3. Remove the sponge dressing that is inside the wound. If the sponge sticks, use a wound cleanser or saline solution to wet the sponge and help it come off more easily. 4. Throw the old sponge and cover dressing supplies into the garbage bag. 5. Remove your gloves by grabbing the cuff and turning the glove inside out. Place the gloves in the trash immediately. 6. Wash your  hands with soap and water. Dry your hands with a clean towel. If soap and water are not available, use hand sanitizer.   Clean your wound  Wear gloves, protective clothing, and eye protection. Follow your health care provider's instructions on how to clean your wound. You may be told to: 1. Clean the wound using a saline solution or a wound cleanser and a clean gauze pad. 2. Pat the wound dry with a gauze pad. Do not rub the wound. 3. Throw the gauze pad into the garbage bag. 4. Remove your gloves by grabbing the cuff and turning the glove inside out. Place the gloves in the trash immediately. 5. Wash your hands with soap and water. Dry your hands with a clean towel. If soap and water are not available, use hand sanitizer. Apply new dressing  Wear gloves, protective clothing, and eye protection. 1. If told by your health care provider, apply a skin protectant to any skin that will be exposed to adhesive. Let the skin protectant dry. 2. Cut a piece of new sponge dressing and put it on or in the wound. 3. Using clean scissors, cut a nickel-sized hole in the new cover dressing. 4. Apply the cover dressing. 5. Attach the suction tube over the hole in the cover dressing. 6. Take off your gloves. Put them in the plastic bag with the old dressing. Tie the bag shut and throw it away. 7. Wash your hands with soap and water. Dry your hands with a clean towel. If soap and water are not available, use hand sanitizer. 8. Turn the pump back on. The sponge dressing should collapse. Do not change the settings on the machine without talking to a health care provider. 9. Replace the  container in the pump that collects fluid if it is full. Replace the container per the manufacturer's instructions or at least once a week, even if it is not full. General tips and recommendations If the alarm sounds:  Stay calm.  Do not turn off the pump or do anything with the dressing.  Reasons the alarm may go off: ? The  battery is low. Change the battery or plug the device into electrical power. ? The dressing has a leak. Find the leak and put tape over the leak. ? The fluid collection container is full. Change the fluid container.  Call your health care provider right away if you cannot fix the problem.  Explain to your health care provider what is happening. Follow his or her instructions. General instructions  Do not turn off the pump unless told to do so by your health care provider.  Do not turn off the pump for more than 2 hours. If the pump is off for more than 2 hours, the dressing will need to be changed.  If your health care provider says it is okay to shower: ? Do not take the pump into the shower. ? Make sure the wound dressing is protected and sealed. The wound dressing must stay dry.  Check frequently that the machine indicates that therapy is on and that all clamps are open.  Do not use over-the-counter medicated or antiseptic creams, sprays, liquids, or dressings unless your health care provider approves. Contact a health care provider if:  You have new pain.  You develop irritation, a rash, or itching around the wound or dressing.  You see new black or yellow tissue in your wound.  The dressing changes are painful or cause bleeding.  The pump has been off for more than 2 hours, and you do not know how to change the dressing.  The pump alarm goes off, and you do not know what to do. Get help right away if:  You have a lot of bleeding.  The wound breaks open.  You have severe pain.  You have signs of infection, such as: ? More redness, swelling, or pain. ? More fluid or blood. ? Warmth. ? Pus or a bad smell. ? Red streaks leading from the wound. ? A fever.  You see a sudden change in the color or texture of the drainage.  You have signs of dehydration, such as: ? Little or no tears, urine, or sweat. ? Muscle cramps. ? Very dry  mouth. ? Headache. ? Dizziness. Summary  Negative pressure wound therapy (NPWT) is a device that helps your wound heal.  Set up a clean station for wound care. Your health care provider will tell you what supplies to use.  Follow your health care provider's instructions on how to clean your wound and how to change the dressing.  Contact a health care provider if you have new pain, an irritation, or a rash, or if the alarm goes off and you do not know what to do.  Get help right away if you have a lot of bleeding, your wound breaks open, or you have severe pain. Also, get help if you have signs of infection. This information is not intended to replace advice given to you by your health care provider. Make sure you discuss any questions you have with your health care provider. Document Revised: 12/16/2019 Document Reviewed: 12/28/2018 Elsevier Patient Education  [place].

## 2021-03-30 NOTE — Discharge Summary (Signed)
[facility] Discharge Summary   Patient ID: Regina Roberts MRN: [id number] DOB/AGE: [birth year] 70 y.o.  Admit date: 03/12/2021 Discharge date: 03/30/2021  Admitting Diagnosis: Perforated diverticulitis  Discharge Diagnosis Problem List Items Addressed This Visit      Other   * (Principal) Diverticulitis    Other Visit Diagnoses    Colostomy in place [facility])    -  Primary   Relevant Orders   Amb Referral to Ostomy Clinic   Acute diverticulitis       Relevant Medications   morphine 4 MG/ML injection 4 mg (Completed)   piperacillin-tazobactam (ZOSYN) IVPB 3.375 g (Completed)   fentaNYL (SUBLIMAZE) 100 MCG/2ML injection (Completed)   oxyCODONE (Oxy IR/ROXICODONE) immediate release tablet 5-10 mg   HYDROmorphone (DILAUDID) injection 0.5-1 mg   oxyCODONE-acetaminophen (PERCOCET) 10-325 MG tablet   Other Relevant Orders   Amb Referral to [facility]   Perforated viscus       Ileus ([place])       Relevant Orders   DG Abd Portable 1V (Completed)      Consultants WOC  Imaging: No results found.  Procedures Dr. Romana Juniper (03/20/21) - Exploratory laparotomy, lysis of adhesions x60 minutes, sigmoid colectomy, mobilization of splenic flexure, creation of end colostomy, ileocecectomy with ileocolonic anastomosis  Hospital Course:  Regina Roberts is a 70 yo female who presented to the [facility] with abdominal pain starting several days prior. Work up was significant for a WBC of 33, CT abd/pelvis showing sigmoid diverticulitis with perforation, but no abscess. General surgery was consulted and admitted her to our service.  She did not improve despite a week of maximal medical therapy and was recommended to proceed with sigmoid colectomy/colostomy. She tolerated procedure well and was transferred to the floor.  Diet was advanced as tolerated and she developed good ostomy output. She had a wound vac placed post operatively and was discharged with this. WOC was consulted  during admission for assistance with wound care and ostomy care/teaching. She had some initial difficulty with pain control - especially with vac changes but this improved and was tolerable with oral medications by date of discharge.   On POD10, the patient was voiding well, tolerating diet, ambulating well, pain well controlled, vital signs stable, and felt stable for discharge home with [name] RN and PT. She was discharged with Lupus vac and DME (walker, tub bench, bedside commode).    Patient will follow up in our office with Dr. Kae Heller in 2 weeks and knows to call with questions or concerns.    She was discharged with robaxin and oxycodone as needed for pain control and miralax as needed for constipation.  I or a member of my team have reviewed this patient in the Controlled Substance Database.   Allergies as of 03/30/2021      Reactions   Lisinopril Other (See Comments)   cough   Phentermine Other (See Comments)   Raynauld's ? Tachycardia      Medication List    TAKE these medications   atorvastatin 10 MG tablet Commonly known as: LIPITOR TAKE 1 TABLET BY MOUTH EVERY DAY*ANNUAL APPT DUE IN APRIL What changed: See the new instructions.   b complex vitamins tablet Take 1 tablet by mouth daily.   CALCIUM PO Take 1 tablet by mouth daily. OTC   Centrum Adults Tabs Take 1 tablet by mouth daily.   [employer] VITAMIN B12 PO Take 1 tablet by mouth daily.   FISH OIL PO Take 1 tablet by mouth  daily.   hydrochlorothiazide 12.5 MG tablet Commonly known as: HYDRODIURIL TAKE 1 TABLET(12.5 MG) BY MOUTH DAILY What changed:   how much to take  how to take this  when to take this  additional instructions   losartan 100 MG tablet Commonly known as: COZAAR Take 1 tablet (100 mg total) by mouth daily.   methocarbamol 500 MG tablet Commonly known as: ROBAXIN Take 1 tablet (500 mg total) by mouth 3 (three) times daily for 10 days.   montelukast 10 MG tablet Commonly known as:  SINGULAIR TAKE 1 TABLET BY MOUTH EVERY DAY   naproxen sodium 220 MG tablet Commonly known as: ALEVE Take 220 mg by mouth as needed (headaches).   oxyCODONE-acetaminophen 10-325 MG tablet Commonly known as: Percocet Take 1 tablet by mouth every 6 (six) hours as needed for up to 5 days for pain.   pantoprazole 40 MG tablet Commonly known as: PROTONIX TAKE 1 TABLET(40 MG) BY MOUTH DAILY What changed: See the new instructions.   polyethylene glycol 17 g packet Commonly known as: MIRALAX / GLYCOLAX Take 17 g by mouth daily as needed for moderate constipation.   triamcinolone ointment 0.5 % Commonly known as: KENALOG apply to affected area twice a day What changed:   how much to take  how to take this  when to take this  reasons to take this  additional instructions   Vitamin D-3 25 MCG (1000 UT) Caps Take 1 capsule by mouth daily.            Durable Medical Equipment  (From admission, onward)         Start     Ordered   03/25/21 1418  For home use only DME Negative pressure wound device  Once       Comments: Wound measured 16 cm x 6 cm x 6 cm on 03/25/21  Question Answer Comment  Frequency of dressing change 3 times per week   Length of need 3 Months   Dressing type Foam   Amount of suction 125 mm/Hg   Pressure application Continuous pressure   Supplies 10 canisters and 15 dressings per month for duration of therapy      03/25/21 1418   03/24/21 1350  For home use only DME Tub bench  Once        03/24/21 1349   03/24/21 1350  For home use only DME 3 n 1  Once        03/24/21 1349   03/24/21 1350  For home use only DME Walker rolling  Once       Question Answer Comment  Walker: With [place]   Patient needs a walker to treat with the following condition Weakness      03/24/21 1349            Follow-up Information    [facility] [place] Follow up.   Specialty: Home Health Services Contact information: [place] [address] [phone]        Regina Riley, MD. Go on 04/16/2021.   Specialty: General Surgery Why: 4:10 PM. Please arrive 30 min prior to appointment time for check in. Bring photo ID and insurance information.  Contact information: [address] [place] [address] [phone]               Signed: Caroll Ranc[facility] 03/30/2021, 10:22 AM Please see Amion for pager number during  day hours 7:00am-4:30pm

## 2021-03-31 DIAGNOSIS — Z433 Encounter for attention to colostomy: Secondary | ICD-10-CM | POA: Diagnosis not present

## 2021-03-31 DIAGNOSIS — I10 Essential (primary) hypertension: Secondary | ICD-10-CM | POA: Diagnosis not present

## 2021-03-31 DIAGNOSIS — M4316 Spondylolisthesis, lumbar region: Secondary | ICD-10-CM | POA: Diagnosis not present

## 2021-03-31 DIAGNOSIS — Z4801 Encounter for change or removal of surgical wound dressing: Secondary | ICD-10-CM | POA: Diagnosis not present

## 2021-03-31 DIAGNOSIS — Z48815 Encounter for surgical aftercare following surgery on the digestive system: Secondary | ICD-10-CM | POA: Diagnosis not present

## 2021-04-01 DIAGNOSIS — Z48815 Encounter for surgical aftercare following surgery on the digestive system: Secondary | ICD-10-CM | POA: Diagnosis not present

## 2021-04-01 DIAGNOSIS — I10 Essential (primary) hypertension: Secondary | ICD-10-CM | POA: Diagnosis not present

## 2021-04-01 DIAGNOSIS — Z433 Encounter for attention to colostomy: Secondary | ICD-10-CM | POA: Diagnosis not present

## 2021-04-01 DIAGNOSIS — M4316 Spondylolisthesis, lumbar region: Secondary | ICD-10-CM | POA: Diagnosis not present

## 2021-04-01 DIAGNOSIS — Z4801 Encounter for change or removal of surgical wound dressing: Secondary | ICD-10-CM | POA: Diagnosis not present

## 2021-04-02 DIAGNOSIS — Z4801 Encounter for change or removal of surgical wound dressing: Secondary | ICD-10-CM | POA: Diagnosis not present

## 2021-04-02 DIAGNOSIS — M4316 Spondylolisthesis, lumbar region: Secondary | ICD-10-CM | POA: Diagnosis not present

## 2021-04-02 DIAGNOSIS — Z433 Encounter for attention to colostomy: Secondary | ICD-10-CM | POA: Diagnosis not present

## 2021-04-02 DIAGNOSIS — Z48815 Encounter for surgical aftercare following surgery on the digestive system: Secondary | ICD-10-CM | POA: Diagnosis not present

## 2021-04-02 DIAGNOSIS — I10 Essential (primary) hypertension: Secondary | ICD-10-CM | POA: Diagnosis not present

## 2021-04-05 DIAGNOSIS — Z4801 Encounter for change or removal of surgical wound dressing: Secondary | ICD-10-CM | POA: Diagnosis not present

## 2021-04-05 DIAGNOSIS — Z48815 Encounter for surgical aftercare following surgery on the digestive system: Secondary | ICD-10-CM | POA: Diagnosis not present

## 2021-04-05 DIAGNOSIS — M4316 Spondylolisthesis, lumbar region: Secondary | ICD-10-CM | POA: Diagnosis not present

## 2021-04-05 DIAGNOSIS — I10 Essential (primary) hypertension: Secondary | ICD-10-CM | POA: Diagnosis not present

## 2021-04-05 DIAGNOSIS — Z433 Encounter for attention to colostomy: Secondary | ICD-10-CM | POA: Diagnosis not present

## 2021-04-06 DIAGNOSIS — Z933 Colostomy status: Secondary | ICD-10-CM | POA: Diagnosis not present

## 2021-04-06 DIAGNOSIS — M4316 Spondylolisthesis, lumbar region: Secondary | ICD-10-CM | POA: Diagnosis not present

## 2021-04-06 DIAGNOSIS — Z4801 Encounter for change or removal of surgical wound dressing: Secondary | ICD-10-CM | POA: Diagnosis not present

## 2021-04-06 DIAGNOSIS — T8131XD Disruption of external operation (surgical) wound, not elsewhere classified, subsequent encounter: Secondary | ICD-10-CM | POA: Diagnosis not present

## 2021-04-06 DIAGNOSIS — S31109A Unspecified open wound of abdominal wall, unspecified quadrant without penetration into peritoneal cavity, initial encounter: Secondary | ICD-10-CM | POA: Diagnosis not present

## 2021-04-06 DIAGNOSIS — Z48815 Encounter for surgical aftercare following surgery on the digestive system: Secondary | ICD-10-CM | POA: Diagnosis not present

## 2021-04-06 DIAGNOSIS — Z433 Encounter for attention to colostomy: Secondary | ICD-10-CM | POA: Diagnosis not present

## 2021-04-06 DIAGNOSIS — I10 Essential (primary) hypertension: Secondary | ICD-10-CM | POA: Diagnosis not present

## 2021-04-06 DIAGNOSIS — K5732 Diverticulitis of large intestine without perforation or abscess without bleeding: Secondary | ICD-10-CM | POA: Diagnosis not present

## 2021-04-07 DIAGNOSIS — Z48815 Encounter for surgical aftercare following surgery on the digestive system: Secondary | ICD-10-CM | POA: Diagnosis not present

## 2021-04-07 DIAGNOSIS — M4316 Spondylolisthesis, lumbar region: Secondary | ICD-10-CM | POA: Diagnosis not present

## 2021-04-07 DIAGNOSIS — Z4801 Encounter for change or removal of surgical wound dressing: Secondary | ICD-10-CM | POA: Diagnosis not present

## 2021-04-07 DIAGNOSIS — I10 Essential (primary) hypertension: Secondary | ICD-10-CM | POA: Diagnosis not present

## 2021-04-07 DIAGNOSIS — Z433 Encounter for attention to colostomy: Secondary | ICD-10-CM | POA: Diagnosis not present

## 2021-04-09 DIAGNOSIS — M4316 Spondylolisthesis, lumbar region: Secondary | ICD-10-CM | POA: Diagnosis not present

## 2021-04-09 DIAGNOSIS — Z433 Encounter for attention to colostomy: Secondary | ICD-10-CM | POA: Diagnosis not present

## 2021-04-09 DIAGNOSIS — Z48815 Encounter for surgical aftercare following surgery on the digestive system: Secondary | ICD-10-CM | POA: Diagnosis not present

## 2021-04-09 DIAGNOSIS — I10 Essential (primary) hypertension: Secondary | ICD-10-CM | POA: Diagnosis not present

## 2021-04-09 DIAGNOSIS — Z4801 Encounter for change or removal of surgical wound dressing: Secondary | ICD-10-CM | POA: Diagnosis not present

## 2021-04-10 DIAGNOSIS — Z433 Encounter for attention to colostomy: Secondary | ICD-10-CM | POA: Diagnosis not present

## 2021-04-10 DIAGNOSIS — Z48815 Encounter for surgical aftercare following surgery on the digestive system: Secondary | ICD-10-CM | POA: Diagnosis not present

## 2021-04-10 DIAGNOSIS — Z4801 Encounter for change or removal of surgical wound dressing: Secondary | ICD-10-CM | POA: Diagnosis not present

## 2021-04-10 DIAGNOSIS — I10 Essential (primary) hypertension: Secondary | ICD-10-CM | POA: Diagnosis not present

## 2021-04-10 DIAGNOSIS — M4316 Spondylolisthesis, lumbar region: Secondary | ICD-10-CM | POA: Diagnosis not present

## 2021-04-12 DIAGNOSIS — Z4801 Encounter for change or removal of surgical wound dressing: Secondary | ICD-10-CM | POA: Diagnosis not present

## 2021-04-12 DIAGNOSIS — I10 Essential (primary) hypertension: Secondary | ICD-10-CM | POA: Diagnosis not present

## 2021-04-12 DIAGNOSIS — M4316 Spondylolisthesis, lumbar region: Secondary | ICD-10-CM | POA: Diagnosis not present

## 2021-04-12 DIAGNOSIS — Z48815 Encounter for surgical aftercare following surgery on the digestive system: Secondary | ICD-10-CM | POA: Diagnosis not present

## 2021-04-12 DIAGNOSIS — Z433 Encounter for attention to colostomy: Secondary | ICD-10-CM | POA: Diagnosis not present

## 2021-04-14 DIAGNOSIS — Z4801 Encounter for change or removal of surgical wound dressing: Secondary | ICD-10-CM | POA: Diagnosis not present

## 2021-04-14 DIAGNOSIS — S31109A Unspecified open wound of abdominal wall, unspecified quadrant without penetration into peritoneal cavity, initial encounter: Secondary | ICD-10-CM | POA: Diagnosis not present

## 2021-04-14 DIAGNOSIS — Z933 Colostomy status: Secondary | ICD-10-CM | POA: Diagnosis not present

## 2021-04-14 DIAGNOSIS — K5732 Diverticulitis of large intestine without perforation or abscess without bleeding: Secondary | ICD-10-CM | POA: Diagnosis not present

## 2021-04-14 DIAGNOSIS — T8131XD Disruption of external operation (surgical) wound, not elsewhere classified, subsequent encounter: Secondary | ICD-10-CM | POA: Diagnosis not present

## 2021-04-14 DIAGNOSIS — I10 Essential (primary) hypertension: Secondary | ICD-10-CM | POA: Diagnosis not present

## 2021-04-14 DIAGNOSIS — M4316 Spondylolisthesis, lumbar region: Secondary | ICD-10-CM | POA: Diagnosis not present

## 2021-04-14 DIAGNOSIS — Z433 Encounter for attention to colostomy: Secondary | ICD-10-CM | POA: Diagnosis not present

## 2021-04-14 DIAGNOSIS — Z48815 Encounter for surgical aftercare following surgery on the digestive system: Secondary | ICD-10-CM | POA: Diagnosis not present

## 2021-04-15 DIAGNOSIS — I10 Essential (primary) hypertension: Secondary | ICD-10-CM | POA: Diagnosis not present

## 2021-04-15 DIAGNOSIS — Z48815 Encounter for surgical aftercare following surgery on the digestive system: Secondary | ICD-10-CM | POA: Diagnosis not present

## 2021-04-15 DIAGNOSIS — Z433 Encounter for attention to colostomy: Secondary | ICD-10-CM | POA: Diagnosis not present

## 2021-04-15 DIAGNOSIS — M4316 Spondylolisthesis, lumbar region: Secondary | ICD-10-CM | POA: Diagnosis not present

## 2021-04-15 DIAGNOSIS — Z4801 Encounter for change or removal of surgical wound dressing: Secondary | ICD-10-CM | POA: Diagnosis not present

## 2021-04-19 DIAGNOSIS — Z433 Encounter for attention to colostomy: Secondary | ICD-10-CM | POA: Diagnosis not present

## 2021-04-19 DIAGNOSIS — M4316 Spondylolisthesis, lumbar region: Secondary | ICD-10-CM | POA: Diagnosis not present

## 2021-04-19 DIAGNOSIS — I10 Essential (primary) hypertension: Secondary | ICD-10-CM | POA: Diagnosis not present

## 2021-04-19 DIAGNOSIS — Z4801 Encounter for change or removal of surgical wound dressing: Secondary | ICD-10-CM | POA: Diagnosis not present

## 2021-04-19 DIAGNOSIS — Z48815 Encounter for surgical aftercare following surgery on the digestive system: Secondary | ICD-10-CM | POA: Diagnosis not present

## 2021-04-21 DIAGNOSIS — Z433 Encounter for attention to colostomy: Secondary | ICD-10-CM | POA: Diagnosis not present

## 2021-04-21 DIAGNOSIS — Z48815 Encounter for surgical aftercare following surgery on the digestive system: Secondary | ICD-10-CM | POA: Diagnosis not present

## 2021-04-21 DIAGNOSIS — I10 Essential (primary) hypertension: Secondary | ICD-10-CM | POA: Diagnosis not present

## 2021-04-21 DIAGNOSIS — Z4801 Encounter for change or removal of surgical wound dressing: Secondary | ICD-10-CM | POA: Diagnosis not present

## 2021-04-21 DIAGNOSIS — M4316 Spondylolisthesis, lumbar region: Secondary | ICD-10-CM | POA: Diagnosis not present

## 2021-04-22 DIAGNOSIS — Z933 Colostomy status: Secondary | ICD-10-CM | POA: Diagnosis not present

## 2021-04-22 DIAGNOSIS — K5732 Diverticulitis of large intestine without perforation or abscess without bleeding: Secondary | ICD-10-CM | POA: Diagnosis not present

## 2021-04-22 DIAGNOSIS — T8131XD Disruption of external operation (surgical) wound, not elsewhere classified, subsequent encounter: Secondary | ICD-10-CM | POA: Diagnosis not present

## 2021-04-22 DIAGNOSIS — S31109A Unspecified open wound of abdominal wall, unspecified quadrant without penetration into peritoneal cavity, initial encounter: Secondary | ICD-10-CM | POA: Diagnosis not present

## 2021-04-23 DIAGNOSIS — M4316 Spondylolisthesis, lumbar region: Secondary | ICD-10-CM | POA: Diagnosis not present

## 2021-04-23 DIAGNOSIS — I10 Essential (primary) hypertension: Secondary | ICD-10-CM | POA: Diagnosis not present

## 2021-04-23 DIAGNOSIS — Z4801 Encounter for change or removal of surgical wound dressing: Secondary | ICD-10-CM | POA: Diagnosis not present

## 2021-04-23 DIAGNOSIS — Z48815 Encounter for surgical aftercare following surgery on the digestive system: Secondary | ICD-10-CM | POA: Diagnosis not present

## 2021-04-23 DIAGNOSIS — Z433 Encounter for attention to colostomy: Secondary | ICD-10-CM | POA: Diagnosis not present

## 2021-04-27 ENCOUNTER — Encounter (HOSPITAL_COMMUNITY)
Admission: RE | Admit: 2021-04-27 | Discharge: 2021-04-27 | Disposition: A | Discharge status: Home / Self Care | Payer: Medicare Other | Source: Ambulatory Visit | Attending: Physician Assistant | Admitting: Physician Assistant

## 2021-04-27 ENCOUNTER — Other Ambulatory Visit: Payer: Self-pay

## 2021-04-27 DIAGNOSIS — K9400 Colostomy complication, unspecified: Secondary | ICD-10-CM | POA: Diagnosis not present

## 2021-04-27 DIAGNOSIS — Z933 Colostomy status: Secondary | ICD-10-CM | POA: Diagnosis not present

## 2021-04-27 DIAGNOSIS — Z01812 Encounter for preprocedural laboratory examination: Secondary | ICD-10-CM | POA: Diagnosis not present

## 2021-04-27 DIAGNOSIS — K5732 Diverticulitis of large intestine without perforation or abscess without bleeding: Secondary | ICD-10-CM | POA: Diagnosis not present

## 2021-04-27 DIAGNOSIS — S31109A Unspecified open wound of abdominal wall, unspecified quadrant without penetration into peritoneal cavity, initial encounter: Secondary | ICD-10-CM | POA: Diagnosis not present

## 2021-04-27 DIAGNOSIS — K5792 Diverticulitis of intestine, part unspecified, without perforation or abscess without bleeding: Secondary | ICD-10-CM | POA: Insufficient documentation

## 2021-04-27 DIAGNOSIS — T8131XD Disruption of external operation (surgical) wound, not elsewhere classified, subsequent encounter: Secondary | ICD-10-CM | POA: Diagnosis not present

## 2021-04-28 DIAGNOSIS — M4316 Spondylolisthesis, lumbar region: Secondary | ICD-10-CM | POA: Diagnosis not present

## 2021-04-28 DIAGNOSIS — Z433 Encounter for attention to colostomy: Secondary | ICD-10-CM | POA: Diagnosis not present

## 2021-04-28 DIAGNOSIS — Z4801 Encounter for change or removal of surgical wound dressing: Secondary | ICD-10-CM | POA: Diagnosis not present

## 2021-04-28 DIAGNOSIS — Z48815 Encounter for surgical aftercare following surgery on the digestive system: Secondary | ICD-10-CM | POA: Diagnosis not present

## 2021-04-28 DIAGNOSIS — I10 Essential (primary) hypertension: Secondary | ICD-10-CM | POA: Diagnosis not present

## 2021-04-28 NOTE — Progress Notes (Signed)
[name]   Reason for visit:  Colostomy leaking.   HPI:  Midline surgical incision, is now tolerating NS moist gauze dressings daily. LMQ colostomy and loses seal when performing her dressing changes.  ROS  Review of Systems  Constitutional:  Negative for fatigue.  Gastrointestinal:        LMQ colostomy  Psychiatric/Behavioral:         Tearful.  Distressed by recent leaks.   Vital signs:  There were no vitals taken for this visit. Exam:  Physical Exam Abdominal:      Stoma type/location:  LMQ colostomy Stomal assessment/size:  1 3/8 inch pink and moist   Peristomal assessment:  intact  creasing at 3 and 9 o'clock. In close proximity to midline incision, will cut pouch offcenter to avoid midline dressing and send patient home with pattern.  She is pleased with this and believes this will solve her issue.  Treatment options for stomal/peristomal skin: barrier ring.  I am switching her to 1 piece convex pouch with belt. Barrier ring around stoma and in creasing for added security Output: soft brown stool.  Ostomy pouching: 1pc convex with barrier ring.  I have flagged the page for the items in [employer] catalog  If she likes the new appliance, we can set her up with them.  She is unclear if HH has done this already or not. Education provided:  See above.  Spoke with husband after visit and he is in agreement with plan.. They request and I provide samples of lubricating deodorant as well and demonstrate how to use it in pouch. They will call me and let me know how this pouch works.     Impression/dx  Colostomy complication Discussion  Switch bag to convex with barrier ring Plan  Follow up on new bag.  Ensure she is set up with medical supply company     Visit time: 60 minutes.   Domenic Moras FNP-BC

## 2021-04-28 NOTE — Discharge Instructions (Signed)
We are switching to one piece convex pouch.  If you like this bag, we will place order with secure start.   Call clinic Friday  [phone]

## 2021-04-29 DIAGNOSIS — Z48815 Encounter for surgical aftercare following surgery on the digestive system: Secondary | ICD-10-CM | POA: Diagnosis not present

## 2021-04-29 DIAGNOSIS — M4316 Spondylolisthesis, lumbar region: Secondary | ICD-10-CM | POA: Diagnosis not present

## 2021-04-29 DIAGNOSIS — I10 Essential (primary) hypertension: Secondary | ICD-10-CM | POA: Diagnosis not present

## 2021-04-29 DIAGNOSIS — Z433 Encounter for attention to colostomy: Secondary | ICD-10-CM | POA: Diagnosis not present

## 2021-04-29 DIAGNOSIS — Z4801 Encounter for change or removal of surgical wound dressing: Secondary | ICD-10-CM | POA: Diagnosis not present

## 2021-04-30 ENCOUNTER — Other Ambulatory Visit: Payer: Self-pay | Admitting: Internal Medicine

## 2021-05-03 DIAGNOSIS — Z433 Encounter for attention to colostomy: Secondary | ICD-10-CM | POA: Diagnosis not present

## 2021-05-03 DIAGNOSIS — Z48815 Encounter for surgical aftercare following surgery on the digestive system: Secondary | ICD-10-CM | POA: Diagnosis not present

## 2021-05-03 DIAGNOSIS — M4316 Spondylolisthesis, lumbar region: Secondary | ICD-10-CM | POA: Diagnosis not present

## 2021-05-03 DIAGNOSIS — Z4801 Encounter for change or removal of surgical wound dressing: Secondary | ICD-10-CM | POA: Diagnosis not present

## 2021-05-03 DIAGNOSIS — I10 Essential (primary) hypertension: Secondary | ICD-10-CM | POA: Diagnosis not present

## 2021-05-04 ENCOUNTER — Telehealth: Payer: Self-pay | Admitting: Internal Medicine

## 2021-05-04 MED ORDER — ATORVASTATIN CALCIUM 10 MG PO TABS: 10.0000 mg | ORAL_TABLET | Freq: Every day | ORAL | 1 refills | Status: DC

## 2021-05-04 MED ORDER — HYDROCHLOROTHIAZIDE 12.5 MG PO TABS: ORAL_TABLET | ORAL | 1 refills | Status: DC

## 2021-05-04 MED ORDER — PANTOPRAZOLE SODIUM 40 MG PO TBEC
40.0000 mg | DELAYED_RELEASE_TABLET | Freq: Every day | ORAL | 1 refills | Status: DC
Start: 2021-05-04 — End: 2022-05-23

## 2021-05-04 MED ORDER — LOSARTAN POTASSIUM 100 MG PO TABS: 100.0000 mg | ORAL_TABLET | Freq: Every day | ORAL | 1 refills | Status: DC

## 2021-05-04 NOTE — Telephone Encounter (Signed)
1.Medication Requested: pantoprazole (PROTONIX) 40 MG tablet  atorvastatin (LIPITOR) 10 MG tablet  hydrochlorothiazide (HYDRODIURIL) 12.5 MG tablet  losartan (COZAAR) 100 MG tablet    2. Pharmacy (Name, Street, [place]): [facility] [phone] - [place], [place] - [address] AT [address]  3. On Med List: yes   4. Last Visit with PCP: 03-08-21  5. Next visit date with PCP: 09-08-21   Agent: Please be advised that RX refills may take up to 3 business days. We ask that you follow-up with your pharmacy.

## 2021-05-04 NOTE — Telephone Encounter (Signed)
Reviewed chart pt is up-to-date sent refills to [facility].../lmb  

## 2021-05-06 DIAGNOSIS — Z933 Colostomy status: Secondary | ICD-10-CM | POA: Diagnosis not present

## 2021-05-07 ENCOUNTER — Other Ambulatory Visit: Payer: Self-pay | Admitting: Internal Medicine

## 2021-05-10 DIAGNOSIS — Z4801 Encounter for change or removal of surgical wound dressing: Secondary | ICD-10-CM | POA: Diagnosis not present

## 2021-05-10 DIAGNOSIS — Z433 Encounter for attention to colostomy: Secondary | ICD-10-CM | POA: Diagnosis not present

## 2021-05-10 DIAGNOSIS — I10 Essential (primary) hypertension: Secondary | ICD-10-CM | POA: Diagnosis not present

## 2021-05-10 DIAGNOSIS — M4316 Spondylolisthesis, lumbar region: Secondary | ICD-10-CM | POA: Diagnosis not present

## 2021-05-10 DIAGNOSIS — Z48815 Encounter for surgical aftercare following surgery on the digestive system: Secondary | ICD-10-CM | POA: Diagnosis not present

## 2021-05-11 DIAGNOSIS — Z933 Colostomy status: Secondary | ICD-10-CM | POA: Diagnosis not present

## 2021-05-11 DIAGNOSIS — S31109A Unspecified open wound of abdominal wall, unspecified quadrant without penetration into peritoneal cavity, initial encounter: Secondary | ICD-10-CM | POA: Diagnosis not present

## 2021-05-11 DIAGNOSIS — T8131XD Disruption of external operation (surgical) wound, not elsewhere classified, subsequent encounter: Secondary | ICD-10-CM | POA: Diagnosis not present

## 2021-05-11 DIAGNOSIS — K5732 Diverticulitis of large intestine without perforation or abscess without bleeding: Secondary | ICD-10-CM | POA: Diagnosis not present

## 2021-05-18 DIAGNOSIS — Z48815 Encounter for surgical aftercare following surgery on the digestive system: Secondary | ICD-10-CM | POA: Diagnosis not present

## 2021-05-18 DIAGNOSIS — M4316 Spondylolisthesis, lumbar region: Secondary | ICD-10-CM | POA: Diagnosis not present

## 2021-05-18 DIAGNOSIS — Z4801 Encounter for change or removal of surgical wound dressing: Secondary | ICD-10-CM | POA: Diagnosis not present

## 2021-05-18 DIAGNOSIS — I10 Essential (primary) hypertension: Secondary | ICD-10-CM | POA: Diagnosis not present

## 2021-05-18 DIAGNOSIS — Z433 Encounter for attention to colostomy: Secondary | ICD-10-CM | POA: Diagnosis not present

## 2021-06-09 DIAGNOSIS — K5732 Diverticulitis of large intestine without perforation or abscess without bleeding: Secondary | ICD-10-CM | POA: Diagnosis not present

## 2021-06-09 DIAGNOSIS — T8131XD Disruption of external operation (surgical) wound, not elsewhere classified, subsequent encounter: Secondary | ICD-10-CM | POA: Diagnosis not present

## 2021-06-09 DIAGNOSIS — Z933 Colostomy status: Secondary | ICD-10-CM | POA: Diagnosis not present

## 2021-06-09 DIAGNOSIS — S31109A Unspecified open wound of abdominal wall, unspecified quadrant without penetration into peritoneal cavity, initial encounter: Secondary | ICD-10-CM | POA: Diagnosis not present

## 2021-07-06 DIAGNOSIS — T8131XD Disruption of external operation (surgical) wound, not elsewhere classified, subsequent encounter: Secondary | ICD-10-CM | POA: Diagnosis not present

## 2021-07-06 DIAGNOSIS — K5732 Diverticulitis of large intestine without perforation or abscess without bleeding: Secondary | ICD-10-CM | POA: Diagnosis not present

## 2021-07-06 DIAGNOSIS — S31109A Unspecified open wound of abdominal wall, unspecified quadrant without penetration into peritoneal cavity, initial encounter: Secondary | ICD-10-CM | POA: Diagnosis not present

## 2021-07-06 DIAGNOSIS — Z933 Colostomy status: Secondary | ICD-10-CM | POA: Diagnosis not present

## 2021-07-08 DIAGNOSIS — K5732 Diverticulitis of large intestine without perforation or abscess without bleeding: Secondary | ICD-10-CM | POA: Diagnosis not present

## 2021-07-08 DIAGNOSIS — T8131XD Disruption of external operation (surgical) wound, not elsewhere classified, subsequent encounter: Secondary | ICD-10-CM | POA: Diagnosis not present

## 2021-07-08 DIAGNOSIS — S31109A Unspecified open wound of abdominal wall, unspecified quadrant without penetration into peritoneal cavity, initial encounter: Secondary | ICD-10-CM | POA: Diagnosis not present

## 2021-07-08 DIAGNOSIS — Z933 Colostomy status: Secondary | ICD-10-CM | POA: Diagnosis not present

## 2021-08-02 ENCOUNTER — Other Ambulatory Visit: Payer: Self-pay | Admitting: Obstetrics & Gynecology

## 2021-08-02 DIAGNOSIS — Z1231 Encounter for screening mammogram for malignant neoplasm of breast: Secondary | ICD-10-CM

## 2021-08-09 DIAGNOSIS — T8131XD Disruption of external operation (surgical) wound, not elsewhere classified, subsequent encounter: Secondary | ICD-10-CM | POA: Diagnosis not present

## 2021-08-09 DIAGNOSIS — K5732 Diverticulitis of large intestine without perforation or abscess without bleeding: Secondary | ICD-10-CM | POA: Diagnosis not present

## 2021-08-09 DIAGNOSIS — S31109A Unspecified open wound of abdominal wall, unspecified quadrant without penetration into peritoneal cavity, initial encounter: Secondary | ICD-10-CM | POA: Diagnosis not present

## 2021-08-09 DIAGNOSIS — Z933 Colostomy status: Secondary | ICD-10-CM | POA: Diagnosis not present

## 2021-08-10 DIAGNOSIS — Z933 Colostomy status: Secondary | ICD-10-CM | POA: Diagnosis not present

## 2021-08-10 DIAGNOSIS — K5732 Diverticulitis of large intestine without perforation or abscess without bleeding: Secondary | ICD-10-CM | POA: Diagnosis not present

## 2021-09-08 ENCOUNTER — Encounter: Payer: Self-pay | Admitting: Internal Medicine

## 2021-09-08 ENCOUNTER — Other Ambulatory Visit: Payer: Self-pay

## 2021-09-08 ENCOUNTER — Ambulatory Visit (INDEPENDENT_AMBULATORY_CARE_PROVIDER_SITE_OTHER): Payer: Medicare Other | Admitting: Internal Medicine

## 2021-09-08 DIAGNOSIS — K5792 Diverticulitis of intestine, part unspecified, without perforation or abscess without bleeding: Secondary | ICD-10-CM | POA: Diagnosis not present

## 2021-09-08 DIAGNOSIS — E785 Hyperlipidemia, unspecified: Secondary | ICD-10-CM | POA: Diagnosis not present

## 2021-09-08 DIAGNOSIS — I10 Essential (primary) hypertension: Secondary | ICD-10-CM | POA: Diagnosis not present

## 2021-09-08 DIAGNOSIS — E669 Obesity, unspecified: Secondary | ICD-10-CM

## 2021-09-08 DIAGNOSIS — Z933 Colostomy status: Secondary | ICD-10-CM | POA: Diagnosis not present

## 2021-09-08 LAB — COMPREHENSIVE METABOLIC PANEL
ALT: 13 U/L (ref 0–35)
AST: 11 U/L (ref 0–37)
Albumin: 4.2 g/dL (ref 3.5–5.2)
Alkaline Phosphatase: 68 U/L (ref 39–117)
BUN: 14 mg/dL (ref 6–23)
CO2: 27 mEq/L (ref 19–32)
Calcium: 9.9 mg/dL (ref 8.4–10.5)
Chloride: 105 mEq/L (ref 96–112)
Creatinine, Ser: 1.03 mg/dL (ref 0.40–1.20)
GFR: 55.47 mL/min — ABNORMAL LOW (ref >60.00)
Glucose, Bld: 106 mg/dL — ABNORMAL HIGH (ref 70–99)
Potassium: 3.6 mEq/L (ref 3.5–5.1)
Sodium: 140 mEq/L (ref 135–145)
Total Bilirubin: 0.4 mg/dL (ref 0.2–1.2)
Total Protein: 7.7 g/dL (ref 6.0–8.3)

## 2021-09-08 LAB — CBC WITH DIFFERENTIAL/PLATELET
Basophils Absolute: 0.1 10*3/uL (ref 0.0–0.1)
Basophils Relative: 0.5 % (ref 0.0–3.0)
Eosinophils Absolute: 0.1 10*3/uL (ref 0.0–0.7)
Eosinophils Relative: 0.8 % (ref 0.0–5.0)
HCT: 34.2 % — ABNORMAL LOW (ref 36.0–46.0)
Hemoglobin: 11 g/dL — ABNORMAL LOW (ref 12.0–15.0)
Lymphocytes Relative: 21.1 % (ref 12.0–46.0)
Lymphs Abs: 2.7 10*3/uL (ref 0.7–4.0)
MCHC: 32.3 g/dL (ref 30.0–36.0)
MCV: 80.7 fl (ref 78.0–100.0)
Monocytes Absolute: 0.9 10*3/uL (ref 0.1–1.0)
Monocytes Relative: 7.4 % (ref 3.0–12.0)
Neutro Abs: 9 10*3/uL — ABNORMAL HIGH (ref 1.4–7.7)
Neutrophils Relative %: 70.2 % (ref 43.0–77.0)
Platelets: 426 10*3/uL — ABNORMAL HIGH (ref 150.0–400.0)
RBC: 4.23 Mil/uL (ref 3.87–5.11)
RDW: 17.6 % — ABNORMAL HIGH (ref 11.5–15.5)
WBC: 12.7 10*3/uL — ABNORMAL HIGH (ref 4.0–10.5)

## 2021-09-08 LAB — TSH: TSH: 0.55 u[IU]/mL (ref 0.35–5.50)

## 2021-09-08 NOTE — Assessment & Plan Note (Signed)
S/p L colostomy 5/22

## 2021-09-08 NOTE — Assessment & Plan Note (Signed)
Stable On Losartan HCT

## 2021-09-08 NOTE — Assessment & Plan Note (Signed)
S/p L colostomy Pt lost 30 lbs post-op, gained 8 lbs

## 2021-09-08 NOTE — Progress Notes (Signed)
Subjective:  Patient ID: Regina Roberts, female    DOB: [birth year]  Age: 70 y.o. MRN: [id number]  CC: Follow-up (6 month f/u)   HPI [facility] presents for a 6 months f/u.  Unfortunately the patient had perforated diverticulitis that required bowel resection and colostomy. S/p colostomy 5/22 - Dr Kae Heller.  It is planned to have reversal in a few months. Follow-up on dyslipidemia, hypertension Outpatient Medications Prior to Visit  Medication Sig Dispense Refill   atorvastatin (LIPITOR) 10 MG tablet Take 1 tablet (10 mg total) by mouth daily. Annual appt due in Nov must see provider for future refills 90 tablet 1   b complex vitamins tablet Take 1 tablet by mouth daily. 100 tablet 3   CALCIUM PO Take 1 tablet by mouth daily. OTC     Cholecalciferol (VITAMIN D-3) 1000 UNITS CAPS Take 1 capsule by mouth daily.     Cyanocobalamin ([employer] VITAMIN B12 PO) Take 1 tablet by mouth daily.     hydrochlorothiazide (HYDRODIURIL) 12.5 MG tablet TAKE 1 TABLET(12.5 MG) BY MOUTH DAILY 90 tablet 1   losartan (COZAAR) 100 MG tablet Take 1 tablet (100 mg total) by mouth daily. Annual appt due in Nov must see provider for future refills 90 tablet 1   montelukast (SINGULAIR) 10 MG tablet TAKE 1 TABLET BY MOUTH EVERY DAY 90 tablet 3   Multiple Vitamins-Minerals (CENTRUM ADULTS) TABS Take 1 tablet by mouth daily.     naproxen sodium (ANAPROX) 220 MG tablet Take 220 mg by mouth as needed (headaches).     Omega-3 Fatty Acids (FISH OIL PO) Take 1 tablet by mouth daily.     pantoprazole (PROTONIX) 40 MG tablet Take 1 tablet (40 mg total) by mouth daily. 90 tablet 1   polyethylene glycol (MIRALAX / GLYCOLAX) 17 g packet Take 17 g by mouth daily as needed for moderate constipation. 14 each 0   triamcinolone ointment (KENALOG) 0.5 % apply to affected area twice a day (Patient taking differently: Apply 1 application topically once as needed (itching).) 60 g 3   No facility-administered medications prior  to visit.    ROS: Review of Systems  Constitutional:  Negative for activity change, appetite change, chills, fatigue and unexpected weight change.  HENT:  Negative for congestion, mouth sores and sinus pressure.   Eyes:  Negative for visual disturbance.  Respiratory:  Negative for cough and chest tightness.   Gastrointestinal:  Negative for abdominal pain and nausea.  Genitourinary:  Negative for difficulty urinating, frequency and vaginal pain.  Musculoskeletal:  Negative for back pain and gait problem.  Skin:  Negative for pallor and rash.  Neurological:  Negative for dizziness, tremors, weakness, numbness and headaches.  Psychiatric/Behavioral:  Negative for confusion and sleep disturbance.    Objective:  BP 130/68 (BP Location: Left Arm)   Pulse (!) 105   Temp 98.3 F (36.8 C) (Oral)   Ht 4' 11.5" (1.511 m)   Wt 172 lb 3.2 oz (78.1 kg)   SpO2 96%   BMI 34.20 kg/m   BP Readings from Last 3 Encounters:  09/08/21 130/68  03/30/21 122/73  03/08/21 130/82    Wt Readings from Last 3 Encounters:  09/08/21 172 lb 3.2 oz (78.1 kg)  03/22/21 186 lb 15.2 oz (84.8 kg)  03/08/21 186 lb 13.9 oz (84.8 kg)    Physical Exam Constitutional:      General: She is not in acute distress.    Appearance: She is well-developed.  HENT:  Head: Normocephalic.     Right Ear: External ear normal.     Left Ear: External ear normal.     Nose: Nose normal.  Eyes:     General:        Right eye: No discharge.        Left eye: No discharge.     Conjunctiva/sclera: Conjunctivae normal.     Pupils: Pupils are equal, round, and reactive to light.  Neck:     Thyroid: No thyromegaly.     Vascular: No JVD.     Trachea: No tracheal deviation.  Cardiovascular:     Rate and Rhythm: Normal rate and regular rhythm.     Heart sounds: Normal heart sounds.  Pulmonary:     Effort: No respiratory distress.     Breath sounds: No stridor. No wheezing.  Abdominal:     General: Bowel sounds are  normal. There is no distension.     Palpations: Abdomen is soft. There is no mass.     Tenderness: There is no abdominal tenderness. There is no guarding or rebound.  Musculoskeletal:        General: No tenderness.     Cervical back: Normal range of motion and neck supple. No rigidity.  Lymphadenopathy:     Cervical: No cervical adenopathy.  Skin:    Findings: No erythema or rash.  Neurological:     Cranial Nerves: No cranial nerve deficit.     Motor: No abnormal muscle tone.     Coordination: Coordination normal.     Deep Tendon Reflexes: Reflexes normal.  Psychiatric:        Behavior: Behavior normal.        Thought Content: Thought content normal.        Judgment: Judgment normal.   Colostomy is in place Lab Results  Component Value Date   WBC 12.7 (H) 09/08/2021   HGB 11.0 (L) 09/08/2021   HCT 34.2 (L) 09/08/2021   PLT 426.0 (H) 09/08/2021   GLUCOSE 106 (H) 09/08/2021   CHOL 159 03/08/2021   TRIG 94 03/22/2021   HDL 38.40 (L) 03/08/2021   LDLCALC 94 03/08/2021   ALT 13 09/08/2021   AST 11 09/08/2021   NA 140 09/08/2021   K 3.6 09/08/2021   CL 105 09/08/2021   CREATININE 1.03 09/08/2021   BUN 14 09/08/2021   CO2 27 09/08/2021   TSH 0.55 09/08/2021   HGBA1C 6.5 03/08/2021    No results found.  Assessment & Plan:   Problem List Items Addressed This Visit     Colostomy in place [facility])    S/p L colostomy 5/22      Relevant Orders   TSH (Completed)   Comprehensive metabolic panel (Completed)   CBC with Differential/Platelet (Completed)   Diverticulitis    S/p L colostomy 5/22       Relevant Orders   TSH (Completed)   Comprehensive metabolic panel (Completed)   CBC with Differential/Platelet (Completed)   Dyslipidemia    Continue Lipitor.      Essential hypertension    Stable On Losartan HCT      Relevant Orders   TSH (Completed)   Comprehensive metabolic panel (Completed)   CBC with Differential/Platelet (Completed)   Obesity, Class II, BMI  35-39.9    S/p L colostomy Pt lost 30 lbs post-op, gained 8 lbs         No orders of the defined types were placed in this encounter.     Follow-up: Return  in about 6 months (around 03/08/2022) for Wellness Exam.  Walker Kehr, MD

## 2021-09-10 ENCOUNTER — Ambulatory Visit
Admission: RE | Admit: 2021-09-10 | Discharge: 2021-09-10 | Disposition: A | Discharge status: Home / Self Care | Payer: Medicare Other | Source: Ambulatory Visit | Attending: Obstetrics & Gynecology | Admitting: Obstetrics & Gynecology

## 2021-09-10 ENCOUNTER — Other Ambulatory Visit: Payer: Self-pay

## 2021-09-10 DIAGNOSIS — Z933 Colostomy status: Secondary | ICD-10-CM | POA: Diagnosis not present

## 2021-09-10 DIAGNOSIS — Z1231 Encounter for screening mammogram for malignant neoplasm of breast: Secondary | ICD-10-CM

## 2021-09-10 DIAGNOSIS — K5732 Diverticulitis of large intestine without perforation or abscess without bleeding: Secondary | ICD-10-CM | POA: Diagnosis not present

## 2021-09-10 DIAGNOSIS — S31109A Unspecified open wound of abdominal wall, unspecified quadrant without penetration into peritoneal cavity, initial encounter: Secondary | ICD-10-CM | POA: Diagnosis not present

## 2021-09-10 DIAGNOSIS — T8131XD Disruption of external operation (surgical) wound, not elsewhere classified, subsequent encounter: Secondary | ICD-10-CM | POA: Diagnosis not present

## 2021-09-12 NOTE — Assessment & Plan Note (Signed)
-  Continue Lipitor °

## 2021-09-14 ENCOUNTER — Other Ambulatory Visit: Payer: Self-pay | Admitting: Obstetrics & Gynecology

## 2021-09-14 DIAGNOSIS — R928 Other abnormal and inconclusive findings on diagnostic imaging of breast: Secondary | ICD-10-CM

## 2021-09-22 ENCOUNTER — Ambulatory Visit: Payer: Medicare Other | Admitting: Obstetrics and Gynecology

## 2021-09-22 ENCOUNTER — Other Ambulatory Visit: Payer: Self-pay

## 2021-09-22 ENCOUNTER — Encounter: Payer: Self-pay | Admitting: Obstetrics & Gynecology

## 2021-09-22 ENCOUNTER — Ambulatory Visit (INDEPENDENT_AMBULATORY_CARE_PROVIDER_SITE_OTHER): Payer: Medicare Other | Admitting: Obstetrics & Gynecology

## 2021-09-22 VITALS — BP 122/80 | HR 96 | Resp 16 | Ht 59.25 in | Wt 171.0 lb

## 2021-09-22 DIAGNOSIS — Z780 Asymptomatic menopausal state: Secondary | ICD-10-CM

## 2021-09-22 DIAGNOSIS — Z01419 Encounter for gynecological examination (general) (routine) without abnormal findings: Secondary | ICD-10-CM | POA: Diagnosis not present

## 2021-09-22 DIAGNOSIS — M8580 Other specified disorders of bone density and structure, unspecified site: Secondary | ICD-10-CM

## 2021-09-22 DIAGNOSIS — N8185 Cervical stump prolapse: Secondary | ICD-10-CM

## 2021-09-22 DIAGNOSIS — Z933 Colostomy status: Secondary | ICD-10-CM

## 2021-09-22 NOTE — Progress Notes (Signed)
[place] Dec 23, 1950 [id number]   History:    70 y.o.   RP:  Established patient presenting for annual gyn exam   HPI: Cervical stump prolapse. Prior supracervical hysterectomy. Using ring pessary with good results. Has been able to remove and cleanse device herself without difficulty. Will continue with use. Pap smear Neg 09/2017. No significant history of abnormal Pap smears.  We discussed current screening guidelines and based on age criteria she is comfortable with not continuing screening. Breasts normal.  Mammogram 08/2021: Rt Neg, Lt Incomplete, scheduled 09/2021 for Lt Dx mammo/US.  Colonoscopy 2018 Neg, 10 yr schedule.  Osteopenia. DEXA 10/2018. T score -1.8, FRAX 3.4% / 0.3%, stable from prior DEXA, will schedule Dexa here now.  Encourage weightbearing exercise, continue with frequent walking and continued calcium and vitamin D supplement. Vit D normal at 45 in 2016. BMI 34.25.  Health labs with Fam MD. Divirticulitis with Intestinal Ostomy bag currently, expecting reanastomosis in 09/2021.   Past medical history,surgical history, family history and social history were all reviewed and documented in the EPIC chart.  Gynecologic History No LMP recorded. Patient has had a hysterectomy.  Obstetric History OB History  Gravida Para Term Preterm AB Living  3 1     1 1   SAB IAB Ectopic Multiple Live Births          2    # Outcome Date GA Lbr Len/2nd Weight Sex Delivery Anes PTL Lv  3 Gravida           2 AB           1 Para              ROS: A ROS was performed and pertinent positives and negatives are included in the history.  GENERAL: No fevers or chills. HEENT: No change in vision, no earache, sore throat or sinus congestion. NECK: No pain or stiffness. CARDIOVASCULAR: No chest pain or pressure. No palpitations. PULMONARY: No shortness of breath, cough or wheeze. GASTROINTESTINAL: No abdominal pain, nausea, vomiting or diarrhea, melena or bright red blood per  rectum. GENITOURINARY: No urinary frequency, urgency, hesitancy or dysuria. MUSCULOSKELETAL: No joint or muscle pain, no back pain, no recent trauma. DERMATOLOGIC: No rash, no itching, no lesions. ENDOCRINE: No polyuria, polydipsia, no heat or cold intolerance. No recent change in weight. HEMATOLOGICAL: No anemia or easy bruising or bleeding. NEUROLOGIC: No headache, seizures, numbness, tingling or weakness. PSYCHIATRIC: No depression, no loss of interest in normal activity or change in sleep pattern.     Exam:   BP 122/80   Pulse 96   Resp 16   Ht 4' 11.25" (1.505 m)   Wt 171 lb (77.6 kg)   BMI 34.25 kg/m   Body mass index is 34.25 kg/m.  General appearance : Well developed well nourished female. No acute distress HEENT: Eyes: no retinal hemorrhage or exudates,  Neck supple, trachea midline, no carotid bruits, no thyroidmegaly Lungs: Clear to auscultation, no rhonchi or wheezes, or rib retractions  Heart: Regular rate and rhythm, no murmurs or gallops Breast:Examined in sitting and supine position were symmetrical in appearance, no palpable masses or tenderness,  no skin retraction, no nipple inversion, no nipple discharge, no skin discoloration, no axillary or supraclavicular lymphadenopathy Abdomen: no palpable masses or tenderness, no rebound or guarding. Ostomy bag. Extremities: no edema or skin discoloration or tenderness  Pelvic: Vulva: Normal             Vagina: No gross lesions  or discharge  Cervix: No gross lesions or discharge  Uterus:  Absent  Adnexa  Without masses or tenderness  Anus: Normal   Assessment/Plan:  71 y.o. female for annual exam   1. Well female exam with routine gynecological exam Cervical stump prolapse. Prior supracervical hysterectomy. Using ring pessary with good results. Has been able to remove and cleanse device herself without difficulty. Will continue with use. Pap smear Neg 09/2017. No significant history of abnormal Pap smears.  We discussed  current screening guidelines and based on age criteria she is comfortable with not continuing screening. Breasts normal.  Mammogram 08/2021: Rt Neg, Lt Incomplete, scheduled 09/2021 for Lt Dx mammo/US.  Colonoscopy 2018 Neg, 10 yr schedule.  Osteopenia. DEXA 10/2018. T score -1.8, FRAX 3.4% / 0.3%, stable from prior DEXA, will schedule Dexa here now.  Encourage weightbearing exercise, continue with frequent walking and continued calcium and vitamin D supplement. Vit D normal at 45 in 2016. BMI 34.25.  Health labs with Fam MD. Divirticulitis with Intestinal Ostomy bag currently, expecting reanastomosis in 09/2021.  2. Postmenopause Well on no HRT.  No PMB. - DG Bone Density; Future  3. Osteopenia, unspecified location Osteopenia. DEXA 10/2018. T score -1.8, FRAX 3.4% / 0.3%, stable from prior DEXA, will schedule Dexa here now.  Encourage weightbearing exercise, continue with frequent walking and continued calcium and vitamin D supplement. Vit D normal at 45 in 2016.   - DG Bone Density; Future  4. Cervical stump prolapse Well with pessary.  5. Colostomy in place [facility])   Princess Bruins MD, 3:53 PM 09/22/2021

## 2021-10-07 ENCOUNTER — Telehealth: Payer: Self-pay | Admitting: Internal Medicine

## 2021-10-07 NOTE — Telephone Encounter (Signed)
N/A unable to leave a message for patient to call me back at [phone] to schedule Medicare Annual Wellness Visit   Last AWV  09/07/20  Please schedule at anytime with [facility] if patient calls the office back.    40 Minutes appointment   Any questions, please call me at [phone]

## 2021-10-11 DIAGNOSIS — Z933 Colostomy status: Secondary | ICD-10-CM | POA: Diagnosis not present

## 2021-10-11 DIAGNOSIS — T8131XD Disruption of external operation (surgical) wound, not elsewhere classified, subsequent encounter: Secondary | ICD-10-CM | POA: Diagnosis not present

## 2021-10-11 DIAGNOSIS — K5732 Diverticulitis of large intestine without perforation or abscess without bleeding: Secondary | ICD-10-CM | POA: Diagnosis not present

## 2021-10-11 DIAGNOSIS — S31109A Unspecified open wound of abdominal wall, unspecified quadrant without penetration into peritoneal cavity, initial encounter: Secondary | ICD-10-CM | POA: Diagnosis not present

## 2021-10-14 ENCOUNTER — Ambulatory Visit
Admission: RE | Admit: 2021-10-14 | Discharge: 2021-10-14 | Disposition: A | Discharge status: Home / Self Care | Payer: Medicare Other | Source: Ambulatory Visit | Attending: Obstetrics & Gynecology | Admitting: Obstetrics & Gynecology

## 2021-10-14 DIAGNOSIS — K9419 Other complications of enterostomy: Secondary | ICD-10-CM | POA: Diagnosis not present

## 2021-10-14 DIAGNOSIS — Z09 Encounter for follow-up examination after completed treatment for conditions other than malignant neoplasm: Secondary | ICD-10-CM | POA: Diagnosis not present

## 2021-10-14 DIAGNOSIS — R928 Other abnormal and inconclusive findings on diagnostic imaging of breast: Secondary | ICD-10-CM

## 2021-10-14 DIAGNOSIS — R922 Inconclusive mammogram: Secondary | ICD-10-CM | POA: Diagnosis not present

## 2021-10-20 ENCOUNTER — Other Ambulatory Visit (HOSPITAL_COMMUNITY): Payer: Self-pay | Admitting: Surgery

## 2021-10-20 ENCOUNTER — Other Ambulatory Visit: Payer: Self-pay | Admitting: Surgery

## 2021-10-20 DIAGNOSIS — K9419 Other complications of enterostomy: Secondary | ICD-10-CM

## 2021-10-28 ENCOUNTER — Ambulatory Visit (HOSPITAL_COMMUNITY)
Admission: RE | Admit: 2021-10-28 | Discharge: 2021-10-28 | Disposition: A | Discharge status: Home / Self Care | Payer: Medicare Other | Source: Ambulatory Visit | Attending: Surgery | Admitting: Surgery

## 2021-10-28 ENCOUNTER — Other Ambulatory Visit: Payer: Self-pay

## 2021-10-28 DIAGNOSIS — K9419 Other complications of enterostomy: Secondary | ICD-10-CM | POA: Diagnosis not present

## 2021-10-28 DIAGNOSIS — Z01818 Encounter for other preprocedural examination: Secondary | ICD-10-CM | POA: Diagnosis not present

## 2021-10-28 MED ORDER — DIATRIZOATE MEGLUMINE & SODIUM 66-10 % PO SOLN
240.0000 mL | Freq: Once | ORAL | Status: AC
  Administered 2021-10-28: 240 mL

## 2021-11-04 ENCOUNTER — Other Ambulatory Visit: Payer: Self-pay | Admitting: Surgery

## 2021-11-04 DIAGNOSIS — K5792 Diverticulitis of intestine, part unspecified, without perforation or abscess without bleeding: Secondary | ICD-10-CM

## 2021-11-04 DIAGNOSIS — Z933 Colostomy status: Secondary | ICD-10-CM

## 2021-11-12 DIAGNOSIS — Z933 Colostomy status: Secondary | ICD-10-CM | POA: Diagnosis not present

## 2021-11-12 DIAGNOSIS — T8131XD Disruption of external operation (surgical) wound, not elsewhere classified, subsequent encounter: Secondary | ICD-10-CM | POA: Diagnosis not present

## 2021-11-12 DIAGNOSIS — S31109A Unspecified open wound of abdominal wall, unspecified quadrant without penetration into peritoneal cavity, initial encounter: Secondary | ICD-10-CM | POA: Diagnosis not present

## 2021-11-12 DIAGNOSIS — K5732 Diverticulitis of large intestine without perforation or abscess without bleeding: Secondary | ICD-10-CM | POA: Diagnosis not present

## 2021-12-01 ENCOUNTER — Other Ambulatory Visit (HOSPITAL_COMMUNITY): Payer: Medicare Other

## 2021-12-10 ENCOUNTER — Inpatient Hospital Stay: Admit: 2021-12-10 | Payer: Medicare Other | Admitting: Surgery

## 2021-12-10 SURGERY — CLOSURE, COLOSTOMY, LAPAROSCOPIC
Anesthesia: General

## 2021-12-13 DIAGNOSIS — Z933 Colostomy status: Secondary | ICD-10-CM | POA: Diagnosis not present

## 2021-12-13 DIAGNOSIS — T8131XD Disruption of external operation (surgical) wound, not elsewhere classified, subsequent encounter: Secondary | ICD-10-CM | POA: Diagnosis not present

## 2021-12-13 DIAGNOSIS — K5732 Diverticulitis of large intestine without perforation or abscess without bleeding: Secondary | ICD-10-CM | POA: Diagnosis not present

## 2021-12-13 DIAGNOSIS — S31109A Unspecified open wound of abdominal wall, unspecified quadrant without penetration into peritoneal cavity, initial encounter: Secondary | ICD-10-CM | POA: Diagnosis not present

## 2022-01-11 DIAGNOSIS — T8131XD Disruption of external operation (surgical) wound, not elsewhere classified, subsequent encounter: Secondary | ICD-10-CM | POA: Diagnosis not present

## 2022-01-11 DIAGNOSIS — K5732 Diverticulitis of large intestine without perforation or abscess without bleeding: Secondary | ICD-10-CM | POA: Diagnosis not present

## 2022-01-11 DIAGNOSIS — Z933 Colostomy status: Secondary | ICD-10-CM | POA: Diagnosis not present

## 2022-01-11 DIAGNOSIS — S31109A Unspecified open wound of abdominal wall, unspecified quadrant without penetration into peritoneal cavity, initial encounter: Secondary | ICD-10-CM | POA: Diagnosis not present

## 2022-01-20 ENCOUNTER — Ambulatory Visit
Admission: RE | Admit: 2022-01-20 | Discharge: 2022-01-20 | Disposition: A | Discharge status: Home / Self Care | Payer: Medicare Other | Source: Ambulatory Visit | Attending: Surgery | Admitting: Surgery

## 2022-01-20 DIAGNOSIS — I700 Atherosclerosis of aorta: Secondary | ICD-10-CM | POA: Diagnosis not present

## 2022-01-20 DIAGNOSIS — K5792 Diverticulitis of intestine, part unspecified, without perforation or abscess without bleeding: Secondary | ICD-10-CM

## 2022-01-20 DIAGNOSIS — Z933 Colostomy status: Secondary | ICD-10-CM

## 2022-01-20 DIAGNOSIS — Z8719 Personal history of other diseases of the digestive system: Secondary | ICD-10-CM | POA: Diagnosis not present

## 2022-01-20 DIAGNOSIS — N281 Cyst of kidney, acquired: Secondary | ICD-10-CM | POA: Diagnosis not present

## 2022-01-20 MED ORDER — IOPAMIDOL (ISOVUE-300) INJECTION 61%
100.0000 mL | Freq: Once | INTRAVENOUS | Status: AC | PRN
  Administered 2022-01-20: 100 mL via INTRAVENOUS

## 2022-02-18 DIAGNOSIS — S31109A Unspecified open wound of abdominal wall, unspecified quadrant without penetration into peritoneal cavity, initial encounter: Secondary | ICD-10-CM | POA: Diagnosis not present

## 2022-02-18 DIAGNOSIS — Z933 Colostomy status: Secondary | ICD-10-CM | POA: Diagnosis not present

## 2022-02-18 DIAGNOSIS — K5732 Diverticulitis of large intestine without perforation or abscess without bleeding: Secondary | ICD-10-CM | POA: Diagnosis not present

## 2022-02-18 DIAGNOSIS — T8131XD Disruption of external operation (surgical) wound, not elsewhere classified, subsequent encounter: Secondary | ICD-10-CM | POA: Diagnosis not present

## 2022-02-28 ENCOUNTER — Ambulatory Visit: Payer: Self-pay | Admitting: Surgery

## 2022-02-28 DIAGNOSIS — K5720 Diverticulitis of large intestine with perforation and abscess without bleeding: Secondary | ICD-10-CM | POA: Diagnosis not present

## 2022-02-28 DIAGNOSIS — K632 Fistula of intestine: Secondary | ICD-10-CM | POA: Insufficient documentation

## 2022-02-28 DIAGNOSIS — Z933 Colostomy status: Secondary | ICD-10-CM | POA: Diagnosis not present

## 2022-02-28 HISTORY — DX: Diverticulitis of large intestine with perforation and abscess without bleeding: K57.20

## 2022-03-09 ENCOUNTER — Other Ambulatory Visit: Payer: Self-pay | Admitting: Urology

## 2022-03-09 ENCOUNTER — Ambulatory Visit: Payer: Medicare Other | Admitting: Internal Medicine

## 2022-03-16 ENCOUNTER — Ambulatory Visit (INDEPENDENT_AMBULATORY_CARE_PROVIDER_SITE_OTHER): Payer: Medicare Other | Admitting: Internal Medicine

## 2022-03-16 ENCOUNTER — Ambulatory Visit (INDEPENDENT_AMBULATORY_CARE_PROVIDER_SITE_OTHER): Payer: Medicare Other

## 2022-03-16 ENCOUNTER — Encounter: Payer: Self-pay | Admitting: Internal Medicine

## 2022-03-16 VITALS — BP 120/78 | HR 86 | Temp 99.3°F | Ht 59.25 in | Wt 177.2 lb

## 2022-03-16 DIAGNOSIS — Z933 Colostomy status: Secondary | ICD-10-CM

## 2022-03-16 DIAGNOSIS — E669 Obesity, unspecified: Secondary | ICD-10-CM

## 2022-03-16 DIAGNOSIS — I10 Essential (primary) hypertension: Secondary | ICD-10-CM

## 2022-03-16 DIAGNOSIS — R739 Hyperglycemia, unspecified: Secondary | ICD-10-CM | POA: Diagnosis not present

## 2022-03-16 DIAGNOSIS — Z0000 Encounter for general adult medical examination without abnormal findings: Secondary | ICD-10-CM | POA: Diagnosis not present

## 2022-03-16 NOTE — Assessment & Plan Note (Addendum)
BP Readings from Last 3 Encounters:  03/16/22 120/78  09/22/21 122/80  09/08/21 130/68  On Losartan and HCTZ Labs pre-op -- planned

## 2022-03-16 NOTE — Assessment & Plan Note (Signed)
Check A1c. 

## 2022-03-16 NOTE — Progress Notes (Addendum)
I connected with Debbe Odea today by telephone and verified that I am speaking with the correct person using two identifiers. Location patient: home Location provider: work Persons participating in the virtual visit: patient, provider.   I discussed the limitations, risks, security and privacy concerns of performing an evaluation and management service by telephone and the availability of in person appointments. I also discussed with the patient that there may be a patient responsible charge related to this service. The patient expressed understanding and verbally consented to this telephonic visit.    Interactive audio and video telecommunications were attempted between this provider and patient, however failed, due to patient having technical difficulties OR patient did not have access to video capability.  We continued and completed visit with audio only.  Some vital signs may be absent or patient reported.   Time Spent with patient on telephone encounter: 30 minutes  Subjective:   Regina Roberts is a 71 y.o. female who presents for Medicare Annual (Subsequent) preventive examination.  Review of Systems     Cardiac Risk Factors include: advanced age (>5mn, >>27women);dyslipidemia;family history of premature cardiovascular disease;hypertension;obesity (BMI >30kg/m2)     Objective:    Today's Vitals   03/16/22 1432  BP: 120/78  Pulse: 86  Temp: 99.3 F (37.4 C)  SpO2: 95%  Weight: 177 lb 4 oz (80.4 kg)  Height: 4' 11.25" (1.505 m)  PainSc: 0-No pain   Body mass index is 35.5 kg/m.     03/16/2022    2:44 PM 03/12/2021    7:59 PM 09/07/2020   11:32 AM 09/05/2019    2:08 PM 08/27/2018    9:31 AM 07/20/2017    2:52 PM  Advanced Directives  Does Patient Have a Medical Advance Directive? No No No No No No  Does patient want to make changes to medical advance directive?    No - Patient declined Yes (ED - Information included in AVS)   Would patient like  information on creating a medical advance directive? No - Patient declined No - Patient declined No - Patient declined       Current Medications (verified) Outpatient Encounter Medications as of 03/16/2022  Medication Sig   atorvastatin (LIPITOR) 10 MG tablet Take 1 tablet (10 mg total) by mouth daily. Annual appt due in Nov must see provider for future refills   b complex vitamins tablet Take 1 tablet by mouth daily.   bisacodyl (DULCOLAX) 5 MG EC tablet Take by mouth.   CALCIUM PO Take 1 tablet by mouth daily. OTC   Cholecalciferol (VITAMIN D-3) 1000 UNITS CAPS Take 1 capsule by mouth daily.   Cyanocobalamin ([employer] VITAMIN B12 PO) Take 1 tablet by mouth daily.   hydrochlorothiazide (HYDRODIURIL) 12.5 MG tablet TAKE 1 TABLET(12.5 MG) BY MOUTH DAILY   losartan (COZAAR) 100 MG tablet Take 1 tablet (100 mg total) by mouth daily. Annual appt due in Nov must see provider for future refills   metroNIDAZOLE (FLAGYL) 500 MG tablet Take 500 mg by mouth 2 (two) times daily.   montelukast (SINGULAIR) 10 MG tablet TAKE 1 TABLET BY MOUTH EVERY DAY   Multiple Vitamins-Minerals (CENTRUM ADULTS) TABS Take 1 tablet by mouth daily.   naproxen sodium (ANAPROX) 220 MG tablet Take 220 mg by mouth as needed (headaches).   neomycin (MYCIFRADIN) 500 MG tablet Take 1,000 mg by mouth 3 (three) times daily.   Omega-3 Fatty Acids (FISH OIL PO) Take 1 tablet by mouth daily.   pantoprazole (PROTONIX) 40 MG  tablet Take 1 tablet (40 mg total) by mouth daily.   triamcinolone ointment (KENALOG) 0.5 % apply to affected area twice a day (Patient taking differently: Apply 1 application. topically once as needed (itching).)   No facility-administered encounter medications on file as of 03/16/2022.    Allergies (verified) Lisinopril and Phentermine   History: Past Medical History:  Diagnosis Date   Allergy    Anemia    Blood transfusion without reported diagnosis    GERD (gastroesophageal reflux disease)    Hyperlipidemia     Hypertension    Osteopenia 10/2018   T score -1.8 FRAX 3.4% / 0.3% able from prior DEXA   Past Surgical History:  Procedure Laterality Date   HERNIA REPAIR     LAPAROTOMY N/A 03/20/2021   Procedure: LAPAROTOMY;  Surgeon: Clovis Riley, MD;  Location: [place];  Service: General;  Laterality: N/A;  180   PARTIAL COLECTOMY N/A 03/20/2021   Procedure: PARTIAL COLECTOMY WITH COLOSTOMY;  Surgeon: Clovis Riley, MD;  Location: [facility];  Service: General;  Laterality: N/A;   [place]   SUPRACERVICAL ABDOMINAL HYSTERECTOMY  2004   with RSO. Leiomyoma menorrhagia with 16-18 week size uterus   Family History  Problem Relation Age of Onset   Heart disease Mother 46   Cancer Mother 25       neck ca    Stroke Father 36   Other Brother        train accident   Other Brother        respiratory   Other Brother        liver failure ?   Breast cancer Maternal Grandmother        untreated   Other Daughter        suicide   Pancreatic cancer Neg Hx    Rectal cancer Neg Hx    Stomach cancer Neg Hx    Social History   Socioeconomic History   Marital status: Married    Spouse name: Not on file   Number of children: 1   Years of education: Not on file   Highest education level: Not on file  Occupational History   Occupation: retired  Tobacco Use   Smoking status: Never   Smokeless tobacco: Never  Vaping Use   Vaping Use: Never used  Substance and Sexual Activity   Alcohol use: Not Currently   Drug use: No   Sexual activity: Not Currently    Partners: Male    Birth control/protection: Surgical    Comment: 1st intercourse 18 yo-5 partners, hysterectomy  Other Topics Concern   Not on file  Social History Narrative   Not on file   Social Determinants of Health   Financial Resource Strain: Low Risk    Difficulty of Paying Living Expenses: Not hard at all  Food Insecurity: No Food Insecurity   Worried About Charity fundraiser in the Last Year: Never true    [place] in the Last Year: Never true  Transportation Needs: No Transportation Needs   Lack of Transportation (Medical): No   Lack of Transportation (Non-Medical): No  Physical Activity: Inactive   Days of Exercise per Week: 0 days   Minutes of Exercise per Session: 0 min  Stress: No Stress Concern Present   Feeling of Stress : Not at all  Social Connections: Socially Integrated   Frequency of Communication with Friends and Family: More than three times a week   Frequency of  Social Gatherings with Friends and Family: Once a week   Attends Religious Services: More than 4 times per year   Active Member of [employer] or Organizations: Yes   Attends Music therapist: More than 4 times per year   Marital Status: Married    Tobacco Counseling Counseling given: Not Answered   Clinical Intake:  Pre-visit preparation completed: Yes  Pain : No/denies pain Pain Score: 0-No pain     BMI - recorded: 35.5 Nutritional Risks: None Diabetes: No  How often do you need to have someone help you when you read instructions, pamphlets, or other written materials from your doctor or pharmacy?: 1 - Never What is the last grade level you completed in school?: 2 years of college  Diabetic? no  Interpreter Needed?: No  Information entered by :: Lisette Abu, LPN   Activities of Daily Living    03/16/2022    2:45 PM  In your present state of health, do you have any difficulty performing the following activities:  Hearing? 0  Vision? 0  Difficulty concentrating or making decisions? 0  Walking or climbing stairs? 0  Dressing or bathing? 0  Doing errands, shopping? 0  Preparing Food and eating ? N  Using the Toilet? N  In the past six months, have you accidently leaked urine? N  Do you have problems with loss of bowel control? N  Managing your Medications? N  Managing your Finances? N  Housekeeping or managing your Housekeeping? N    Patient Care Team: Plotnikov,  Evie Lacks, MD as PCP - General Fontaine, Belinda Block, MD (Inactive) as Consulting Physician (Gynecology) Pyrtle, Lajuan Lines, MD as Consulting Physician (Gastroenterology) Clovis Riley, MD as Consulting Physician (General Surgery) Rutherford Guys, MD as Consulting Physician (Ophthalmology)  Indicate any recent Medical Services you may have received from other than [facility] providers in the past year (date may be approximate).     Assessment:   This is a routine wellness examination for [facility].  Hearing/Vision screen Hearing Screening - Comments:: Patient denied any hearing difficulty.   No hearing aids.  Vision Screening - Comments:: Patient wears readers for small print. Eye exam done by: Rutherford Guys, MD.  Dietary issues and exercise activities discussed: Current Exercise Habits: The patient does not participate in regular exercise at present   Goals Addressed             This Visit's Progress    My goal is to get back to being physically active after my surgery.        Depression Screen    03/16/2022    2:45 PM 03/16/2022   10:02 AM 09/08/2021   10:56 AM 09/07/2020   11:30 AM 09/05/2019    2:11 PM 08/27/2018    9:36 AM 08/21/2018    8:41 AM  PHQ 2/9 Scores  PHQ - 2 Score 0 0 0 0 0 0 0  PHQ- 9 Score   1        Fall Risk    03/16/2022    2:45 PM 03/16/2022   10:01 AM 09/08/2021   10:56 AM 09/07/2020   11:32 AM 09/05/2019    2:08 PM  Fall Risk   Falls in the past year? 0 0 0 0 0  Number falls in past yr: 0 0 0 0 0  Injury with Fall? 0 0 0 0 0  Risk for fall due to : No Fall Risks  No Fall Risks No Fall Risks   Follow  up Falls evaluation completed   Falls evaluation completed;Education provided     FALL RISK PREVENTION PERTAINING TO THE HOME:  Any stairs in or around the home? No  If so, are there any without handrails? No  Home free of loose throw rugs in walkways, pet beds, electrical cords, etc? Yes  Adequate lighting in your home to reduce risk of falls? Yes    ASSISTIVE DEVICES UTILIZED TO PREVENT FALLS:  Life alert? No  Use of a cane, walker or w/c? No  Grab bars in the bathroom? No  Shower chair or bench in shower? Yes  Elevated toilet seat or a handicapped toilet? Yes   TIMED UP AND GO:  Was the test performed? No .  Length of time to ambulate 10 feet: n/a sec.   Appearance of gait: Patient not evaluated for gait during this visit.  Cognitive Function:        03/16/2022    2:46 PM 09/07/2020   11:33 AM  6CIT Screen  What Year? 0 points 0 points  What month? 0 points 0 points  What time? 0 points 0 points  Count back from 20 0 points 0 points  Months in reverse 0 points 0 points  Repeat phrase 0 points 0 points  Total Score 0 points 0 points    Immunizations Immunization History  Administered Date(s) Administered   Fluad Quad(high Dose 65+) 07/29/2019, 08/05/2020   Influenza Whole 07/16/2008   Influenza, High Dose Seasonal PF 07/16/2015, 08/03/2018, 08/06/2021   Influenza,inj,Quad PF,6+ Mos 07/22/2016   Influenza-Unspecified 08/13/2014, 09/07/2017   PFIZER(Purple Top)SARS-COV-2 Vaccination 12/21/2019, 01/11/2020, 08/06/2020   Pfizer Covid-19 Vaccine Bivalent Booster 55yr & up 08/06/2021   Pneumococcal Conjugate-13 08/27/2018   Pneumococcal Polysaccharide-23 09/07/2020   Tdap 08/01/2016   Zoster Recombinat (Shingrix) 11/19/2019, 06/20/2020    TDAP status: Up to date  Flu Vaccine status: Up to date  Pneumococcal vaccine status: Up to date  Covid-19 vaccine status: Completed vaccines  Qualifies for Shingles Vaccine? Yes   Zostavax completed No   Shingrix Completed?: Yes  Screening Tests Health Maintenance  Topic Date Due   Hepatitis C Screening  Never done   INFLUENZA VACCINE  05/24/2022   MAMMOGRAM  09/11/2023   TETANUS/TDAP  08/01/2026   COLONOSCOPY (Pts 45-458yrInsurance coverage will need to be confirmed)  08/04/2027   Pneumonia Vaccine 6519Years old  Completed   DEXA SCAN  Completed   COVID-19  Vaccine  Completed   Zoster Vaccines- Shingrix  Completed   HPV VACCINES  Aged Out    Health Maintenance  Health Maintenance Due  Topic Date Due   Hepatitis C Screening  Never done    Colorectal cancer screening: Type of screening: Colonoscopy. Completed 08/03/2017. Repeat every 10 years  Mammogram status: Completed 10/14/2021. Repeat every year  Bone Density status: Completed 11/20/2018. Results reflect: Bone density results: OSTEOPENIA. Repeat every 2 years.  Lung Cancer Screening: (Low Dose CT Chest recommended if Age 70-80ears, 30 pack-year currently smoking OR have quit w/in 15years.) does not qualify.   Lung Cancer Screening Referral: no  Additional Screening:  Hepatitis C Screening: does qualify; Completed no  Vision Screening: Recommended annual ophthalmology exams for early detection of glaucoma and other disorders of the eye. Is the patient up to date with their annual eye exam?  Yes  Who is the provider or what is the name of the office in which the patient attends annual eye exams? [facility]. If pt is not established with  a provider, would they like to be referred to a provider to establish care? No .   Dental Screening: Recommended annual dental exams for proper oral hygiene  Community Resource Referral / Chronic Care Management: CRR required this visit?  No   CCM required this visit?  No      Plan:     I have personally reviewed and noted the following in the patient's chart:   Medical and social history Use of alcohol, tobacco or illicit drugs  Current medications and supplements including opioid prescriptions.  Functional ability and status Nutritional status Physical activity Advanced directives List of other physicians Hospitalizations, surgeries, and ER visits in previous 12 months Vitals Screenings to include cognitive, depression, and falls Referrals and appointments  In addition, I have reviewed and discussed with patient certain  preventive protocols, quality metrics, and best practice recommendations. A written personalized care plan for preventive services as well as general preventive health recommendations were provided to patient.     Sheral Flow, LPN   2/48/2500   Nurse Notes:  There were no vitals filed for this visit. There is no height or weight on file to calculate BMI. Patient stated that she has no issues with gait or balance; does not use any assistive devices. Medications reviewed with patient; no opioid use noted.   Medical screening examination/treatment/procedure(s) were performed by non-physician practitioner and as supervising physician I was immediately available for consultation/collaboration.  I agree with above. Lew Dawes, MD

## 2022-03-16 NOTE — Progress Notes (Signed)
Subjective:  Patient ID: Regina Roberts, female    DOB: [birth year]  Age: 72 y.o. MRN: [id number]  CC: Follow-up   HPI Babetta Paterson presents for a stoma - reversal in June 2023, dyslipidemia, GERD. C/o stiffness  Outpatient Medications Prior to Visit  Medication Sig Dispense Refill   atorvastatin (LIPITOR) 10 MG tablet Take 1 tablet (10 mg total) by mouth daily. Annual appt due in Nov must see provider for future refills 90 tablet 1   b complex vitamins tablet Take 1 tablet by mouth daily. 100 tablet 3   bisacodyl (DULCOLAX) 5 MG EC tablet Take by mouth.     CALCIUM PO Take 1 tablet by mouth daily. OTC     Cholecalciferol (VITAMIN D-3) 1000 UNITS CAPS Take 1 capsule by mouth daily.     Cyanocobalamin ([employer] VITAMIN B12 PO) Take 1 tablet by mouth daily.     hydrochlorothiazide (HYDRODIURIL) 12.5 MG tablet TAKE 1 TABLET(12.5 MG) BY MOUTH DAILY 90 tablet 1   losartan (COZAAR) 100 MG tablet Take 1 tablet (100 mg total) by mouth daily. Annual appt due in Nov must see provider for future refills 90 tablet 1   metroNIDAZOLE (FLAGYL) 500 MG tablet Take 500 mg by mouth 2 (two) times daily.     montelukast (SINGULAIR) 10 MG tablet TAKE 1 TABLET BY MOUTH EVERY DAY 90 tablet 3   Multiple Vitamins-Minerals (CENTRUM ADULTS) TABS Take 1 tablet by mouth daily.     naproxen sodium (ANAPROX) 220 MG tablet Take 220 mg by mouth as needed (headaches).     neomycin (MYCIFRADIN) 500 MG tablet Take 1,000 mg by mouth 3 (three) times daily.     Omega-3 Fatty Acids (FISH OIL PO) Take 1 tablet by mouth daily.     pantoprazole (PROTONIX) 40 MG tablet Take 1 tablet (40 mg total) by mouth daily. 90 tablet 1   triamcinolone ointment (KENALOG) 0.5 % apply to affected area twice a day (Patient taking differently: Apply 1 application. topically once as needed (itching).) 60 g 3   No facility-administered medications prior to visit.    ROS: Review of Systems  Constitutional:  Negative for activity  change, appetite change, chills, fatigue and unexpected weight change.  HENT:  Negative for congestion, mouth sores and sinus pressure.   Eyes:  Negative for visual disturbance.  Respiratory:  Negative for cough and chest tightness.   Gastrointestinal:  Negative for abdominal pain and nausea.  Genitourinary:  Negative for difficulty urinating, frequency and vaginal pain.  Musculoskeletal:  Positive for arthralgias. Negative for back pain and gait problem.  Skin:  Negative for pallor and rash.  Neurological:  Negative for dizziness, tremors, weakness, numbness and headaches.  Psychiatric/Behavioral:  Negative for confusion and sleep disturbance.    Objective:  BP 120/78 (BP Location: Left Arm, Patient Position: Sitting, Cuff Size: Normal)   Pulse 86   Temp 99.3 F (37.4 C) (Oral)   Ht 4' 11.25" (1.505 m)   Wt 177 lb 3.2 oz (80.4 kg)   SpO2 95%   BMI 35.49 kg/m   BP Readings from Last 3 Encounters:  03/16/22 120/78  09/22/21 122/80  09/08/21 130/68    Wt Readings from Last 3 Encounters:  03/16/22 177 lb 3.2 oz (80.4 kg)  09/22/21 171 lb (77.6 kg)  09/08/21 172 lb 3.2 oz (78.1 kg)    Physical Exam Constitutional:      General: She is not in acute distress.    Appearance: She is well-developed. She  is obese.  HENT:     Head: Normocephalic.     Right Ear: External ear normal.     Left Ear: External ear normal.     Nose: Nose normal.  Eyes:     General:        Right eye: No discharge.        Left eye: No discharge.     Conjunctiva/sclera: Conjunctivae normal.     Pupils: Pupils are equal, round, and reactive to light.  Neck:     Thyroid: No thyromegaly.     Vascular: No JVD.     Trachea: No tracheal deviation.  Cardiovascular:     Rate and Rhythm: Normal rate and regular rhythm.     Heart sounds: Normal heart sounds.  Pulmonary:     Effort: No respiratory distress.     Breath sounds: No stridor. No wheezing.  Abdominal:     General: Bowel sounds are normal. There  is no distension.     Palpations: Abdomen is soft. There is no mass.     Tenderness: There is no abdominal tenderness. There is no guarding or rebound.  Musculoskeletal:        General: No tenderness.     Cervical back: Normal range of motion and neck supple. No rigidity.  Lymphadenopathy:     Cervical: No cervical adenopathy.  Skin:    Findings: No erythema or rash.  Neurological:     Cranial Nerves: No cranial nerve deficit.     Motor: No abnormal muscle tone.     Coordination: Coordination normal.     Deep Tendon Reflexes: Reflexes normal.  Psychiatric:        Behavior: Behavior normal.        Thought Content: Thought content normal.        Judgment: Judgment normal.  stoma  Lab Results  Component Value Date   WBC 12.7 (H) 09/08/2021   HGB 11.0 (L) 09/08/2021   HCT 34.2 (L) 09/08/2021   PLT 426.0 (H) 09/08/2021   GLUCOSE 106 (H) 09/08/2021   CHOL 159 03/08/2021   TRIG 94 03/22/2021   HDL 38.40 (L) 03/08/2021   LDLCALC 94 03/08/2021   ALT 13 09/08/2021   AST 11 09/08/2021   NA 140 09/08/2021   K 3.6 09/08/2021   CL 105 09/08/2021   CREATININE 1.03 09/08/2021   BUN 14 09/08/2021   CO2 27 09/08/2021   TSH 0.55 09/08/2021   HGBA1C 6.5 03/08/2021    CT ABDOMEN PELVIS W CONTRAST  Addendum Date: 01/24/2022   ADDENDUM REPORT: 01/24/2022 20:40 ADDENDUM: Comparison made to prior barium enema from 10/28/2021. There is a small amount of contrast noted in the rectal stump. 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 Electronically Signed   By: Rolm Baptise M.D.   On: 01/24/2022 20:40   Result Date: 01/24/2022 CLINICAL DATA:  History of diverticulitis and colostomy. EXAM: CT ABDOMEN AND PELVIS WITH CONTRAST TECHNIQUE: Multidetector CT imaging of the abdomen and pelvis was performed using the standard protocol following bolus administration of intravenous contrast. RADIATION DOSE REDUCTION: This exam was performed according to the  departmental dose-optimization program which includes automated exposure control, adjustment of the mA and/or kV according to patient size and/or use of iterative reconstruction technique. CONTRAST:  150m ISOVUE-300 IOPAMIDOL (ISOVUE-300) INJECTION 61% COMPARISON:  None. FINDINGS: Lower chest: No acute abnormality Hepatobiliary: No focal hepatic abnormality. Gallbladder unremarkable. Pancreas: No focal abnormality or ductal dilatation. Spleen: No focal abnormality.  Normal size. Adrenals/Urinary Tract: Stable bilateral renal cysts. No hydronephrosis. Adrenal glands and urinary bladder unremarkable. Stomach/Bowel: Left lower quadrant ostomy noted, unchanged. No evidence of bowel obstruction. Stomach and small bowel grossly unremarkable. Vascular/Lymphatic: Aortic atherosclerosis. No evidence of aneurysm or adenopathy. Reproductive: Prior hysterectomy. Other: No free fluid or free air. Musculoskeletal: No acute bony abnormality. IMPRESSION: Left lower quadrant ostomy. No complicating feature or change since prior study. Stable bilateral renal cysts.  No follow-up recommended. Aortic atherosclerosis. No acute findings. Electronically Signed: By: Rolm Baptise M.D. On: 01/20/2022 23:22    Assessment & Plan:   Problem List Items Addressed This Visit     Essential hypertension    BP Readings from Last 3 Encounters:  03/16/22 120/78  09/22/21 122/80  09/08/21 130/68  On Losartan and HCTZ Labs pre-op -- planned       Obesity, Class II, BMI 35-39.9    Wt Readings from Last 3 Encounters:  03/16/22 177 lb 3.2 oz (80.4 kg)  09/22/21 171 lb (77.6 kg)  09/08/21 172 lb 3.2 oz (78.1 kg)        Hyperglycemia    Check A1c       Colostomy in place [facility])    Stoma - planning reversal in June 2023         No orders of the defined types were placed in this encounter.     Follow-up: No follow-ups on file.  Walker Kehr, MD

## 2022-03-16 NOTE — Patient Instructions (Signed)
[name] , Thank you for taking time to come for your Medicare Wellness Visit. I appreciate your ongoing commitment to your health goals. Please review the following plan we discussed and let me know if I can assist you in the future.   Screening recommendations/referrals: Colonoscopy: 08/03/2017; due every 10 years Mammogram: 10/14/2021; due every year Bone Density: 18/28/2020; due every 2 years  Recommended yearly ophthalmology/optometry visit for glaucoma screening and checkup Recommended yearly dental visit for hygiene and checkup  Vaccinations: Influenza vaccine: 08/06/2021 Pneumococcal vaccine: 08/27/2018, 09/07/2020 Tdap vaccine: 08/01/2016; due every 10 years Shingles vaccine: 11/19/2019, 06/20/2020   Covid-19: 12/21/2019, 01/11/2020, 08/06/2020, 08/06/2021  Advanced directives: No; Advance directive discussed with you today. Even though you declined this today please call our office should you change your mind and we can give you the proper paperwork for you to fill out.  Conditions/risks identified: Yes  Next appointment: Please schedule your next Medicare Wellness Visit with your Nurse Health Advisor in 1 year by calling [phone].   Preventive Care 24 Years and Older, Female Preventive care refers to lifestyle choices and visits with your health care provider that can promote health and wellness. What does preventive care include? A yearly physical exam. This is also called an annual well check. Dental exams once or twice a year. Routine eye exams. Ask your health care provider how often you should have your eyes checked. Personal lifestyle choices, including: Daily care of your teeth and gums. Regular physical activity. Eating a healthy diet. Avoiding tobacco and drug use. Limiting alcohol use. Practicing safe sex. Taking low-dose aspirin every day. Taking vitamin and mineral supplements as recommended by your health care provider. What happens during an annual  well check? The services and screenings done by your health care provider during your annual well check will depend on your age, overall health, lifestyle risk factors, and family history of disease. Counseling  Your health care provider may ask you questions about your: Alcohol use. Tobacco use. Drug use. Emotional well-being. Home and relationship well-being. Sexual activity. Eating habits. History of falls. Memory and ability to understand (cognition). Work and work Statistician. Reproductive health. Screening  You may have the following tests or measurements: Height, weight, and BMI. Blood pressure. Lipid and cholesterol levels. These may be checked every 5 years, or more frequently if you are over 84 years old. Skin check. Lung cancer screening. You may have this screening every year starting at age 63 if you have a 30-pack-year history of smoking and currently smoke or have quit within the past 15 years. Fecal occult blood test (FOBT) of the stool. You may have this test every year starting at age 26. Flexible sigmoidoscopy or colonoscopy. You may have a sigmoidoscopy every 5 years or a colonoscopy every 10 years starting at age 61. Hepatitis C blood test. Hepatitis B blood test. Sexually transmitted disease (STD) testing. Diabetes screening. This is done by checking your blood sugar (glucose) after you have not eaten for a while (fasting). You may have this done every 1-3 years. Bone density scan. This is done to screen for osteoporosis. You may have this done starting at age 38. Mammogram. This may be done every 1-2 years. Talk to your health care provider about how often you should have regular mammograms. Talk with your health care provider about your test results, treatment options, and if necessary, the need for more tests. Vaccines  Your health care provider may recommend certain vaccines, such as: Influenza vaccine. This is recommended  every year. Tetanus, diphtheria,  and acellular pertussis (Tdap, Td) vaccine. You may need a Td booster every 10 years. Zoster vaccine. You may need this after age 95. Pneumococcal 13-valent conjugate (PCV13) vaccine. One dose is recommended after age 59. Pneumococcal polysaccharide (PPSV23) vaccine. One dose is recommended after age 62. Talk to your health care provider about which screenings and vaccines you need and how often you need them. This information is not intended to replace advice given to you by your health care provider. Make sure you discuss any questions you have with your health care provider. Document Released: 11/06/2015 Document Revised: 06/29/2016 Document Reviewed: 08/11/2015 Elsevier Interactive Patient Education  2017 [place] Prevention in the Home Falls can cause injuries. They can happen to people of all ages. There are many things you can do to make your home safe and to help prevent falls. What can I do on the outside of my home? Regularly fix the edges of walkways and driveways and fix any cracks. Remove anything that might make you trip as you walk through a door, such as a raised step or threshold. Trim any bushes or trees on the path to your home. Use bright outdoor lighting. Clear any walking paths of anything that might make someone trip, such as rocks or tools. Regularly check to see if handrails are loose or broken. Make sure that both sides of any steps have handrails. Any raised decks and porches should have guardrails on the edges. Have any leaves, snow, or ice cleared regularly. Use sand or salt on walking paths during winter. Clean up any spills in your garage right away. This includes oil or grease spills. What can I do in the bathroom? Use night lights. Install grab bars by the toilet and in the tub and shower. Do not use towel bars as grab bars. Use non-skid mats or decals in the tub or shower. If you need to sit down in the shower, use a plastic, non-slip  stool. Keep the floor dry. Clean up any water that spills on the floor as soon as it happens. Remove soap buildup in the tub or shower regularly. Attach bath mats securely with double-sided non-slip rug tape. Do not have throw rugs and other things on the floor that can make you trip. What can I do in the bedroom? Use night lights. Make sure that you have a light by your bed that is easy to reach. Do not use any sheets or blankets that are too big for your bed. They should not hang down onto the floor. Have a firm chair that has side arms. You can use this for support while you get dressed. Do not have throw rugs and other things on the floor that can make you trip. What can I do in the kitchen? Clean up any spills right away. Avoid walking on wet floors. Keep items that you use a lot in easy-to-reach places. If you need to reach something above you, use a strong step stool that has a grab bar. Keep electrical cords out of the way. Do not use floor polish or wax that makes floors slippery. If you must use wax, use non-skid floor wax. Do not have throw rugs and other things on the floor that can make you trip. What can I do with my stairs? Do not leave any items on the stairs. Make sure that there are handrails on both sides of the stairs and use them. Fix handrails that are broken  or loose. Make sure that handrails are as long as the stairways. Check any carpeting to make sure that it is firmly attached to the stairs. Fix any carpet that is loose or worn. Avoid having throw rugs at the top or bottom of the stairs. If you do have throw rugs, attach them to the floor with carpet tape. Make sure that you have a light switch at the top of the stairs and the bottom of the stairs. If you do not have them, ask someone to add them for you. What else can I do to help prevent falls? Wear shoes that: Do not have high heels. Have rubber bottoms. Are comfortable and fit you well. Are closed at the  toe. Do not wear sandals. If you use a stepladder: Make sure that it is fully opened. Do not climb a closed stepladder. Make sure that both sides of the stepladder are locked into place. Ask someone to hold it for you, if possible. Clearly mark and make sure that you can see: Any grab bars or handrails. First and last steps. Where the edge of each step is. Use tools that help you move around (mobility aids) if they are needed. These include: Canes. Walkers. Scooters. Crutches. Turn on the lights when you go into a dark area. Replace any light bulbs as soon as they burn out. Set up your furniture so you have a clear path. Avoid moving your furniture around. If any of your floors are uneven, fix them. If there are any pets around you, be aware of where they are. Review your medicines with your doctor. Some medicines can make you feel dizzy. This can increase your chance of falling. Ask your doctor what other things that you can do to help prevent falls. This information is not intended to replace advice given to you by your health care provider. Make sure you discuss any questions you have with your health care provider. Document Released: 08/06/2009 Document Revised: 03/17/2016 Document Reviewed: 11/14/2014 Elsevier Interactive Patient Education  2017 [employer].

## 2022-03-16 NOTE — Assessment & Plan Note (Signed)
Wt Readings from Last 3 Encounters:  03/16/22 177 lb 3.2 oz (80.4 kg)  09/22/21 171 lb (77.6 kg)  09/08/21 172 lb 3.2 oz (78.1 kg)

## 2022-03-16 NOTE — Assessment & Plan Note (Signed)
[facility] - planning reversal in June 2023

## 2022-03-18 NOTE — Patient Instructions (Signed)
DUE TO COVID-19 ONLY TWO VISITORS  (aged 70 and older)  ARE ALLOWED TO COME WITH YOU AND STAY IN THE WAITING ROOM ONLY DURING PRE OP AND PROCEDURE.   **NO VISITORS ARE ALLOWED IN THE SHORT STAY AREA OR RECOVERY ROOM!!**  IF YOU WILL BE ADMITTED INTO THE HOSPITAL YOU ARE ALLOWED ONLY FOUR SUPPORT PEOPLE DURING VISITATION HOURS ONLY (7 AM -8PM)   The support person(s) must pass our screening, gel in and out, and wear a mask at all times, including in the patient's room. Patients must also wear a mask when staff or their support person are in the room. Visitors GUEST BADGE MUST BE WORN VISIBLY  One adult visitor may remain with you overnight and MUST be in the room by 8 P.M.     Your procedure is scheduled on: 04/01/22   Report to [facility] Main Entrance    Report to admitting at  7:45 AM   Call this number if you have problems the morning of surgery [phone]   Do not eat food :After Midnight. Follow diet restrictions by the Dr's office   After Midnight you may have the following liquids until _7:00 AM/  DAY OF SURGERY  Water Black Coffee (sugar ok, NO MILK/CREAM OR CREAMERS)  Tea (sugar ok, NO MILK/CREAM OR CREAMERS) regular and decaf                             Plain Jell-O (NO RED)                                           Fruit ices (not with fruit pulp, NO RED)                                     Popsicles (NO RED)                                                                  Juice: apple, WHITE grape, WHITE cranberry Sports drinks like Gatorade (NO RED) Clear broth(vegetable,chicken,beef)               Drink 2 Ensure/G2 drinks AT 10:00 PM the night before surgery.        The day of surgery:  Drink ONE (1) Pre-Surgery Clear Ensure at   6:45 AM the morning of surgery. Drink in one sitting. Do not sip.  This drink was given to you during your hospital  pre-op appointment visit. Nothing else to drink after completing the  Pre-Surgery Clear Ensure at 7:00            If you have questions, please contact your surgeon's office.   FOLLOW BOWEL PREP AND ANY ADDITIONAL PRE OP INSTRUCTIONS YOU RECEIVED FROM YOUR SURGEON'S OFFICE!!!   Drink plenty of fluids the day of prep to prevent dehydration   Oral Hygiene is also important to reduce your risk of infection.  Remember - BRUSH YOUR TEETH THE MORNING OF SURGERY WITH YOUR REGULAR TOOTHPASTE   Do NOT smoke after Midnight   Take these medicines the morning of surgery with A SIP OF WATER: Pantoprazole                                You may not have any metal on your body including hair pins, jewelry, and body piercing             Do not wear make-up, lotions, powders, perfumes/cologne, or deodorant  Do not wear nail polish including gel and S&S, artificial/acrylic nails, or any other type of covering on natural nails including finger and toenails. If you have artificial nails, gel coating, etc. that needs to be removed by a nail salon please have this removed prior to surgery or surgery may need to be canceled/ delayed if the surgeon/ anesthesia feels like they are unable to be safely monitored.   Do not shave  48 hours prior to surgery.     Do not bring valuables to the hospital. [place].   Contacts, dentures or bridgework may not be worn into surgery.   Bring small overnight bag day of surgery.     Special Instructions: Bring a copy of your healthcare power of attorney and living will documents  the day of surgery if you haven't scanned them before.              Please read over the following fact sheets you were given: IF YOU HAVE QUESTIONS ABOUT YOUR PRE-OP INSTRUCTIONS PLEASE CALL [phone]     [facility] - Preparing for Surgery Before surgery, you can play an important role.  Because skin is not sterile, your skin needs to be as free of germs as possible.  You can reduce the number of germs on your  skin by washing with CHG (chlorahexidine gluconate) soap before surgery.  CHG is an antiseptic cleaner which kills germs and bonds with the skin to continue killing germs even after washing. Please DO NOT use if you have an allergy to CHG or antibacterial soaps.  If your skin becomes reddened/irritated stop using the CHG and inform your nurse when you arrive at Short Stay. Do not shave (including legs and underarms) for at least 48 hours prior to the first CHG shower.   Please follow these instructions carefully:  1.  Shower with CHG Soap the night before surgery and the  morning of Surgery.  2.  If you choose to wash your hair, wash your hair first as usual with your  normal  shampoo.  3.  After you shampoo, rinse your hair and body thoroughly to remove the  shampoo.                            4.  Use CHG as you would any other liquid soap.  You can apply chg directly  to the skin and wash                       Gently with a scrungie or clean washcloth.  5.  Apply the CHG Soap to your body ONLY FROM THE NECK DOWN.   Do not use on face/ open  Wound or open sores. Avoid contact with eyes, ears mouth and genitals (private parts).                       Wash face,  Genitals (private parts) with your normal soap.             6.  Wash thoroughly, paying special attention to the area where your surgery  will be performed.  7.  Thoroughly rinse your body with warm water from the neck down.  8.  DO NOT shower/wash with your normal soap after using and rinsing off  the CHG Soap.                9.  Pat yourself dry with a clean towel.            10.  Wear clean pajamas.            11.  Place clean sheets on your bed the night of your first shower and do not  sleep with pets. Day of Surgery : Do not apply any lotions/deodorants the morning of surgery.  Please wear clean clothes to the hospital/surgery center.  FAILURE TO FOLLOW THESE INSTRUCTIONS MAY RESULT IN THE CANCELLATION OF  YOUR SURGERY ______________________________________________________________________________________________________   Incentive Spirometer  An incentive spirometer is a tool that can help keep your lungs clear and active. This tool measures how well you are filling your lungs with each breath. Taking long deep breaths may help reverse or decrease the chance of developing breathing (pulmonary) problems (especially infection) following: A long period of time when you are unable to move or be active. BEFORE THE PROCEDURE  If the spirometer includes an indicator to show your best effort, your nurse or respiratory therapist will set it to a desired goal. If possible, sit up straight or lean slightly forward. Try not to slouch. Hold the incentive spirometer in an upright position. INSTRUCTIONS FOR USE  Sit on the edge of your bed if possible, or sit up as far as you can in bed or on a chair. Hold the incentive spirometer in an upright position. Breathe out normally. Place the mouthpiece in your mouth and seal your lips tightly around it. Breathe in slowly and as deeply as possible, raising the piston or the ball toward the top of the column. Hold your breath for 3-5 seconds or for as long as possible. Allow the piston or ball to fall to the bottom of the column. Remove the mouthpiece from your mouth and breathe out normally. Rest for a few seconds and repeat Steps 1 through 7 at least 10 times every 1-2 hours when you are awake. Take your time and take a few normal breaths between deep breaths. The spirometer may include an indicator to show your best effort. Use the indicator as a goal to work toward during each repetition. After each set of 10 deep breaths, practice coughing to be sure your lungs are clear. If you have an incision (the cut made at the time of surgery), support your incision when coughing by placing a pillow or rolled up towels firmly against it. Once you are able to get out of bed,  walk around indoors and cough well. You may stop using the incentive spirometer when instructed by your caregiver.  RISKS AND COMPLICATIONS Take your time so you do not get dizzy or light-headed. If you are in pain, you may need to take or ask for pain medication before doing  incentive spirometry. It is harder to take a deep breath if you are having pain. AFTER USE Rest and breathe slowly and easily. It can be helpful to keep track of a log of your progress. Your caregiver can provide you with a simple table to help with this. If you are using the spirometer at home, follow these instructions: [place] IF:  You are having difficultly using the spirometer. You have trouble using the spirometer as often as instructed. Your pain medication is not giving enough relief while using the spirometer. You develop fever of 100.5 F (38.1 C) or higher. SEEK IMMEDIATE MEDICAL CARE IF:  You cough up bloody sputum that had not been present before. You develop fever of 102 F (38.9 C) or greater. You develop worsening pain at or near the incision site. MAKE SURE YOU:  Understand these instructions. Will watch your condition. Will get help right away if you are not doing well or get worse. Document Released: 02/20/2007 Document Revised: 01/02/2012 Document Reviewed: 04/23/2007 [name] Patient Information 2014 [place], [place].   ________________________________________________________________________

## 2022-03-22 ENCOUNTER — Other Ambulatory Visit: Payer: Self-pay

## 2022-03-22 ENCOUNTER — Encounter (HOSPITAL_COMMUNITY)
Admission: RE | Admit: 2022-03-22 | Discharge: 2022-03-22 | Disposition: A | Discharge status: Home / Self Care | Payer: Medicare Other | Source: Ambulatory Visit | Attending: Surgery | Admitting: Surgery

## 2022-03-22 ENCOUNTER — Other Ambulatory Visit (HOSPITAL_COMMUNITY): Payer: Self-pay

## 2022-03-22 ENCOUNTER — Encounter (HOSPITAL_COMMUNITY): Payer: Self-pay

## 2022-03-22 VITALS — BP 136/89 | HR 86 | Temp 98.6°F | Resp 18 | Ht 58.25 in | Wt 176.0 lb

## 2022-03-22 DIAGNOSIS — Z01818 Encounter for other preprocedural examination: Secondary | ICD-10-CM | POA: Diagnosis not present

## 2022-03-22 DIAGNOSIS — I10 Essential (primary) hypertension: Secondary | ICD-10-CM | POA: Diagnosis not present

## 2022-03-22 DIAGNOSIS — T8131XD Disruption of external operation (surgical) wound, not elsewhere classified, subsequent encounter: Secondary | ICD-10-CM | POA: Diagnosis not present

## 2022-03-22 DIAGNOSIS — Z933 Colostomy status: Secondary | ICD-10-CM | POA: Diagnosis not present

## 2022-03-22 DIAGNOSIS — K5732 Diverticulitis of large intestine without perforation or abscess without bleeding: Secondary | ICD-10-CM | POA: Diagnosis not present

## 2022-03-22 DIAGNOSIS — S31109A Unspecified open wound of abdominal wall, unspecified quadrant without penetration into peritoneal cavity, initial encounter: Secondary | ICD-10-CM | POA: Diagnosis not present

## 2022-03-22 LAB — BASIC METABOLIC PANEL
Anion gap: 8 (ref 5–15)
BUN: 22 mg/dL (ref 8–23)
CO2: 26 mmol/L (ref 22–32)
Calcium: 10 mg/dL (ref 8.9–10.3)
Chloride: 106 mmol/L (ref 98–111)
Creatinine, Ser: 1.17 mg/dL — ABNORMAL HIGH (ref 0.44–1.00)
GFR, Estimated: 50 mL/min — ABNORMAL LOW (ref >60)
Glucose, Bld: 107 mg/dL — ABNORMAL HIGH (ref 70–99)
Potassium: 4.3 mmol/L (ref 3.5–5.1)
Sodium: 140 mmol/L (ref 135–145)

## 2022-03-22 LAB — CBC
HCT: 32.8 % — ABNORMAL LOW (ref 36.0–46.0)
Hemoglobin: 10.5 g/dL — ABNORMAL LOW (ref 12.0–15.0)
MCH: 25.4 pg — ABNORMAL LOW (ref 26.0–34.0)
MCHC: 32 g/dL (ref 30.0–36.0)
MCV: 79.4 fL — ABNORMAL LOW (ref 80.0–100.0)
Platelets: 450 10*3/uL — ABNORMAL HIGH (ref 150–400)
RBC: 4.13 MIL/uL (ref 3.87–5.11)
RDW: 17.5 % — ABNORMAL HIGH (ref 11.5–15.5)
WBC: 11.3 10*3/uL — ABNORMAL HIGH (ref 4.0–10.5)
nRBC: 0 % (ref 0.0–0.2)

## 2022-03-22 NOTE — Progress Notes (Signed)
Anesthesia note:  Bowel prep reminder:Yes  PCP - Dr. Loni Muse Plotnikov Cardiologist -none Other-   Chest x-ray - no EKG - 03/22/22-chart Stress Test - no ECHO - no Cardiac Cath - NA  Pacemaker/ICD device last checked:NA  Sleep Study - no CPAP -   Pt is pre diabetic-NA Fasting Blood Sugar -  Checks Blood Sugar _____  Blood Thinner:NA Blood Thinner Instructions: Aspirin Instructions: Last Dose:  Anesthesia review: no  Patient denies shortness of breath, fever, cough and chest pain at PAT appointment Pt has no SOB with any activities.   Patient verbalized understanding of instructions that were given to them at the PAT appointment. Patient was also instructed that they will need to review over the PAT instructions again at home before surgery. yes

## 2022-04-01 ENCOUNTER — Encounter (HOSPITAL_COMMUNITY): Payer: Self-pay | Admitting: Surgery

## 2022-04-01 ENCOUNTER — Inpatient Hospital Stay (HOSPITAL_COMMUNITY): Payer: Medicare Other | Admitting: Anesthesiology

## 2022-04-01 ENCOUNTER — Inpatient Hospital Stay (HOSPITAL_COMMUNITY)
Admission: RE | Admit: 2022-04-01 | Discharge: 2022-04-18 | DRG: 330 | Disposition: A | Discharge status: Home Health | Payer: Medicare Other | Source: Ambulatory Visit | Attending: Surgery | Admitting: Surgery

## 2022-04-01 ENCOUNTER — Other Ambulatory Visit: Payer: Self-pay

## 2022-04-01 ENCOUNTER — Encounter (HOSPITAL_COMMUNITY)
Admission: RE | Disposition: A | Discharge status: Home Health | Payer: Self-pay | Source: Ambulatory Visit | Attending: Surgery

## 2022-04-01 DIAGNOSIS — Z6836 Body mass index (BMI) 36.0-36.9, adult: Secondary | ICD-10-CM | POA: Diagnosis not present

## 2022-04-01 DIAGNOSIS — Z433 Encounter for attention to colostomy: Secondary | ICD-10-CM

## 2022-04-01 DIAGNOSIS — Z85828 Personal history of other malignant neoplasm of skin: Secondary | ICD-10-CM | POA: Diagnosis not present

## 2022-04-01 DIAGNOSIS — I7300 Raynaud's syndrome without gangrene: Secondary | ICD-10-CM | POA: Diagnosis not present

## 2022-04-01 DIAGNOSIS — E669 Obesity, unspecified: Secondary | ICD-10-CM | POA: Diagnosis present

## 2022-04-01 DIAGNOSIS — K219 Gastro-esophageal reflux disease without esophagitis: Secondary | ICD-10-CM | POA: Diagnosis present

## 2022-04-01 DIAGNOSIS — Z8249 Family history of ischemic heart disease and other diseases of the circulatory system: Secondary | ICD-10-CM | POA: Diagnosis not present

## 2022-04-01 DIAGNOSIS — Z9049 Acquired absence of other specified parts of digestive tract: Secondary | ICD-10-CM

## 2022-04-01 DIAGNOSIS — K567 Ileus, unspecified: Secondary | ICD-10-CM | POA: Diagnosis not present

## 2022-04-01 DIAGNOSIS — D509 Iron deficiency anemia, unspecified: Secondary | ICD-10-CM | POA: Diagnosis present

## 2022-04-01 DIAGNOSIS — T8143XA Infection following a procedure, organ and space surgical site, initial encounter: Secondary | ICD-10-CM | POA: Diagnosis not present

## 2022-04-01 DIAGNOSIS — J309 Allergic rhinitis, unspecified: Secondary | ICD-10-CM | POA: Diagnosis not present

## 2022-04-01 DIAGNOSIS — T797XXA Traumatic subcutaneous emphysema, initial encounter: Secondary | ICD-10-CM | POA: Diagnosis not present

## 2022-04-01 DIAGNOSIS — R109 Unspecified abdominal pain: Secondary | ICD-10-CM | POA: Diagnosis not present

## 2022-04-01 DIAGNOSIS — T8143XD Infection following a procedure, organ and space surgical site, subsequent encounter: Secondary | ICD-10-CM | POA: Diagnosis not present

## 2022-04-01 DIAGNOSIS — Z79899 Other long term (current) drug therapy: Secondary | ICD-10-CM

## 2022-04-01 DIAGNOSIS — E876 Hypokalemia: Secondary | ICD-10-CM | POA: Diagnosis not present

## 2022-04-01 DIAGNOSIS — B952 Enterococcus as the cause of diseases classified elsewhere: Secondary | ICD-10-CM | POA: Diagnosis present

## 2022-04-01 DIAGNOSIS — Z888 Allergy status to other drugs, medicaments and biological substances status: Secondary | ICD-10-CM | POA: Diagnosis not present

## 2022-04-01 DIAGNOSIS — N182 Chronic kidney disease, stage 2 (mild): Secondary | ICD-10-CM | POA: Diagnosis not present

## 2022-04-01 DIAGNOSIS — I129 Hypertensive chronic kidney disease with stage 1 through stage 4 chronic kidney disease, or unspecified chronic kidney disease: Secondary | ICD-10-CM | POA: Diagnosis present

## 2022-04-01 DIAGNOSIS — Z8719 Personal history of other diseases of the digestive system: Secondary | ICD-10-CM | POA: Diagnosis not present

## 2022-04-01 DIAGNOSIS — E785 Hyperlipidemia, unspecified: Secondary | ICD-10-CM | POA: Diagnosis not present

## 2022-04-01 DIAGNOSIS — K632 Fistula of intestine: Principal | ICD-10-CM | POA: Diagnosis present

## 2022-04-01 DIAGNOSIS — K660 Peritoneal adhesions (postprocedural) (postinfection): Secondary | ICD-10-CM | POA: Diagnosis not present

## 2022-04-01 DIAGNOSIS — K5939 Other megacolon: Secondary | ICD-10-CM | POA: Diagnosis not present

## 2022-04-01 DIAGNOSIS — I700 Atherosclerosis of aorta: Secondary | ICD-10-CM | POA: Diagnosis not present

## 2022-04-01 DIAGNOSIS — K9189 Other postprocedural complications and disorders of digestive system: Secondary | ICD-10-CM

## 2022-04-01 DIAGNOSIS — K633 Ulcer of intestine: Secondary | ICD-10-CM | POA: Diagnosis not present

## 2022-04-01 DIAGNOSIS — I10 Essential (primary) hypertension: Secondary | ICD-10-CM | POA: Diagnosis not present

## 2022-04-01 DIAGNOSIS — Z978 Presence of other specified devices: Secondary | ICD-10-CM | POA: Diagnosis not present

## 2022-04-01 DIAGNOSIS — R188 Other ascites: Principal | ICD-10-CM

## 2022-04-01 DIAGNOSIS — E66812 Obesity, class 2: Secondary | ICD-10-CM | POA: Diagnosis present

## 2022-04-01 DIAGNOSIS — Z4682 Encounter for fitting and adjustment of non-vascular catheter: Secondary | ICD-10-CM | POA: Diagnosis not present

## 2022-04-01 DIAGNOSIS — K6389 Other specified diseases of intestine: Secondary | ICD-10-CM | POA: Diagnosis not present

## 2022-04-01 HISTORY — PX: LYSIS OF ADHESION: SHX5961

## 2022-04-01 HISTORY — PX: XI ROBOTIC ASSISTED COLOSTOMY TAKEDOWN: SHX6828

## 2022-04-01 HISTORY — PX: SMALL BOWEL REPAIR: SHX6447

## 2022-04-01 HISTORY — PX: PROCTOSCOPY: SHX2266

## 2022-04-01 LAB — TYPE AND SCREEN
ABO/RH(D): O POS
Antibody Screen: NEGATIVE

## 2022-04-01 SURGERY — CLOSURE, COLOSTOMY, ROBOT-ASSISTED
Anesthesia: General

## 2022-04-01 MED ORDER — DIPHENHYDRAMINE HCL 50 MG/ML IJ SOLN: 12.5000 mg | Freq: Four times a day (QID) | INTRAMUSCULAR | Status: DC | PRN

## 2022-04-01 MED ORDER — HYDRALAZINE HCL 20 MG/ML IJ SOLN
INTRAMUSCULAR | Status: DC | PRN
  Administered 2022-04-01: 2.5 mg via INTRAVENOUS

## 2022-04-01 MED ORDER — GABAPENTIN 300 MG PO CAPS: 300.0000 mg | ORAL_CAPSULE | ORAL | Status: AC

## 2022-04-01 MED ORDER — ONDANSETRON HCL 4 MG/2ML IJ SOLN
INTRAMUSCULAR | Status: DC | PRN
  Administered 2022-04-01: 4 mg via INTRAVENOUS

## 2022-04-01 MED ORDER — FENTANYL CITRATE PF 50 MCG/ML IJ SOSY: 25.0000 ug | PREFILLED_SYRINGE | INTRAMUSCULAR | Status: DC | PRN

## 2022-04-01 MED ORDER — LACTATED RINGERS IV BOLUS: 1000.0000 mL | Freq: Three times a day (TID) | INTRAVENOUS | Status: AC | PRN

## 2022-04-01 MED ORDER — FENTANYL CITRATE (PF) 250 MCG/5ML IJ SOLN
INTRAMUSCULAR | Status: AC
  Filled 2022-04-01: qty 5

## 2022-04-01 MED ORDER — SUGAMMADEX SODIUM 200 MG/2ML IV SOLN
INTRAVENOUS | Status: DC | PRN
  Administered 2022-04-01: 200 mg via INTRAVENOUS

## 2022-04-01 MED ORDER — HYDROMORPHONE HCL 1 MG/ML IJ SOLN
0.5000 mg | INTRAMUSCULAR | Status: DC | PRN
  Administered 2022-04-01 – 2022-04-03 (×7): 1 mg via INTRAVENOUS
  Filled 2022-04-01: qty 1
  Filled 2022-04-01: qty 2
  Filled 2022-04-01 (×5): qty 1

## 2022-04-01 MED ORDER — PROPOFOL 10 MG/ML IV BOLUS
INTRAVENOUS | Status: DC | PRN
  Administered 2022-04-01: 120 mg via INTRAVENOUS

## 2022-04-01 MED ORDER — LABETALOL HCL 5 MG/ML IV SOLN
INTRAVENOUS | Status: AC
  Filled 2022-04-01: qty 4

## 2022-04-01 MED ORDER — VITAMIN D 25 MCG (1000 UNIT) PO TABS
1000.0000 [IU] | ORAL_TABLET | Freq: Every day | ORAL | Status: DC
  Administered 2022-04-01 – 2022-04-03 (×3): 1000 [IU] via ORAL
  Filled 2022-04-01 (×3): qty 1

## 2022-04-01 MED ORDER — MONTELUKAST SODIUM 10 MG PO TABS
10.0000 mg | ORAL_TABLET | Freq: Every day | ORAL | Status: DC
  Administered 2022-04-01 – 2022-04-03 (×3): 10 mg via ORAL
  Filled 2022-04-01 (×3): qty 1

## 2022-04-01 MED ORDER — ENOXAPARIN SODIUM 40 MG/0.4ML IJ SOSY
PREFILLED_SYRINGE | INTRAMUSCULAR | Status: AC
  Administered 2022-04-01: 40 mg via SUBCUTANEOUS
  Filled 2022-04-01: qty 0.4

## 2022-04-01 MED ORDER — ALUM & MAG HYDROXIDE-SIMETH 200-200-20 MG/5ML PO SUSP
30.0000 mL | Freq: Four times a day (QID) | ORAL | Status: DC | PRN
  Filled 2022-04-01: qty 30

## 2022-04-01 MED ORDER — ALBUMIN HUMAN 5 % IV SOLN
INTRAVENOUS | Status: AC
  Filled 2022-04-01: qty 250

## 2022-04-01 MED ORDER — ACETAMINOPHEN 500 MG PO TABS
ORAL_TABLET | ORAL | Status: AC
  Administered 2022-04-01: 1000 mg via ORAL
  Filled 2022-04-01: qty 2

## 2022-04-01 MED ORDER — ALVIMOPAN 12 MG PO CAPS: 12.0000 mg | ORAL_CAPSULE | ORAL | Status: AC

## 2022-04-01 MED ORDER — EPHEDRINE SULFATE (PRESSORS) 50 MG/ML IJ SOLN
INTRAMUSCULAR | Status: DC | PRN
  Administered 2022-04-01: 5 mg via INTRAVENOUS

## 2022-04-01 MED ORDER — LACTATED RINGERS IV SOLN: INTRAVENOUS | Status: DC

## 2022-04-01 MED ORDER — MELATONIN 3 MG PO TABS
3.0000 mg | ORAL_TABLET | Freq: Every evening | ORAL | Status: DC | PRN
  Administered 2022-04-03: 3 mg via ORAL
  Filled 2022-04-01: qty 1

## 2022-04-01 MED ORDER — LACTATED RINGERS IV SOLN
INTRAVENOUS | Status: DC
  Administered 2022-04-01: 1000 mL via INTRAVENOUS

## 2022-04-01 MED ORDER — ROCURONIUM BROMIDE 10 MG/ML (PF) SYRINGE
PREFILLED_SYRINGE | INTRAVENOUS | Status: AC
  Filled 2022-04-01: qty 10

## 2022-04-01 MED ORDER — HYDRALAZINE HCL 20 MG/ML IJ SOLN
INTRAMUSCULAR | Status: AC
  Filled 2022-04-01: qty 1

## 2022-04-01 MED ORDER — SODIUM CHLORIDE 0.9 % IV SOLN
INTRAVENOUS | Status: AC
  Filled 2022-04-01: qty 2

## 2022-04-01 MED ORDER — SODIUM CHLORIDE 0.9% FLUSH
3.0000 mL | Freq: Two times a day (BID) | INTRAVENOUS | Status: DC
  Administered 2022-04-02 – 2022-04-18 (×12): 3 mL via INTRAVENOUS

## 2022-04-01 MED ORDER — FENTANYL CITRATE (PF) 100 MCG/2ML IJ SOLN
INTRAMUSCULAR | Status: AC
  Filled 2022-04-01: qty 2

## 2022-04-01 MED ORDER — MAGIC MOUTHWASH: 15.0000 mL | Freq: Four times a day (QID) | ORAL | Status: DC | PRN

## 2022-04-01 MED ORDER — ACETAMINOPHEN 500 MG PO TABS: 1000.0000 mg | ORAL_TABLET | ORAL | Status: AC

## 2022-04-01 MED ORDER — METOPROLOL TARTRATE 5 MG/5ML IV SOLN
5.0000 mg | Freq: Four times a day (QID) | INTRAVENOUS | Status: DC | PRN
  Administered 2022-04-12: 5 mg via INTRAVENOUS
  Filled 2022-04-01: qty 5

## 2022-04-01 MED ORDER — BISACODYL 5 MG PO TBEC: 20.0000 mg | DELAYED_RELEASE_TABLET | Freq: Once | ORAL | Status: DC

## 2022-04-01 MED ORDER — LIDOCAINE HCL (PF) 2 % IJ SOLN
INTRAMUSCULAR | Status: AC
  Filled 2022-04-01: qty 5

## 2022-04-01 MED ORDER — BUPIVACAINE-EPINEPHRINE (PF) 0.25% -1:200000 IJ SOLN
INTRAMUSCULAR | Status: DC | PRN
  Administered 2022-04-01: 50 mL

## 2022-04-01 MED ORDER — ONDANSETRON HCL 4 MG/2ML IJ SOLN
4.0000 mg | Freq: Four times a day (QID) | INTRAMUSCULAR | Status: DC | PRN
  Administered 2022-04-03: 4 mg via INTRAVENOUS
  Filled 2022-04-01 (×2): qty 2

## 2022-04-01 MED ORDER — ADULT MULTIVITAMIN W/MINERALS CH
1.0000 | ORAL_TABLET | Freq: Every day | ORAL | Status: DC
  Administered 2022-04-01 – 2022-04-03 (×3): 1 via ORAL
  Filled 2022-04-01 (×3): qty 1

## 2022-04-01 MED ORDER — DIPHENHYDRAMINE HCL 12.5 MG/5ML PO ELIX: 12.5000 mg | ORAL_SOLUTION | Freq: Four times a day (QID) | ORAL | Status: DC | PRN

## 2022-04-01 MED ORDER — GABAPENTIN 300 MG PO CAPS
ORAL_CAPSULE | ORAL | Status: AC
  Administered 2022-04-01: 300 mg via ORAL
  Filled 2022-04-01: qty 1

## 2022-04-01 MED ORDER — GABAPENTIN 300 MG PO CAPS
300.0000 mg | ORAL_CAPSULE | Freq: Every day | ORAL | Status: DC
  Administered 2022-04-01: 300 mg via ORAL
  Filled 2022-04-01: qty 1

## 2022-04-01 MED ORDER — PHENYLEPHRINE HCL (PRESSORS) 10 MG/ML IV SOLN
INTRAVENOUS | Status: AC
  Filled 2022-04-01: qty 1

## 2022-04-01 MED ORDER — LIDOCAINE HCL (CARDIAC) PF 100 MG/5ML IV SOSY
PREFILLED_SYRINGE | INTRAVENOUS | Status: DC | PRN
  Administered 2022-04-01: 60 mg via INTRAVENOUS

## 2022-04-01 MED ORDER — VITAMIN B-12 1000 MCG PO TABS
1000.0000 ug | ORAL_TABLET | Freq: Every day | ORAL | Status: DC
  Administered 2022-04-01 – 2022-04-03 (×3): 1000 ug via ORAL
  Filled 2022-04-01 (×3): qty 1

## 2022-04-01 MED ORDER — LIDOCAINE HCL (PF) 2 % IJ SOLN
INTRAMUSCULAR | Status: DC | PRN
  Administered 2022-04-01: 1.5 mg/kg/h via INTRADERMAL

## 2022-04-01 MED ORDER — PROCHLORPERAZINE MALEATE 10 MG PO TABS: 10.0000 mg | ORAL_TABLET | Freq: Four times a day (QID) | ORAL | Status: DC | PRN

## 2022-04-01 MED ORDER — PHENYLEPHRINE 80 MCG/ML (10ML) SYRINGE FOR IV PUSH (FOR BLOOD PRESSURE SUPPORT)
PREFILLED_SYRINGE | INTRAVENOUS | Status: AC
  Filled 2022-04-01: qty 10

## 2022-04-01 MED ORDER — ROCURONIUM BROMIDE 100 MG/10ML IV SOLN
INTRAVENOUS | Status: DC | PRN
  Administered 2022-04-01 (×3): 20 mg via INTRAVENOUS
  Administered 2022-04-01: 30 mg via INTRAVENOUS
  Administered 2022-04-01: 60 mg via INTRAVENOUS

## 2022-04-01 MED ORDER — ORAL CARE MOUTH RINSE: 15.0000 mL | Freq: Once | OROMUCOSAL | Status: AC

## 2022-04-01 MED ORDER — ENSURE PRE-SURGERY PO LIQD
296.0000 mL | Freq: Once | ORAL | Status: DC
  Filled 2022-04-01: qty 296

## 2022-04-01 MED ORDER — ALVIMOPAN 12 MG PO CAPS
12.0000 mg | ORAL_CAPSULE | Freq: Two times a day (BID) | ORAL | Status: DC
  Administered 2022-04-02 – 2022-04-03 (×4): 12 mg via ORAL
  Filled 2022-04-01 (×4): qty 1

## 2022-04-01 MED ORDER — DEXAMETHASONE SODIUM PHOSPHATE 10 MG/ML IJ SOLN
INTRAMUSCULAR | Status: DC | PRN
  Administered 2022-04-01: 8 mg via INTRAVENOUS

## 2022-04-01 MED ORDER — FENTANYL CITRATE (PF) 100 MCG/2ML IJ SOLN
INTRAMUSCULAR | Status: DC | PRN
Start: 2022-04-01 — End: 2022-04-01
  Administered 2022-04-01 (×2): 50 ug via INTRAVENOUS
  Administered 2022-04-01 (×2): 25 ug via INTRAVENOUS
  Administered 2022-04-01 (×3): 50 ug via INTRAVENOUS
  Administered 2022-04-01 (×2): 25 ug via INTRAVENOUS

## 2022-04-01 MED ORDER — BUPIVACAINE LIPOSOME 1.3 % IJ SUSP
20.0000 mL | Freq: Once | INTRAMUSCULAR | Status: AC
  Administered 2022-04-01: 20 mL

## 2022-04-01 MED ORDER — ATORVASTATIN CALCIUM 10 MG PO TABS
10.0000 mg | ORAL_TABLET | Freq: Every day | ORAL | Status: DC
  Administered 2022-04-01 – 2022-04-03 (×3): 10 mg via ORAL
  Filled 2022-04-01 (×3): qty 1

## 2022-04-01 MED ORDER — TRIAMCINOLONE ACETONIDE 0.5 % EX OINT: 1.0000 "application " | TOPICAL_OINTMENT | Freq: Once | CUTANEOUS | Status: DC | PRN

## 2022-04-01 MED ORDER — DEXMEDETOMIDINE (PRECEDEX) IN NS 20 MCG/5ML (4 MCG/ML) IV SYRINGE
PREFILLED_SYRINGE | INTRAVENOUS | Status: AC
  Filled 2022-04-01: qty 5

## 2022-04-01 MED ORDER — ENSURE PRE-SURGERY PO LIQD
592.0000 mL | Freq: Once | ORAL | Status: DC
Start: 2022-04-01 — End: 2022-04-01
  Filled 2022-04-01: qty 592

## 2022-04-01 MED ORDER — LIP MEDEX EX OINT
TOPICAL_OINTMENT | Freq: Two times a day (BID) | CUTANEOUS | Status: DC
  Administered 2022-04-02: 75 via TOPICAL
  Administered 2022-04-03 – 2022-04-08 (×7): 1 via TOPICAL
  Administered 2022-04-08 – 2022-04-12 (×4): 75 via TOPICAL
  Administered 2022-04-14: 1 via TOPICAL
  Administered 2022-04-17: 75 via TOPICAL
  Filled 2022-04-01 (×3): qty 7

## 2022-04-01 MED ORDER — PROCHLORPERAZINE EDISYLATE 10 MG/2ML IJ SOLN
5.0000 mg | Freq: Four times a day (QID) | INTRAMUSCULAR | Status: DC | PRN
  Administered 2022-04-01 – 2022-04-11 (×6): 10 mg via INTRAVENOUS
  Filled 2022-04-01 (×6): qty 2

## 2022-04-01 MED ORDER — MIDAZOLAM HCL 2 MG/2ML IJ SOLN
INTRAMUSCULAR | Status: AC
  Filled 2022-04-01: qty 2

## 2022-04-01 MED ORDER — PROPOFOL 10 MG/ML IV BOLUS
INTRAVENOUS | Status: AC
  Filled 2022-04-01: qty 20

## 2022-04-01 MED ORDER — DEXAMETHASONE SODIUM PHOSPHATE 10 MG/ML IJ SOLN
INTRAMUSCULAR | Status: AC
  Filled 2022-04-01: qty 1

## 2022-04-01 MED ORDER — INDOCYANINE GREEN 25 MG IV SOLR
INTRAVENOUS | Status: DC | PRN
  Administered 2022-04-01: 18.75 mg

## 2022-04-01 MED ORDER — STERILE WATER FOR INJECTION IJ SOLN
INTRAMUSCULAR | Status: DC | PRN
  Administered 2022-04-01: 15 mL

## 2022-04-01 MED ORDER — LOSARTAN POTASSIUM 50 MG PO TABS
50.0000 mg | ORAL_TABLET | Freq: Every day | ORAL | Status: DC
  Administered 2022-04-01 – 2022-04-03 (×3): 50 mg via ORAL
  Filled 2022-04-01 (×3): qty 1

## 2022-04-01 MED ORDER — TRAMADOL HCL 50 MG PO TABS
50.0000 mg | ORAL_TABLET | Freq: Four times a day (QID) | ORAL | Status: DC | PRN
  Administered 2022-04-02 (×2): 50 mg via ORAL
  Administered 2022-04-03 – 2022-04-04 (×3): 100 mg via ORAL
  Filled 2022-04-01: qty 2
  Filled 2022-04-01: qty 1
  Filled 2022-04-01: qty 2
  Filled 2022-04-01: qty 1
  Filled 2022-04-01: qty 2

## 2022-04-01 MED ORDER — CHLORHEXIDINE GLUCONATE CLOTH 2 % EX PADS
6.0000 | MEDICATED_PAD | Freq: Once | CUTANEOUS | Status: DC
Start: 2022-04-01 — End: 2022-04-01

## 2022-04-01 MED ORDER — LACTATED RINGERS IR SOLN
Status: DC | PRN
  Administered 2022-04-01: 2000 mL

## 2022-04-01 MED ORDER — B COMPLEX-C PO TABS
1.0000 | ORAL_TABLET | Freq: Every day | ORAL | Status: DC
  Administered 2022-04-01 – 2022-04-03 (×3): 1 via ORAL
  Filled 2022-04-01 (×4): qty 1

## 2022-04-01 MED ORDER — BUPIVACAINE-EPINEPHRINE 0.25% -1:200000 IJ SOLN
INTRAMUSCULAR | Status: AC
  Filled 2022-04-01: qty 1

## 2022-04-01 MED ORDER — HYDRALAZINE HCL 20 MG/ML IJ SOLN: 10.0000 mg | INTRAMUSCULAR | Status: DC | PRN

## 2022-04-01 MED ORDER — LIDOCAINE HCL 2 % IJ SOLN
INTRAMUSCULAR | Status: AC
  Filled 2022-04-01: qty 20

## 2022-04-01 MED ORDER — ONDANSETRON HCL 4 MG PO TABS: 4.0000 mg | ORAL_TABLET | Freq: Four times a day (QID) | ORAL | Status: DC | PRN

## 2022-04-01 MED ORDER — 0.9 % SODIUM CHLORIDE (POUR BTL) OPTIME
TOPICAL | Status: DC | PRN
  Administered 2022-04-01: 2000 mL

## 2022-04-01 MED ORDER — ENOXAPARIN SODIUM 40 MG/0.4ML IJ SOSY: 40.0000 mg | PREFILLED_SYRINGE | Freq: Once | INTRAMUSCULAR | Status: AC

## 2022-04-01 MED ORDER — SODIUM CHLORIDE 0.9 % IV SOLN
250.0000 mL | INTRAVENOUS | Status: DC | PRN
  Administered 2022-04-17: 250 mL via INTRAVENOUS

## 2022-04-01 MED ORDER — LABETALOL HCL 5 MG/ML IV SOLN
INTRAVENOUS | Status: DC | PRN
  Administered 2022-04-01: 2.5 mg via INTRAVENOUS

## 2022-04-01 MED ORDER — SODIUM CHLORIDE 0.9 % IV SOLN
2.0000 g | INTRAVENOUS | Status: AC
  Administered 2022-04-01: 2 g via INTRAVENOUS

## 2022-04-01 MED ORDER — MIDAZOLAM HCL 5 MG/5ML IJ SOLN
INTRAMUSCULAR | Status: DC | PRN
  Administered 2022-04-01 (×2): 1 mg via INTRAVENOUS

## 2022-04-01 MED ORDER — ONDANSETRON HCL 4 MG/2ML IJ SOLN
INTRAMUSCULAR | Status: AC
  Filled 2022-04-01: qty 2

## 2022-04-01 MED ORDER — POLYETHYLENE GLYCOL 3350 17 GM/SCOOP PO POWD: 1.0000 | Freq: Once | ORAL | Status: DC

## 2022-04-01 MED ORDER — BUPIVACAINE LIPOSOME 1.3 % IJ SUSP
INTRAMUSCULAR | Status: AC
  Filled 2022-04-01: qty 20

## 2022-04-01 MED ORDER — ALBUMIN HUMAN 5 % IV SOLN: INTRAVENOUS | Status: DC | PRN

## 2022-04-01 MED ORDER — SODIUM CHLORIDE 0.9 % IR SOLN
Status: DC | PRN
  Administered 2022-04-01: 1000 mL

## 2022-04-01 MED ORDER — EPHEDRINE 5 MG/ML INJ
INTRAVENOUS | Status: AC
  Filled 2022-04-01: qty 5

## 2022-04-01 MED ORDER — DEXMEDETOMIDINE (PRECEDEX) IN NS 20 MCG/5ML (4 MCG/ML) IV SYRINGE
PREFILLED_SYRINGE | INTRAVENOUS | Status: DC | PRN
  Administered 2022-04-01: 8 ug via INTRAVENOUS
  Administered 2022-04-01 (×2): 4 ug via INTRAVENOUS

## 2022-04-01 MED ORDER — STERILE WATER FOR INJECTION IJ SOLN
INTRAMUSCULAR | Status: AC
  Filled 2022-04-01: qty 10

## 2022-04-01 MED ORDER — SODIUM CHLORIDE 0.9% FLUSH: 3.0000 mL | INTRAVENOUS | Status: DC | PRN

## 2022-04-01 MED ORDER — CHLORHEXIDINE GLUCONATE CLOTH 2 % EX PADS: 6.0000 | MEDICATED_PAD | Freq: Once | CUTANEOUS | Status: DC

## 2022-04-01 MED ORDER — PANTOPRAZOLE SODIUM 40 MG PO TBEC
40.0000 mg | DELAYED_RELEASE_TABLET | Freq: Every day | ORAL | Status: DC
Start: 2022-04-02 — End: 2022-04-04
  Administered 2022-04-02 – 2022-04-03 (×2): 40 mg via ORAL
  Filled 2022-04-01 (×2): qty 1

## 2022-04-01 MED ORDER — SODIUM CHLORIDE 0.9 % IV SOLN
2.0000 g | Freq: Two times a day (BID) | INTRAVENOUS | Status: AC
  Administered 2022-04-01: 2 g via INTRAVENOUS
  Filled 2022-04-01: qty 2

## 2022-04-01 MED ORDER — CALCIUM POLYCARBOPHIL 625 MG PO TABS
625.0000 mg | ORAL_TABLET | Freq: Two times a day (BID) | ORAL | Status: DC
  Administered 2022-04-01 – 2022-04-03 (×5): 625 mg via ORAL
  Filled 2022-04-01 (×5): qty 1

## 2022-04-01 MED ORDER — CHLORHEXIDINE GLUCONATE 0.12 % MT SOLN
15.0000 mL | Freq: Once | OROMUCOSAL | Status: AC
Start: 2022-04-01 — End: 2022-04-01
  Administered 2022-04-01: 15 mL via OROMUCOSAL

## 2022-04-01 MED ORDER — ENOXAPARIN SODIUM 40 MG/0.4ML IJ SOSY
40.0000 mg | PREFILLED_SYRINGE | INTRAMUSCULAR | Status: DC
  Administered 2022-04-02 – 2022-04-12 (×11): 40 mg via SUBCUTANEOUS
  Filled 2022-04-01 (×11): qty 0.4

## 2022-04-01 MED ORDER — METRONIDAZOLE 500 MG PO TABS
1000.0000 mg | ORAL_TABLET | ORAL | Status: DC
Start: 2022-04-01 — End: 2022-04-01

## 2022-04-01 MED ORDER — ENSURE SURGERY PO LIQD
237.0000 mL | Freq: Two times a day (BID) | ORAL | Status: DC
  Administered 2022-04-02 (×2): 237 mL via ORAL

## 2022-04-01 MED ORDER — PHENYLEPHRINE 80 MCG/ML (10ML) SYRINGE FOR IV PUSH (FOR BLOOD PRESSURE SUPPORT)
PREFILLED_SYRINGE | INTRAVENOUS | Status: DC | PRN
  Administered 2022-04-01: 40 ug via INTRAVENOUS
  Administered 2022-04-01: 80 ug via INTRAVENOUS
  Administered 2022-04-01 (×2): 40 ug via INTRAVENOUS
  Administered 2022-04-01: 80 ug via INTRAVENOUS
  Administered 2022-04-01 (×3): 40 ug via INTRAVENOUS
  Administered 2022-04-01: 80 ug via INTRAVENOUS
  Administered 2022-04-01: 160 ug via INTRAVENOUS
  Administered 2022-04-01: 40 ug via INTRAVENOUS

## 2022-04-01 MED ORDER — NEOMYCIN SULFATE 500 MG PO TABS
1000.0000 mg | ORAL_TABLET | ORAL | Status: DC
Start: 2022-04-01 — End: 2022-04-01

## 2022-04-01 MED ORDER — ACETAMINOPHEN 500 MG PO TABS
1000.0000 mg | ORAL_TABLET | Freq: Four times a day (QID) | ORAL | Status: DC
  Administered 2022-04-01 – 2022-04-04 (×10): 1000 mg via ORAL
  Filled 2022-04-01 (×10): qty 2

## 2022-04-01 MED ORDER — SIMETHICONE 80 MG PO CHEW
40.0000 mg | CHEWABLE_TABLET | Freq: Four times a day (QID) | ORAL | Status: DC | PRN
  Administered 2022-04-03 – 2022-04-08 (×3): 40 mg via ORAL
  Filled 2022-04-01 (×3): qty 1

## 2022-04-01 MED ORDER — ALVIMOPAN 12 MG PO CAPS
ORAL_CAPSULE | ORAL | Status: AC
  Administered 2022-04-01: 12 mg via ORAL
  Filled 2022-04-01: qty 1

## 2022-04-01 MED ORDER — PHENYLEPHRINE HCL-NACL 20-0.9 MG/250ML-% IV SOLN
INTRAVENOUS | Status: DC | PRN
  Administered 2022-04-01: 30 ug/min via INTRAVENOUS

## 2022-04-01 SURGICAL SUPPLY — 132 items
ADAPTER GOLDBERG URETERAL (ADAPTER) IMPLANT
ADPR CATH 15X14FR FL DRN BG (ADAPTER)
APL PRP STRL LF DISP 70% ISPRP (MISCELLANEOUS) ×3
APPLIER CLIP 5 13 M/L LIGAMAX5 (MISCELLANEOUS)
APPLIER CLIP ROT 10 11.4 M/L (STAPLE)
APR CLP MED LRG 11.4X10 (STAPLE)
APR CLP MED LRG 5 ANG JAW (MISCELLANEOUS)
BAG COUNTER SPONGE SURGICOUNT (BAG) ×5 IMPLANT
BAG SPNG CNTER NS LX DISP (BAG) ×3
BAG URO CATCHER STRL LF (MISCELLANEOUS) ×5 IMPLANT
BLADE EXTENDED COATED 6.5IN (ELECTRODE) IMPLANT
CANNULA REDUC XI 12-8 STAPL (CANNULA) ×4
CANNULA REDUCER 12-8 DVNC XI (CANNULA) IMPLANT
CATH URETL OPEN 5X70 (CATHETERS) IMPLANT
CELLS DAT CNTRL 66122 CELL SVR (MISCELLANEOUS) IMPLANT
CHLORAPREP W/TINT 26 (MISCELLANEOUS) ×1 IMPLANT
CLIP APPLIE 5 13 M/L LIGAMAX5 (MISCELLANEOUS) IMPLANT
CLIP APPLIE ROT 10 11.4 M/L (STAPLE) IMPLANT
CLOTH BEACON ORANGE TIMEOUT ST (SAFETY) ×5 IMPLANT
COVER SURGICAL LIGHT HANDLE (MISCELLANEOUS) ×10 IMPLANT
COVER TIP SHEARS 8 DVNC (MISCELLANEOUS) ×4 IMPLANT
COVER TIP SHEARS 8MM DA VINCI (MISCELLANEOUS) ×4
DEFOGGER SCOPE WARMER CLEARIFY (MISCELLANEOUS) ×1 IMPLANT
DEVICE TROCAR PUNCTURE CLOSURE (ENDOMECHANICALS) IMPLANT
DRAIN CHANNEL 19F RND (DRAIN) ×1 IMPLANT
DRAPE ARM DVNC X/XI (DISPOSABLE) ×16 IMPLANT
DRAPE COLUMN DVNC XI (DISPOSABLE) ×4 IMPLANT
DRAPE DA VINCI XI ARM (DISPOSABLE) ×16
DRAPE DA VINCI XI COLUMN (DISPOSABLE) ×4
DRAPE SURG IRRIG POUCH 19X23 (DRAPES) ×5 IMPLANT
DRSG OPSITE POSTOP 4X10 (GAUZE/BANDAGES/DRESSINGS) IMPLANT
DRSG OPSITE POSTOP 4X6 (GAUZE/BANDAGES/DRESSINGS) ×1 IMPLANT
DRSG OPSITE POSTOP 4X8 (GAUZE/BANDAGES/DRESSINGS) IMPLANT
DRSG TEGADERM 2-3/8X2-3/4 SM (GAUZE/BANDAGES/DRESSINGS) ×21 IMPLANT
DRSG TEGADERM 4X4.75 (GAUZE/BANDAGES/DRESSINGS) IMPLANT
ELECT PENCIL ROCKER SW 15FT (MISCELLANEOUS) ×5 IMPLANT
ELECT REM PT RETURN 15FT ADLT (MISCELLANEOUS) ×5 IMPLANT
ENDOLOOP SUT PDS II  0 18 (SUTURE)
ENDOLOOP SUT PDS II 0 18 (SUTURE) IMPLANT
EVACUATOR SILICONE 100CC (DRAIN) IMPLANT
GAUZE SPONGE 2X2 8PLY STRL LF (GAUZE/BANDAGES/DRESSINGS) ×4 IMPLANT
GLOVE ECLIPSE 8.0 STRL XLNG CF (GLOVE) ×15 IMPLANT
GLOVE SURG LX 7.5 STRW (GLOVE) ×1
GLOVE SURG LX STRL 7.5 STRW (GLOVE) ×4 IMPLANT
GOWN SRG XL LVL 4 BRTHBL STRL (GOWNS) ×4 IMPLANT
GOWN STRL NON-REIN XL LVL4 (GOWNS) ×4
GOWN STRL REUS W/ TWL XL LVL3 (GOWN DISPOSABLE) ×16 IMPLANT
GOWN STRL REUS W/TWL XL LVL3 (GOWN DISPOSABLE) ×16
GRASPER SUT TROCAR 14GX15 (MISCELLANEOUS) IMPLANT
GUIDEWIRE ANG ZIPWIRE 038X150 (WIRE) IMPLANT
GUIDEWIRE STR DUAL SENSOR (WIRE) IMPLANT
HOLDER FOLEY CATH W/STRAP (MISCELLANEOUS) ×5 IMPLANT
IRRIG SUCT STRYKERFLOW 2 WTIP (MISCELLANEOUS) ×4
IRRIGATION SUCT STRKRFLW 2 WTP (MISCELLANEOUS) ×4 IMPLANT
KIT PROCEDURE DA VINCI SI (MISCELLANEOUS) ×8
KIT PROCEDURE DVNC SI (MISCELLANEOUS) ×4 IMPLANT
KIT SIGMOIDOSCOPE (SET/KITS/TRAYS/PACK) ×1 IMPLANT
KIT TURNOVER KIT A (KITS) ×1 IMPLANT
MANIFOLD NEPTUNE II (INSTRUMENTS) ×5 IMPLANT
NDL INSUFFLATION 14GA 120MM (NEEDLE) ×4 IMPLANT
NEEDLE INSUFFLATION 14GA 120MM (NEEDLE) ×4 IMPLANT
PACK CARDIOVASCULAR III (CUSTOM PROCEDURE TRAY) ×5 IMPLANT
PACK COLON (CUSTOM PROCEDURE TRAY) ×5 IMPLANT
PACK CYSTO (CUSTOM PROCEDURE TRAY) ×5 IMPLANT
PAD POSITIONING PINK XL (MISCELLANEOUS) ×5 IMPLANT
PROTECTOR NERVE ULNAR (MISCELLANEOUS) ×10 IMPLANT
RELOAD STAPLE 45 3.5 BLU DVNC (STAPLE) IMPLANT
RELOAD STAPLE 45 4.3 GRN DVNC (STAPLE) IMPLANT
RELOAD STAPLE 60 3.5 BLU DVNC (STAPLE) IMPLANT
RELOAD STAPLE 60 4.3 GRN DVNC (STAPLE) IMPLANT
RELOAD STAPLER 3.5X45 BLU DVNC (STAPLE) IMPLANT
RELOAD STAPLER 3.5X60 BLU DVNC (STAPLE) IMPLANT
RELOAD STAPLER 4.3X45 GRN DVNC (STAPLE) IMPLANT
RELOAD STAPLER 4.3X60 GRN DVNC (STAPLE) ×3 IMPLANT
RETRACTOR WND ALEXIS 18 MED (MISCELLANEOUS) IMPLANT
RTRCTR WOUND ALEXIS 18CM MED (MISCELLANEOUS)
SCISSORS LAP 5X35 DISP (ENDOMECHANICALS) ×5 IMPLANT
SEAL CANN UNIV 5-8 DVNC XI (MISCELLANEOUS) ×12 IMPLANT
SEAL XI 5MM-8MM UNIVERSAL (MISCELLANEOUS) ×12
SEALER VESSEL DA VINCI XI (MISCELLANEOUS) ×4
SEALER VESSEL EXT DVNC XI (MISCELLANEOUS) ×4 IMPLANT
SOLUTION ELECTROLUBE (MISCELLANEOUS) ×5 IMPLANT
SPIKE FLUID TRANSFER (MISCELLANEOUS) ×5 IMPLANT
SPONGE GAUZE 2X2 STER 10/PKG (GAUZE/BANDAGES/DRESSINGS) ×1
STAPLER 45 DA VINCI SURE FORM (STAPLE)
STAPLER 45 SUREFORM DVNC (STAPLE) IMPLANT
STAPLER 60 DA VINCI SURE FORM (STAPLE) ×4
STAPLER 60 SUREFORM DVNC (STAPLE) IMPLANT
STAPLER CANNULA SEAL DVNC XI (STAPLE) ×4 IMPLANT
STAPLER CANNULA SEAL XI (STAPLE) ×4
STAPLER ECHELON POWER CIR 29 (STAPLE) ×1 IMPLANT
STAPLER ECHELON POWER CIR 31 (STAPLE) IMPLANT
STAPLER RELOAD 3.5X45 BLU DVNC (STAPLE)
STAPLER RELOAD 3.5X45 BLUE (STAPLE)
STAPLER RELOAD 3.5X60 BLU DVNC (STAPLE)
STAPLER RELOAD 3.5X60 BLUE (STAPLE)
STAPLER RELOAD 4.3X45 GREEN (STAPLE)
STAPLER RELOAD 4.3X45 GRN DVNC (STAPLE)
STAPLER RELOAD 4.3X60 GREEN (STAPLE) ×4
STAPLER RELOAD 4.3X60 GRN DVNC (STAPLE) ×3
STOPCOCK 4 WAY LG BORE MALE ST (IV SETS) ×10 IMPLANT
SURGILUBE 2OZ TUBE FLIPTOP (MISCELLANEOUS) ×1 IMPLANT
SUT MNCRL AB 4-0 PS2 18 (SUTURE) ×6 IMPLANT
SUT PDS AB 1 CT1 27 (SUTURE) ×10 IMPLANT
SUT PROLENE 0 CT 2 (SUTURE) IMPLANT
SUT PROLENE 2 0 KS (SUTURE) ×1 IMPLANT
SUT PROLENE 2 0 SH DA (SUTURE) IMPLANT
SUT SILK 2 0 (SUTURE)
SUT SILK 2 0 SH CR/8 (SUTURE) IMPLANT
SUT SILK 2-0 18XBRD TIE 12 (SUTURE) IMPLANT
SUT SILK 3 0 (SUTURE)
SUT SILK 3 0 SH CR/8 (SUTURE) ×5 IMPLANT
SUT SILK 3-0 18XBRD TIE 12 (SUTURE) IMPLANT
SUT V-LOC BARB 180 2/0GR6 GS22 (SUTURE)
SUT V-LOC BARB 180 2/0GR9 GS23 (SUTURE) ×8
SUT VIC AB 3-0 SH 18 (SUTURE) IMPLANT
SUT VIC AB 3-0 SH 27 (SUTURE)
SUT VIC AB 3-0 SH 27XBRD (SUTURE) IMPLANT
SUT VICRYL 0 UR6 27IN ABS (SUTURE) ×5 IMPLANT
SUT VLOC 3-0 9IN GRN (SUTURE) ×1 IMPLANT
SUTURE V-LC BRB 180 2/0GR6GS22 (SUTURE) IMPLANT
SUTURE V-LC BRB 180 2/0GR9GS23 (SUTURE) IMPLANT
SYR 10ML ECCENTRIC (SYRINGE) ×4 IMPLANT
SYS LAPSCP GELPORT 120MM (MISCELLANEOUS)
SYS WOUND ALEXIS 18CM MED (MISCELLANEOUS) ×4
SYSTEM LAPSCP GELPORT 120MM (MISCELLANEOUS) IMPLANT
SYSTEM WOUND ALEXIS 18CM MED (MISCELLANEOUS) ×4 IMPLANT
TOWEL OR NON WOVEN STRL DISP B (DISPOSABLE) ×5 IMPLANT
TROCAR ADV FIXATION 5X100MM (TROCAR) ×5 IMPLANT
TUBING CONNECTING 10 (TUBING) ×15 IMPLANT
TUBING INSUFFLATION 10FT LAP (TUBING) ×5 IMPLANT
TUBING UROLOGY SET (TUBING) IMPLANT

## 2022-04-01 NOTE — Op Note (Addendum)
04/01/2022  3:36 PM  PATIENT:  Regina Roberts  71 y.o. female  Patient Care Team: Plotnikov, Evie Lacks, MD as PCP - General Fontaine, Belinda Block, MD (Inactive) as Consulting Physician (Gynecology) Pyrtle, Lajuan Lines, MD as Consulting Physician (Gastroenterology) Clovis Riley, MD as Consulting Physician (General Surgery) Rutherford Guys, MD as Consulting Physician (Ophthalmology) Michael Boston, MD as Consulting Physician (Colon and Rectal Surgery)  PRE-OPERATIVE DIAGNOSIS:  COLOSTOMY FOR COLON RESECTION COLORECTAL FISTULA DESIRE FOR OSTOMY TAKEDOWN  POST-OPERATIVE DIAGNOSIS:   HISTORY OF PERFORATED DIVERTICULITIS S/P HARTMANN RESECTION (COLECTOMY/COLOSTOMY) CECUM-to RECTAL STUMP FISTULA INCOMPLETE RECTOSIGMOID STUMP-to-VAGINAL CUFF FISTULA DESIRE FOR OSTOMY TAKEDOWN  PROCEDURE:   ROBOTIC COLOSTOMY TAKEDOWN, LOW ANTERIOR RECTOSIGMOID RESECTION TAKEDOWN OF CECUM-to RECTAL STUMP FISTULA TAKEDOWN OF RECTOSIGMOID STUMP-to-VAGINAL CUFF FISTULA  ROBOTIC LYSIS OF ADHESIONS x 2.5 hours ILEAL REPAIR RIGID PROCTOSCOPY TRANSVERSUS ABDOMINIS PLANE (TAP) BLOCK - BILATERAL  SURGEON:  Michael Boston, MD, FACS  ASSISTANT:  Greer Pickerel, MD, FACS  ANESTHESIA:   local and general  EBL:  Total I/O In: 1350 [I.V.:1000; IV Piggyback:350] Out: 300 [Urine:200; Blood:100]  Delay start of Pharmacological VTE agent (>24hrs) due to surgical blood loss or risk of bleeding:  no  DRAINS: 19Fr Blake drain in pelvis   SPECIMEN:   End colostomy Rectosigmoid stump with fistula to cecum Distal anastomotic ring  DISPOSITION OF SPECIMEN:  PATHOLOGY  COUNTS:  YES  PLAN OF CARE: Admit to inpatient   PATIENT DISPOSITION:  PACU - hemodynamically stable.  INDICATION: Pleasant patient status post colectomy with end ostomy as well as ileocecal resection for perforated sigmoid diverticulitis.  The patient has recovered from that surgery and has understandably requested ostomy takedown.  Concern for  possible fistula to the from the rectal stump to the cecum medically stabilized and felt reasonable to proceed.   I discussed the procedure with the patient:  The anatomy & physiology of the digestive tract was discussed.  The pathophysiology was discussed.  Possibility of remaining with an ostomy permanently was discussed.  I offered ostomy takedown.  Minimally invasive & open techniques were discussed.   Risks such as bleeding, infection, abscess, leak, reoperation, possible re-ostomy, injury to other organs, hernia, heart attack, death, and other risks were discussed.   I noted a good likelihood this will help address the problem.  Goals of post-operative recovery were discussed as well.  We will work to minimize complications.  Questions were answered.  The patient expresses understanding & wishes to proceed with surgery.  OR FINDINGS: .  Patient had moderately dense adhesions of omentum to anterior wall as well as small bowel adhesions and specially down the pelvis.  Patient had very dense adhesions between the proximal right corner of the rectosigmoid stump to the posterior cecal wall that correlated with the cecum to rectosigmoid stump fistula.  Patient had rectosigmoid densely adherent to the vaginal cuff with a pocket suspicious for either nearly resolved abscess or possible and complete fistula from rectal stump to the vaginal cuff.  Both areas repaired with absorbable serrated V-Loc suture in a running fashion on the cecum and vaginal cuff.  Rectosigmoid resection done of the 2 potential fistulous connections.    It is a and descending colon to and proximal rectum 29 EEA stapled anastomosis.  Anastomosis rests 12 cm from the anal verge.  CASE DATA:  Type of patient?: Elective [facility] Private Case  Status of Case? Elective Scheduled  Infection Present At Time Of Surgery (PATOS)?  PHLEGMON  DESCRIPTION:   Informed consent  was confirmed.  The patient underwent general anaesthesia without  difficulty.  The patient was positioned appropriately.  VTE prevention in place.  Patient underwent cystoscopy with firefly injection of bilateral ureters by Dr. Jeffie Pollock with [facility] to help visualize the anatomy.  Please see his separate operative report.  The patient's abdomen was clipped, prepped, & draped in a sterile fashion.  Surgical timeout confirmed our plan.  Peritoneal entry with a laparoscopic port was obtained using Varess spring needle entry technique in the left upper abdomen as the patient was positioned in reverse Trendelenburg.  I induced carbon dioxide insufflation.  No change in end tidal CO2 measurements.  Full symmetrical abdominal distention.  Initial port was carefully placed.  Camera inspection revealed no injury.  Moderately dense central and left-sided abdominal wall adhesions.  Right side relatively clear extra ports were carefully placed under direct laparoscopic visualization.  Xi robot carefully docked & instruments placed.  We then continued with minimally invasive exploration.  I worked to freed adhesions to the anterior abdominal wall parietal peritoneum.  Freed omental adhesions off the colostomy and visceral peritoneum.  Make sure to free all small bowel interloop adhesions and adhesions to the left retroperitoneum and colostomy mesentery and pelvis.  This took several hours.  Eventually freed numerous small bowel loops and interloop adhesions.    We focused on dissection down in the pelvis.  Work to free adhesions and identify the rectal stump.  Noticed a phlegmon between the posterior cecal wall and a candidate for the rectal stump.  Eventually elevated.  However there was a dense connection.  The seem to correlate with the identified fistula from the rectosigmoid stump to the cecum at the site of her prior ileocolectomy.  I transected through the fistula on the rectal stump side.  Inspected and thought there was a persistent fistulous collection to the posterior  cecal wall as suspected on preoperative imaging.  I closed this using absorbable serrated V-Loc suture running x2 layers and brought some epiploic appendages and redundant mesentery over the repair and patch that up for mesentero-pexy.  Noted area of ileum will full-thickness injury which was not surprising given the severely dense adhesions.  This was primarily repaired using absorbable V-Loc suture to good result.  We continued pelvic dissection.  Very dense inflammation of the visceral peritoneum down on the pelvic reflection.  Elevated the rectosigmoid.  Clearly there was some sigmoid colon remaining.  Came across the right and left lateral rectal visceral peritoneum and worked to try and identify the vaginal cuff.  Ended up decompressijng a 4cm simple left ovarian cyst.  Got the left adnexa off.  The right was rather obliterated consistent with her prior right salpingo-oophorectomy and supracervical hysterectomy.  I worked to do dilators and rigid proctoscopy to help identify the anatomy.  Patient cervix still in the vaginal cuff seems smooth and obliterated.  There was an obvious adhesion of the rectosigmoid colon to the vaginal cuff.  There was some kinking of the rectosigmoid colon consistent with an anterior fold due to that rectosigmoid adhesion.  However no resistance or abnormality up to around 13 cm.  Went back to dissection and elevated the vaginal cuff off the rectosigmoid anterior attachment and came through it.  Seem rather thickened and encountered a cavity.  Do not know if this was a resolved abscess versus a fistulous connected to the vaginal cuff as well.  Cannot prove any definite opening from the vaginal cuff side but decided to oversew it  with V-Loc suture to be safe in 2 layers.  I transected the rectosigmoid mesocolon until I came to proximal mesorectum at the proximal rectum.  Did EEA sizers and confirmed good dilation of the rectum and a 33 sizer could easily pass.  I did insufflation  of the rectal cuff after all this dissection with fluid and there was no air leak implying no injury to the rectum itself.  Did repeat inspection and freed greater omentum adhesions to the colon and freed off the transverse colon to descending colon colostomy adhesions and numerous small bowel interloop adhesions that were clear from the ileocecal anastomosed to the ligament of Treitz was rather twisted upon itself with some closed-loop like adhesions and some dilated and decompressed bowel but no full obstruction and no ischemia.  H hemostasis was good.  We did reinspection and saw no injury or other concerns.    Felt it was safe to proceed with colostomy takedown.  We undocked the robot and decompress gas We made an incision around the ostomy.  I got into the subcutaneous tissues.  I used careful focused right angle dissection and sharp dissection.  Some focused cautery dissection as well.  That helped to free adhesions to the subcutaneous tisses & fascia.  I was able to enter into the peritoneum focally.  I did a gentle finger sweep.  Gradually came around circumferentially and freed the bowel from remaining adhesions to the abdominal wall.  We were able eviscerate the ostomy to do inspection and assured viability and hemostasis.  I chose as distal the location that was viable for the proximal end of the anastomosis.  I clamped the colon at the point of resection using a reusable pursestringer device.  Passed a 2-0 Keith needle. I transected at the descending/sigmoid junction with a scalpel. I got healthy bleeding mucosa.  Transected to the mesentery and a good arterial marginal artery that we clamped and ligated with silk implying good blood supply to the distal end.  We sent the rectosigmoid colon specimen off to go to pathology.  We sized the colon orifice.   I chose a 77m EEA anvil stapler system.  I reinforced the prolene pursestring with interrupted silk "belt loop" sutures.  I placed the anvil to the  open end of the proximal remaining colon and closed around it using the pursestring.     Returned the viscera back into the abdomen, redocked the robot and did inspection of the abdomen.  We did copious irrigation with crystalloid solution.  Hemostasis was good.  I transected at the proximal rectum using a robotic 60 mm green load stapler to get a healthy viable cuff of proximal rectum.  The distal end of the colon at the handle easily reached down to the rectal stump, therefore, splenic flexure mobilization was not needed.   Dr WRedmond Pulling scrubbed down and did gentle anal dilation and advanced the EEA stapler up the rectal stump. The spike was brought out at the provimal end of the rectal stump under direct visualization.  I  attached the anvil of the proximal colon the spike of the stapler. Anvil was tightened down and held clamped for 60 seconds.  Orientation was confirmed such that there is no twisting of the colon nor small bowel underneath the mesenteric defect. No concerning tension.  The EEA stapler was fired and held clamped for 30 seconds. The stapler was released & removed. Blue stitch is in the proximal ring.  Care was taken to ensure no  other structures were incorporated within this either.  We noted 2 excellent anastomotic rings.  The colon proximal to the anastomosis was then gently occluded. The pelvis was filled with sterile irrigation.   Dr Redmond Pulling  insufflate did not cross the colorectal anastomosis transanally.  There was a negative air leak test.  They did rigid proctoscopy noted the anastomosis was at 12 cm from the anal verge consistent with the proximal rectum.  There was no tension of mesentery or bowel at the anastomosis.   Tissues looked viable.  Ureters & bowel uninjured.  The anastomosis looked healthy. Greater omentum positioned down into the pelvis to help protect the anastomosis.  Given the 2 fistulous connections with repair and decided to leave a drain to be safe.  I removed CO2 gas  out through the ports.  Ports and will protector and instruments removed.  We changed gown and gloves.  The patient was re-draped.  Sterile unused instruments were used from this point out per colon SSI prevention protocol.  I closed the 66m port sites using Monocryl stitch and sterile dressing.  I closed the fascia of the abdominal wall ostomy wound using #1 PDS.  Because colostomy wound was rather small and closed it with pursestring of Vicryl suture at Scarpa's and Monocryl suture along the dermis. I placed antibiotic-soaked wicks into the central part of the pursestring closure. I placed a sterile dressing.    Patient is being extubated go to recovery room. I discussed postop care with the patient in detail the office & in the holding area. Instructions are written.  I discussed operative findings, updated the patient's status, discussed probable steps to recovery, and gave postoperative recommendations to the patient's spouse.  Recommendations were made.  Questions were answered.  He expressed understanding & appreciation.    SAdin Hector M.D., F.A.C.S. [facility] [address] [place] [phone] / Paging

## 2022-04-01 NOTE — Transfer of Care (Signed)
Immediate Anesthesia Transfer of Care Note  Patient: Regina Roberts  Procedure(s) Performed: ROBOTIC OSTOMY TAKEDOWN, TAKEDOWN COLORECTAL STUMP FISTULA, VAGINAL CUFF REPAIR, AND BILATERAL TAP BLOCK LYSIS OF ADHESION RIGID PROCTOSCOPY SMALL BOWEL REPAIR CYSTOSCOPY with FIREFLY INJECTION (Bilateral)  Patient Location: PACU  Anesthesia Type:General  Level of Consciousness: awake, drowsy and patient cooperative  Airway & Oxygen Therapy: Patient Spontanous Breathing and Patient connected to face mask oxygen  Post-op Assessment: Report given to RN and Post -op Vital signs reviewed and stable  Post vital signs: Reviewed and stable  Last Vitals:  Vitals Value Taken Time  BP 131/66   Temp    Pulse 88 04/01/22 1538  Resp 16 04/01/22 1538  SpO2 96 % 04/01/22 1538  Vitals shown include unvalidated device data.  Last Pain:  Vitals:   04/01/22 0813  TempSrc: Oral  PainSc: 0-No pain         Complications: No notable events documented.

## 2022-04-01 NOTE — H&P (Signed)
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 female who is seen today  as an office consultation at the request of Dr. Kae Heller  for evaluation of Follow-up (Fistula from rectum) .   Pleasant obese woman. Had perforated sigmoid diverticulitis requiring emergency exploratory laparotomy with lysis of adhesions and Hartmann resection. She had very dense adhesions from her prior hysterectomy. Required yearly cecectomy as well. Recovered and discharged a week later. She has been followed by Dr. Windle Guard since then. She is back to regular daily activity. There was discussion about colostomy takedown. Barium enema raised question of a fistula between the rectal stump to ascending colon. Repeat CT scan in March confirmed a rectosigmoid stump tail going up to the right lower quadrant colon with contrast going up to it suspicious for fistula. Given the increased complexity, referral to myself given expertise in colorectal surgery.  Patient comes a by herself. She emptied her colostomy bag about once a day. She is not on a fiber supplement. She notes that she has had 2 episodes of passing old stool out the anus since she had emergency surgery. She had some pressure and evacuated 1 stool ball last week. No diarrhea or bleeding. She did have an underwhelming colonoscopy in 2018 by Dr. Hilarie Fredrickson with 10-year follow-up recommended. No polyps were noted. She notes she can walk an hour a day without difficulty. She does not smoke. No diabetes. A1c underwhelming last year. She had a hysterectomy and hernia repair in the past but no other abdominal surgeries. No history of prior bowel obstructions. No urinary incontinence or  UTIs. No history of cardiac or pulmonary issues that she is aware of.  Medical History:  Past Medical History:  Diagnosis Date   Anemia   GERD (gastroesophageal reflux disease)   Hyperlipidemia   Hypertension   Patient Active Problem List  Diagnosis   Allergic rhinitis   Colostomy in place (CMS-HCC)   Cough   Decreased GFR   Diverticulitis   Dizziness   Dyslipidemia   Edema   Essential hypertension   Frequency of urination   GERD (gastroesophageal reflux disease)   Hyperglycemia   Low back pain   Neoplasm of uncertain behavior of skin   Obesity, Class II, BMI 35-39.9, unspecified   Premenopausal menorrhagia   Pyogenic granuloma of skin and subcutaneous tissue   Raynaud phenomenon   Shoulder pain   Swelling of both hands   Tachycardia   Ulnar nerve neuropathy   Well adult exam   Diverticulitis of colon with perforation   Colonic fistula   Past Surgical History:  Procedure Laterality Date   HYSTERECTOMY    Allergies  Allergen Reactions   Lisinopril Other (See Comments)  cough   Phentermine Other (See Comments)  Raynauld's ? Tachycardia   Current Outpatient Medications on File Prior to Visit  Medication Sig Dispense Refill   atorvastatin (LIPITOR) 10 MG tablet Take by mouth   losartan (COZAAR) 100 MG tablet Take by mouth   montelukast (SINGULAIR) 10 mg tablet   pantoprazole (PROTONIX) 40 MG DR tablet Take 40 mg by mouth once daily   No current facility-administered medications on file prior to visit.   No family history on file.   Social  History   Tobacco Use  Smoking Status Never  Smokeless Tobacco Never    Social History   Socioeconomic History   Marital status: Unknown  Tobacco Use   Smoking status: Never   Smokeless tobacco: Never  Vaping Use   Vaping Use: Never used  Substance and Sexual Activity   Alcohol use: Not Currently   Drug use: Never   Sexual activity: Defer    ############################################################  Review of Systems: A complete review of systems (ROS) was obtained from the patient. I have reviewed this information and discussed as appropriate with the patient. See HPI as well for other pertinent ROS.  Constitutional: No fevers, chills, sweats. Weight stable Eyes: No vision changes, No discharge HENT: No sore throats, nasal drainage Lymph: No neck swelling, No bruising easily Pulmonary: No cough, productive sputum CV: No orthopnea, PND . No exertional chest/neck/shoulder/arm pain. Patient can walk 30 minutes without difficulty.   GI: No personal nor family history of GI/colon cancer, inflammatory bowel disease, irritable bowel syndrome, allergy such as Celiac Sprue, dietary/dairy problems, colitis, ulcers nor gastritis. No recent sick contacts/gastroenteritis. No travel outside the country. No changes in diet.  Renal: No UTIs, No hematuria Genital: No drainage, bleeding, masses Musculoskeletal: No severe joint pain. Good ROM major joints Skin: No sores or lesions Heme/Lymph: No easy bleeding. No swollen lymph nodes Neuro: No active seizures. No facial droop Psych: No hallucinations. No agitation  OBJECTIVE   There were no vitals filed for this visit.  There is no height or weight on file to calculate BMI.  PHYSICAL EXAM:  Constitutional: Not cachectic. Hygeine adequate. Vitals signs as above.  Eyes: Pupils reactive, normal extraocular movements. Sclera nonicteric Neuro: CN II-XII intact. No major focal sensory defects. No major motor deficits. Lymph: No head/neck/groin lymphadenopathy Psych: No severe agitation. No severe anxiety. Judgment & insight Adequate, Oriented x4, HENT: Normocephalic, Mucus membranes moist. No thrush. Hearing: adequate Neck: Supple, No tracheal deviation. No obvious thyromegaly Chest: No pain to chest wall compression. Good respiratory excursion. No audible wheezing CV: Pulses intact.  regular. No major extremity edema Ext: No obvious deformity or contracture. Edema: Not present. No cyanosis Skin: No major subcutaneous nodules. Warm and dry Musculoskeletal: Severe joint rigidity not present. No obvious clubbing. No digital petechiae. Mobility: no assist device moving easily without restrictions  Abdomen: Obese Soft. Nondistended. Nontender. Midline incision with hypertrophic scarring but no hernia. Left upper paramedian colostomy pink in place with hard stool. Hernia: Not present. Diastasis recti: Not present. No hepatomegaly. No splenomegaly.  Genital/Pelvic: Inguinal hernia: Not present. Inguinal lymph nodes: without lymphadenopathy nor hidradenitis.   Rectal: Perianal skin clear. Normal sphincter tone. Soft rectal stool in vault. No fissure fistula abscess or hemorrhoids.    ###################################################################  Labs, Imaging and Diagnostic Testing:  Located in [place]' section of Epic EMR chart  PRIOR [facility] CLINIC NOTES:  Located in [place]' section of Epic EMR chart  SURGERY NOTES:  Located in [place]' section of Epic EMR chart  PATHOLOGY:  Located in [place]' section of Epic EMR chart  Assessment and Plan:  DIAGNOSES:  Diagnoses and all orders for this visit:  Diverticulitis of colon with perforation  Colostomy in place (CMS-HCC)  Colonic fistula  Other orders - ORDER - EXTERNAL RADIOLOGY RESULT -CT - polyethylene glycol (MIRALAX) powder; Take 233.75 g by mouth once for 1 dose Take according to your procedure prep instructions. - bisacodyL (DULCOLAX) 5 mg EC tablet; Take 4 tablets (20 mg total) by mouth once  daily as needed for Constipation for up to 1 dose - metroNIDAZOLE (FLAGYL) 500 MG tablet; Take 2 tablets (1,000 mg total) by mouth 3 (three) times daily for 3 doses SEE BOWEL PREP INSTRUCTIONS: Take 2 tablets at 2pm, 3pm, and 10pm the day prior to your colon operation. -  neomycin 500 mg tablet; Take 2 tablets (1,000 mg total) by mouth 3 (three) times daily for 3 doses SEE BOWEL PREP INSTRUCTIONS: Take 2 tablets at 2pm, 3pm, and 10pm the day prior to your colon operation.    ASSESSMENT/PLAN  Pleasant woman with perforated sigmoid diverticulitis quiring Hartmann resection. Severe pelvic adhesions requiring ileocolonic resection. Question of fistulous connection between rectal stump to right-sided ascending colon by x-ray but clinically no strong evidence of any major colonic fistula to the rectal stump.   It has been almost a year since her emergency surgery. She is back to regular activity. I think it is reasonable to consider colostomy takedown. We will plan a minimal invasive robotic approach. Suspect prolonged lyse adhesions given her dense adhesions before an emergency surgery. We will see. Most likely will be able to find the rectosigmoid stump going up to the colon. If confirmed will staple a cuff of the right colon and then trim the rectosigmoid stump down to the proximal rectum for more clean descending colon to end rectal EEA anastomosis.   The anatomy & physiology of the digestive tract was discussed. The pathophysiology was discussed. Possibility of remaining with an ostomy permanently was discussed. I offered ostomy takedown. Laparoscopic & open techniques were discussed.   Risks such as bleeding, infection, abscess, leak, reoperation, possible re-ostomy, injury to other organs, need for repair of tissues / organs, need for further treatment, hernia, heart attack, death, and other risks were discussed. I noted a good likelihood this will help address the problem. Goals of post-operative recovery were discussed as well. We will work to minimize complications. Questions were answered. The patient expresses understanding & wishes to proceed with surgery.   I think it would be wise to have firefly injection by urology to start his case by cystoscopy to help  identify the ureters and minimize problems.  She had an underwhelming colonoscopy less than 5 years ago with no strong family or personal history. Do not feel strongly we need to repeat that.    Adin Hector, MD, FACS, MASCRS Esophageal, Gastrointestinal & Colorectal Surgery Robotic and Minimally Invasive Surgery  [facility], [place]  [place]. [address] [place],  [address] [phone] Fax [phone] Main  CONTACT INFORMATION:  Weekday (9AM-5PM): Call [facility] main office at [phone]  Weeknight (5PM-9AM) or Weekend/Holiday: Check [email or web address] (password " [email or web address]") for General Surgery [facility] coverage  (Please, do not use SecureChat as it is not reliable communication to operating surgeons for immediate patient care)    04/01/2022

## 2022-04-01 NOTE — Anesthesia Postprocedure Evaluation (Signed)
Anesthesia Post Note  Patient: Systems analyst  Procedure(s) Performed: ROBOTIC OSTOMY TAKEDOWN, TAKEDOWN COLORECTAL STUMP FISTULA, VAGINAL CUFF REPAIR, AND BILATERAL TAP BLOCK LYSIS OF ADHESION RIGID PROCTOSCOPY SMALL BOWEL REPAIR CYSTOSCOPY with FIREFLY INJECTION (Bilateral)     Patient location during evaluation: PACU Anesthesia Type: General Level of consciousness: awake and alert Pain management: pain level controlled Vital Signs Assessment: post-procedure vital signs reviewed and stable Respiratory status: spontaneous breathing, nonlabored ventilation, respiratory function stable and patient connected to nasal cannula oxygen Cardiovascular status: blood pressure returned to baseline and stable Postop Assessment: no apparent nausea or vomiting Anesthetic complications: no   No notable events documented.  Last Vitals:  Vitals:   04/01/22 1822 04/01/22 1930  BP: 121/72 140/83  Pulse: 93 93  Resp: 16 18  Temp: 36.7 C 36.6 C  SpO2: 97% 97%    Last Pain:  Vitals:   04/01/22 1934  TempSrc:   PainSc: 6                  Regina Roberts

## 2022-04-01 NOTE — Interval H&P Note (Signed)
History and Physical Interval Note:  04/01/2022 8:02 AM  Regina Roberts  has presented today for surgery, with the diagnosis of COLOSTOMY FOR COLON RESECTION COLORECTAL FISTULA DESIRE FOR OSTOMY TAKEDOWN.  The various methods of treatment have been discussed with the patient and family. After consideration of risks, benefits and other options for treatment, the patient has consented to  Procedure(s): ROBOTIC OSTOMY TAKEDOWN, TAKEDOWN COLORECTAL STUMP FISTULA (N/A) LYSIS OF ADHESION (N/A) RIGID PROCTOSCOPY (N/A) CYSTOSCOPY with FIREFLY INJECTION (Bilateral) as a surgical intervention.  The patient's history has been reviewed, patient examined, no change in status, stable for surgery.  I have reviewed the patient's chart and labs.  Questions were answered to the patient's satisfaction.    I have re-reviewed the the patient's records, history, medications, and allergies.  I have re-examined the patient.  I again discussed intraoperative plans and goals of post-operative recovery.  The patient agrees to proceed.  Regina Roberts  [birth year] [id number]  Patient Care Team: Plotnikov, Evie Lacks, MD as PCP - General Fontaine, Belinda Block, MD (Inactive) as Consulting Physician (Gynecology) Pyrtle, Lajuan Lines, MD as Consulting Physician (Gastroenterology) Clovis Riley, MD as Consulting Physician (General Surgery) Rutherford Guys, MD as Consulting Physician (Ophthalmology)  Patient Active Problem List   Diagnosis Date Noted   Colonic fistula 02/28/2022   Diverticulitis of colon with perforation 02/28/2022   Colostomy in place [facility]) 09/08/2021   Diverticulitis 03/13/2021   Frequency of urination 03/08/2021   Decreased GFR 09/10/2020   Neoplasm of uncertain behavior of skin 09/07/2020   Well adult exam 09/07/2020   Hyperglycemia 09/07/2020   GERD (gastroesophageal reflux disease) 02/20/2019   Tachycardia 05/21/2018   Cough 11/01/2017   Allergic rhinitis 11/01/2017   Ulnar nerve  neuropathy 05/16/2017   Raynaud phenomenon 02/01/2017   Obesity, Class II, BMI 35-39.9 06/09/2015   Dizziness 03/09/2015   Swelling of both hands 09/08/2014   SHOULDER PAIN 05/18/2010   Edema 05/18/2010   GRANULOMA, INFECTIOUS 05/15/2009   Dyslipidemia 07/16/2008   MENOPAUSAL SYNDROME 07/16/2008   LOW BACK PAIN 07/16/2008   Essential hypertension 07/16/2008    Past Medical History:  Diagnosis Date   Allergy    sesonal   Blood transfusion without reported diagnosis 1984   GERD (gastroesophageal reflux disease)    Hyperlipidemia    Hypertension    Osteopenia 10/2018   T score -1.8 FRAX 3.4% / 0.3% able from prior DEXA    Past Surgical History:  Procedure Laterality Date   HERNIA REPAIR  2000   LAPAROTOMY N/A 03/20/2021   Procedure: LAPAROTOMY;  Surgeon: Clovis Riley, MD;  Location: [facility];  Service: General;  Laterality: N/A;  180   PARTIAL COLECTOMY N/A 03/20/2021   Procedure: PARTIAL COLECTOMY WITH COLOSTOMY;  Surgeon: Clovis Riley, MD;  Location: [facility];  Service: General;  Laterality: N/A;   [place]   SUPRACERVICAL ABDOMINAL HYSTERECTOMY  2003   with RSO. Leiomyoma menorrhagia with 16-18 week size uterus    Social History   Socioeconomic History   Marital status: Married    Spouse name: Not on file   Number of children: 1   Years of education: Not on file   Highest education level: Not on file  Occupational History   Occupation: retired  Tobacco Use   Smoking status: Never   Smokeless tobacco: Never  Vaping Use   Vaping Use: Never used  Substance and Sexual Activity   Alcohol use: Not Currently   Drug use: No  Sexual activity: Not Currently    Partners: Male    Birth control/protection: Surgical    Comment: 1st intercourse 42 yo-5 partners, hysterectomy  Other Topics Concern   Not on file  Social History Narrative   Not on file   Social Determinants of Health   Financial Resource Strain: Low Risk  (03/16/2022)   Overall  Financial Resource Strain (CARDIA)    Difficulty of Paying Living Expenses: Not hard at all  Food Insecurity: No Food Insecurity (03/16/2022)   Hunger Vital Sign    Worried About Running Out of Food in the Last Year: Never true    [place] in the Last Year: Never true  Transportation Needs: No Transportation Needs (03/16/2022)   PRAPARE - Hydrologist (Medical): No    Lack of Transportation (Non-Medical): No  Physical Activity: Inactive (03/16/2022)   Exercise Vital Sign    Days of Exercise per Week: 0 days    Minutes of Exercise per Session: 0 min  Stress: No Stress Concern Present (03/16/2022)   [place]    Feeling of Stress : Not at all  Social Connections: [place] (03/16/2022)   Social Connection and Isolation Panel [NHANES]    Frequency of Communication with Friends and Family: More than three times a week    Frequency of Social Gatherings with Friends and Family: Once a week    Attends Religious Services: More than 4 times per year    Active Member of [employer] or Organizations: Yes    Attends Music therapist: More than 4 times per year    Marital Status: Married  Human resources officer Violence: Not At Risk (03/16/2022)   Humiliation, Afraid, Rape, and Kick questionnaire    Fear of Current or Ex-Partner: No    Emotionally Abused: No    Physically Abused: No    Sexually Abused: No    Family History  Problem Relation Age of Onset   Heart disease Mother 79   Cancer Mother 86       neck ca    Stroke Father 92   Other Brother        train accident   Other Brother        respiratory   Other Brother        liver failure ?   Breast cancer Maternal Grandmother        untreated   Other Daughter        suicide   Pancreatic cancer Neg Hx    Rectal cancer Neg Hx    Stomach cancer Neg Hx     Medications Prior to Admission  Medication Sig Dispense Refill Last  Dose   atorvastatin (LIPITOR) 10 MG tablet Take 1 tablet (10 mg total) by mouth daily. Annual appt due in Nov must see provider for future refills 90 tablet 1    CALCIUM PO Take 1 tablet by mouth daily.      Cholecalciferol (VITAMIN D-3) 1000 UNITS CAPS Take 1,000 Units by mouth daily.      Cyanocobalamin ([employer] VITAMIN B12 PO) Take 1 tablet by mouth daily.      hydrochlorothiazide (HYDRODIURIL) 12.5 MG tablet TAKE 1 TABLET(12.5 MG) BY MOUTH DAILY 90 tablet 1    losartan (COZAAR) 100 MG tablet Take 1 tablet (100 mg total) by mouth daily. Annual appt due in Nov must see provider for future refills 90 tablet 1    montelukast (SINGULAIR)  10 MG tablet TAKE 1 TABLET BY MOUTH EVERY DAY 90 tablet 3    Multiple Vitamins-Minerals (CENTRUM ADULTS) TABS Take 1 tablet by mouth daily.      naproxen sodium (ANAPROX) 220 MG tablet Take 220 mg by mouth as needed (headache or pain).      Omega-3 Fatty Acids (FISH OIL PO) Take 1 capsule by mouth daily.      pantoprazole (PROTONIX) 40 MG tablet Take 1 tablet (40 mg total) by mouth daily. 90 tablet 1    triamcinolone ointment (KENALOG) 0.5 % apply to affected area twice a day (Patient taking differently: Apply 1 application. topically once as needed (itching).) 60 g 3    b complex vitamins tablet Take 1 tablet by mouth daily. (Patient not taking: Reported on 03/18/2022) 100 tablet 3 Not Taking    Current Facility-Administered Medications  Medication Dose Route Frequency Provider Last Rate Last Admin   acetaminophen (TYLENOL) tablet 1,000 mg  1,000 mg Oral On Call to OR Michael Boston, MD       alvimopan (ENTEREG) capsule 12 mg  12 mg Oral On Call to OR Michael Boston, MD       bisacodyl (DULCOLAX) EC tablet 20 mg  20 mg Oral Once Michael Boston, MD       bupivacaine liposome (EXPAREL) 1.3 % injection 266 mg  20 mL Infiltration Once Michael Boston, MD       cefoTEtan (CEFOTAN) 2 g in sodium chloride 0.9 % 100 mL IVPB  2 g Intravenous On Call to OR Michael Boston, MD        chlorhexidine (PERIDEX) 0.12 % solution 15 mL  15 mL Mouth/Throat Once Nolon Nations, MD       Or   MEDLINE mouth rinse  15 mL Mouth Rinse Once Nolon Nations, MD       Chlorhexidine Gluconate Cloth 2 % PADS 6 each  6 each Topical Once Michael Boston, MD       And   Chlorhexidine Gluconate Cloth 2 % PADS 6 each  6 each Topical Once Michael Boston, MD       enoxaparin (LOVENOX) injection 40 mg  40 mg Subcutaneous Once Michael Boston, MD       feeding supplement (ENSURE PRE-SURGERY) liquid 296 mL  296 mL Oral Once Michael Boston, MD       feeding supplement (ENSURE PRE-SURGERY) liquid 592 mL  592 mL Oral Once Michael Boston, MD       gabapentin (NEURONTIN) capsule 300 mg  300 mg Oral On Call to OR Michael Boston, MD       lactated ringers infusion   Intravenous Continuous Nolon Nations, MD       neomycin [facility]) tablet 1,000 mg  1,000 mg Oral 3 times per day Michael Boston, MD       And   metroNIDAZOLE (FLAGYL) tablet 1,000 mg  1,000 mg Oral 3 times per day Michael Boston, MD       polyethylene glycol powder (GLYCOLAX/MIRALAX) container 255 g  1 Container Oral Once Michael Boston, MD         Allergies  Allergen Reactions   Lisinopril Cough   Phentermine Other (See Comments)    Raynauld's ? Tachycardia    Ht 4' 10.25" (1.48 m)   Wt 79.8 kg   BMI 36.45 kg/m   Labs: No results found for this or any previous visit (from the past 48 hour(s)).  Imaging / Studies: No results found.   Adin Hector, M.D.,  F.A.C.S. Gastrointestinal and Minimally Invasive Surgery [facility] [address], [place], [place] [address] [phone] Main / Paging  04/01/2022 8:03 AM    Adin Hector

## 2022-04-01 NOTE — Anesthesia Preprocedure Evaluation (Signed)
Anesthesia Evaluation  Patient identified by MRN, date of birth, ID band Patient awake    Reviewed: Allergy & Precautions, NPO status , Patient's Chart, lab work & pertinent test results  Airway Mallampati: II  TM Distance: >3 FB Neck ROM: Full    Dental no notable dental hx.    Pulmonary neg pulmonary ROS,    Pulmonary exam normal        Cardiovascular hypertension, Pt. on medications  Rhythm:Regular Rate:Normal     Neuro/Psych negative neurological ROS  negative psych ROS   GI/Hepatic Neg liver ROS, GERD  Medicated,  Endo/Other  negative endocrine ROS  Renal/GU negative Renal ROS  negative genitourinary   Musculoskeletal negative musculoskeletal ROS (+)   Abdominal Normal abdominal exam  (+)   Peds  Hematology negative hematology ROS (+)   Anesthesia Other Findings   Reproductive/Obstetrics                             Anesthesia Physical Anesthesia Plan  ASA: 2  Anesthesia Plan: General   Post-op Pain Management: Tylenol PO (pre-op)* and Gabapentin PO (pre-op)*   Induction: Intravenous  PONV Risk Score and Plan: 3 and Ondansetron, Dexamethasone and Treatment may vary due to age or medical condition  Airway Management Planned: Mask and Oral ETT  Additional Equipment: None  Intra-op Plan:   Post-operative Plan: Extubation in OR  Informed Consent: I have reviewed the patients History and Physical, chart, labs and discussed the procedure including the risks, benefits and alternatives for the proposed anesthesia with the patient or authorized representative who has indicated his/her understanding and acceptance.     Dental advisory given  Plan Discussed with: CRNA  Anesthesia Plan Comments: (Lab Results      Component                Value               Date                      WBC                      11.3 (H)            03/22/2022                HGB                       10.5 (L)            03/22/2022                HCT                      32.8 (L)            03/22/2022                MCV                      79.4 (L)            03/22/2022                PLT                      450 (H)  03/22/2022           Lab Results      Component                Value               Date                      NA                       140                 03/22/2022                K                        4.3                 03/22/2022                CO2                      26                  03/22/2022                GLUCOSE                  107 (H)             03/22/2022                BUN                      22                  03/22/2022                CREATININE               1.17 (H)            03/22/2022                CALCIUM                  10.0                03/22/2022                GFRNONAA                 50 (L)              03/22/2022          )        Anesthesia Quick Evaluation

## 2022-04-01 NOTE — Discharge Instructions (Signed)
SURGERY: POST OP INSTRUCTIONS (Surgery for small bowel obstruction, colon resection, etc)   ######################################################################  EAT Gradually transition to a high fiber diet with a fiber supplement over the next few days after discharge  WALK Walk an hour a day.  Control your pain to do that.    CONTROL PAIN Control pain so that you can walk, sleep, tolerate sneezing/coughing, go up/down stairs.  HAVE A BOWEL MOVEMENT DAILY Keep your bowels regular to avoid problems.  OK to try a laxative to override constipation.  OK to use an antidairrheal to slow down diarrhea.  Call if not better after 2 tries  CALL IF YOU HAVE PROBLEMS/CONCERNS Call if you are still struggling despite following these instructions. Call if you have concerns not answered by these instructions  ######################################################################   DIET Follow a light diet the first few days at home.  Start with a bland diet such as soups, liquids, starchy foods, low fat foods, etc.  If you feel full, bloated, or constipated, stay on a ful liquid or pureed/blenderized diet for a few days until you feel better and no longer constipated. Be sure to drink plenty of fluids every day to avoid getting dehydrated (feeling dizzy, not urinating, etc.). Gradually add a fiber supplement to your diet over the next week.  Gradually get back to a regular solid diet.  Avoid fast food or heavy meals the first week as you are more likely to get nauseated. It is expected for your digestive tract to need a few months to get back to normal.  It is common for your bowel movements and stools to be irregular.  You will have occasional bloating and cramping that should eventually fade away.  Until you are eating solid food normally, off all pain medications, and back to regular activities; your bowels will not be normal. Focus on eating a low-fat, high fiber diet the rest of your life  (See Getting to Good Bowel Health, below).  CARE of your INCISION or WOUND  It is good for closed incisions and even open wounds to be washed every day.  Shower every day.  Short baths are fine.  Wash the incisions and wounds clean with soap & water.    You may leave closed incisions open to air if it is dry.   You may cover the incision with clean gauze & replace it after your daily shower for comfort.  TEGADERM & WICKS:  You have clear gauze band-aid dressings over your closed incision(s).  Remove the dressings & shoelace ribbon wicks in your largest incision 3 days after surgery.    If you have an open wound with a wound vac, see wound vac care instructions.    ACTIVITIES as tolerated Start light daily activities --- self-care, walking, climbing stairs-- beginning the day after surgery.  Gradually increase activities as tolerated.  Control your pain to be active.  Stop when you are tired.  Ideally, walk several times a day, eventually an hour a day.   Most people are back to most day-to-day activities in a few weeks.  It takes 4-8 weeks to get back to unrestricted, intense activity. If you can walk 30 minutes without difficulty, it is safe to try more intense activity such as jogging, treadmill, bicycling, low-impact aerobics, swimming, etc. Save the most intensive and strenuous activity for last (Usually 4-8 weeks after surgery) such as sit-ups, heavy lifting, contact sports, etc.  Refrain from any intense heavy lifting or straining until you are off narcotics   for pain control.  You will have off days, but things should improve week-by-week. DO NOT PUSH THROUGH PAIN.  Let pain be your guide: If it hurts to do something, don't do it.  Pain is your body warning you to avoid that activity for another week until the pain goes down. You may drive when you are no longer taking narcotic prescription pain medication, you can comfortably wear a seatbelt, and you can safely make sudden turns/stops to  protect yourself without hesitating due to pain. You may have sexual intercourse when it is comfortable. If it hurts to do something, stop.  MEDICATIONS Take your usually prescribed home medications unless otherwise directed.   Blood thinners:  Usually you can restart any strong blood thinners after the second postoperative day.  It is OK to take aspirin right away.     If you are on strong blood thinners (warfarin/Coumadin, Plavix, Xerelto, Eliquis, Pradaxa, etc), discuss with your surgeon, medicine PCP, and/or cardiologist for instructions on when to restart the blood thinner & if blood monitoring is needed (PT/INR blood check, etc).     PAIN CONTROL Pain after surgery or related to activity is often due to strain/injury to muscle, tendon, nerves and/or incisions.  This pain is usually short-term and will improve in a few months.  To help speed the process of healing and to get back to regular activity more quickly, DO THE FOLLOWING THINGS TOGETHER: Increase activity gradually.  DO NOT PUSH THROUGH PAIN Use Ice and/or Heat Try Gentle Massage and/or Stretching Take over the counter pain medication Take Narcotic prescription pain medication for more severe pain  Good pain control = faster recovery.  It is better to take more medicine to be more active than to stay in bed all day to avoid medications.  Increase activity gradually Avoid heavy lifting at first, then increase to lifting as tolerated over the next 6 weeks. Do not "push through" the pain.  Listen to your body and avoid positions and maneuvers than reproduce the pain.  Wait a few days before trying something more intense Walking an hour a day is encouraged to help your body recover faster and more safely.  Start slowly and stop when getting sore.  If you can walk 30 minutes without stopping or pain, you can try more intense activity (running, jogging, aerobics, cycling, swimming, treadmill, sex, sports, weightlifting,  etc.) Remember: If it hurts to do it, then don't do it! Use Ice and/or Heat You will have swelling and bruising around the incisions.  This will take several weeks to resolve. Ice packs or heating pads (6-8 times a day, 30-60 minutes at a time) will help sooth soreness & bruising. Some people prefer to use ice alone, heat alone, or alternate between ice & heat.  Experiment and see what works best for you.  Consider trying ice for the first few days to help decrease swelling and bruising; then, switch to heat to help relax sore spots and speed recovery. Shower every day.  Short baths are fine.  It feels good!  Keep the incisions and wounds clean with soap & water.   Try Gentle Massage and/or Stretching Massage at the area of pain many times a day Stop if you feel pain - do not overdo it Take over the counter pain medication This helps the muscle and nerve tissues become less irritable and calm down faster Choose ONE of the following over-the-counter anti-inflammatory medications: Acetaminophen 500mg tabs (Tylenol) 1-2 pills with every meal and   just before bedtime (avoid if you have liver problems or if you have acetaminophen in you narcotic prescription) Naproxen 220mg tabs (ex. Aleve, Naprosyn) 1-2 pills twice a day (avoid if you have kidney, stomach, IBD, or bleeding problems) Ibuprofen 200mg tabs (ex. Advil, Motrin) 3-4 pills with every meal and just before bedtime (avoid if you have kidney, stomach, IBD, or bleeding problems) Take with food/snack several times a day as directed for at least 2 weeks to help keep pain / soreness down & more manageable. Take Narcotic prescription pain medication for more severe pain A prescription for strong pain control is often given to you upon discharge (for example: oxycodone/Percocet, hydrocodone/Norco/Vicodin, or tramadol/Ultram) Take your pain medication as prescribed. Be mindful that most narcotic prescriptions contain Tylenol (acetaminophen) as well -  avoid taking too much Tylenol. If you are having problems/concerns with the prescription medicine (does not control pain, nausea, vomiting, rash, itching, etc.), please call us [phone] to see if we need to switch you to a different pain medicine that will work better for you and/or control your side effects better. If you need a refill on your pain medication, you must call the office before 4 pm and on weekdays only.  By federal law, prescriptions for narcotics cannot be called into a pharmacy.  They must be filled out on paper & picked up from our office by the patient or authorized caretaker.  Prescriptions cannot be filled after 4 pm nor on weekends.    WHEN TO CALL US [phone] Severe uncontrolled or worsening pain  Fever over 101 F (38.5 C) Concerns with the incision: Worsening pain, redness, rash/hives, swelling, bleeding, or drainage Reactions / problems with new medications (itching, rash, hives, nausea, etc.) Nausea and/or vomiting Difficulty urinating Difficulty breathing Worsening fatigue, dizziness, lightheadedness, blurred vision Other concerns If you are not getting better after two weeks or are noticing you are getting worse, contact our office [phone] for further advice.  We may need to adjust your medications, re-evaluate you in the office, send you to the emergency room, or see what other things we can do to help. The clinic staff is available to answer your questions during regular business hours (8:30am-5pm).  Please don't hesitate to call and ask to speak to one of our nurses for clinical concerns.    A surgeon from [facility] is always on call at the hospitals 24 hours/day If you have a medical emergency, go to the nearest emergency room or call 911.  FOLLOW UP in our office One the day of your discharge from the hospital (or the next business weekday), please call [facility] to set up or confirm an appointment to see your  surgeon in the office for a follow-up appointment.  Usually it is 2-3 weeks after your surgery.   If you have skin staples at your incision(s), let the office know so we can set up a time in the office for the nurse to remove them (usually around 10 days after surgery). Make sure that you call for appointments the day of discharge (or the next business weekday) from the hospital to ensure a convenient appointment time. IF YOU HAVE DISABILITY OR FAMILY LEAVE FORMS, BRING THEM TO THE OFFICE FOR PROCESSING.  DO NOT GIVE THEM TO YOUR DOCTOR.  [facility] [address], [address], [place], [place]  [address] ? [phone] - Main [phone] - Toll Free,  [phone] - Fax [email or web address]      GETTING TO GOOD BOWEL HEALTH. It is expected for your digestive tract to need a few months to get back to normal.  It is common for your bowel movements and stools to be irregular.  You will have occasional bloating and cramping that should eventually fade away.  Until you are eating solid food normally, off all pain medications, and back to regular activities; your bowels will not be normal.   Avoiding constipation The goal: ONE SOFT BOWEL MOVEMENT A DAY!    Drink plenty of fluids.  Choose water first. TAKE A FIBER SUPPLEMENT EVERY DAY THE REST OF YOUR LIFE During your first week back home, gradually add back a fiber supplement every day Experiment which form you can tolerate.   There are many forms such as powders, tablets, wafers, gummies, etc Psyllium bran (Metamucil), methylcellulose (Citrucel), Miralax or Glycolax, Benefiber, Flax Seed.  Adjust the dose week-by-week (1/2 dose/day to 6 doses a day) until you are moving your bowels 1-2 times a day.  Cut back the dose or try a different fiber product if it is giving you problems such as diarrhea or bloating. Sometimes a laxative is needed to help jump-start bowels if constipated until the fiber supplement can help  regulate your bowels.  If you are tolerating eating & you are farting, it is okay to try a gentle laxative such as double dose MiraLax, prune juice, or Milk of Magnesia.  Avoid using laxatives too often. Stool softeners can sometimes help counteract the constipating effects of narcotic pain medicines.  It can also cause diarrhea, so avoid using for too long. If you are still constipated despite taking fiber daily, eating solids, and a few doses of laxatives, call our office. Controlling diarrhea Try drinking liquids and eating bland foods for a few days to avoid stressing your intestines further. Avoid dairy products (especially milk & ice cream) for a short time.  The intestines often can lose the ability to digest lactose when stressed. Avoid foods that cause gassiness or bloating.  Typical foods include beans and other legumes, cabbage, broccoli, and dairy foods.  Avoid greasy, spicy, fast foods.  Every person has some sensitivity to other foods, so listen to your body and avoid those foods that trigger problems for you. Probiotics (such as active yogurt, Align, etc) may help repopulate the intestines and colon with normal bacteria and calm down a sensitive digestive tract Adding a fiber supplement gradually can help thicken stools by absorbing excess fluid and retrain the intestines to act more normally.  Slowly increase the dose over a few weeks.  Too much fiber too soon can backfire and cause cramping & bloating. It is okay to try and slow down diarrhea with a few doses of antidiarrheal medicines.   Bismuth subsalicylate (ex. Kayopectate, Pepto Bismol) for a few doses can help control diarrhea.  Avoid if pregnant.   Loperamide (Imodium) can slow down diarrhea.  Start with one tablet (2mg) first.  Avoid if you are having fevers or severe pain.  ILEOSTOMY PATIENTS WILL HAVE CHRONIC DIARRHEA since their colon is not in use.    Drink plenty of liquids.  You will need to drink even more glasses of  water/liquid a day to avoid getting dehydrated. Record output from your ileostomy.  Expect to empty the bag every 3-4 hours at first.  Most people with a permanent ileostomy empty their bag 4-6 times at the least.   Use antidiarrheal medicine (especially Imodium) several times a day to avoid getting dehydrated.    Start with a dose at bedtime & breakfast.  Adjust up or down as needed.  Increase antidiarrheal medications as directed to avoid emptying the bag more than 8 times a day (every 3 hours). Work with your wound ostomy nurse to learn care for your ostomy.  See ostomy care instructions. TROUBLESHOOTING IRREGULAR BOWELS 1) Start with a soft & bland diet. No spicy, greasy, or fried foods.  2) Avoid gluten/wheat or dairy products from diet to see if symptoms improve. 3) Miralax 17gm or flax seed mixed in 8oz. water or juice-daily. May use 2-4 times a day as needed. 4) Gas-X, Phazyme, etc. as needed for gas & bloating.  5) Prilosec (omeprazole) over-the-counter as needed 6)  Consider probiotics (Align, Activa, etc) to help calm the bowels down  Call your doctor if you are getting worse or not getting better.  Sometimes further testing (cultures, endoscopy, X-ray studies, CT scans, bloodwork, etc.) may be needed to help diagnose and treat the cause of the diarrhea. [facility] [address], [address], [place], [place]  [address] [phone] - Main.    [phone]  - Toll Free.   [phone] - Fax [email or web address]  

## 2022-04-01 NOTE — Consult Note (Signed)
I was asked to do cystoscopy and inject firefly prior to her colon surgery.     I have reviewed her medical and surgical history and pertinent labs and imaging.    I have described the procedure and reviewed the risks in detail.   She is agreeable to proceed.

## 2022-04-01 NOTE — Op Note (Signed)
Procedure: Cystoscopy with bilateral ureteral catheterization and injection of firefly.  Preop diagnosis: Possible colonic fistula.  Postop diagnosis: Same.  Surgeon: Dr. Irine Seal.  Anesthesia: General.  Specimen: None.  Drain: Foley catheter.  EBL: None.  Complications: None  Indications: Regina Roberts is a 71 year old female who is to undergo colon surgery by Dr. Johney Maine and he requested firefly for ureteral identification.  Procedure: She was taken operating room where general anesthetic was induced.  She was given antibiotics per general surgery.  She was placed in the lithotomy position and fitted with PAS hose.  Her perineum and genitalia were prepped with Betadine solution she was draped in usual sterile fashion.  Cystoscopy was performed using a 21 [place] scope and 30 degree lens.  Examination revealed a normal urethra.  The bladder wall was smooth and pale without tumors, stones or inflammation.  Ureteral orifices were unremarkable.  The left ureteral orifice was cannulated with a 5 [place] open-ended catheter and 7 mL of firefly solution was gently instilled into the ureter without difficulty.  The right ureteral orifice was cannulated with a 5 [place] open-ended catheter and 7 mL of firefly solution was gently instilled into the ureter without difficulty.  The cystoscope was removed and a 14 French silicone Foley was placed.  The balloon was filled with 10 mL of sterile fluid and the catheter was placed to straight drainage.  Procedure was then turned over to Dr. Johney Maine.  There were no complications during my portion.

## 2022-04-01 NOTE — Progress Notes (Signed)
Patient c/o iv site being sore.  Site is free of redness and swelling.  Flushes well.  #18 to right hand.  Gave patient a warm compress to place over site for comfort. Patient states "that's feels better."

## 2022-04-02 ENCOUNTER — Encounter (HOSPITAL_COMMUNITY): Payer: Self-pay | Admitting: Surgery

## 2022-04-02 LAB — BASIC METABOLIC PANEL
Anion gap: 8 (ref 5–15)
BUN: 13 mg/dL (ref 8–23)
CO2: 26 mmol/L (ref 22–32)
Calcium: 8.7 mg/dL — ABNORMAL LOW (ref 8.9–10.3)
Chloride: 106 mmol/L (ref 98–111)
Creatinine, Ser: 1.07 mg/dL — ABNORMAL HIGH (ref 0.44–1.00)
GFR, Estimated: 56 mL/min — ABNORMAL LOW (ref >60)
Glucose, Bld: 136 mg/dL — ABNORMAL HIGH (ref 70–99)
Potassium: 3.5 mmol/L (ref 3.5–5.1)
Sodium: 140 mmol/L (ref 135–145)

## 2022-04-02 LAB — CBC
HCT: 26.1 % — ABNORMAL LOW (ref 36.0–46.0)
Hemoglobin: 8.4 g/dL — ABNORMAL LOW (ref 12.0–15.0)
MCH: 25.5 pg — ABNORMAL LOW (ref 26.0–34.0)
MCHC: 32.2 g/dL (ref 30.0–36.0)
MCV: 79.1 fL — ABNORMAL LOW (ref 80.0–100.0)
Platelets: 374 10*3/uL (ref 150–400)
RBC: 3.3 MIL/uL — ABNORMAL LOW (ref 3.87–5.11)
RDW: 17.2 % — ABNORMAL HIGH (ref 11.5–15.5)
WBC: 21.4 10*3/uL — ABNORMAL HIGH (ref 4.0–10.5)
nRBC: 0 % (ref 0.0–0.2)

## 2022-04-02 LAB — MAGNESIUM: Magnesium: 1.7 mg/dL (ref 1.7–2.4)

## 2022-04-02 MED ORDER — GABAPENTIN 100 MG PO CAPS
200.0000 mg | ORAL_CAPSULE | Freq: Every day | ORAL | Status: DC
  Administered 2022-04-02: 200 mg via ORAL
  Filled 2022-04-02: qty 2

## 2022-04-02 NOTE — Progress Notes (Signed)
Regina Roberts [id number] 16-Nov-1950  CARE TEAM:  PCP: Cassandria Anger, MD  Outpatient Care Team: Patient Care Team: Plotnikov, Evie Lacks, MD as PCP - General Fontaine, Belinda Block, MD (Inactive) as Consulting Physician (Gynecology) Pyrtle, Lajuan Lines, MD as Consulting Physician (Gastroenterology) Clovis Riley, MD as Consulting Physician (General Surgery) Rutherford Guys, MD as Consulting Physician (Ophthalmology) Michael Boston, MD as Consulting Physician (Colon and Rectal Surgery)  Inpatient Treatment Team: Treatment Team: Attending Provider: Michael Boston, MD; Charge Nurse: Heloise Ochoa, RN; Utilization Review: Alease Medina, RN; Registered Nurse: Alva Garnet, RN   Problem List:   Principal Problem:   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 Active Problems:   Dyslipidemia   Essential hypertension   Obesity, Class II, BMI 35-39.9   Raynaud phenomenon   Allergic rhinitis   GERD (gastroesophageal reflux disease)   History of diverticulitis of colon   CKD (chronic kidney disease) stage 2, GFR 60-89 ml/min   1 Day Post-Op  04/01/2022  POST-OPERATIVE DIAGNOSIS:   HISTORY OF PERFORATED DIVERTICULITIS S/P HARTMANN RESECTION (COLECTOMY/COLOSTOMY) CECUM-to RECTAL STUMP FISTULA INCOMPLETE RECTOSIGMOID STUMP-to-VAGINAL CUFF FISTULA DESIRE FOR OSTOMY TAKEDOWN   PROCEDURE:   ROBOTIC COLOSTOMY TAKEDOWN, LOW ANTERIOR RECTOSIGMOID RESECTION TAKEDOWN OF CECUM-to RECTAL STUMP FISTULA TAKEDOWN OF RECTOSIGMOID STUMP-to-VAGINAL CUFF FISTULA  ROBOTIC LYSIS OF ADHESIONS x 2.5 hours ILEAL REPAIR RIGID PROCTOSCOPY TRANSVERSUS ABDOMINIS PLANE (TAP) BLOCK - BILATERAL   SURGEON:  Michael Boston, MD, FACS  OR FINDINGS:  Patient had moderately dense adhesions of omentum to anterior wall as well as small bowel adhesions and specially down the pelvis.  Patient had very dense adhesions between the proximal right corner of the rectosigmoid stump to the posterior cecal wall that  correlated with the cecum to rectosigmoid stump fistula.  Patient had rectosigmoid densely adherent to the vaginal cuff with a pocket suspicious for either nearly resolved abscess or possible and complete fistula from rectal stump to the vaginal cuff.  Both areas repaired with absorbable serrated V-Loc suture in a running fashion on the cecum and vaginal cuff.  Rectosigmoid resection done of the 2 potential fistulous connections.     It is a and descending colon to and proximal rectum 29 EEA stapled anastomosis.  Anastomosis rests 12 cm from the anal verge.    Assessment  OK  [facility] Stay = 1 days)  Plan:  -ERAS pathway -Stop IV fluid -Foley catheter out -Follow-up on pathology -Improved pain control. -VTE prophylaxis- SCDs, enoxaparin, etc. -mobilize as tolerated to help recovery.  Fact she is already walking the hallways is a good start.  Disposition:  Disposition:  The patient is from: Home  Anticipate discharge to:  Home  Anticipated Date of Discharge is:  June 12,2023    Barriers to discharge:  Pending Clinical improvement (more likely than not)  Patient currently is NOT MEDICALLY STABLE for discharge from the hospital from a surgery standpoint.      I reviewed nursing notes, last 24 h vitals and pain scores, last 48 h intake and output, last 24 h labs and trends, and last 24 h imaging results. I have reviewed this patient's available data, including medical history, events of note, test results, etc as part of my evaluation.  A significant portion of that time was spent in counseling.  Care during the described time interval was provided by me.  This care required moderate level of medical decision making.  04/02/2022    Subjective: (Chief complaint)  Walked in hallways yesterday.  Feeling sore.  Not asking for pain meds beyond Tylenol.  Not much appetite but tolerating liquids.  No nausea or vomiting.  Objective:  Vital signs:  Vitals:   04/01/22 2111  04/01/22 2149 04/02/22 0154 04/02/22 0454  BP: 119/65 121/69 124/64 117/64  Pulse: 89 88 92 100  Resp: '16 16 16 18  '$ Temp: 97.6 F (36.4 C) 98.4 F (36.9 C) 98.6 F (37 C) 98.7 F (37.1 C)  TempSrc: Oral Oral Oral Oral  SpO2: 98% 100% 97% 97%  Weight:      Height:        Last BM Date : 03/31/22  Intake/Output   Yesterday:  06/09 0701 - 06/10 0700 In: 3574.4 [P.O.:480; I.V.:2644.4; IV Piggyback:450] Out: 2650 [Urine:2100; Drains:150; Blood:400] This shift:  No intake/output data recorded.  Bowel function:  Flatus: No  BM:  No  Drain: Serosanguinous   Physical Exam:  General: Pt awake/alert in mild acute distress Eyes: PERRL, normal EOM.  Sclera clear.  No icterus Neuro: CN II-XII intact w/o focal sensory/motor deficits. Lymph: No head/neck/groin lymphadenopathy Psych:  No delerium/psychosis/paranoia.  Oriented x 4 HENT: Normocephalic, Mucus membranes moist.  No thrush Neck: Supple, No tracheal deviation.  No obvious thyromegaly Chest: No pain to chest wall compression.  Good respiratory excursion.  No audible wheezing CV:  Pulses intact.  Regular rhythm.  No major extremity edema MS: Normal AROM mjr joints.  No obvious deformity  Abdomen: Soft.  Mildy distended.  Tenderness at LUQ flank lateral to colostomy closure. Old blood around drain and colostomy site.  No evidence of peritonitis.  No incarcerated hernias.  Ext:   No deformity.  No mjr edema.  No cyanosis Skin: No petechiae / purpurea.  No major sores.  Warm and dry    Results:   Cultures: No results found for this or any previous visit (from the past 720 hour(s)).  Labs: Results for orders placed or performed during the hospital encounter of 04/01/22 (from the past 48 hour(s))  Basic metabolic panel     Status: Abnormal   Collection Time: 04/02/22  5:17 AM  Result Value Ref Range   Sodium 140 135 - 145 mmol/L   Potassium 3.5 3.5 - 5.1 mmol/L   Chloride 106 98 - 111 mmol/L   CO2 26 22 - 32 mmol/L    Glucose, Bld 136 (H) 70 - 99 mg/dL    Comment: Glucose reference range applies only to samples taken after fasting for at least 8 hours.   BUN 13 8 - 23 mg/dL   Creatinine, Ser 1.07 (H) 0.44 - 1.00 mg/dL   Calcium 8.7 (L) 8.9 - 10.3 mg/dL   GFR, Estimated 56 (L) >60 mL/min    Comment: (NOTE) Calculated using the CKD-EPI Creatinine Equation (2021)    Anion gap 8 5 - 15    Comment: Performed at [facility], [place] [address], [place], [place] [address]  CBC     Status: Abnormal   Collection Time: 04/02/22  5:17 AM  Result Value Ref Range   WBC 21.4 (H) 4.0 - 10.5 K/uL   RBC 3.30 (L) 3.87 - 5.11 MIL/uL   Hemoglobin 8.4 (L) 12.0 - 15.0 g/dL   HCT 26.1 (L) 36.0 - 46.0 %   MCV 79.1 (L) 80.0 - 100.0 fL   MCH 25.5 (L) 26.0 - 34.0 pg   MCHC 32.2 30.0 - 36.0 g/dL   RDW 17.2 (H) 11.5 - 15.5 %   Platelets 374 150 - 400 K/uL   nRBC  0.0 0.0 - 0.2 %    Comment: Performed at [facility], [place] [address], [place], [place] [address]  Magnesium     Status: None   Collection Time: 04/02/22  5:17 AM  Result Value Ref Range   Magnesium 1.7 1.7 - 2.4 mg/dL    Comment: Performed at [facility], [place] [address], [place], [place] [address]    Imaging / Studies: No results found.  Medications / Allergies: per chart  Antibiotics: Anti-infectives (From admission, onward)    Start     Dose/Rate Route Frequency Ordered Stop   04/01/22 2200  cefoTEtan (CEFOTAN) 2 g in sodium chloride 0.9 % 100 mL IVPB        2 g 200 mL/hr over 30 Minutes Intravenous Every 12 hours 04/01/22 1714 04/01/22 2316   04/01/22 1400  neomycin (MYCIFRADIN) tablet 1,000 mg  Status:  Discontinued       See Hyperspace for full Linked Orders Report.   1,000 mg Oral 3 times per day 04/01/22 0756 04/01/22 0807   04/01/22 1400  metroNIDAZOLE (FLAGYL) tablet 1,000 mg  Status:  Discontinued       See Hyperspace for full Linked Orders Report.   1,000 mg Oral 3 times per day  04/01/22 0756 04/01/22 0807   04/01/22 0801  sodium chloride 0.9 % with cefoTEtan (CEFOTAN) ADS Med       Note to Pharmacy: Kyra Leyland E: cabinet override      04/01/22 0801 04/01/22 1015   04/01/22 0800  cefoTEtan (CEFOTAN) 2 g in sodium chloride 0.9 % 100 mL IVPB        2 g 200 mL/hr over 30 Minutes Intravenous On call to O.R. 04/01/22 0756 04/01/22 1030         Note: Portions of this report may have been transcribed using voice recognition software. Every effort was made to ensure accuracy; however, inadvertent computerized transcription errors may be present.   Any transcriptional errors that result from this process are unintentional.    Adin Hector, MD, FACS, MASCRS Esophageal, Gastrointestinal & Colorectal Surgery Robotic and Minimally Invasive Surgery  [facility], [place]  [place]. [address], [place],  [address] [phone] Fax [phone] Main  CONTACT INFORMATION:  Weekday (9AM-5PM): Call [facility] main office at [phone]  Weeknight (5PM-9AM) or Weekend/Holiday: Check [email or web address] (password " [email or web address]") for General Surgery [facility] coverage  (Please, do not use SecureChat as it is not reliable communication to operating surgeons for immediate patient care)      04/02/2022  7:39 AM

## 2022-04-02 NOTE — TOC Initial Note (Signed)
Transition of Care [facility]) - Initial/Assessment Note    Patient Details  Name: Regina Roberts MRN: [id number] Date of Birth: May 02, 1951  Transition of [facility]) CM/SW Contact:    Leeroy Cha, RN Phone Number: 04/02/2022, 8:38 AM  Clinical Narrative:                 S/p or procedures will follow for toc needs.  Expected Discharge Plan: Home/Self Care Barriers to Discharge: Continued Medical Work up   Patient Goals and CMS Choice Patient states their goals for this hospitalization and ongoing recovery are:: to go home CMS Medicare.gov Compare Post Acute Care list provided to:: Patient    Expected Discharge Plan and Services Expected Discharge Plan: Home/Self Care   Discharge Planning Services: CM Consult   Living arrangements for the past 2 months: Single Family Home Expected Discharge Date: 04/01/22                                    Prior Living Arrangements/Services Living arrangements for the past 2 months: Single Family Home Lives with:: Spouse Patient language and need for interpreter reviewed:: Yes Do you feel safe going back to the place where you live?: Yes            Criminal Activity/Legal Involvement Pertinent to Current Situation/Hospitalization: No - Comment as needed  Activities of Daily Living Home Assistive Devices/Equipment: None ADL Screening (condition at time of admission) Patient's cognitive ability adequate to safely complete daily activities?: Yes Is the patient deaf or have difficulty hearing?: No Does the patient have difficulty seeing, even when wearing glasses/contacts?: No Does the patient have difficulty concentrating, remembering, or making decisions?: No Patient able to express need for assistance with ADLs?: Yes Does the patient have difficulty dressing or bathing?: No Independently performs ADLs?: Yes (appropriate for developmental age) Does the patient have difficulty walking or climbing stairs?: No Weakness  of Legs: None Weakness of Arms/Hands: None  Permission Sought/Granted                  Emotional Assessment Appearance:: Appears stated age     Orientation: : Oriented to Self, Oriented to Place, Oriented to  Time, Oriented to Situation Alcohol / Substance Use: Not Applicable Psych Involvement: No (comment)  Admission diagnosis:  Colonic fistula [K63.2] Patient Active Problem List   Diagnosis Date Noted   History of diverticulitis of colon 04/01/2022   CKD (chronic kidney disease) stage 2, GFR 60-89 ml/min 04/01/2022   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 02/28/2022   Colostomy in place [facility]) 09/08/2021   Frequency of urination 03/08/2021   Decreased GFR 09/10/2020   Neoplasm of uncertain behavior of skin 09/07/2020   Well adult exam 09/07/2020   Hyperglycemia 09/07/2020   GERD (gastroesophageal reflux disease) 02/20/2019   Tachycardia 05/21/2018   Cough 11/01/2017   Allergic rhinitis 11/01/2017   Ulnar nerve neuropathy 05/16/2017   Raynaud phenomenon 02/01/2017   Obesity, Class II, BMI 35-39.9 06/09/2015   Dizziness 03/09/2015   Swelling of both hands 09/08/2014   SHOULDER PAIN 05/18/2010   Edema 05/18/2010   GRANULOMA, INFECTIOUS 05/15/2009   Dyslipidemia 07/16/2008   MENOPAUSAL SYNDROME 07/16/2008   LOW BACK PAIN 07/16/2008   Essential hypertension 07/16/2008   PCP:  Cassandria Anger, MD Pharmacy:   [name] [place], [place] - [address] AT [place]  [address] Phone: [phone] Fax: [phone]     Social Determinants of Health (SDOH) Interventions    Readmission Risk Interventions     No data to display

## 2022-04-03 ENCOUNTER — Other Ambulatory Visit: Payer: Self-pay

## 2022-04-03 MED ORDER — LACTATED RINGERS IV BOLUS
1000.0000 mL | Freq: Once | INTRAVENOUS | Status: AC
  Administered 2022-04-03: 1000 mL via INTRAVENOUS

## 2022-04-03 MED ORDER — GABAPENTIN 100 MG PO CAPS
200.0000 mg | ORAL_CAPSULE | Freq: Three times a day (TID) | ORAL | Status: DC
  Administered 2022-04-03 (×3): 200 mg via ORAL
  Filled 2022-04-03 (×3): qty 2

## 2022-04-03 NOTE — Progress Notes (Signed)
Regina Roberts [id number] March 26, 1951  CARE TEAM:  PCP: Cassandria Anger, MD  Outpatient Care Team: Patient Care Team: Plotnikov, Evie Lacks, MD as PCP - General Fontaine, Belinda Block, MD (Inactive) as Consulting Physician (Gynecology) Pyrtle, Lajuan Lines, MD as Consulting Physician (Gastroenterology) Clovis Riley, MD as Consulting Physician (General Surgery) Rutherford Guys, MD as Consulting Physician (Ophthalmology) Michael Boston, MD as Consulting Physician (Colon and Rectal Surgery)  Inpatient Treatment Team: Treatment Team: Attending Provider: Michael Boston, MD; Technician: Abbe Amsterdam, NT; Registered Nurse: Verdie Drown, RN; Utilization Review: Alease Medina, RN; Charge Nurse: Donia Ast, RN   Problem List:   Principal Problem:   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 Active Problems:   Dyslipidemia   Essential hypertension   Obesity, Class II, BMI 35-39.9   Raynaud phenomenon   Allergic rhinitis   GERD (gastroesophageal reflux disease)   History of diverticulitis of colon   CKD (chronic kidney disease) stage 2, GFR 60-89 ml/min   2 Days Post-Op  04/01/2022  POST-OPERATIVE DIAGNOSIS:   HISTORY OF PERFORATED DIVERTICULITIS S/P HARTMANN RESECTION (COLECTOMY/COLOSTOMY) CECUM-to RECTAL STUMP FISTULA INCOMPLETE RECTOSIGMOID STUMP-to-VAGINAL CUFF FISTULA DESIRE FOR OSTOMY TAKEDOWN   PROCEDURE:   ROBOTIC COLOSTOMY TAKEDOWN, LOW ANTERIOR RECTOSIGMOID RESECTION TAKEDOWN OF CECUM-to RECTAL STUMP FISTULA TAKEDOWN OF RECTOSIGMOID STUMP-to-VAGINAL CUFF FISTULA  ROBOTIC LYSIS OF ADHESIONS x 2.5 hours ILEAL REPAIR RIGID PROCTOSCOPY TRANSVERSUS ABDOMINIS PLANE (TAP) BLOCK - BILATERAL   SURGEON:  Michael Boston, MD, FACS  OR FINDINGS:  Patient had moderately dense adhesions of omentum to anterior wall as well as small bowel adhesions and specially down the pelvis.  Patient had very dense adhesions between the proximal right corner of the rectosigmoid  stump to the posterior cecal wall that correlated with the cecum to rectosigmoid stump fistula.  Patient had rectosigmoid densely adherent to the vaginal cuff with a pocket suspicious for either nearly resolved abscess or possible and complete fistula from rectal stump to the vaginal cuff.  Both areas repaired with absorbable serrated V-Loc suture in a running fashion on the cecum and vaginal cuff.  Rectosigmoid resection done of the 2 potential fistulous connections.     It is a and descending colon to and proximal rectum 29 EEA stapled anastomosis.  Anastomosis rests 12 cm from the anal verge.    Assessment  OK  [facility] Stay = 2 days)  Plan:  -ERAS pathway -Some expected postoperative delay to return of bowel function given prolonged lyse adhesions and extensive surgery.  With some bloating, go back to dysphagia 1/full liquid diet until flatus or bowel movement. -IV fluid bolus x1.  Keep Medlock with as needed for backup. -Foley catheter out.  Strict I&O -History of chronic kidney disease.  In the setting of hypertension.  Continue angiotensin II inhibitor but at half dose.  Strict INO.  Check creatinine.  Risk of dehydration. -Follow-up on pathology -Improve pain control.  Continue scheduled Tylenol.  Make gabapentin 3 times daily.  Narcotics and muscle relaxants for breakthrough. -VTE prophylaxis- SCDs, enoxaparin, etc. -mobilize as tolerated to help recovery.  Improve pain control she can mobilize more easily to help with return of bowel function.  Disposition:  Disposition:  The patient is from: Home  Anticipate discharge to:  Home  Anticipated Date of Discharge is:  June 14,2023    Barriers to discharge:  Pending Clinical improvement (more likely than not)  Patient currently is NOT MEDICALLY STABLE for discharge from the hospital from a surgery standpoint.  I reviewed nursing notes, last 24 h vitals and pain scores, last 48 h intake and output, last 24 h labs and  trends, and last 24 h imaging results. I have reviewed this patient's available data, including medical history, events of note, test results, etc as part of my evaluation.  A significant portion of that time was spent in counseling.  Care during the described time interval was provided by me.  This care required moderate level of medical decision making.  04/03/2022    Subjective: (Chief complaint)  Still sore inside.  Pain medicines helping.  Walks some.  Denies any lightheadedness nor dizziness.  No nausea or vomiting.  Tried soft diet but felt more bloated.  Using IV Dilaudid.  Objective:  Vital signs:  Vitals:   04/02/22 0921 04/02/22 1302 04/02/22 2054 04/03/22 0521  BP: (!) 102/55 108/66 112/64 126/64  Pulse: (!) 102 (!) 107 (!) 110 (!) 110  Resp: '16 19 18 17  '$ Temp: 98.7 F (37.1 C) 98.3 F (36.8 C) 98.8 F (37.1 C) 100.1 F (37.8 C)  TempSrc: Oral Oral Oral Oral  SpO2: 94% (!) 87% (!) 81% 90%  Weight:      Height:        Last BM Date : 03/31/22  Intake/Output   Yesterday:  06/10 0701 - 06/11 0700 In: 240 [P.O.:240] Out: 21 [Urine:1; Drains:20] This shift:  No intake/output data recorded.  Bowel function:  Flatus: No  BM:  No  Drain: Serosanguinous   Physical Exam:  General: Pt awake/alert in no acute distress.  Not toxic or sickly.  Just a little tired. Eyes: PERRL, normal EOM.  Sclera clear.  No icterus Neuro: CN II-XII intact w/o focal sensory/motor deficits. Lymph: No head/neck/groin lymphadenopathy Psych:  No delerium/psychosis/paranoia.  Oriented x 4 HENT: Normocephalic, Mucus membranes moist.  No thrush Neck: Supple, No tracheal deviation.  No obvious thyromegaly Chest: No pain to chest wall compression.  Good respiratory excursion.  No audible wheezing CV:  Pulses intact.  Regular rhythm.  No major extremity edema MS: Normal AROM mjr joints.  No obvious deformity  Abdomen: Soft.  Mildy distended.  Tenderness at LUQ flank lateral to  colostomy closure. Dressings clean dry and intact including colostomy site.  No evidence of peritonitis.  No incarcerated hernias.  Ext:   No deformity.  No mjr edema.  No cyanosis Skin: No petechiae / purpurea.  No major sores.  Warm and dry    Results:   Cultures: No results found for this or any previous visit (from the past 720 hour(s)).  Labs: Results for orders placed or performed during the hospital encounter of 04/01/22 (from the past 48 hour(s))  Basic metabolic panel     Status: Abnormal   Collection Time: 04/02/22  5:17 AM  Result Value Ref Range   Sodium 140 135 - 145 mmol/L   Potassium 3.5 3.5 - 5.1 mmol/L   Chloride 106 98 - 111 mmol/L   CO2 26 22 - 32 mmol/L   Glucose, Bld 136 (H) 70 - 99 mg/dL    Comment: Glucose reference range applies only to samples taken after fasting for at least 8 hours.   BUN 13 8 - 23 mg/dL   Creatinine, Ser 1.07 (H) 0.44 - 1.00 mg/dL   Calcium 8.7 (L) 8.9 - 10.3 mg/dL   GFR, Estimated 56 (L) >60 mL/min    Comment: (NOTE) Calculated using the CKD-EPI Creatinine Equation (2021)    Anion gap 8 5 - 15  Comment: Performed at [facility], [place] [address], [place], [place] [address]  CBC     Status: Abnormal   Collection Time: 04/02/22  5:17 AM  Result Value Ref Range   WBC 21.4 (H) 4.0 - 10.5 K/uL   RBC 3.30 (L) 3.87 - 5.11 MIL/uL   Hemoglobin 8.4 (L) 12.0 - 15.0 g/dL   HCT 26.1 (L) 36.0 - 46.0 %   MCV 79.1 (L) 80.0 - 100.0 fL   MCH 25.5 (L) 26.0 - 34.0 pg   MCHC 32.2 30.0 - 36.0 g/dL   RDW 17.2 (H) 11.5 - 15.5 %   Platelets 374 150 - 400 K/uL   nRBC 0.0 0.0 - 0.2 %    Comment: Performed at [facility], [place] [address], [place], [place] [address]  Magnesium     Status: None   Collection Time: 04/02/22  5:17 AM  Result Value Ref Range   Magnesium 1.7 1.7 - 2.4 mg/dL    Comment: Performed at [facility], [place] [address], [place], [place] [address]    Imaging / Studies: No  results found.  Medications / Allergies: per chart  Antibiotics: Anti-infectives (From admission, onward)    Start     Dose/Rate Route Frequency Ordered Stop   04/01/22 2200  cefoTEtan (CEFOTAN) 2 g in sodium chloride 0.9 % 100 mL IVPB        2 g 200 mL/hr over 30 Minutes Intravenous Every 12 hours 04/01/22 1714 04/01/22 2316   04/01/22 1400  neomycin (MYCIFRADIN) tablet 1,000 mg  Status:  Discontinued       See Hyperspace for full Linked Orders Report.   1,000 mg Oral 3 times per day 04/01/22 0756 04/01/22 0807   04/01/22 1400  metroNIDAZOLE (FLAGYL) tablet 1,000 mg  Status:  Discontinued       See Hyperspace for full Linked Orders Report.   1,000 mg Oral 3 times per day 04/01/22 0756 04/01/22 0807   04/01/22 0801  sodium chloride 0.9 % with cefoTEtan (CEFOTAN) ADS Med       Note to Pharmacy: Kyra Leyland E: cabinet override      04/01/22 0801 04/01/22 1015   04/01/22 0800  cefoTEtan (CEFOTAN) 2 g in sodium chloride 0.9 % 100 mL IVPB        2 g 200 mL/hr over 30 Minutes Intravenous On call to O.R. 04/01/22 0756 04/01/22 1030         Note: Portions of this report may have been transcribed using voice recognition software. Every effort was made to ensure accuracy; however, inadvertent computerized transcription errors may be present.   Any transcriptional errors that result from this process are unintentional.    Adin Hector, MD, FACS, MASCRS Esophageal, Gastrointestinal & Colorectal Surgery Robotic and Minimally Invasive Surgery  [facility], [place]  [place]. [address] [place], [place] [address] [phone] Fax [phone] Main  CONTACT INFORMATION:  Weekday (9AM-5PM): Call [facility] main office at [phone]  Weeknight (5PM-9AM) or Weekend/Holiday: Check [email or web address] (password " [email or web address]") for General Surgery [facility] coverage  (Please, do not use SecureChat as it is not reliable communication to  operating surgeons for immediate patient care)      04/03/2022  7:46 AM

## 2022-04-04 ENCOUNTER — Inpatient Hospital Stay (HOSPITAL_COMMUNITY): Payer: Medicare Other

## 2022-04-04 LAB — CBC
HCT: 24.6 % — ABNORMAL LOW (ref 36.0–46.0)
Hemoglobin: 7.9 g/dL — ABNORMAL LOW (ref 12.0–15.0)
MCH: 25.4 pg — ABNORMAL LOW (ref 26.0–34.0)
MCHC: 32.1 g/dL (ref 30.0–36.0)
MCV: 79.1 fL — ABNORMAL LOW (ref 80.0–100.0)
Platelets: 391 10*3/uL (ref 150–400)
RBC: 3.11 MIL/uL — ABNORMAL LOW (ref 3.87–5.11)
RDW: 17.9 % — ABNORMAL HIGH (ref 11.5–15.5)
WBC: 25.9 10*3/uL — ABNORMAL HIGH (ref 4.0–10.5)
nRBC: 0 % (ref 0.0–0.2)

## 2022-04-04 LAB — BASIC METABOLIC PANEL
Anion gap: 6 (ref 5–15)
BUN: 16 mg/dL (ref 8–23)
CO2: 32 mmol/L (ref 22–32)
Calcium: 9.2 mg/dL (ref 8.9–10.3)
Chloride: 103 mmol/L (ref 98–111)
Creatinine, Ser: 0.79 mg/dL (ref 0.44–1.00)
GFR, Estimated: >60 mL/min (ref >60)
Glucose, Bld: 112 mg/dL — ABNORMAL HIGH (ref 70–99)
Potassium: 3.5 mmol/L (ref 3.5–5.1)
Sodium: 141 mmol/L (ref 135–145)

## 2022-04-04 LAB — SURGICAL PATHOLOGY

## 2022-04-04 LAB — MAGNESIUM: Magnesium: 2.1 mg/dL (ref 1.7–2.4)

## 2022-04-04 MED ORDER — ENALAPRILAT 1.25 MG/ML IV SOLN: 0.6250 mg | Freq: Four times a day (QID) | INTRAVENOUS | Status: DC | PRN

## 2022-04-04 MED ORDER — FAMOTIDINE IN NACL 20-0.9 MG/50ML-% IV SOLN
20.0000 mg | Freq: Two times a day (BID) | INTRAVENOUS | Status: DC
  Administered 2022-04-04 – 2022-04-11 (×16): 20 mg via INTRAVENOUS
  Filled 2022-04-04 (×16): qty 50

## 2022-04-04 MED ORDER — HYDROMORPHONE HCL 1 MG/ML IJ SOLN: 0.5000 mg | INTRAMUSCULAR | Status: DC | PRN

## 2022-04-04 MED ORDER — METOCLOPRAMIDE HCL 5 MG/ML IJ SOLN
5.0000 mg | Freq: Three times a day (TID) | INTRAMUSCULAR | Status: DC | PRN
  Administered 2022-04-09 – 2022-04-10 (×2): 10 mg via INTRAVENOUS
  Filled 2022-04-04 (×2): qty 2

## 2022-04-04 MED ORDER — LACTATED RINGERS IV BOLUS
1000.0000 mL | Freq: Once | INTRAVENOUS | Status: AC
  Administered 2022-04-04: 1000 mL via INTRAVENOUS

## 2022-04-04 MED ORDER — ONDANSETRON HCL 4 MG/2ML IJ SOLN
4.0000 mg | Freq: Four times a day (QID) | INTRAMUSCULAR | Status: AC
  Administered 2022-04-04 – 2022-04-06 (×8): 4 mg via INTRAVENOUS
  Filled 2022-04-04 (×8): qty 2

## 2022-04-04 MED ORDER — LACTATED RINGERS IV SOLN: INTRAVENOUS | Status: AC

## 2022-04-04 MED ORDER — HYDROMORPHONE HCL 1 MG/ML IJ SOLN
0.5000 mg | INTRAMUSCULAR | Status: DC | PRN
  Administered 2022-04-04: 0.5 mg via INTRAVENOUS
  Administered 2022-04-04 – 2022-04-11 (×30): 1 mg via INTRAVENOUS
  Administered 2022-04-12: 0.5 mg via INTRAVENOUS
  Administered 2022-04-13 – 2022-04-15 (×2): 1 mg via INTRAVENOUS
  Filled 2022-04-04 (×36): qty 1

## 2022-04-04 MED ORDER — METHOCARBAMOL 1000 MG/10ML IJ SOLN
1000.0000 mg | Freq: Four times a day (QID) | INTRAVENOUS | Status: DC | PRN
  Administered 2022-04-04 – 2022-04-09 (×8): 1000 mg via INTRAVENOUS
  Filled 2022-04-04: qty 10
  Filled 2022-04-04 (×5): qty 1000
  Filled 2022-04-04: qty 10
  Filled 2022-04-04 (×2): qty 1000

## 2022-04-04 NOTE — Care Management Important Message (Signed)
Important Message  Patient Details IM Letter placed in Patients room. Name: Regina Roberts MRN: [id number] Date of Birth: February 15, 1951   Medicare Important Message Given:  Yes     Kerin Salen 04/04/2022, 3:38 PM

## 2022-04-04 NOTE — Progress Notes (Signed)
Regina Roberts [id number] 02/13/1951  CARE TEAM:  PCP: Cassandria Anger, MD  Outpatient Care Team: Patient Care Team: Plotnikov, Evie Lacks, MD as PCP - General Fontaine, Belinda Block, MD (Inactive) as Consulting Physician (Gynecology) Pyrtle, Lajuan Lines, MD as Consulting Physician (Gastroenterology) Clovis Riley, MD as Consulting Physician (General Surgery) Rutherford Guys, MD as Consulting Physician (Ophthalmology) Michael Boston, MD as Consulting Physician (Colon and Rectal Surgery)  Inpatient Treatment Team: Treatment Team: Attending Provider: Michael Boston, MD; Registered Nurse: Ainsley Spinner, RN; Registered Nurse: Aura Dials, RN   Problem List:   Principal Problem:   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 Active Problems:   Dyslipidemia   Essential hypertension   Obesity, Class II, BMI 35-39.9   Raynaud phenomenon   Allergic rhinitis   GERD (gastroesophageal reflux disease)   History of diverticulitis of colon   CKD (chronic kidney disease) stage 2, GFR 60-89 ml/min   3 Days Post-Op  04/01/2022  POST-OPERATIVE DIAGNOSIS:   HISTORY OF PERFORATED DIVERTICULITIS S/P HARTMANN RESECTION (COLECTOMY/COLOSTOMY) CECUM-to RECTAL STUMP FISTULA INCOMPLETE RECTOSIGMOID STUMP-to-VAGINAL CUFF FISTULA DESIRE FOR OSTOMY TAKEDOWN   PROCEDURE:   ROBOTIC COLOSTOMY TAKEDOWN, LOW ANTERIOR RECTOSIGMOID RESECTION TAKEDOWN OF CECUM-to RECTAL STUMP FISTULA TAKEDOWN OF RECTOSIGMOID STUMP-to-VAGINAL CUFF FISTULA  ROBOTIC LYSIS OF ADHESIONS x 2.5 hours ILEAL REPAIR RIGID PROCTOSCOPY TRANSVERSUS ABDOMINIS PLANE (TAP) BLOCK - BILATERAL   SURGEON:  Michael Boston, MD, FACS  OR FINDINGS:  Patient had moderately dense adhesions of omentum to anterior wall as well as small bowel adhesions and specially down the pelvis.  Patient had very dense adhesions between the proximal right corner of the rectosigmoid stump to the posterior cecal wall that correlated with the cecum to  rectosigmoid stump fistula.  Patient had rectosigmoid densely adherent to the vaginal cuff with a pocket suspicious for either nearly resolved abscess or possible and complete fistula from rectal stump to the vaginal cuff.  Both areas repaired with absorbable serrated V-Loc suture in a running fashion on the cecum and vaginal cuff.  Rectosigmoid resection done of the 2 potential fistulous connections.     It is a and descending colon to and proximal rectum 29 EEA stapled anastomosis.  Anastomosis rests 12 cm from the anal verge.    Assessment  Probable ileus after prolonged lysis adhesions and takedown of rectal stump fistulas to cecum and vagina.  [facility] Stay = 3 days)  Plan:  -NGT placement. Scheduled nausea control.  IV fluid bolus and restarted IV fluids with as needed backup.  Drain serosanguineous and no peritonitis argues against any leak or abdominal catastrophe at this time.  Follow closely.  May need CT scan postop day 5 if does not open up soon.  Hold off on TPN unless ileus persists by postop day 7  -Some expected postoperative delay to return of bowel function given prolonged lyse adhesions and extensive surgery.  With some bloating, go back to dysphagia 1/full liquid diet until flatus or bowel movement.  -History of chronic kidney disease.  No oliguria and creatinine stable.  Follow closely.  Gently rehydrate   Hypertension.  Use as needed medications for now and follow   -Follow-up on pathology  -Shorten interval of hydromorphone since no oral options.  Robaxin.  Ice/heat.    -VTE prophylaxis- SCDs, enoxaparin, etc.  -mobilize as tolerated to help recovery.  Despite her distention and vomiting she has been mobilizing more.  Hopefully that will help the bowels open sooner   Disposition:  Disposition:  The  patient is from: Home  Anticipate discharge to:  Home  Anticipated Date of Discharge is:  June 17,2023    Barriers to discharge:  Pending Clinical  improvement (more likely than not)  Patient currently is NOT MEDICALLY STABLE for discharge from the hospital from a surgery standpoint.      I reviewed nursing notes, last 24 h vitals and pain scores, last 48 h intake and output, last 24 h labs and trends, and last 24 h imaging results. I have reviewed this patient's available data, including medical history, events of note, test results, etc as part of my evaluation.  A significant portion of that time was spent in counseling.  Care during the described time interval was provided by me.  This care required moderate level of medical decision making.  04/04/2022    Subjective: (Chief complaint)  Felt more nauseated.  Vomited last night.  Placed on sips.  Feeling nauseated again.  Walked in hallways with nurses yesterday.  Crampy and uncomfortable but no severe pain.  Objective:  Vital signs:  Vitals:   04/03/22 1321 04/03/22 2204 04/04/22 0222 04/04/22 0514  BP: 111/73 138/71  140/75  Pulse: 100 (!) 110  (!) 101  Resp: '20 18  18  '$ Temp: (!) 97.5 F (36.4 C) 98.7 F (37.1 C)  99.2 F (37.3 C)  TempSrc: Oral Oral  Oral  SpO2: 95% 96%  97%  Weight:   82.2 kg   Height:        Last BM Date : 03/31/22  Intake/Output   Yesterday:  06/11 0701 - 06/12 0700 In: 9 [P.O.:930] Out: 1181 [Urine:1000; Emesis/NG output:1; Drains:180] This shift:  Total I/O In: 330 [P.O.:330] Out: 550 [Urine:400; Drains:150]  Bowel function:  Flatus: No  BM:  No  Drain: Serosanguinous   Physical Exam:  General: Pt awake/alert in mild acute distress.  Not toxic or sickly.  Just a little tired. Eyes: PERRL, normal EOM.  Sclera clear.  No icterus Neuro: CN II-XII intact w/o focal sensory/motor deficits. Lymph: No head/neck/groin lymphadenopathy Psych:  No delerium/psychosis/paranoia.  Oriented x 4 HENT: Normocephalic, Mucus membranes moist.  No thrush Neck: Supple, No tracheal deviation.  No obvious thyromegaly Chest: No pain to  chest wall compression.  Good respiratory excursion.  No audible wheezing CV:  Pulses intact.  Regular rhythm.  No major extremity edema MS: Normal AROM mjr joints.  No obvious deformity  Abdomen: Soft.  Mildy distended.  Tenderness at LUQ flank lateral to colostomy closure only.  All dressings removed.  Colostomy with clean granulation.  No dehiscence or abscess.  Drain thinly serosanguineous.  No succus bilious or feculent changes.. Mild vague abdominal discomfort but no guarding or rebound tenderness.  No evidence of peritonitis.  No incarcerated hernias.  Ext:   No deformity.  No mjr edema.  No cyanosis Skin: No petechiae / purpurea.  No major sores.  Warm and dry    Results:   Cultures: No results found for this or any previous visit (from the past 720 hour(s)).  Labs: Results for orders placed or performed during the hospital encounter of 04/01/22 (from the past 48 hour(s))  Basic metabolic panel     Status: Abnormal   Collection Time: 04/04/22  4:12 AM  Result Value Ref Range   Sodium 141 135 - 145 mmol/L   Potassium 3.5 3.5 - 5.1 mmol/L   Chloride 103 98 - 111 mmol/L   CO2 32 22 - 32 mmol/L   Glucose, Bld 112 (H)  70 - 99 mg/dL    Comment: Glucose reference range applies only to samples taken after fasting for at least 8 hours.   BUN 16 8 - 23 mg/dL   Creatinine, Ser 0.79 0.44 - 1.00 mg/dL   Calcium 9.2 8.9 - 10.3 mg/dL   GFR, Estimated >60 >60 mL/min    Comment: (NOTE) Calculated using the CKD-EPI Creatinine Equation (2021)    Anion gap 6 5 - 15    Comment: Performed at [facility], [place] [address], [place], [place] [address]  CBC     Status: Abnormal   Collection Time: 04/04/22  4:12 AM  Result Value Ref Range   WBC 25.9 (H) 4.0 - 10.5 K/uL   RBC 3.11 (L) 3.87 - 5.11 MIL/uL   Hemoglobin 7.9 (L) 12.0 - 15.0 g/dL   HCT 24.6 (L) 36.0 - 46.0 %   MCV 79.1 (L) 80.0 - 100.0 fL   MCH 25.4 (L) 26.0 - 34.0 pg   MCHC 32.1 30.0 - 36.0 g/dL   RDW 17.9 (H)  11.5 - 15.5 %   Platelets 391 150 - 400 K/uL   nRBC 0.0 0.0 - 0.2 %    Comment: Performed at [facility], [place] [address] [place], [place] [address]  Magnesium     Status: None   Collection Time: 04/04/22  4:12 AM  Result Value Ref Range   Magnesium 2.1 1.7 - 2.4 mg/dL    Comment: Performed at [facility], [place] [address] [place], [place] [address]    Imaging / Studies: No results found.  Medications / Allergies: per chart  Antibiotics: Anti-infectives (From admission, onward)    Start     Dose/Rate Route Frequency Ordered Stop   04/01/22 2200  cefoTEtan (CEFOTAN) 2 g in sodium chloride 0.9 % 100 mL IVPB        2 g 200 mL/hr over 30 Minutes Intravenous Every 12 hours 04/01/22 1714 04/01/22 2316   04/01/22 1400  neomycin (MYCIFRADIN) tablet 1,000 mg  Status:  Discontinued       See Hyperspace for full Linked Orders Report.   1,000 mg Oral 3 times per day 04/01/22 0756 04/01/22 0807   04/01/22 1400  metroNIDAZOLE (FLAGYL) tablet 1,000 mg  Status:  Discontinued       See Hyperspace for full Linked Orders Report.   1,000 mg Oral 3 times per day 04/01/22 0756 04/01/22 0807   04/01/22 0801  sodium chloride 0.9 % with cefoTEtan (CEFOTAN) ADS Med       Note to Pharmacy: Kyra Leyland E: cabinet override      04/01/22 0801 04/01/22 1015   04/01/22 0800  cefoTEtan (CEFOTAN) 2 g in sodium chloride 0.9 % 100 mL IVPB        2 g 200 mL/hr over 30 Minutes Intravenous On call to O.R. 04/01/22 0756 04/01/22 1030         Note: Portions of this report may have been transcribed using voice recognition software. Every effort was made to ensure accuracy; however, inadvertent computerized transcription errors may be present.   Any transcriptional errors that result from this process are unintentional.    Adin Hector, MD, FACS, MASCRS Esophageal, Gastrointestinal & Colorectal Surgery Robotic and Minimally Invasive Surgery  [facility], [place]  [place]. [address] [place], [place] [address] [phone] Fax [phone] Main  CONTACT INFORMATION:  Weekday (9AM-5PM): Call [facility] main office  at [phone]  Weeknight (5PM-9AM) or Weekend/Holiday: Check [email or web address] (password " [email or web address]") for General Surgery [facility] coverage  (Please, do not use SecureChat as it is not reliable communication to operating surgeons for immediate patient care)      04/04/2022  6:52 AM

## 2022-04-05 LAB — CBC
HCT: 24.6 % — ABNORMAL LOW (ref 36.0–46.0)
Hemoglobin: 7.8 g/dL — ABNORMAL LOW (ref 12.0–15.0)
MCH: 25.3 pg — ABNORMAL LOW (ref 26.0–34.0)
MCHC: 31.7 g/dL (ref 30.0–36.0)
MCV: 79.9 fL — ABNORMAL LOW (ref 80.0–100.0)
Platelets: 425 10*3/uL — ABNORMAL HIGH (ref 150–400)
RBC: 3.08 MIL/uL — ABNORMAL LOW (ref 3.87–5.11)
RDW: 18 % — ABNORMAL HIGH (ref 11.5–15.5)
WBC: 21.6 10*3/uL — ABNORMAL HIGH (ref 4.0–10.5)
nRBC: 0.1 % (ref 0.0–0.2)

## 2022-04-05 LAB — BASIC METABOLIC PANEL
Anion gap: 5 (ref 5–15)
BUN: 18 mg/dL (ref 8–23)
CO2: 31 mmol/L (ref 22–32)
Calcium: 9.2 mg/dL (ref 8.9–10.3)
Chloride: 107 mmol/L (ref 98–111)
Creatinine, Ser: 0.87 mg/dL (ref 0.44–1.00)
GFR, Estimated: >60 mL/min (ref >60)
Glucose, Bld: 117 mg/dL — ABNORMAL HIGH (ref 70–99)
Potassium: 4.1 mmol/L (ref 3.5–5.1)
Sodium: 143 mmol/L (ref 135–145)

## 2022-04-05 LAB — PHOSPHORUS: Phosphorus: 3 mg/dL (ref 2.5–4.6)

## 2022-04-05 LAB — MAGNESIUM: Magnesium: 2.1 mg/dL (ref 1.7–2.4)

## 2022-04-05 MED ORDER — SODIUM CHLORIDE 0.9 % IV SOLN
125.0000 mg | Freq: Once | INTRAVENOUS | Status: AC
  Administered 2022-04-05: 125 mg via INTRAVENOUS
  Filled 2022-04-05: qty 10

## 2022-04-05 NOTE — Progress Notes (Signed)
Regina Roberts [id number] 05-31-1951  CARE TEAM:  PCP: Cassandria Anger, MD  Outpatient Care Team: Patient Care Team: Plotnikov, Evie Lacks, MD as PCP - General Fontaine, Belinda Block, MD (Inactive) as Consulting Physician (Gynecology) Pyrtle, Lajuan Lines, MD as Consulting Physician (Gastroenterology) Clovis Riley, MD as Consulting Physician (General Surgery) Rutherford Guys, MD as Consulting Physician (Ophthalmology) Michael Boston, MD as Consulting Physician (Colon and Rectal Surgery)  Inpatient Treatment Team: Treatment Team: Attending Provider: Michael Boston, MD; Registered Nurse: Aura Dials, RN; Utilization Review: Barbara Cower, RN; Pharmacist: Adrian Saran, [name]   Problem List:   Principal Problem:   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 Active Problems:   Dyslipidemia   Essential hypertension   Obesity, Class II, BMI 35-39.9   Raynaud phenomenon   Allergic rhinitis   GERD (gastroesophageal reflux disease)   History of diverticulitis of colon   CKD (chronic kidney disease) stage 2, GFR 60-89 ml/min   4 Days Post-Op  04/01/2022  POST-OPERATIVE DIAGNOSIS:   HISTORY OF PERFORATED DIVERTICULITIS S/P HARTMANN RESECTION (COLECTOMY/COLOSTOMY) CECUM-to RECTAL STUMP FISTULA INCOMPLETE RECTOSIGMOID STUMP-to-VAGINAL CUFF FISTULA DESIRE FOR OSTOMY TAKEDOWN   PROCEDURE:   ROBOTIC COLOSTOMY TAKEDOWN, LOW ANTERIOR RECTOSIGMOID RESECTION TAKEDOWN OF CECUM-to RECTAL STUMP FISTULA TAKEDOWN OF RECTOSIGMOID STUMP-to-VAGINAL CUFF FISTULA  ROBOTIC LYSIS OF ADHESIONS x 2.5 hours ILEAL REPAIR RIGID PROCTOSCOPY TRANSVERSUS ABDOMINIS PLANE (TAP) BLOCK - BILATERAL   SURGEON:  Michael Boston, MD, FACS  OR FINDINGS:  Patient had moderately dense adhesions of omentum to anterior wall as well as small bowel adhesions and specially down the pelvis.  Patient had very dense adhesions between the proximal right corner of the rectosigmoid stump to the posterior cecal wall that  correlated with the cecum to rectosigmoid stump fistula.  Patient had rectosigmoid densely adherent to the vaginal cuff with a pocket suspicious for either nearly resolved abscess or possible and complete fistula from rectal stump to the vaginal cuff.  Both areas repaired with absorbable serrated V-Loc suture in a running fashion on the cecum and vaginal cuff.  Rectosigmoid resection done of the 2 potential fistulous connections.     It is a and descending colon to and proximal rectum 29 EEA stapled anastomosis.  Anastomosis rests 12 cm from the anal verge.    Assessment  Ileus after prolonged lysis adhesions and takedown of rectal stump fistulas to cecum and vagina.  [facility] Stay = 4 days)  Plan:  -NGT to LIWS.  Scheduled nausea control.  IV fluids with as needed backup.  Drain serosanguineous and no peritonitis argues against any leak or abdominal catastrophe at this time.  Abdomen softer with much less discomfort after NG tube reassuring.  Follow closely.  May need CT scan postop day 5 if does not open up soon.  Hold off on TPN unless ileus persists by postop day 7  -Some expected postoperative delay to return of bowel function given prolonged lyse adhesions and extensive surgery.  With some bloating, go back to dysphagia 1/full liquid diet until flatus or bowel movement.  -History of chronic kidney disease.  No oliguria and creatinine stable.  Follow closely.  Gently rehydrate   Hypertension.  Use as needed medications for now and follow   -Follow-up on pathology -shows inflammation but no cancer or tumor or other surprises at the colostomy, rectal stump, distal anastomotic ring.  Patient up to  -Anemia.  Most likely iron deficiency and chronic disease in the setting of acute blood loss.  Give IV iron  since enteral route not an option.  -Shorten interval of hydromorphone since no oral options.  Robaxin.  Ice/heat.    -VTE prophylaxis- SCDs, enoxaparin, etc.  -mobilize as  tolerated to help recovery.  Despite her distention and vomiting she has been mobilizing more.  Hopefully that will help the bowels open sooner   Disposition:  Disposition:  The patient is from: Home  Anticipate discharge to:  Home  Anticipated Date of Discharge is:  June 17,2023    Barriers to discharge:  Pending Clinical improvement (more likely than not)  Patient currently is NOT MEDICALLY STABLE for discharge from the hospital from a surgery standpoint.      I reviewed nursing notes, last 24 h vitals and pain scores, last 48 h intake and output, last 24 h labs and trends, and last 24 h imaging results. I have reviewed this patient's available data, including medical history, events of note, test results, etc as part of my evaluation.  A significant portion of that time was spent in counseling.  Care during the described time interval was provided by me.  This care required moderate level of medical decision making.  04/05/2022    Subjective: (Chief complaint)  A lot of vomiting with NG tube placed but feels much better.  Actually walking the hallways with mild nursing assistant.  Patient feels less bloating and nausea and discomfort in the abdomen.  Patient appreciative of nursing and surgery care.  Objective:  Vital signs:  Vitals:   04/04/22 0514 04/04/22 1343 04/04/22 2232 04/05/22 0643  BP: 140/75 127/71 123/67 116/68  Pulse: (!) 101 100 99 87  Resp: '18 16 12   '$ Temp: 99.2 F (37.3 C) 98.6 F (37 C) 98.6 F (37 C) 98.5 F (36.9 C)  TempSrc: Oral Oral Oral Axillary  SpO2: 97% (!) 83% 94% (!) 87%  Weight:      Height:        Last BM Date : 03/31/22  Intake/Output   Yesterday:  06/12 0701 - 06/13 0700 In: 1840 [P.O.:120; I.V.:1520.1; IV Piggyback:199.9] Out: 775 [Urine:500; Emesis/NG output:150; Drains:125] This shift:  No intake/output data recorded.  Bowel function:  Flatus: No  BM:  No  Drain: Serosanguinous   Physical Exam:  General:  Pt awake/alert in no acute distress.  Not toxic or sickly.  Just a little tired. Eyes: PERRL, normal EOM.  Sclera clear.  No icterus Neuro: CN II-XII intact w/o focal sensory/motor deficits. Lymph: No head/neck/groin lymphadenopathy Psych:  No delerium/psychosis/paranoia.  Oriented x 4 HENT: Normocephalic, Mucus membranes moist.  No thrush Neck: Supple, No tracheal deviation.  No obvious thyromegaly Chest: No pain to chest wall compression.  Good respiratory excursion.  No audible wheezing CV:  Pulses intact.  Regular rhythm.  No major extremity edema MS: Normal AROM mjr joints.  No obvious deformity  Abdomen: Soft.  Mildy distended.  Tenderness at drain site and old colostomy incision only.  Less left flank soreness   old colostomy wound with clean granulation.  No dehiscence or abscess.  Drain thinly serosanguineous.  No succus bilious or feculent changes.. Mild vague abdominal discomfort but no guarding or rebound tenderness.  No evidence of peritonitis.  No incarcerated hernias.  Ext:   No deformity.  No mjr edema.  No cyanosis Skin: No petechiae / purpurea.  No major sores.  Warm and dry    Results:   Cultures: No results found for this or any previous visit (from the past 720 hour(s)).  Labs: Results for  orders placed or performed during the hospital encounter of 04/01/22 (from the past 48 hour(s))  Basic metabolic panel     Status: Abnormal   Collection Time: 04/04/22  4:12 AM  Result Value Ref Range   Sodium 141 135 - 145 mmol/L   Potassium 3.5 3.5 - 5.1 mmol/L   Chloride 103 98 - 111 mmol/L   CO2 32 22 - 32 mmol/L   Glucose, Bld 112 (H) 70 - 99 mg/dL    Comment: Glucose reference range applies only to samples taken after fasting for at least 8 hours.   BUN 16 8 - 23 mg/dL   Creatinine, Ser 0.79 0.44 - 1.00 mg/dL   Calcium 9.2 8.9 - 10.3 mg/dL   GFR, Estimated >60 >60 mL/min    Comment: (NOTE) Calculated using the CKD-EPI Creatinine Equation (2021)    Anion gap 6 5 -  15    Comment: Performed at [facility], [place] [address], [place], [place] [address]  CBC     Status: Abnormal   Collection Time: 04/04/22  4:12 AM  Result Value Ref Range   WBC 25.9 (H) 4.0 - 10.5 K/uL   RBC 3.11 (L) 3.87 - 5.11 MIL/uL   Hemoglobin 7.9 (L) 12.0 - 15.0 g/dL   HCT 24.6 (L) 36.0 - 46.0 %   MCV 79.1 (L) 80.0 - 100.0 fL   MCH 25.4 (L) 26.0 - 34.0 pg   MCHC 32.1 30.0 - 36.0 g/dL   RDW 17.9 (H) 11.5 - 15.5 %   Platelets 391 150 - 400 K/uL   nRBC 0.0 0.0 - 0.2 %    Comment: Performed at [facility], [place] [address], [place], [place] [address]  Magnesium     Status: None   Collection Time: 04/04/22  4:12 AM  Result Value Ref Range   Magnesium 2.1 1.7 - 2.4 mg/dL    Comment: Performed at [facility], [place] [address], [place], [place] [address]  CBC     Status: Abnormal   Collection Time: 04/05/22  4:12 AM  Result Value Ref Range   WBC 21.6 (H) 4.0 - 10.5 K/uL   RBC 3.08 (L) 3.87 - 5.11 MIL/uL   Hemoglobin 7.8 (L) 12.0 - 15.0 g/dL   HCT 24.6 (L) 36.0 - 46.0 %   MCV 79.9 (L) 80.0 - 100.0 fL   MCH 25.3 (L) 26.0 - 34.0 pg   MCHC 31.7 30.0 - 36.0 g/dL   RDW 18.0 (H) 11.5 - 15.5 %   Platelets 425 (H) 150 - 400 K/uL   nRBC 0.1 0.0 - 0.2 %    Comment: Performed at [name], [place] [address] [place],  [address]  Basic metabolic panel     Status: Abnormal   Collection Time: 04/05/22  4:12 AM  Result Value Ref Range   Sodium 143 135 - 145 mmol/L   Potassium 4.1 3.5 - 5.1 mmol/L   Chloride 107 98 - 111 mmol/L   CO2 31 22 - 32 mmol/L   Glucose, Bld 117 (H) 70 - 99 mg/dL    Comment: Glucose reference range applies only to samples taken after fasting for at least 8 hours.   BUN 18 8 - 23 mg/dL   Creatinine, Ser 0.87 0.44 - 1.00 mg/dL   Calcium 9.2 8.9 - 10.3 mg/dL   GFR, Estimated >60 >60 mL/min    Comment: (NOTE) Calculated using the CKD-EPI Creatinine Equation (2021)    Anion gap  5 5 -  15    Comment: Performed at [facility], [place] [address], [place], [place] [address]  Magnesium     Status: None   Collection Time: 04/05/22  4:12 AM  Result Value Ref Range   Magnesium 2.1 1.7 - 2.4 mg/dL    Comment: Performed at [facility], [place] [address], [place], [place] [address]  Phosphorus     Status: None   Collection Time: 04/05/22  4:12 AM  Result Value Ref Range   Phosphorus 3.0 2.5 - 4.6 mg/dL    Comment: Performed at [facility], [place] [address] [place], [place] [address]    Imaging / Studies: DG Abd 1 View  Result Date: 04/04/2022 CLINICAL DATA:  Enteric tube placement EXAM: ABDOMEN - 1 VIEW COMPARISON:  03/15/2021 abdominal radiograph FINDINGS: Enteric tube terminates in the proximal body of the stomach. Several mildly dilated small bowel loops throughout the abdomen. Right lower quadrant approach surgical drain terminates in the left abdomen. No evidence of pneumatosis or pneumoperitoneum. Clear lung bases. Subcutaneous emphysema in the lateral abdominal wall bilaterally. No radiopaque nephrolithiasis. IMPRESSION: 1. Enteric tube terminates in the proximal body of the stomach. 2. Surgical drain terminates in the left abdomen. 3. Mildly dilated small bowel loops throughout the abdomen, compatible with mild postoperative adynamic ileus. Electronically Signed   By: Ilona Sorrel M.D.   On: 04/04/2022 12:12    Medications / Allergies: per chart  Antibiotics: Anti-infectives (From admission, onward)    Start     Dose/Rate Route Frequency Ordered Stop   04/01/22 2200  cefoTEtan (CEFOTAN) 2 g in sodium chloride 0.9 % 100 mL IVPB        2 g 200 mL/hr over 30 Minutes Intravenous Every 12 hours 04/01/22 1714 04/01/22 2316   04/01/22 1400  neomycin (MYCIFRADIN) tablet 1,000 mg  Status:  Discontinued       See Hyperspace for full Linked Orders Report.   1,000 mg Oral 3 times per day 04/01/22 0756 04/01/22 0807   04/01/22  1400  metroNIDAZOLE (FLAGYL) tablet 1,000 mg  Status:  Discontinued       See Hyperspace for full Linked Orders Report.   1,000 mg Oral 3 times per day 04/01/22 0756 04/01/22 0807   04/01/22 0801  sodium chloride 0.9 % with cefoTEtan (CEFOTAN) ADS Med       Note to Pharmacy: Kyra Leyland E: cabinet override      04/01/22 0801 04/01/22 1015   04/01/22 0800  cefoTEtan (CEFOTAN) 2 g in sodium chloride 0.9 % 100 mL IVPB        2 g 200 mL/hr over 30 Minutes Intravenous On call to O.R. 04/01/22 0756 04/01/22 1030         Note: Portions of this report may have been transcribed using voice recognition software. Every effort was made to ensure accuracy; however, inadvertent computerized transcription errors may be present.   Any transcriptional errors that result from this process are unintentional.    Adin Hector, MD, FACS, MASCRS Esophageal, Gastrointestinal & Colorectal Surgery Robotic and Minimally Invasive Surgery  [facility], [place]  [place]. [address] [place],  [address] [phone] Fax [phone] Main  CONTACT INFORMATION:  Weekday (9AM-5PM): Call [facility] main office at [phone]  Weeknight (5PM-9AM) or Weekend/Holiday: Check [email or web address] (password " [email or web address]") for General Surgery [facility] coverage  (Please, do not use SecureChat as it is not reliable  communication to operating surgeons for immediate patient care)      04/05/2022  8:15 AM

## 2022-04-06 LAB — BASIC METABOLIC PANEL
Anion gap: 10 (ref 5–15)
BUN: 17 mg/dL (ref 8–23)
CO2: 27 mmol/L (ref 22–32)
Calcium: 9.1 mg/dL (ref 8.9–10.3)
Chloride: 105 mmol/L (ref 98–111)
Creatinine, Ser: 0.89 mg/dL (ref 0.44–1.00)
GFR, Estimated: >60 mL/min (ref >60)
Glucose, Bld: 98 mg/dL (ref 70–99)
Potassium: 4.1 mmol/L (ref 3.5–5.1)
Sodium: 142 mmol/L (ref 135–145)

## 2022-04-06 LAB — CBC
HCT: 24.4 % — ABNORMAL LOW (ref 36.0–46.0)
Hemoglobin: 7.4 g/dL — ABNORMAL LOW (ref 12.0–15.0)
MCH: 24.7 pg — ABNORMAL LOW (ref 26.0–34.0)
MCHC: 30.3 g/dL (ref 30.0–36.0)
MCV: 81.6 fL (ref 80.0–100.0)
Platelets: 390 10*3/uL (ref 150–400)
RBC: 2.99 MIL/uL — ABNORMAL LOW (ref 3.87–5.11)
RDW: 18.4 % — ABNORMAL HIGH (ref 11.5–15.5)
WBC: 15.7 10*3/uL — ABNORMAL HIGH (ref 4.0–10.5)
nRBC: 1 % — ABNORMAL HIGH (ref 0.0–0.2)

## 2022-04-06 MED ORDER — ORAL CARE MOUTH RINSE
15.0000 mL | OROMUCOSAL | Status: DC | PRN
Start: 2022-04-06 — End: 2022-04-18

## 2022-04-06 NOTE — Progress Notes (Signed)
Regina Roberts [id number] Jan 06, 1951  CARE TEAM:  PCP: Cassandria Anger, MD  Outpatient Care Team: Patient Care Team: Plotnikov, Evie Lacks, MD as PCP - General Fontaine, Belinda Block, MD (Inactive) as Consulting Physician (Gynecology) Pyrtle, Lajuan Lines, MD as Consulting Physician (Gastroenterology) Clovis Riley, MD as Consulting Physician (General Surgery) Rutherford Guys, MD as Consulting Physician (Ophthalmology) Michael Boston, MD as Consulting Physician (Colon and Rectal Surgery)  Inpatient Treatment Team: Treatment Team: Attending Provider: Michael Boston, MD; Technician: Abbe Amsterdam, NT; Registered Nurse: Aura Dials, RN; Pharmacist: Royetta Asal, [facility]   Problem List:   Principal Problem:   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 Active Problems:   Dyslipidemia   Essential hypertension   Obesity, Class II, BMI 35-39.9   Raynaud phenomenon   Allergic rhinitis   GERD (gastroesophageal reflux disease)   History of diverticulitis of colon   CKD (chronic kidney disease) stage 2, GFR 60-89 ml/min   5 Days Post-Op  04/01/2022  POST-OPERATIVE DIAGNOSIS:   HISTORY OF PERFORATED DIVERTICULITIS S/P HARTMANN RESECTION (COLECTOMY/COLOSTOMY) CECUM-to RECTAL STUMP FISTULA INCOMPLETE RECTOSIGMOID STUMP-to-VAGINAL CUFF FISTULA DESIRE FOR OSTOMY TAKEDOWN   PROCEDURE:   ROBOTIC COLOSTOMY TAKEDOWN, LOW ANTERIOR RECTOSIGMOID RESECTION TAKEDOWN OF CECUM-to RECTAL STUMP FISTULA TAKEDOWN OF RECTOSIGMOID STUMP-to-VAGINAL CUFF FISTULA  ROBOTIC LYSIS OF ADHESIONS x 2.5 hours ILEAL REPAIR RIGID PROCTOSCOPY TRANSVERSUS ABDOMINIS PLANE (TAP) BLOCK - BILATERAL   SURGEON:  Michael Boston, MD, FACS  OR FINDINGS:  Patient had moderately dense adhesions of omentum to anterior wall as well as small bowel adhesions and specially down the pelvis.  Patient had very dense adhesions between the proximal right corner of the rectosigmoid stump to the posterior cecal wall that  correlated with the cecum to rectosigmoid stump fistula.  Patient had rectosigmoid densely adherent to the vaginal cuff with a pocket suspicious for either nearly resolved abscess or possible and complete fistula from rectal stump to the vaginal cuff.  Both areas repaired with absorbable serrated V-Loc suture in a running fashion on the cecum and vaginal cuff.  Rectosigmoid resection done of the 2 potential fistulous connections.     It is a and descending colon to and proximal rectum 29 EEA stapled anastomosis.  Anastomosis rests 12 cm from the anal verge.    Assessment  Ileus after prolonged lysis adhesions and takedown of rectal stump fistulas to cecum & vagina.  [facility] Stay = 5 days)  Plan:  Expected postoperative delay to return of bowel function given prolonged lyse adhesions and extensive surgery.  NGT to LIWS.  Scheduled nausea control.  Output less & more thin with improved Sx hopeful signs that ileus will resolve in next few days  IV fluids with as needed backup.  Drain serosanguineous and no peritonitis argues against any leak or abdominal catastrophe at this time.  Abdomen softer with much less discomfort after NG tube reassuring.  Follow closely.  May need CT scan postop day 5 if does not open up soon.  Hold off on TPN unless ileus persists by postop day 7  History of chronic kidney disease.  No oliguria and creatinine stable.  Follow closely.  Gently rehydrate   Hypertension.  Use as needed medications for now and follow   Follow-up on pathology -shows inflammation but no cancer or tumor or other surprises at the colostomy, rectal stump, distal anastomotic ring.  Patient up to  Anemia.  Most likely iron deficiency and chronic disease in the setting of acute blood loss.  Give IV iron  since enteral route not an option.  Shorten interval of hydromorphone since no oral options.  Robaxin.  Ice/heat.    VTE prophylaxis- SCDs, enoxaparin, etc.  mobilize as tolerated to help  recovery.  Despite her distention and vomiting she has been mobilizing more.  Hopefully that will help the bowels open sooner   Disposition:  Disposition:  The patient is from: Home  Anticipate discharge to:  Home  Anticipated Date of Discharge is:  June 17,2023    Barriers to discharge:  Pending Clinical improvement (more likely than not)  Patient currently is NOT MEDICALLY STABLE for discharge from the hospital from a surgery standpoint.      I reviewed nursing notes, last 24 h vitals and pain scores, last 48 h intake and output, last 24 h labs and trends, and last 24 h imaging results. I have reviewed this patient's available data, including medical history, events of note, test results, etc as part of my evaluation.  A significant portion of that time was spent in counseling.  Care during the described time interval was provided by me.  This care required moderate level of medical decision making.  04/06/2022    Subjective: (Chief complaint)  Less sore Walking in hallways RN Manuela Schwartz K in room  Objective:  Vital signs:  Vitals:   04/05/22 0643 04/05/22 1257 04/05/22 2007 04/06/22 0439  BP: 116/68 132/77 126/76 125/72  Pulse: 87 87 80 86  Resp:  '16 18 18  '$ Temp: 98.5 F (36.9 C) 98.5 F (36.9 C) 99.3 F (37.4 C) 99 F (37.2 C)  TempSrc: Axillary Oral Oral Oral  SpO2: (!) 87% (!) 88% 90% 94%  Weight:      Height:        Last BM Date : 03/31/22  Intake/Output   Yesterday:  06/13 0701 - 06/14 0700 In: 1460.1 [P.O.:30; I.V.:1173.3; NG/GT:10; IV Piggyback:246.8] Out: 885 [Urine:625; Emesis/NG output:200; Drains:60] This shift:  No intake/output data recorded.  Bowel function:  Flatus: No  BM:  No  Drain: Serosanguinous NGT: light bilous effluent - thinner   Physical Exam:  General: Pt awake/alert in no acute distress.  Not toxic or sickly.  Just a little tired. Eyes: PERRL, normal EOM.  Sclera clear.  No icterus Neuro: CN II-XII intact w/o focal  sensory/motor deficits. Lymph: No head/neck/groin lymphadenopathy Psych:  No delerium/psychosis/paranoia.  Oriented x 4 HENT: Normocephalic, Mucus membranes moist.  No thrush Neck: Supple, No tracheal deviation.  No obvious thyromegaly Chest: No pain to chest wall compression.  Good respiratory excursion.  No audible wheezing CV:  Pulses intact.  Regular rhythm.  No major extremity edema MS: Normal AROM mjr joints.  No obvious deformity  Abdomen: Soft.  Mildy distended.  Tenderness at drain site and old colostomy incision only.  Old colostomy wound with clean granulation.  No dehiscence or abscess.  Drain thinly serosanguineous.  No succus bilious or feculent changes.. Mild vague abdominal discomfort but no guarding or rebound tenderness.  No evidence of peritonitis.  No incarcerated hernias.  Ext:   No deformity.  No mjr edema.  No cyanosis Skin: No petechiae / purpurea.  No major sores.  Warm and dry    Results:   Cultures: No results found for this or any previous visit (from the past 720 hour(s)).  Labs: Results for orders placed or performed during the hospital encounter of 04/01/22 (from the past 48 hour(s))  CBC     Status: Abnormal   Collection Time: 04/05/22  4:12 AM  Result Value Ref Range   WBC 21.6 (H) 4.0 - 10.5 K/uL   RBC 3.08 (L) 3.87 - 5.11 MIL/uL   Hemoglobin 7.8 (L) 12.0 - 15.0 g/dL   HCT 24.6 (L) 36.0 - 46.0 %   MCV 79.9 (L) 80.0 - 100.0 fL   MCH 25.3 (L) 26.0 - 34.0 pg   MCHC 31.7 30.0 - 36.0 g/dL   RDW 18.0 (H) 11.5 - 15.5 %   Platelets 425 (H) 150 - 400 K/uL   nRBC 0.1 0.0 - 0.2 %    Comment: Performed at [facility], [place] [address] [place], [place] [address]  Basic metabolic panel     Status: Abnormal   Collection Time: 04/05/22  4:12 AM  Result Value Ref Range   Sodium 143 135 - 145 mmol/L   Potassium 4.1 3.5 - 5.1 mmol/L   Chloride 107 98 - 111 mmol/L   CO2 31 22 - 32 mmol/L   Glucose, Bld 117 (H) 70 - 99 mg/dL    Comment:  Glucose reference range applies only to samples taken after fasting for at least 8 hours.   BUN 18 8 - 23 mg/dL   Creatinine, Ser 0.87 0.44 - 1.00 mg/dL   Calcium 9.2 8.9 - 10.3 mg/dL   GFR, Estimated >60 >60 mL/min    Comment: (NOTE) Calculated using the CKD-EPI Creatinine Equation (2021)    Anion gap 5 5 - 15    Comment: Performed at [facility], [place] [address] [place], [place] [address]  Magnesium     Status: None   Collection Time: 04/05/22  4:12 AM  Result Value Ref Range   Magnesium 2.1 1.7 - 2.4 mg/dL    Comment: Performed at [facility], [place] [address] [place], [place] [address]  Phosphorus     Status: None   Collection Time: 04/05/22  4:12 AM  Result Value Ref Range   Phosphorus 3.0 2.5 - 4.6 mg/dL    Comment: Performed at [facility], [place] [address], [place], [place] [address]  CBC     Status: Abnormal   Collection Time: 04/06/22  4:56 AM  Result Value Ref Range   WBC 15.7 (H) 4.0 - 10.5 K/uL   RBC 2.99 (L) 3.87 - 5.11 MIL/uL   Hemoglobin 7.4 (L) 12.0 - 15.0 g/dL   HCT 24.4 (L) 36.0 - 46.0 %   MCV 81.6 80.0 - 100.0 fL   MCH 24.7 (L) 26.0 - 34.0 pg   MCHC 30.3 30.0 - 36.0 g/dL   RDW 18.4 (H) 11.5 - 15.5 %   Platelets 390 150 - 400 K/uL   nRBC 1.0 (H) 0.0 - 0.2 %    Comment: Performed at [facility], [place] [address] [place], [place] [address]  Basic metabolic panel     Status: None   Collection Time: 04/06/22  4:56 AM  Result Value Ref Range   Sodium 142 135 - 145 mmol/L   Potassium 4.1 3.5 - 5.1 mmol/L   Chloride 105 98 - 111 mmol/L   CO2 27 22 - 32 mmol/L   Glucose, Bld 98 70 - 99 mg/dL    Comment: Glucose reference range applies only to samples taken after fasting for at least 8 hours.   BUN 17 8 - 23 mg/dL   Creatinine, Ser 0.89 0.44 - 1.00 mg/dL   Calcium 9.1 8.9 - 10.3 mg/dL   GFR, Estimated >60 >60 mL/min    Comment: (  NOTE) Calculated using the CKD-EPI Creatinine Equation  (2021)    Anion gap 10 5 - 15    Comment: Performed at [facility], [place] [address] [place],  [address]    Imaging / Studies: DG Abd 1 View  Result Date: 04/04/2022 CLINICAL DATA:  Enteric tube placement EXAM: ABDOMEN - 1 VIEW COMPARISON:  03/15/2021 abdominal radiograph FINDINGS: Enteric tube terminates in the proximal body of the stomach. Several mildly dilated small bowel loops throughout the abdomen. Right lower quadrant approach surgical drain terminates in the left abdomen. No evidence of pneumatosis or pneumoperitoneum. Clear lung bases. Subcutaneous emphysema in the lateral abdominal wall bilaterally. No radiopaque nephrolithiasis. IMPRESSION: 1. Enteric tube terminates in the proximal body of the stomach. 2. Surgical drain terminates in the left abdomen. 3. Mildly dilated small bowel loops throughout the abdomen, compatible with mild postoperative adynamic ileus. Electronically Signed   By: Ilona Sorrel M.D.   On: 04/04/2022 12:12    Medications / Allergies: per chart  Antibiotics: Anti-infectives (From admission, onward)    Start     Dose/Rate Route Frequency Ordered Stop   04/01/22 2200  cefoTEtan (CEFOTAN) 2 g in sodium chloride 0.9 % 100 mL IVPB        2 g 200 mL/hr over 30 Minutes Intravenous Every 12 hours 04/01/22 1714 04/01/22 2316   04/01/22 1400  neomycin (MYCIFRADIN) tablet 1,000 mg  Status:  Discontinued       See Hyperspace for full Linked Orders Report.   1,000 mg Oral 3 times per day 04/01/22 0756 04/01/22 0807   04/01/22 1400  metroNIDAZOLE (FLAGYL) tablet 1,000 mg  Status:  Discontinued       See Hyperspace for full Linked Orders Report.   1,000 mg Oral 3 times per day 04/01/22 0756 04/01/22 0807   04/01/22 0801  sodium chloride 0.9 % with cefoTEtan (CEFOTAN) ADS Med       Note to Pharmacy: Kyra Leyland E: cabinet override      04/01/22 0801 04/01/22 1015   04/01/22 0800  cefoTEtan (CEFOTAN) 2 g in sodium chloride 0.9 % 100 mL  IVPB        2 g 200 mL/hr over 30 Minutes Intravenous On call to O.R. 04/01/22 0756 04/01/22 1030         Note: Portions of this report may have been transcribed using voice recognition software. Every effort was made to ensure accuracy; however, inadvertent computerized transcription errors may be present.   Any transcriptional errors that result from this process are unintentional.    Adin Hector, MD, FACS, MASCRS Esophageal, Gastrointestinal & Colorectal Surgery Robotic and Minimally Invasive Surgery  [facility], [place]  [place]. [address] [place],  [address] [phone] Fax [phone] Main  CONTACT INFORMATION:  Weekday (9AM-5PM): Call [facility] main office at [phone]  Weeknight (5PM-9AM) or Weekend/Holiday: Check [email or web address] (password " [email or web address]") for General Surgery [facility] coverage  (Please, do not use SecureChat as it is not reliable communication to operating surgeons for immediate patient care)      04/06/2022  7:35 AM

## 2022-04-07 LAB — BASIC METABOLIC PANEL
Anion gap: 7 (ref 5–15)
BUN: 16 mg/dL (ref 8–23)
CO2: 28 mmol/L (ref 22–32)
Calcium: 8.7 mg/dL — ABNORMAL LOW (ref 8.9–10.3)
Chloride: 108 mmol/L (ref 98–111)
Creatinine, Ser: 0.81 mg/dL (ref 0.44–1.00)
GFR, Estimated: >60 mL/min (ref >60)
Glucose, Bld: 95 mg/dL (ref 70–99)
Potassium: 3.7 mmol/L (ref 3.5–5.1)
Sodium: 143 mmol/L (ref 135–145)

## 2022-04-07 LAB — CBC
HCT: 23 % — ABNORMAL LOW (ref 36.0–46.0)
Hemoglobin: 7.1 g/dL — ABNORMAL LOW (ref 12.0–15.0)
MCH: 25 pg — ABNORMAL LOW (ref 26.0–34.0)
MCHC: 30.9 g/dL (ref 30.0–36.0)
MCV: 81 fL (ref 80.0–100.0)
Platelets: 405 10*3/uL — ABNORMAL HIGH (ref 150–400)
RBC: 2.84 MIL/uL — ABNORMAL LOW (ref 3.87–5.11)
RDW: 18 % — ABNORMAL HIGH (ref 11.5–15.5)
WBC: 15.2 10*3/uL — ABNORMAL HIGH (ref 4.0–10.5)
nRBC: 1.6 % — ABNORMAL HIGH (ref 0.0–0.2)

## 2022-04-07 NOTE — Progress Notes (Signed)
Regina Roberts [id number] 1951/04/16  CARE TEAM:  PCP: Cassandria Anger, MD  Outpatient Care Team: Patient Care Team: Plotnikov, Evie Lacks, MD as PCP - General Fontaine, Belinda Block, MD (Inactive) as Consulting Physician (Gynecology) Pyrtle, Lajuan Lines, MD as Consulting Physician (Gastroenterology) Clovis Riley, MD as Consulting Physician (General Surgery) Rutherford Guys, MD as Consulting Physician (Ophthalmology) Michael Boston, MD as Consulting Physician (Colon and Rectal Surgery)  Inpatient Treatment Team: Treatment Team: Attending Provider: Michael Boston, MD; Registered Nurse: Roselind Rily, RN   Problem List:   Principal Problem:   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 Active Problems:   Dyslipidemia   Essential hypertension   Obesity, Class II, BMI 35-39.9   Raynaud phenomenon   Allergic rhinitis   GERD (gastroesophageal reflux disease)   History of diverticulitis of colon   CKD (chronic kidney disease) stage 2, GFR 60-89 ml/min   6 Days Post-Op  04/01/2022  POST-OPERATIVE DIAGNOSIS:   HISTORY OF PERFORATED DIVERTICULITIS S/P HARTMANN RESECTION (COLECTOMY/COLOSTOMY) CECUM-to RECTAL STUMP FISTULA INCOMPLETE RECTOSIGMOID STUMP-to-VAGINAL CUFF FISTULA DESIRE FOR OSTOMY TAKEDOWN   PROCEDURE:   ROBOTIC COLOSTOMY TAKEDOWN, LOW ANTERIOR RECTOSIGMOID RESECTION TAKEDOWN OF CECUM-to RECTAL STUMP FISTULA TAKEDOWN OF RECTOSIGMOID STUMP-to-VAGINAL CUFF FISTULA  ROBOTIC LYSIS OF ADHESIONS x 2.5 hours ILEAL REPAIR RIGID PROCTOSCOPY TRANSVERSUS ABDOMINIS PLANE (TAP) BLOCK - BILATERAL   SURGEON:  Michael Boston, MD, FACS  OR FINDINGS:  Patient had moderately dense adhesions of omentum to anterior wall as well as small bowel adhesions and specially down the pelvis.  Patient had very dense adhesions between the proximal right corner of the rectosigmoid stump to the posterior cecal wall that correlated with the cecum to rectosigmoid stump fistula.  Patient  had rectosigmoid densely adherent to the vaginal cuff with a pocket suspicious for either nearly resolved abscess or possible and complete fistula from rectal stump to the vaginal cuff.  Both areas repaired with absorbable serrated V-Loc suture in a running fashion on the cecum and vaginal cuff.  Rectosigmoid resection done of the 2 potential fistulous connections.     It is a and descending colon to and proximal rectum 29 EEA stapled anastomosis.  Anastomosis rests 12 cm from the anal verge.    Assessment  Ileus after prolonged lysis adhesions and takedown of rectal stump fistulas to cecum & vagina.  [name] Stay = 6 days)  Plan:  Expected postoperative delay to return of bowel function given prolonged lyse adhesions and extensive surgery.  NGT to LIWS.  Scheduled nausea control.  Output less & more thin with improved Sx hopeful signs that ileus will resolve in next few days  Try NGT clamping w low output  D/c drain  IV fluids with as needed backup.  Hold off on TPN unless ileus persists by postop day 7  History of chronic kidney disease.  No oliguria and creatinine stable.  Follow closely.  Gently rehydrate   Hypertension.  Use as needed medications for now and follow   Pathology shows inflammation but no cancer or tumor or other surprises at the colostomy, rectal stump, distal anastomotic ring.  Patient up to  Anemia.  Most likely iron deficiency and chronic disease in the setting of acute blood loss.  Gave IV iron 6/13 since enteral route not an option.  Shorten interval of hydromorphone since no oral options.  Robaxin.  Ice/heat.    VTE prophylaxis- SCDs, enoxaparin, etc.  mobilize as tolerated to help recovery.  Despite her distention and vomiting she has been  mobilizing more.  Hopefully that will help the bowels open sooner   Disposition:  Disposition:  The patient is from: Home  Anticipate discharge to:  Home  Anticipated Date of Discharge is:  June 17,2023     Barriers to discharge:  Pending Clinical improvement (more likely than not)  Patient currently is NOT MEDICALLY STABLE for discharge from the hospital from a surgery standpoint.      I reviewed nursing notes, last 24 h vitals and pain scores, last 48 h intake and output, last 24 h labs and trends, and last 24 h imaging results. I have reviewed this patient's available data, including medical history, events of note, test results, etc as part of my evaluation.  A significant portion of that time was spent in counseling.  Care during the described time interval was provided by me.  This care required moderate level of medical decision making.  04/07/2022    Subjective: (Chief complaint)  Less sore Walking in hallways No nausea  Some flatus  Objective:  Vital signs:  Vitals:   04/06/22 1213 04/06/22 2111 04/07/22 0457 04/07/22 0522  BP: (!) 153/88 (!) 148/74  134/71  Pulse: 88 85  74  Resp: '15 18  18  '$ Temp: 99.1 F (37.3 C) 99 F (37.2 C)  98.5 F (36.9 C)  TempSrc: Oral Oral  Oral  SpO2: 96% 91%  93%  Weight:   85.8 kg   Height:        Last BM Date : 03/31/22  Intake/Output   Yesterday:  06/14 0701 - 06/15 0700 In: 927.2 [P.O.:90; I.V.:587.2; IV Piggyback:250] Out: 1220 [Urine:850; Emesis/NG output:300; Drains:70] This shift:  No intake/output data recorded.  Bowel function:  Flatus: No  BM:  No  Drain: Serosanguinous NGT: light bilous effluent - thinner   Physical Exam:  General: Pt awake/alert in no acute distress.  Not toxic or sickly.  Tired Eyes: PERRL, normal EOM.  Sclera clear.  No icterus Neuro: CN II-XII intact w/o focal sensory/motor deficits. Lymph: No head/neck/groin lymphadenopathy Psych:  No delerium/psychosis/paranoia.  Oriented x 4 HENT: Normocephalic, Mucus membranes moist.  No thrush Neck: Supple, No tracheal deviation.  No obvious thyromegaly Chest: No pain to chest wall compression.  Good respiratory excursion.  No audible  wheezing CV:  Pulses intact.  Regular rhythm.  No major extremity edema MS: Normal AROM mjr joints.  No obvious deformity  Abdomen: Soft.  Mildy distended.  Tenderness at drain site and old colostomy incision only.  Old colostomy wound with clean granulation.  No dehiscence or abscess.  Drain thinly serosanguineous.  No succus bilious or feculent changes.. Mild vague abdominal discomfort but no guarding or rebound tenderness.  No evidence of peritonitis.  No incarcerated hernias.  Ext:   No deformity.  No mjr edema.  No cyanosis Skin: No petechiae / purpurea.  No major sores.  Warm and dry    Results:   Cultures: No results found for this or any previous visit (from the past 720 hour(s)).  Labs: Results for orders placed or performed during the hospital encounter of 04/01/22 (from the past 48 hour(s))  CBC     Status: Abnormal   Collection Time: 04/06/22  4:56 AM  Result Value Ref Range   WBC 15.7 (H) 4.0 - 10.5 K/uL   RBC 2.99 (L) 3.87 - 5.11 MIL/uL   Hemoglobin 7.4 (L) 12.0 - 15.0 g/dL   HCT 24.4 (L) 36.0 - 46.0 %   MCV 81.6 80.0 - 100.0 fL  MCH 24.7 (L) 26.0 - 34.0 pg   MCHC 30.3 30.0 - 36.0 g/dL   RDW 18.4 (H) 11.5 - 15.5 %   Platelets 390 150 - 400 K/uL   nRBC 1.0 (H) 0.0 - 0.2 %    Comment: Performed at [facility], [place] [address] [place], [place] [address]  Basic metabolic panel     Status: None   Collection Time: 04/06/22  4:56 AM  Result Value Ref Range   Sodium 142 135 - 145 mmol/L   Potassium 4.1 3.5 - 5.1 mmol/L   Chloride 105 98 - 111 mmol/L   CO2 27 22 - 32 mmol/L   Glucose, Bld 98 70 - 99 mg/dL    Comment: Glucose reference range applies only to samples taken after fasting for at least 8 hours.   BUN 17 8 - 23 mg/dL   Creatinine, Ser 0.89 0.44 - 1.00 mg/dL   Calcium 9.1 8.9 - 10.3 mg/dL   GFR, Estimated >60 >60 mL/min    Comment: (NOTE) Calculated using the CKD-EPI Creatinine Equation (2021)    Anion gap 10 5 - 15    Comment:  Performed at [facility], [place] [address] [place], [place] [address]  CBC     Status: Abnormal   Collection Time: 04/07/22  4:29 AM  Result Value Ref Range   WBC 15.2 (H) 4.0 - 10.5 K/uL   RBC 2.84 (L) 3.87 - 5.11 MIL/uL   Hemoglobin 7.1 (L) 12.0 - 15.0 g/dL   HCT 23.0 (L) 36.0 - 46.0 %   MCV 81.0 80.0 - 100.0 fL   MCH 25.0 (L) 26.0 - 34.0 pg   MCHC 30.9 30.0 - 36.0 g/dL   RDW 18.0 (H) 11.5 - 15.5 %   Platelets 405 (H) 150 - 400 K/uL   nRBC 1.6 (H) 0.0 - 0.2 %    Comment: Performed at [facility], [place] [address] [place] [address] [place], [place] [address]  Basic metabolic panel     Status: Abnormal   Collection Time: 04/07/22  4:29 AM  Result Value Ref Range   Sodium 143 135 - 145 mmol/L   Potassium 3.7 3.5 - 5.1 mmol/L   Chloride 108 98 - 111 mmol/L   CO2 28 22 - 32 mmol/L   Glucose, Bld 95 70 - 99 mg/dL    Comment: Glucose reference range applies only to samples taken after fasting for at least 8 hours.   BUN 16 8 - 23 mg/dL   Creatinine, Ser 0.81 0.44 - 1.00 mg/dL   Calcium 8.7 (L) 8.9 - 10.3 mg/dL   GFR, Estimated >60 >60 mL/min    Comment: (NOTE) Calculated using the CKD-EPI Creatinine Equation (2021)    Anion gap 7 5 - 15    Comment: Performed at [facility], [place] [address] [place], [place] [address]    Imaging / Studies: No results found.  Medications / Allergies: per chart  Antibiotics: Anti-infectives (From admission, onward)    Start     Dose/Rate Route Frequency Ordered Stop   04/01/22 2200  cefoTEtan (CEFOTAN) 2 g in sodium chloride 0.9 % 100 mL IVPB        2 g 200 mL/hr over 30 Minutes Intravenous Every 12 hours 04/01/22 1714 04/01/22 2316   04/01/22 1400  neomycin (MYCIFRADIN) tablet 1,000 mg  Status:  Discontinued       See Hyperspace for full Linked Orders Report.   1,000 mg Oral 3 times per  day 04/01/22 0756 04/01/22 0807   04/01/22 1400  metroNIDAZOLE (FLAGYL) tablet 1,000 mg  Status:  Discontinued        See Hyperspace for full Linked Orders Report.   1,000 mg Oral 3 times per day 04/01/22 0756 04/01/22 0807   04/01/22 0801  sodium chloride 0.9 % with cefoTEtan (CEFOTAN) ADS Med       Note to Pharmacy: Kyra Leyland E: cabinet override      04/01/22 0801 04/01/22 1015   04/01/22 0800  cefoTEtan (CEFOTAN) 2 g in sodium chloride 0.9 % 100 mL IVPB        2 g 200 mL/hr over 30 Minutes Intravenous On call to O.R. 04/01/22 0756 04/01/22 1030         Note: Portions of this report may have been transcribed using voice recognition software. Every effort was made to ensure accuracy; however, inadvertent computerized transcription errors may be present.   Any transcriptional errors that result from this process are unintentional.    Adin Hector, MD, FACS, MASCRS Esophageal, Gastrointestinal & Colorectal Surgery Robotic and Minimally Invasive Surgery  [facility], [place]  [place]. [address], [place], [place] [address] [phone] Fax [phone] Main  CONTACT INFORMATION:  Weekday (9AM-5PM): Call [facility] main office at [phone]  Weeknight (5PM-9AM) or Weekend/Holiday: Check [email or web address] (password " [email or web address]") for General Surgery [facility] coverage  (Please, do not use SecureChat as it is not reliable communication to operating surgeons for immediate patient care)      04/07/2022  7:02 AM

## 2022-04-07 NOTE — Care Management Important Message (Signed)
Important Message  Patient Details IM Letter given to the Patient Name: Regina Roberts MRN: [id number] Date of Birth: January 29, 1951   Medicare Important Message Given:  Yes     Kerin Salen 04/07/2022, 1:30 PM

## 2022-04-08 MED ORDER — LACTATED RINGERS IV BOLUS: 1000.0000 mL | Freq: Three times a day (TID) | INTRAVENOUS | Status: AC | PRN

## 2022-04-08 MED ORDER — METHOCARBAMOL 500 MG PO TABS: 1000.0000 mg | ORAL_TABLET | Freq: Four times a day (QID) | ORAL | Status: DC | PRN

## 2022-04-08 MED ORDER — ACETAMINOPHEN 500 MG PO TABS
1000.0000 mg | ORAL_TABLET | Freq: Four times a day (QID) | ORAL | Status: DC
  Administered 2022-04-08 – 2022-04-18 (×22): 1000 mg via ORAL
  Filled 2022-04-08 (×29): qty 2

## 2022-04-08 MED ORDER — CALCIUM POLYCARBOPHIL 625 MG PO TABS
625.0000 mg | ORAL_TABLET | Freq: Two times a day (BID) | ORAL | Status: DC
  Administered 2022-04-08 – 2022-04-10 (×5): 625 mg via ORAL
  Filled 2022-04-08 (×6): qty 1

## 2022-04-08 NOTE — Plan of Care (Signed)
  Problem: Education: Goal: Knowledge of General Education information will improve Description Including pain rating scale, medication(s)/side effects and non-pharmacologic comfort measures Outcome: Progressing   Problem: Health Behavior/Discharge Planning: Goal: Ability to manage health-related needs will improve Outcome: Progressing   

## 2022-04-08 NOTE — Progress Notes (Signed)
Regina Roberts [id number] Feb 01, 1951  CARE TEAM:  PCP: Cassandria Anger, MD  Outpatient Care Team: Patient Care Team: Plotnikov, Evie Lacks, MD as PCP - General Fontaine, Belinda Block, MD (Inactive) as Consulting Physician (Gynecology) Pyrtle, Lajuan Lines, MD as Consulting Physician (Gastroenterology) Clovis Riley, MD as Consulting Physician (General Surgery) Rutherford Guys, MD as Consulting Physician (Ophthalmology) Michael Boston, MD as Consulting Physician (Colon and Rectal Surgery)  Inpatient Treatment Team: Treatment Team: Attending Provider: Michael Boston, MD; Registered Nurse: Roselind Rily, RN; Technician: Abbe Amsterdam, NT; Registered Nurse: Anda Kraft, RN   Problem List:   Principal Problem:   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 Active Problems:   Dyslipidemia   Essential hypertension   Obesity, Class II, BMI 35-39.9   Raynaud phenomenon   Allergic rhinitis   GERD (gastroesophageal reflux disease)   History of diverticulitis of colon   CKD (chronic kidney disease) stage 2, GFR 60-89 ml/min   7 Days Post-Op  04/01/2022  POST-OPERATIVE DIAGNOSIS:   HISTORY OF PERFORATED DIVERTICULITIS S/P HARTMANN RESECTION (COLECTOMY/COLOSTOMY) CECUM-to RECTAL STUMP FISTULA INCOMPLETE RECTOSIGMOID STUMP-to-VAGINAL CUFF FISTULA DESIRE FOR OSTOMY TAKEDOWN   PROCEDURE:   ROBOTIC COLOSTOMY TAKEDOWN, LOW ANTERIOR RECTOSIGMOID RESECTION TAKEDOWN OF CECUM-to RECTAL STUMP FISTULA TAKEDOWN OF RECTOSIGMOID STUMP-to-VAGINAL CUFF FISTULA  ROBOTIC LYSIS OF ADHESIONS x 2.5 hours ILEAL REPAIR RIGID PROCTOSCOPY TRANSVERSUS ABDOMINIS PLANE (TAP) BLOCK - BILATERAL   SURGEON:  Michael Boston, MD, FACS  OR FINDINGS:  Patient had moderately dense adhesions of omentum to anterior wall as well as small bowel adhesions and specially down the pelvis.  Patient had very dense adhesions between the proximal right corner of the rectosigmoid stump to the posterior cecal wall  that correlated with the cecum to rectosigmoid stump fistula.  Patient had rectosigmoid densely adherent to the vaginal cuff with a pocket suspicious for either nearly resolved abscess or possible and complete fistula from rectal stump to the vaginal cuff.  Both areas repaired with absorbable serrated V-Loc suture in a running fashion on the cecum and vaginal cuff.  Rectosigmoid resection done of the 2 potential fistulous connections.     It is a and descending colon to and proximal rectum 29 EEA stapled anastomosis.  Anastomosis rests 12 cm from the anal verge.    Assessment  Ileus resolving after prolonged lysis adhesions and takedown of rectal stump fistulas to cecum & vagina.  [facility] Stay = 7 days)  Plan:  Expected postoperative delay to return of bowel function given prolonged lysis of adhesions and extensive surgery.  NGT to LIWS.  Scheduled nausea control.  Output less & more thin with improved Sx hopeful signs that ileus will resolve in next few days  NGT d/c'd by me  Adv diet to Dys1/fulls.  Try and restart some oral medications gradually  IV fluids with as needed backup.  Hold off on TPN unless ileus persists by postop day 7 - hopefully   History of chronic kidney disease.  No oliguria and creatinine stable.  Follow closely.  Gently rehydrate   Hypertension.  Use as needed medications for now and follow   Pathology shows inflammation but no cancer or tumor or other surprises at the colostomy, rectal stump, distal anastomotic ring.    Anemia.  Most likely iron deficiency and chronic disease in the setting of acute blood loss.  Gave IV iron 6/13 since enteral route not an option.  Shorten interval of hydromorphone since no oral options.  Robaxin.  Ice/heat.  VTE prophylaxis- SCDs, enoxaparin, etc.  mobilize as tolerated to help recovery.  Despite her distention and vomiting she has been mobilizing more.  Hopefully that will help the bowels open sooner   Disposition:   Disposition:  The patient is from: Home  Anticipate discharge to:  Home  Anticipated Date of Discharge is:  June 17,2023    Barriers to discharge:  Pending Clinical improvement (more likely than not)  Patient currently is NOT MEDICALLY STABLE for discharge from the hospital from a surgery standpoint.      I reviewed nursing notes, last 24 h vitals and pain scores, last 48 h intake and output, last 24 h labs and trends, and last 24 h imaging results. I have reviewed this patient's available data, including medical history, events of note, test results, etc as part of my evaluation.  A significant portion of that time was spent in counseling.  Care during the described time interval was provided by me.  This care required moderate level of medical decision making.  04/08/2022    Subjective: (Chief complaint)  Patient feeling better.  Pass gas with bowel movements.  Tolerated clamping with clears.  No nausea or vomiting.  Low residuals.  In good spirits and very appreciative of nursing and surgery care  Objective:  Vital signs:  Vitals:   04/07/22 1300 04/07/22 2209 04/08/22 0429 04/08/22 0535  BP: 135/69 (!) 149/76  (!) 145/87  Pulse: 78 78  88  Resp: '18 18  18  '$ Temp: 98.6 F (37 C) 99.1 F (37.3 C)  98.9 F (37.2 C)  TempSrc: Oral Oral  Oral  SpO2: 96% 93%  96%  Weight:   86.4 kg   Height:        Last BM Date : 03/31/22  Intake/Output   Yesterday:  06/15 0701 - 06/16 0700 In: 2300.6 [P.O.:510; I.V.:1490.6; IV Piggyback:300] Out: 812 [Urine:800; Emesis/NG output:10; Drains:2] This shift:  No intake/output data recorded.  Bowel function:  Flatus: No  BM:  No  NGT: light bilous effluent - thin   Physical Exam:  General: Pt awake/alert in no acute distress.  Not toxic or sickly.  Tired Eyes: PERRL, normal EOM.  Sclera clear.  No icterus Neuro: CN II-XII intact w/o focal sensory/motor deficits. Lymph: No head/neck/groin lymphadenopathy Psych:  No  delerium/psychosis/paranoia.  Oriented x 4 HENT: Normocephalic, Mucus membranes moist.  No thrush Neck: Supple, No tracheal deviation.  No obvious thyromegaly Chest: No pain to chest wall compression.  Good respiratory excursion.  No audible wheezing CV:  Pulses intact.  Regular rhythm.  No major extremity edema MS: Normal AROM mjr joints.  No obvious deformity  Abdomen: Soft.  Mildy distended.  Tenderness at drain site and old colostomy incision only.  Old colostomy wound with clean granulation.  No dehiscence or abscess.  Drain thinly serosanguineous.  No succus bilious or feculent changes.. Mild vague abdominal discomfort but no guarding or rebound tenderness.  No evidence of peritonitis.  No incarcerated hernias.  Ext:   No deformity.  No mjr edema.  No cyanosis Skin: No petechiae / purpurea.  No major sores.  Warm and dry    Results:   Cultures: No results found for this or any previous visit (from the past 720 hour(s)).  Labs: Results for orders placed or performed during the hospital encounter of 04/01/22 (from the past 48 hour(s))  CBC     Status: Abnormal   Collection Time: 04/07/22  4:29 AM  Result Value Ref Range  WBC 15.2 (H) 4.0 - 10.5 K/uL   RBC 2.84 (L) 3.87 - 5.11 MIL/uL   Hemoglobin 7.1 (L) 12.0 - 15.0 g/dL   HCT 23.0 (L) 36.0 - 46.0 %   MCV 81.0 80.0 - 100.0 fL   MCH 25.0 (L) 26.0 - 34.0 pg   MCHC 30.9 30.0 - 36.0 g/dL   RDW 18.0 (H) 11.5 - 15.5 %   Platelets 405 (H) 150 - 400 K/uL   nRBC 1.6 (H) 0.0 - 0.2 %    Comment: Performed at [facility], [place] [address], [place], [place] [address]  Basic metabolic panel     Status: Abnormal   Collection Time: 04/07/22  4:29 AM  Result Value Ref Range   Sodium 143 135 - 145 mmol/L   Potassium 3.7 3.5 - 5.1 mmol/L   Chloride 108 98 - 111 mmol/L   CO2 28 22 - 32 mmol/L   Glucose, Bld 95 70 - 99 mg/dL    Comment: Glucose reference range applies only to samples taken after fasting for at least 8  hours.   BUN 16 8 - 23 mg/dL   Creatinine, Ser 0.81 0.44 - 1.00 mg/dL   Calcium 8.7 (L) 8.9 - 10.3 mg/dL   GFR, Estimated >60 >60 mL/min    Comment: (NOTE) Calculated using the CKD-EPI Creatinine Equation (2021)    Anion gap 7 5 - 15    Comment: Performed at [facility], [place] [address] [place], [place] [address]    Imaging / Studies: No results found.  Medications / Allergies: per chart  Antibiotics: Anti-infectives (From admission, onward)    Start     Dose/Rate Route Frequency Ordered Stop   04/01/22 2200  cefoTEtan (CEFOTAN) 2 g in sodium chloride 0.9 % 100 mL IVPB        2 g 200 mL/hr over 30 Minutes Intravenous Every 12 hours 04/01/22 1714 04/01/22 2316   04/01/22 1400  neomycin (MYCIFRADIN) tablet 1,000 mg  Status:  Discontinued       See Hyperspace for full Linked Orders Report.   1,000 mg Oral 3 times per day 04/01/22 0756 04/01/22 0807   04/01/22 1400  metroNIDAZOLE (FLAGYL) tablet 1,000 mg  Status:  Discontinued       See Hyperspace for full Linked Orders Report.   1,000 mg Oral 3 times per day 04/01/22 0756 04/01/22 0807   04/01/22 0801  sodium chloride 0.9 % with cefoTEtan (CEFOTAN) ADS Med       Note to Pharmacy: Kyra Leyland E: cabinet override      04/01/22 0801 04/01/22 1015   04/01/22 0800  cefoTEtan (CEFOTAN) 2 g in sodium chloride 0.9 % 100 mL IVPB        2 g 200 mL/hr over 30 Minutes Intravenous On call to O.R. 04/01/22 0756 04/01/22 1030         Note: Portions of this report may have been transcribed using voice recognition software. Every effort was made to ensure accuracy; however, inadvertent computerized transcription errors may be present.   Any transcriptional errors that result from this process are unintentional.    Adin Hector, MD, FACS, MASCRS Esophageal, Gastrointestinal & Colorectal Surgery Robotic and Minimally Invasive Surgery  [facility], [place]. [address], [place], [place] [address] [phone] Fax [phone] Main  CONTACT INFORMATION:  Weekday (9AM-5PM): Call [facility] main office at [phone]  Weeknight (5PM-9AM) or Weekend/Holiday:  Check [email or web address] (password " [email or web address]") for General Surgery [facility] coverage  (Please, do not use SecureChat as it is not reliable communication to operating surgeons for immediate patient care)      04/08/2022  7:03 AM

## 2022-04-09 NOTE — Progress Notes (Signed)
Regina Roberts [id number] [birth year]  CARE TEAM:  PCP: Cassandria Anger, MD  Outpatient Care Team: Patient Care Team: Plotnikov, Evie Lacks, MD as PCP - General Fontaine, Belinda Block, MD (Inactive) as Consulting Physician (Gynecology) Pyrtle, Lajuan Lines, MD as Consulting Physician (Gastroenterology) Clovis Riley, MD as Consulting Physician (General Surgery) Rutherford Guys, MD as Consulting Physician (Ophthalmology) Michael Boston, MD as Consulting Physician (Colon and Rectal Surgery)  Inpatient Treatment Team: Treatment Team: Attending Provider: Michael Boston, MD; Utilization Review: Alease Medina, RN; Registered Nurse: Warren Lacy, RN; Technician: Wright, [place] E, NT; Pharmacist: Dimple Nanas, [name]   Problem List:   Principal Problem:   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 Active Problems:   Dyslipidemia   Essential hypertension   Obesity, Class II, BMI 35-39.9   Raynaud phenomenon   Allergic rhinitis   GERD (gastroesophageal reflux disease)   History of diverticulitis of colon   CKD (chronic kidney disease) stage 2, GFR 60-89 ml/min   8 Days Post-Op  04/01/2022  POST-OPERATIVE DIAGNOSIS:   HISTORY OF PERFORATED DIVERTICULITIS S/P HARTMANN RESECTION (COLECTOMY/COLOSTOMY) CECUM-to RECTAL STUMP FISTULA INCOMPLETE RECTOSIGMOID STUMP-to-VAGINAL CUFF FISTULA DESIRE FOR OSTOMY TAKEDOWN   PROCEDURE:   ROBOTIC COLOSTOMY TAKEDOWN, LOW ANTERIOR RECTOSIGMOID RESECTION TAKEDOWN OF CECUM-to RECTAL STUMP FISTULA TAKEDOWN OF RECTOSIGMOID STUMP-to-VAGINAL CUFF FISTULA  ROBOTIC LYSIS OF ADHESIONS x 2.5 hours ILEAL REPAIR RIGID PROCTOSCOPY TRANSVERSUS ABDOMINIS PLANE (TAP) BLOCK - BILATERAL   SURGEON:  Michael Boston, MD, FACS  OR FINDINGS:  Patient had moderately dense adhesions of omentum to anterior wall as well as small bowel adhesions and specially down the pelvis.  Patient had very dense adhesions between the proximal right corner of the rectosigmoid  stump to the posterior cecal wall that correlated with the cecum to rectosigmoid stump fistula.  Patient had rectosigmoid densely adherent to the vaginal cuff with a pocket suspicious for either nearly resolved abscess or possible and complete fistula from rectal stump to the vaginal cuff.  Both areas repaired with absorbable serrated V-Loc suture in a running fashion on the cecum and vaginal cuff.  Rectosigmoid resection done of the 2 potential fistulous connections.     It is a and descending colon to and proximal rectum 29 EEA stapled anastomosis.  Anastomosis rests 12 cm from the anal verge.    Assessment  Ileus resolving after prolonged lysis adhesions and takedown of rectal stump fistulas to cecum & vagina.  [facility] Stay = 8 days)  Plan:  Expected postoperative delay to return of bowel function given prolonged lysis of adhesions and extensive surgery.  NG was removed 6/16, monitor today.  Scheduled nausea control.    Full liquids as tolerated.  IV fluids with as needed backup.  Hold off on TPN unless ileus persists by postop day 7 - hopefully   History of chronic kidney disease.  No oliguria and creatinine stable.  Follow closely.  Gently rehydrate   Hypertension.  Use as needed medications for now and follow   Pathology shows inflammation but no cancer or tumor or other surprises at the colostomy, rectal stump, distal anastomotic ring.    Anemia.  Most likely iron deficiency and chronic disease in the setting of acute blood loss.  Gave IV iron 6/13 since enteral route not an option.  Shorten interval of hydromorphone since no oral options.  Robaxin.  Ice/heat.    VTE prophylaxis- SCDs, enoxaparin, etc.  mobilize as tolerated to help recovery.    Disposition:  Disposition:  The patient is from:  Home  Anticipate discharge to:  Home  Anticipated Date of Discharge is:  June 17,2023    Barriers to discharge:  Pending Clinical improvement (more likely than  not)  Patient currently is NOT MEDICALLY STABLE for discharge from the hospital from a surgery standpoint.      I reviewed nursing notes, last 24 h vitals and pain scores, last 48 h intake and output, last 24 h labs and trends, and last 24 h imaging results. I have reviewed this patient's available data, including medical history, events of note, test results, etc as part of my evaluation.  A significant portion of that time was spent in counseling.  Care during the described time interval was provided by me.  This care required moderate level of medical decision making.  04/09/2022    Subjective: (Chief complaint)  Patient feeling better each day  Pass gas with bowel movements.  Some bloating but no n/v with liquids.  In good spirits and very appreciative of nursing and surgery care  Objective:  Vital signs:  Vitals:   04/08/22 1400 04/08/22 2056 04/09/22 0500 04/09/22 0627  BP: (!) 157/76 (!) 162/88  (!) 143/74  Pulse: 96 89  80  Resp: '16 16  15  '$ Temp: 98.6 F (37 C) 98.5 F (36.9 C)  97.8 F (36.6 C)  TempSrc:  Oral  Oral  SpO2: 98% 96%  98%  Weight:   85.9 kg   Height:        Last BM Date : 04/07/22  Intake/Output   Yesterday:  06/16 0701 - 06/17 0700 In: 1579.1 [P.O.:660; I.V.:719.1; IV Piggyback:200] Out: 1350 [Urine:1350] This shift:  No intake/output data recorded.  Bowel function:  Flatus: No  BM:  No  NGT: light bilous effluent - thin   Physical Exam:  General: Pt awake/alert in no acute distress Eyes: Sclera clear.  No icterus Neuro: CN II-XII intact w/o focal sensory/motor deficits. Lymph: No head/neck/groin lymphadenopathy Psych:  No delerium/psychosis/paranoia.  Oriented x 4 HENT: Normocephalic, Mucus membranes moist.  No thrush Neck: Supple, No tracheal deviation.  No obvious thyromegaly Chest: No pain to chest wall compression.  Good respiratory excursion.  No audible wheezing CV:  Pulses intact.  Regular rhythm.  No major  extremity edema MS: Normal AROM mjr joints.  No obvious deformity  Abdomen: Soft.  Mildy distended.  Tenderness at drain site and old colostomy incision only.  Old colostomy wound with clean granulation.  No dehiscence or abscess.  Drain thinly serosanguineous.  No succus bilious or feculent changes.. Mild vague abdominal discomfort but no guarding or rebound tenderness.  No evidence of peritonitis.  No incarcerated hernias.  Ext:   No deformity.  No mjr edema.  No cyanosis Skin: No petechiae / purpurea.  No major sores.  Warm and dry    Results:   Cultures: No results found for this or any previous visit (from the past 720 hour(s)).  Labs: No results found for this or any previous visit (from the past 48 hour(s)).   Imaging / Studies: No results found.  Medications / Allergies: per chart  Antibiotics: Anti-infectives (From admission, onward)    Start     Dose/Rate Route Frequency Ordered Stop   04/01/22 2200  cefoTEtan (CEFOTAN) 2 g in sodium chloride 0.9 % 100 mL IVPB        2 g 200 mL/hr over 30 Minutes Intravenous Every 12 hours 04/01/22 1714 04/01/22 2316   04/01/22 1400  neomycin (MYCIFRADIN) tablet 1,000 mg  Status:  Discontinued       See Hyperspace for full Linked Orders Report.   1,000 mg Oral 3 times per day 04/01/22 0756 04/01/22 0807   04/01/22 1400  metroNIDAZOLE (FLAGYL) tablet 1,000 mg  Status:  Discontinued       See Hyperspace for full Linked Orders Report.   1,000 mg Oral 3 times per day 04/01/22 0756 04/01/22 0807   04/01/22 0801  sodium chloride 0.9 % with cefoTEtan (CEFOTAN) ADS Med       Note to Pharmacy: Kyra Leyland E: cabinet override      04/01/22 0801 04/01/22 1015   04/01/22 0800  cefoTEtan (CEFOTAN) 2 g in sodium chloride 0.9 % 100 mL IVPB        2 g 200 mL/hr over 30 Minutes Intravenous On call to O.R. 04/01/22 0756 04/01/22 1030         Note: Portions of this report may have been transcribed using voice recognition software. Every  effort was made to ensure accuracy; however, inadvertent computerized transcription errors may be present.   Any transcriptional errors that result from this process are unintentional.    Nadeen Landau, MD [facility], [facility]       04/09/2022  9:00 AM

## 2022-04-09 NOTE — Plan of Care (Signed)

## 2022-04-10 ENCOUNTER — Inpatient Hospital Stay (HOSPITAL_COMMUNITY): Payer: Medicare Other

## 2022-04-10 MED ORDER — METOCLOPRAMIDE HCL 5 MG/ML IJ SOLN
10.0000 mg | Freq: Four times a day (QID) | INTRAMUSCULAR | Status: DC
  Administered 2022-04-10 – 2022-04-12 (×8): 10 mg via INTRAVENOUS
  Filled 2022-04-10 (×8): qty 2

## 2022-04-10 NOTE — Progress Notes (Signed)
9 Days Post-Op   Subjective/Chief Complaint: Patient had a BM yesterday, but also two episodes of vomiting NG placement was unsuccessful - gross bleeding Still feeling nauseated this morning   Objective: Vital signs in last 24 hours: Temp:  [98.7 F (37.1 C)-98.8 F (37.1 C)] 98.7 F (37.1 C) (06/18 0556) Pulse Rate:  [82-93] 92 (06/18 0556) Resp:  [16-22] 22 (06/18 0556) BP: (163-172)/(86-95) 169/88 (06/18 0556) SpO2:  [98 %-99 %] 98 % (06/18 0556) Weight:  [85.5 kg] 85.5 kg (06/18 0500) Last BM Date : 04/09/22  Intake/Output from previous day: 06/17 0701 - 06/18 0700 In: 395.8 [P.O.:150; I.V.:96; IV Piggyback:149.8] Out: 300 [Urine:300] Intake/Output this shift: Total I/O In: -  Out: 200 [Emesis/NG output:200]  Soft, mildly distended Old colostomy wound - granulating; minimal drainage Mild tenderness around wound   Studies/Results: No results found.  Anti-infectives: Anti-infectives (From admission, onward)    Start     Dose/Rate Route Frequency Ordered Stop   04/01/22 2200  cefoTEtan (CEFOTAN) 2 g in sodium chloride 0.9 % 100 mL IVPB        2 g 200 mL/hr over 30 Minutes Intravenous Every 12 hours 04/01/22 1714 04/01/22 2316   04/01/22 1400  neomycin (MYCIFRADIN) tablet 1,000 mg  Status:  Discontinued       See Hyperspace for full Linked Orders Report.   1,000 mg Oral 3 times per day 04/01/22 0756 04/01/22 0807   04/01/22 1400  metroNIDAZOLE (FLAGYL) tablet 1,000 mg  Status:  Discontinued       See Hyperspace for full Linked Orders Report.   1,000 mg Oral 3 times per day 04/01/22 0756 04/01/22 0807   04/01/22 0801  sodium chloride 0.9 % with cefoTEtan (CEFOTAN) ADS Med       Note to Pharmacy: Kyra Leyland E: cabinet override      04/01/22 0801 04/01/22 1015   04/01/22 0800  cefoTEtan (CEFOTAN) 2 g in sodium chloride 0.9 % 100 mL IVPB        2 g 200 mL/hr over 30 Minutes Intravenous On call to O.R. 04/01/22 0756 04/01/22 1030        Assessment/Plan: Prolonged post-operative ileus after extensive lysis of adhesions Change Reglan to scheduled 10 mg IV q6 Back off to clear liquids Plain films today  LOS: 9 days    Regina Roberts 04/10/2022

## 2022-04-10 NOTE — Progress Notes (Signed)
Patient had two large emesis occurrences. On call MD White notified and placed order for NG tube. This RN and charge RN dropped tube and placed to suction, bright red blood found in tube. Pt stated something did not feel right and tube was pulled. MD notified and stated to try again if patient has another emesis occurrence.

## 2022-04-11 LAB — BASIC METABOLIC PANEL
Anion gap: 11 (ref 5–15)
BUN: 13 mg/dL (ref 8–23)
CO2: 24 mmol/L (ref 22–32)
Calcium: 8.2 mg/dL — ABNORMAL LOW (ref 8.9–10.3)
Chloride: 109 mmol/L (ref 98–111)
Creatinine, Ser: 0.78 mg/dL (ref 0.44–1.00)
GFR, Estimated: >60 mL/min (ref >60)
Glucose, Bld: 102 mg/dL — ABNORMAL HIGH (ref 70–99)
Potassium: 3.1 mmol/L — ABNORMAL LOW (ref 3.5–5.1)
Sodium: 144 mmol/L (ref 135–145)

## 2022-04-11 LAB — CBC
HCT: 23.2 % — ABNORMAL LOW (ref 36.0–46.0)
Hemoglobin: 7.4 g/dL — ABNORMAL LOW (ref 12.0–15.0)
MCH: 25.3 pg — ABNORMAL LOW (ref 26.0–34.0)
MCHC: 31.9 g/dL (ref 30.0–36.0)
MCV: 79.2 fL — ABNORMAL LOW (ref 80.0–100.0)
Platelets: 507 10*3/uL — ABNORMAL HIGH (ref 150–400)
RBC: 2.93 MIL/uL — ABNORMAL LOW (ref 3.87–5.11)
RDW: 18.9 % — ABNORMAL HIGH (ref 11.5–15.5)
WBC: 21.6 10*3/uL — ABNORMAL HIGH (ref 4.0–10.5)
nRBC: 0.5 % — ABNORMAL HIGH (ref 0.0–0.2)

## 2022-04-11 LAB — MAGNESIUM: Magnesium: 2.2 mg/dL (ref 1.7–2.4)

## 2022-04-11 MED ORDER — ASCORBIC ACID 500 MG PO TABS
500.0000 mg | ORAL_TABLET | Freq: Two times a day (BID) | ORAL | Status: DC
  Administered 2022-04-11 – 2022-04-13 (×5): 500 mg via ORAL
  Filled 2022-04-11 (×6): qty 1

## 2022-04-11 MED ORDER — SIMETHICONE 40 MG/0.6ML PO SUSP
80.0000 mg | Freq: Four times a day (QID) | ORAL | Status: DC | PRN
  Administered 2022-04-13: 80 mg via ORAL
  Filled 2022-04-11 (×2): qty 1.2

## 2022-04-11 MED ORDER — TAB-A-VITE/IRON PO TABS
1.0000 | ORAL_TABLET | Freq: Every day | ORAL | Status: DC
  Filled 2022-04-11 (×2): qty 1

## 2022-04-11 MED ORDER — BOOST PLUS PO LIQD
237.0000 mL | Freq: Two times a day (BID) | ORAL | Status: DC
  Administered 2022-04-11 – 2022-04-18 (×10): 237 mL via ORAL
  Filled 2022-04-11 (×16): qty 237

## 2022-04-11 MED ORDER — PANTOPRAZOLE SODIUM 40 MG PO TBEC
40.0000 mg | DELAYED_RELEASE_TABLET | Freq: Every day | ORAL | Status: DC
  Administered 2022-04-11 – 2022-04-18 (×8): 40 mg via ORAL
  Filled 2022-04-11 (×8): qty 1

## 2022-04-11 MED ORDER — POTASSIUM CHLORIDE 10 MEQ/100ML IV SOLN
10.0000 meq | INTRAVENOUS | Status: AC
  Administered 2022-04-11 (×4): 10 meq via INTRAVENOUS
  Filled 2022-04-11 (×4): qty 100

## 2022-04-11 MED ORDER — POLYETHYLENE GLYCOL 3350 17 G PO PACK
17.0000 g | PACK | Freq: Two times a day (BID) | ORAL | Status: DC
  Filled 2022-04-11 (×2): qty 1

## 2022-04-11 NOTE — Progress Notes (Addendum)
Regina Roberts [id number] Mar 11, 1951  CARE TEAM:  PCP: Cassandria Anger, MD  Outpatient Care Team: Patient Care Team: Plotnikov, Evie Lacks, MD as PCP - General Fontaine, Belinda Block, MD (Inactive) as Consulting Physician (Gynecology) Pyrtle, Lajuan Lines, MD as Consulting Physician (Gastroenterology) Clovis Riley, MD as Consulting Physician (General Surgery) Rutherford Guys, MD as Consulting Physician (Ophthalmology) Michael Boston, MD as Consulting Physician (Colon and Rectal Surgery)  Inpatient Treatment Team: Treatment Team: Attending Provider: Michael Boston, MD; Registered Nurse: Tad Moore, RN; Technician: Constance Goltz, NT; Registered Nurse: Aura Dials, RN; Pharmacist: Eudelia Bunch, [facility]; Utilization Review: Barbara Cower, RN   Problem List:   Principal Problem:   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 Active Problems:   Dyslipidemia   Essential hypertension   Obesity, Class II, BMI 35-39.9   Raynaud phenomenon   Allergic rhinitis   GERD (gastroesophageal reflux disease)   History of diverticulitis of colon   CKD (chronic kidney disease) stage 2, GFR 60-89 ml/min   10 Days Post-Op  04/01/2022  POST-OPERATIVE DIAGNOSIS:   HISTORY OF PERFORATED DIVERTICULITIS S/P HARTMANN RESECTION (COLECTOMY/COLOSTOMY) CECUM-to RECTAL STUMP FISTULA INCOMPLETE RECTOSIGMOID STUMP-to-VAGINAL CUFF FISTULA DESIRE FOR OSTOMY TAKEDOWN   PROCEDURE:   ROBOTIC COLOSTOMY TAKEDOWN, LOW ANTERIOR RECTOSIGMOID RESECTION TAKEDOWN OF CECUM-to RECTAL STUMP FISTULA TAKEDOWN OF RECTOSIGMOID STUMP-to-VAGINAL CUFF FISTULA  ROBOTIC LYSIS OF ADHESIONS x 2.5 hours ILEAL REPAIR RIGID PROCTOSCOPY TRANSVERSUS ABDOMINIS PLANE (TAP) BLOCK - BILATERAL   SURGEON:  Michael Boston, MD, FACS  OR FINDINGS:  Patient had moderately dense adhesions of omentum to anterior wall as well as small bowel adhesions and specially down the pelvis.  Patient had very dense adhesions between the proximal  right corner of the rectosigmoid stump to the posterior cecal wall that correlated with the cecum to rectosigmoid stump fistula.  Patient had rectosigmoid densely adherent to the vaginal cuff with a pocket suspicious for either nearly resolved abscess or possible and complete fistula from rectal stump to the vaginal cuff.  Both areas repaired with absorbable serrated V-Loc suture in a running fashion on the cecum and vaginal cuff.  Rectosigmoid resection done of the 2 potential fistulous connections.     It is a and descending colon to and proximal rectum 29 EEA stapled anastomosis.  Anastomosis rests 12 cm from the anal verge.    Assessment  Ileus   [facility] Stay = 10 days)  Plan:  Mixed picture with bowel movements and nausea vomiting over the weekend.  No nausea or vomiting last 24 hours.  Retry clears and advance to Dys1/fulls.  Get calorie counts.  Supplemental shakes.  If tolerates, slowly advance diet.  If not, start parenteral nutrition  Hypokalemia.  Correct and check magnesium.  White count elevated but this is the best her abdomen has been & she feels much better.  Incision looks fine.  Nontender.  More comfortable.  Hold off on CAT scan unless does not advance in the next 24 hours.  IV fluids with as needed backup.  History of chronic kidney disease.  No oliguria and creatinine stable.  Follow closely.  Gently rehydrate   Hypertension.  Use as needed medications for now and follow   Pathology shows inflammation but no cancer or tumor or other surprises at the colostomy, rectal stump, distal anastomotic ring.    Anemia.  Most likely iron deficiency and chronic disease in the setting of acute blood loss.  Gave IV iron 6/13 since enteral route not an option.  Shorten  interval of hydromorphone since no oral options.  Robaxin.  Ice/heat.    VTE prophylaxis- SCDs, enoxaparin, etc.  mobilize as tolerated to help recovery.  Try to encourage her more than just once a day in the  chair and once in the hallway.  Try and move around more.  Have physical Occupational Therapy evaluate to make sure there are no equipment or therapy concerns.  Disposition:  Disposition:  The patient is from: Home  Anticipate discharge to:  Home  Anticipated Date of Discharge is:  June 22,2023    Barriers to discharge:  Pending Clinical improvement (more likely than not)  Patient currently is NOT MEDICALLY STABLE for discharge from the hospital from a surgery standpoint.      I reviewed nursing notes, last 24 h vitals and pain scores, last 48 h intake and output, last 24 h labs and trends, and last 24 h imaging results. I have reviewed this patient's available data, including medical history, events of note, test results, etc as part of my evaluation.  A significant portion of that time was spent in counseling.  Care during the described time interval was provided by me.  This care required moderate level of medical decision making.  04/11/2022    Subjective: (Chief complaint)  Events noted over the weekend.  No nausea or vomiting past 24 hours on Reglan.  Not able to get NG tube and so held off.  X-rays show ileus.  Tolerating some liquids but not much of an appetite.  Multiple bowel movements and feels more cleaned out.  Nurse Manuela Schwartz in room.  Denies any abdominal pain or discomfort.  Walked in hallways but does not like to get out of her bed much.  Objective:  Vital signs:  Vitals:   04/10/22 0556 04/10/22 1351 04/10/22 2039 04/11/22 0647  BP: (!) 169/88 (!) 159/84 (!) 152/81 (!) 165/97  Pulse: 92 93 87 96  Resp: (!) '22 14 18 18  '$ Temp: 98.7 F (37.1 C) 99.1 F (37.3 C) 99.5 F (37.5 C) 99 F (37.2 C)  TempSrc: Oral Oral Oral Oral  SpO2: 98% 99% 99% 97%  Weight:      Height:        Last BM Date : 04/11/22  Intake/Output   Yesterday:  06/18 0701 - 06/19 0700 In: 826.6 [P.O.:60; I.V.:666.6; IV Piggyback:100] Out: 1300 [Urine:1000; Emesis/NG  output:200; Stool:100] This shift:  Total I/O In: 199.9 [I.V.:199.9] Out: -   Bowel function:  Flatus: YES  BM:  YES  NGT: none   Physical Exam:  General: Pt awake/alert in no acute distress.  Not toxic or sickly.  Tired Eyes: PERRL, normal EOM.  Sclera clear.  No icterus Neuro: CN II-XII intact w/o focal sensory/motor deficits. Lymph: No head/neck/groin lymphadenopathy Psych:  No delerium/psychosis/paranoia.  Oriented x 4 HENT: Normocephalic, Mucus membranes moist.  No thrush Neck: Supple, No tracheal deviation.  No obvious thyromegaly Chest: No pain to chest wall compression.  Good respiratory excursion.  No audible wheezing CV:  Pulses intact.  Regular rhythm.  No major extremity edema MS: Normal AROM mjr joints.  No obvious deformity  Abdomen: Soft.  Nondistended.  Nontender.  No colostomy wound dry with old blood.  No evidence of peritonitis.  No incarcerated hernias.  Ext:   No deformity.  No mjr edema.  No cyanosis Skin: No petechiae / purpurea.  No major sores.  Warm and dry    Results:   Cultures: No results found for this or any previous  visit (from the past 720 hour(s)).  Labs: Results for orders placed or performed during the hospital encounter of 04/01/22 (from the past 48 hour(s))  CBC     Status: Abnormal   Collection Time: 04/11/22 12:29 AM  Result Value Ref Range   WBC 21.6 (H) 4.0 - 10.5 K/uL   RBC 2.93 (L) 3.87 - 5.11 MIL/uL   Hemoglobin 7.4 (L) 12.0 - 15.0 g/dL   HCT 23.2 (L) 36.0 - 46.0 %   MCV 79.2 (L) 80.0 - 100.0 fL   MCH 25.3 (L) 26.0 - 34.0 pg   MCHC 31.9 30.0 - 36.0 g/dL   RDW 18.9 (H) 11.5 - 15.5 %   Platelets 507 (H) 150 - 400 K/uL   nRBC 0.5 (H) 0.0 - 0.2 %    Comment: Performed at [name], [place] [address] [place], [place] [address]  Basic metabolic panel     Status: Abnormal   Collection Time: 04/11/22 12:29 AM  Result Value Ref Range   Sodium 144 135 - 145 mmol/L   Potassium 3.1 (L) 3.5 - 5.1 mmol/L    Chloride 109 98 - 111 mmol/L   CO2 24 22 - 32 mmol/L   Glucose, Bld 102 (H) 70 - 99 mg/dL    Comment: Glucose reference range applies only to samples taken after fasting for at least 8 hours.   BUN 13 8 - 23 mg/dL   Creatinine, Ser 0.78 0.44 - 1.00 mg/dL   Calcium 8.2 (L) 8.9 - 10.3 mg/dL   GFR, Estimated >60 >60 mL/min    Comment: (NOTE) Calculated using the CKD-EPI Creatinine Equation (2021)    Anion gap 11 5 - 15    Comment: Performed at [facility], [place] [address], [place], [place] [address]    Imaging / Studies: DG Abd Portable 2V  Result Date: 04/10/2022 CLINICAL DATA:  Ileus following gastrointestinal surgery. EXAM: PORTABLE ABDOMEN - 2 VIEW COMPARISON:  One-view abdomen 04/04/2022 FINDINGS: Mild gaseous distension of small bowel remains. Gas is present throughout the colon. No free air evident on supine scratched at no free air is present. Axial skeleton is within normal limits. IMPRESSION: Persistent mild gaseous distension of small bowel, compatible with ileus. Electronically Signed   By: San Morelle M.D.   On: 04/10/2022 12:13    Medications / Allergies: per chart  Antibiotics: Anti-infectives (From admission, onward)    Start     Dose/Rate Route Frequency Ordered Stop   04/01/22 2200  cefoTEtan (CEFOTAN) 2 g in sodium chloride 0.9 % 100 mL IVPB        2 g 200 mL/hr over 30 Minutes Intravenous Every 12 hours 04/01/22 1714 04/01/22 2316   04/01/22 1400  neomycin (MYCIFRADIN) tablet 1,000 mg  Status:  Discontinued       See Hyperspace for full Linked Orders Report.   1,000 mg Oral 3 times per day 04/01/22 0756 04/01/22 0807   04/01/22 1400  metroNIDAZOLE (FLAGYL) tablet 1,000 mg  Status:  Discontinued       See Hyperspace for full Linked Orders Report.   1,000 mg Oral 3 times per day 04/01/22 0756 04/01/22 0807   04/01/22 0801  sodium chloride 0.9 % with cefoTEtan (CEFOTAN) ADS Med       Note to Pharmacy: Kyra Leyland E: cabinet override       04/01/22 0801 04/01/22 1015   04/01/22 0800  cefoTEtan (CEFOTAN) 2 g in sodium chloride 0.9 % 100 mL IVPB  2 g 200 mL/hr over 30 Minutes Intravenous On call to O.R. 04/01/22 0756 04/01/22 1030         Note: Portions of this report may have been transcribed using voice recognition software. Every effort was made to ensure accuracy; however, inadvertent computerized transcription errors may be present.   Any transcriptional errors that result from this process are unintentional.    Adin Hector, MD, FACS, MASCRS Esophageal, Gastrointestinal & Colorectal Surgery Robotic and Minimally Invasive Surgery  [facility], [place]  [place]. [address] [place], [place] [address] [phone] Fax [phone] Main  CONTACT INFORMATION:  Weekday (9AM-5PM): Call [facility] main office at [phone]  Weeknight (5PM-9AM) or Weekend/Holiday: Check [email or web address] (password " [email or web address]") for General Surgery [facility] coverage  (Please, do not use SecureChat as it is not reliable communication to operating surgeons for immediate patient care)      04/11/2022  7:49 AM

## 2022-04-11 NOTE — Care Management Important Message (Signed)
Important Message  Patient Details IM Letter given to the Patient. Name: Regina Roberts MRN: [id number] Date of Birth: [birth year]   Medicare Important Message Given:  Yes     Kerin Salen 04/11/2022, 2:04 PM

## 2022-04-11 NOTE — Progress Notes (Signed)
Initial Nutrition Assessment  DOCUMENTATION CODES:   Obesity unspecified  INTERVENTION:   While on clear liquids, Boost Breeze po TID, each supplement provides 250 kcal and 9 grams of protein  Once diet advanced, Boost Plus chocolate TID- Each supplement provides 360kcal and 14g protein.     -Calorie Count once diet advanced  NUTRITION DIAGNOSIS:   Increased nutrient needs related to post-op healing as evidenced by estimated needs.  GOAL:   Patient will meet greater than or equal to 90% of their needs  MONITOR:   PO intake, Supplement acceptance, Diet advancement, Labs, Weight trends, I & O's  REASON FOR ASSESSMENT:   Consult Calorie Count  ASSESSMENT:   71 y.o. female patient with history of colostomy and colon resection in 2022. 6/9 s/p colostomy takedown.  Patient currently on clear liquids. States she has not been tolerating diet advancements beyond liquids. Was on a pureed diet for a few meals and did not like this but still tried to eat. Had a BM today, hopeful for diet advancement. Calorie Count ordered, will start once diet is advanced past liquids.  Pt is drinking Boost supplements, prefers chocolate but ok with berry while on clears. States prior to surgery she was eating anything she wanted with good appetite. Requesting another lunch tray as it arrived 30 minutes after she completed her breakfast tray.   Per weight records, no weight loss noted.  Admission weight: 175 lbs Current weight: 188 lbs  Medications: Vitamin C, Reglan, Multivitamin with minerals daily, Miralax, Pepcid, Lactated ringers, KCl, Compazine  Labs reviewed: Low K   NUTRITION - FOCUSED PHYSICAL EXAM:  No depletions noted.  Diet Order:   Diet Order             Diet clear liquid Room service appropriate? Yes; Fluid consistency: Thin  Diet effective now           Diet - low sodium heart healthy                   EDUCATION NEEDS:   No education needs have been identified  at this time  Skin:  Skin Assessment: Skin Integrity Issues: Skin Integrity Issues:: Incisions Incisions: 6/9 abdomen  Last BM:  6/19 -type 7  Height:   Ht Readings from Last 1 Encounters:  04/01/22 4' 10.25" (1.48 m)    Weight:   Wt Readings from Last 1 Encounters:  04/10/22 85.5 kg    BMI:  Body mass index is 39.06 kg/m.  Estimated Nutritional Needs:   Kcal:  1300-1500  Protein:  65-80g  Fluid:  1.5L/day  Clayton Bibles, MS, RD, LDN Inpatient Clinical Dietitian Contact information available via Amion

## 2022-04-11 NOTE — Plan of Care (Signed)
  Problem: Clinical Measurements: Goal: Respiratory complications will improve Outcome: Progressing   

## 2022-04-11 NOTE — Evaluation (Addendum)
Physical Therapy Evaluation Patient Details Name: Regina Roberts MRN: [id number] DOB: Mar 18, 1951 Today's Date: 04/11/2022  History of Present Illness  Patient is a 71 y/o female admitted for colonic fistula takedown performed 04/01/22.  Has had issues with N&V with ileus post-op.  PMH positive for dislipidemia, HTN, Raynauds, GERD, diverticulitis, CKD.  Clinical Impression  Patient presents with decreased mobility due to generalized weakness with decreased balance and decreased activity tolerance.  Currently ambulating without device with minguard to S assist.  She normally is independent at home with ADL/IADL's living with spouse who is limited by COPD.  She will benefit from skilled PT in the acute setting to allow return home with spouse and likely will not need follow up PT at d/c.         Recommendations for follow up therapy are one component of a multi-disciplinary discharge planning process, led by the attending physician.  Recommendations may be updated based on patient status, additional functional criteria and insurance authorization.  Follow Up Recommendations No PT follow up    Assistance Recommended at Discharge Intermittent Supervision/Assistance  Patient can return home with the following  Help with stairs or ramp for entrance;Assistance with cooking/housework;Assist for transportation    Equipment Recommendations None recommended by PT  Recommendations for Other Services       Functional Status Assessment Patient has had a recent decline in their functional status and demonstrates the ability to make significant improvements in function in a reasonable and predictable amount of time.     Precautions / Restrictions Precautions Precautions: Fall      Mobility  Bed Mobility               General bed mobility comments: up in bathroom upon my entry    Transfers   Equipment used: None                    Ambulation/Gait Ambulation/Gait  assistance: Supervision, Min guard Gait Distance (Feet): 350 Feet Assistive device: None Gait Pattern/deviations: Step-through pattern, Decreased stride length       General Gait Details: generally steady, one LOB when turning head to looking L  Stairs            Wheelchair Mobility    Modified Rankin (Stroke Patients Only)       Balance Overall balance assessment: Needs assistance Sitting-balance support: No upper extremity supported Sitting balance-Leahy Scale: Good     Standing balance support: No upper extremity supported Standing balance-Leahy Scale: Good Standing balance comment: unsteady with head turn during ambulation placing hand on IV pole to steady herself                             Pertinent Vitals/Pain Pain Assessment Pain Score: 4  Pain Location: abdomen, "gas pain" Pain Descriptors / Indicators: Aching, Pressure Pain Intervention(s): Monitored during session    Home Living Family/patient expects to be discharged to:: Private residence Living Arrangements: Spouse/significant other (spouse disabled with COPD) Available Help at Discharge: Family Type of Home: House Home Access: Stairs to enter Entrance Stairs-Rails: Chemical engineer of Steps: 5   Home Layout: One level Home Equipment: Cane - single point      Prior Function Prior Level of Function : Independent/Modified Independent                     Hand Dominance   Dominant Hand: Right    Extremity/Trunk Assessment  Upper Extremity Assessment Upper Extremity Assessment: Defer to OT evaluation    Lower Extremity Assessment Lower Extremity Assessment: Overall WFL for tasks assessed    Cervical / Trunk Assessment Cervical / Trunk Assessment: Kyphotic  Communication   Communication: No difficulties  Cognition Arousal/Alertness: Awake/alert Behavior During Therapy: WFL for tasks assessed/performed Overall Cognitive Status: Within Functional  Limits for tasks assessed                                          General Comments General comments (skin integrity, edema, etc.): VSS with ambulation on RA    Exercises     Assessment/Plan    PT Assessment Patient needs continued PT services  PT Problem List Decreased strength;Decreased activity tolerance;Decreased mobility;Decreased balance;Decreased knowledge of use of DME       PT Treatment Interventions Balance training;DME instruction;Gait training;Stair training;Functional mobility training;Patient/family education;Therapeutic activities;Therapeutic exercise    PT Goals (Current goals can be found in the Care Plan section)  Acute Rehab PT Goals Patient Stated Goal: to return to independent PT Goal Formulation: With patient Time For Goal Achievement: 04/25/22 Potential to Achieve Goals: Good    Frequency Min 3X/week     Co-evaluation               AM-PAC PT "6 Clicks" Mobility  Outcome Measure Help needed turning from your back to your side while in a flat bed without using bedrails?: A Little Help needed moving from lying on your back to sitting on the side of a flat bed without using bedrails?: A Little Help needed moving to and from a bed to a chair (including a wheelchair)?: None Help needed standing up from a chair using your arms (e.g., wheelchair or bedside chair)?: None Help needed to walk in hospital room?: A Little Help needed climbing 3-5 steps with a railing? : Total 6 Click Score: 18    End of Session Equipment Utilized During Treatment: Gait belt Activity Tolerance: Patient tolerated treatment well Patient left: in chair;with call bell/phone within reach   PT Visit Diagnosis: Other abnormalities of gait and mobility (R26.89);Muscle weakness (generalized) (M62.81)    Time: 2505-3976 PT Time Calculation (min) (ACUTE ONLY): 24 min   Charges:   PT Evaluation $PT Eval Low Complexity: 1 Low PT Treatments $Gait Training:  8-22 mins        Magda Kiel, PT Acute Rehabilitation Services Pager:[phone] Office:[phone] 04/11/2022   Regina Roberts 04/11/2022, 4:57 PM

## 2022-04-12 ENCOUNTER — Encounter (HOSPITAL_COMMUNITY): Payer: Self-pay | Admitting: Surgery

## 2022-04-12 ENCOUNTER — Inpatient Hospital Stay: Payer: Self-pay

## 2022-04-12 ENCOUNTER — Inpatient Hospital Stay (HOSPITAL_COMMUNITY): Payer: Medicare Other

## 2022-04-12 LAB — CBC
HCT: 25.4 % — ABNORMAL LOW (ref 36.0–46.0)
Hemoglobin: 8.1 g/dL — ABNORMAL LOW (ref 12.0–15.0)
MCH: 25.4 pg — ABNORMAL LOW (ref 26.0–34.0)
MCHC: 31.9 g/dL (ref 30.0–36.0)
MCV: 79.6 fL — ABNORMAL LOW (ref 80.0–100.0)
Platelets: 563 10*3/uL — ABNORMAL HIGH (ref 150–400)
RBC: 3.19 MIL/uL — ABNORMAL LOW (ref 3.87–5.11)
RDW: 19 % — ABNORMAL HIGH (ref 11.5–15.5)
WBC: 26.2 10*3/uL — ABNORMAL HIGH (ref 4.0–10.5)
nRBC: 0.3 % — ABNORMAL HIGH (ref 0.0–0.2)

## 2022-04-12 LAB — PREALBUMIN: Prealbumin: 12 mg/dL — ABNORMAL LOW (ref 18–38)

## 2022-04-12 LAB — POTASSIUM: Potassium: 3.6 mmol/L (ref 3.5–5.1)

## 2022-04-12 MED ORDER — INSULIN ASPART 100 UNIT/ML IJ SOLN
0.0000 [IU] | Freq: Four times a day (QID) | INTRAMUSCULAR | Status: DC
  Administered 2022-04-13 – 2022-04-14 (×5): 1 [IU] via SUBCUTANEOUS

## 2022-04-12 MED ORDER — SODIUM CHLORIDE 0.9% FLUSH: 10.0000 mL | INTRAVENOUS | Status: DC | PRN

## 2022-04-12 MED ORDER — METOCLOPRAMIDE HCL 5 MG PO TABS
5.0000 mg | ORAL_TABLET | Freq: Three times a day (TID) | ORAL | Status: DC
  Administered 2022-04-12 – 2022-04-14 (×11): 5 mg via ORAL
  Filled 2022-04-12 (×11): qty 1

## 2022-04-12 MED ORDER — LACTATED RINGERS IV BOLUS
1000.0000 mL | Freq: Once | INTRAVENOUS | Status: AC
  Administered 2022-04-12: 1000 mL via INTRAVENOUS

## 2022-04-12 MED ORDER — TRAVASOL 10 % IV SOLN
INTRAVENOUS | Status: AC
  Filled 2022-04-12: qty 480

## 2022-04-12 MED ORDER — CHLORHEXIDINE GLUCONATE CLOTH 2 % EX PADS
6.0000 | MEDICATED_PAD | Freq: Every day | CUTANEOUS | Status: DC
  Administered 2022-04-12 – 2022-04-18 (×7): 6 via TOPICAL

## 2022-04-12 MED ORDER — ENOXAPARIN SODIUM 40 MG/0.4ML IJ SOSY
40.0000 mg | PREFILLED_SYRINGE | INTRAMUSCULAR | Status: DC
  Administered 2022-04-14 – 2022-04-18 (×5): 40 mg via SUBCUTANEOUS
  Filled 2022-04-12 (×5): qty 0.4

## 2022-04-12 MED ORDER — IOHEXOL 9 MG/ML PO SOLN
500.0000 mL | ORAL | Status: AC
  Administered 2022-04-12 (×2): 500 mL via ORAL

## 2022-04-12 MED ORDER — METOCLOPRAMIDE HCL 5 MG/ML IJ SOLN: 5.0000 mg | Freq: Four times a day (QID) | INTRAMUSCULAR | Status: DC | PRN

## 2022-04-12 MED ORDER — IOHEXOL 9 MG/ML PO SOLN
ORAL | Status: AC
  Filled 2022-04-12: qty 1000

## 2022-04-12 MED ORDER — LACTATED RINGERS IV SOLN: INTRAVENOUS | Status: AC

## 2022-04-12 MED ORDER — SODIUM CHLORIDE (PF) 0.9 % IJ SOLN
INTRAMUSCULAR | Status: AC
  Filled 2022-04-12: qty 50

## 2022-04-12 MED ORDER — ONDANSETRON HCL 4 MG PO TABS
4.0000 mg | ORAL_TABLET | Freq: Three times a day (TID) | ORAL | Status: DC
  Administered 2022-04-12 – 2022-04-13 (×7): 4 mg via ORAL
  Filled 2022-04-12 (×7): qty 1

## 2022-04-12 MED ORDER — SODIUM CHLORIDE 0.9% FLUSH
10.0000 mL | Freq: Two times a day (BID) | INTRAVENOUS | Status: DC
  Administered 2022-04-12 – 2022-04-18 (×11): 10 mL

## 2022-04-12 MED ORDER — IOHEXOL 300 MG/ML  SOLN
100.0000 mL | Freq: Once | INTRAMUSCULAR | Status: AC | PRN
Start: 2022-04-12 — End: 2022-04-12
  Administered 2022-04-12: 100 mL via INTRAVENOUS

## 2022-04-12 NOTE — Progress Notes (Addendum)
PHARMACY - TOTAL PARENTERAL NUTRITION CONSULT NOTE   Indication: Prolonged ileus  Patient Measurements: Height: 4' 10.25" (148 cm) Weight: 85.5 kg (188 lb 7.9 oz) IBW/kg (Calculated) : 41.48 TPN AdjBW (KG): 51.1 Body mass index is 39.06 kg/m. Usual Weight:   Assessment: 71 yo F  with Colonic fistula s/p takedown & extensive lysis of adhesions 04/01/2022 with prolonged post-operative ileus PMH: HLD, HTN, obesity, Raynaud, GERD, CKD2  Glucose / Insulin: no hx DM Electrolytes: K 3.6 after 40 K IV yesterday; all other lytes WNL yesterday 6/19 Renal: WNL, hx CKD2 Hepatic: no LFTs yet, f/u in AM Intake / Output; MIVF:  I/O net 556.8,  500 ml stool; 700 ml NGO, 1250 ml UOP/24 hrs IVF: LR @ 50 ml/hr GI meds: Scheduled oral reglan & zofran started by [facility] 6/20 On PO protonix GI Imaging: 6/18 KUB persistent ileus 6/20 CT A/P:  GI Surgeries / Procedures:  6/9 colostomy takedown & extensive lysis of adhesions Central access: PICC ordered 6/20 for TPN TPN start date: 6/20  Nutritional Goals: Goal TPN rate is 60 mL/hr (provides 72 g of protein and 1382 kcals per day)  RD Assessment: Estimated Needs Total Energy Estimated Needs: 1300-1500 Total Protein Estimated Needs: 65-80g Total Fluid Estimated Needs: 1.5L/day  Current Nutrition:  Full liquids  Plan:  Start TPN at 40 mL/hr at 1800 Electrolytes in TPN: Na 26mq/L, K 518m/L, Ca 63m73mL, Mg 63mE263m, and Phos 163mm43m. Cl:Ac 1:1 Add standard MVI and trace elements to TPN Initiate Sensitive q6h SSI and adjust as needed  Reduce MIVF to 10 mL/hr at 1800 Monitor TPN labs on Mon/Thurs,   MicheEudelia [email or web address] 04/12/2022 8:36 AM

## 2022-04-12 NOTE — Progress Notes (Signed)
Notified by the patient's nurse that the patient's husband was in the room at bedside.  He had wanted to talk in person.  I was able to get to the hospital and meet with the patient and her husband at the bedside.  Patient sitting in bed smiling and watching TV using her phone.  Happy and interactive.  No nausea or vomiting with the oral contrast.    Patient's husband with many questions and concerns.  I explained the pathophysiology of diverticulitis & the reasoning for the emergency [name] surgery.  Explained the technique of the ostomy takedown.  Noted her post-operative struggles & improvements.  Relayed my recommendations for TPN nutrition since her oral intake has been intermittent. I explained the reasoning and findings of the CAT scan and just completed a couple hours ago.  I noted my recommendations for drain placement.  She was very Patent attorney.  Husband concerned.  He has been through a lot of abdominal surgery and is frustrated/worried.  I tried to give him time to vent.  My hope would be that we can get a drain to get this pelvic fluid collection to assess it & and get it under control.  I noted if things are uncontrolled sometimes we need to take the patient back to the operating room and do a temporary diverting loop ileostomy as a final way to get things under control.  I noted however that that seemed rather unlikely.  That really upset him, noting that he had struggled with numerous back, abdominal, and other surgeries in the setting of COPD and he worried that she would not do well with another operation.  I noted that she is not in horrible shock and is mobilizing better with less abdominal pain but her intermittent vomiting concerns me.  Hence the CT scan and recommendations for drain placement.  Hopefully that is all she needs.  I think the likelihood of another operation seems rather low but I did caution if it is never 0.  We will see.  Patient appreciative.  Patient's husband's  questions answered.  Adin Hector, MD, FACS, MASCRS Esophageal, Gastrointestinal & Colorectal Surgery Robotic and Minimally Invasive Surgery  [facility], [place]  [place]. [address] [place], [place] [address] [phone] Fax [phone] Main  CONTACT INFORMATION:  Weekday (9AM-5PM): Call [facility] main office at [phone]  Weeknight (5PM-9AM) or Weekend/Holiday: Check [email or web address] (password " [email or web address]") for General Surgery [facility] coverage  (Please, do not use SecureChat as it is not reliable communication to operating surgeons for immediate patient care)

## 2022-04-12 NOTE — Progress Notes (Signed)
24 hour chart audit completed 

## 2022-04-12 NOTE — Evaluation (Signed)
Occupational Therapy Evaluation and Discharge Patient Details Name: Regina Roberts MRN: [id number] DOB: 1951-09-25 Today's Date: 04/12/2022   History of Present Illness Patient is a 71 y/o female admitted for colonic fistula takedown performed 04/01/22.  Has had issues with N&V with ileus post-op.  PMH positive for dislipidemia, HTN, Raynauds, GERD, diverticulitis, CKD.   Clinical Impression   This 71 yo female admitted with above presents to acute OT with all education completed and being able to do most of her basic ADLs (may need a little A at times). No further OT needs, we will sign off.      Recommendations for follow up therapy are one component of a multi-disciplinary discharge planning process, led by the attending physician.  Recommendations may be updated based on patient status, additional functional criteria and insurance authorization.   Follow Up Recommendations  No OT follow up    Assistance Recommended at Discharge PRN  Patient can return home with the following Assistance with cooking/housework;Assist for transportation    Functional Status Assessment  Patient has had a recent decline in their functional status and demonstrates the ability to make significant improvements in function in a reasonable and predictable amount of time. (without further need for skilled OT)  Equipment Recommendations  None recommended by OT       Precautions / Restrictions Precautions Precautions: Fall Restrictions Weight Bearing Restrictions: No      Mobility Bed Mobility Overal bed mobility: Modified Independent             General bed mobility comments: increased time    Transfers Overall transfer level: Modified independent                 General transfer comment: increased time      Balance Overall balance assessment: Needs assistance Sitting-balance support: No upper extremity supported, Feet supported Sitting balance-Leahy Scale: Good      Standing balance support: No upper extremity supported Standing balance-Leahy Scale: Good                             ADL either performed or assessed with clinical judgement   ADL Overall ADL's : Modified independent                                       General ADL Comments: Increased time. She can cross her legs to get to her feet for LBD. Reports she has been getting up and going to the bathroom pushing her own IV pole by herself.     Vision Patient Visual Report: No change from baseline              Pertinent Vitals/Pain Pain Assessment Pain Assessment: Faces Faces Pain Scale: Hurts a little bit Pain Location: "gas pain"--bloating     Hand Dominance Right   Extremity/Trunk Assessment Upper Extremity Assessment Upper Extremity Assessment: Overall WFL for tasks assessed           Communication Communication Communication: No difficulties   Cognition Arousal/Alertness: Awake/alert Behavior During Therapy: WFL for tasks assessed/performed Overall Cognitive Status: Within Functional Limits for tasks assessed  Home Living Family/patient expects to be discharged to:: Private residence Living Arrangements: Spouse/significant other (spouse disabled with COPD) Available Help at Discharge: Family Type of Home: House Home Access: Stairs to enter Technical brewer of Steps: 5 Entrance Stairs-Rails: Left;Right Home Layout: One level     Bathroom Shower/Tub: Teacher, early years/pre: Standard     Home Equipment: [employer] - single point          Prior Functioning/Environment Prior Level of Function : Independent/Modified Independent                        OT Problem List: Pain         OT Goals(Current goals can be found in the care plan section) Acute Rehab OT Goals Patient Stated Goal: to go home soon         AM-PAC OT "6 Clicks"  Daily Activity     Outcome Measure Help from another person eating meals?: None Help from another person taking care of personal grooming?: None Help from another person toileting, which includes using toliet, bedpan, or urinal?: None Help from another person bathing (including washing, rinsing, drying)?: None Help from another person to put on and taking off regular upper body clothing?: None Help from another person to put on and taking off regular lower body clothing?: None 6 Click Score: 24   End of Session    Activity Tolerance: Patient tolerated treatment well Patient left: in bed (will get up in recliner after RN sees her)  OT Visit Diagnosis: Muscle weakness (generalized) (M62.81);Pain Pain - part of body:  (gas pain/bloating)                Time: 9563-8756 OT Time Calculation (min): 13 min Charges:  OT General Charges $OT Visit: 1 Visit OT Evaluation $OT Eval Low Complexity: Livingston Wheeler, OTR/L [facility] [phone] Office [phone]    Almon Register 04/12/2022, 10:19 AM

## 2022-04-12 NOTE — Progress Notes (Signed)
CT scan reveals pelvic fluid collection anterior /right lateral around the colorectal anastomosis with dot of gas.  Could be abscess hematoma or just fluid collection.  However given her mixed ileus picture, I think it would be wise to be aggressive.    I placed an order to get interventional radiology to place a transgluteal drain in the region tomorrow to get this under better control.  Rule out abscess or leak.    Updated pt's nurse, Manuela Schwartz.  Adin Hector, MD, FACS, MASCRS Esophageal, Gastrointestinal & Colorectal Surgery Robotic and Minimally Invasive Surgery  [facility], [place], [place], [place] [address] [phone] Fax [phone] Main  CONTACT INFORMATION:  Weekday (9AM-5PM): Call [facility] main office at [phone]  Weeknight (5PM-9AM) or Weekend/Holiday: Check [email or web address] (password " [email or web address]") for General Surgery [facility] coverage  (Please, do not use SecureChat as it is not reliable communication to operating surgeons for immediate patient care)

## 2022-04-12 NOTE — Plan of Care (Signed)
  Problem: Nutrition: Goal: Adequate nutrition will be maintained Outcome: Progressing   

## 2022-04-12 NOTE — Progress Notes (Signed)
Nutrition Follow-up  DOCUMENTATION CODES:   Obesity unspecified  INTERVENTION:   Monitor magnesium, potassium, and phosphorus BID for at least 3 days, MD to replete as needed, as pt is at risk for refeeding syndrome given inadequate oral intake x 11 days and inability to advance diet.  -TPN management per Pharmacy  -Boost Plus chocolate BID- Each supplement provides 360kcal and 14g protein.     D/c Calorie count at this time.   NUTRITION DIAGNOSIS:   Increased nutrient needs related to post-op healing as evidenced by estimated needs.  Ongoing.  GOAL:   Patient will meet greater than or equal to 90% of their needs  Progressing.  MONITOR:   PO intake, Supplement acceptance, Diet advancement, Labs, Weight trends, I & O's, TPN  REASON FOR ASSESSMENT:   Consult New TPN/TNA  ASSESSMENT:   71 y.o. female patient with history of colostomy and colon resection in 2022. 6/9 s/p colostomy takedown.  Diet was advanced to full liquids yesterday. Will be NPO tomorrow. TPN to begin this evening at 40 ml/hr (providing 921 kcals and 48g protein). Will be at refeeding syndrome risk given poor PO for 11 days now.   Admission weight: 175 lbs. Last weight 6/18: 188 lbs  Medications: Vitamin C, Reglan, Zofran, Miralax, Lactated ringers  Labs reviewed.  Diet Order:   Diet Order             Diet NPO time specified Except for: Sips with Meds, Ice Chips  Diet effective midnight           Diet full liquid Room service appropriate? Yes; Fluid consistency: Thin  Diet effective now           Diet - low sodium heart healthy                   EDUCATION NEEDS:   No education needs have been identified at this time  Skin:  Skin Assessment: Skin Integrity Issues: Skin Integrity Issues:: Incisions Incisions: 6/9 abdomen  Last BM:  6/19 -type 7  Height:   Ht Readings from Last 1 Encounters:  04/01/22 4' 10.25" (1.48 m)    Weight:   Wt Readings from Last 1 Encounters:   04/10/22 85.5 kg    BMI:  Body mass index is 39.06 kg/m.  Estimated Nutritional Needs:   Kcal:  1300-1500  Protein:  65-80g  Fluid:  1.5L/day   Clayton Bibles, MS, RD, LDN Inpatient Clinical Dietitian Contact information available via Amion

## 2022-04-12 NOTE — Consult Note (Signed)
Chief Complaint: Patient was seen in consultation today for pelvic fluid collection s/p robotic lysis of adhesions and colostomy takedown at the request of Dr. Michael Boston  Referring Physician(s): Dr Michael Boston  Supervising Physician: Markus Daft  Patient Status: [name] - In-pt  History of Present Illness: Regina Roberts is a 71 y.o. female with PMH of diverticulitis with perforation, hyperlipidemia, and hypertension s/p robotic lysis of adhesions and colostomy takedown by Dr Johney Maine on 04/12/22. Post-surgical CT of abdomen and pelvis revealed focal fluid collection in the RLQ concerning for abscess, as well as multiple collections of fluid in the LLQ concerning for contained anastomotic leak. IR was consulted on 6/20 to assess for possible image -guided aspiration with possible drain placement.  CT Abdomen & Pelvis w Contrast 04/12/22 IMPRESSION: 1. Status post takedown of left lower quadrant colostomy. Multiple rim enhancing collections containing fluid and air are seen in the pelvis with extension superiorly to into the peritoneum, findings are concerning for contained anastomotic leak with evidence of possible fistula. Repeat CT with contrast enema could provide more definitive characterization. 2. Additional focal fluid collection is seen in the right lower quadrant concerning for developing abscess. 3. Dilated distal small bowel loops are seen with no transition point, likely due to ileus. 4. Aortic Atherosclerosis (ICD10-I70.0).  Past Medical History:  Diagnosis Date   Allergy    sesonal   Blood transfusion without reported diagnosis 1984   Diverticulitis of colon with perforation 02/28/2022   GERD (gastroesophageal reflux disease)    Hyperlipidemia    Hypertension    Osteopenia 10/2018   T score -1.8 FRAX 3.4% / 0.3% able from prior DEXA    Past Surgical History:  Procedure Laterality Date   HERNIA REPAIR  2000   LAPAROTOMY N/A 03/20/2021   Procedure:  LAPAROTOMY;  Surgeon: Clovis Riley, MD;  Location: [facility];  Service: General;  Laterality: N/A;  [place] N/A 04/01/2022   Procedure: LYSIS OF ADHESION;  Surgeon: Michael Boston, MD;  Location: [facility];  Service: General;  Laterality: N/A;   PARTIAL COLECTOMY N/A 03/20/2021   Procedure: PARTIAL COLECTOMY WITH COLOSTOMY;  Surgeon: Clovis Riley, MD;  Location: [facility];  Service: General;  Laterality: N/A;   [place]   PROCTOSCOPY N/A 04/01/2022   Procedure: RIGID PROCTOSCOPY;  Surgeon: Michael Boston, MD;  Location: [facility];  Service: General;  Laterality: N/A;   SMALL BOWEL REPAIR  04/01/2022   Procedure: SMALL BOWEL REPAIR;  Surgeon: Michael Boston, MD;  Location: [facility];  Service: General;;   SUPRACERVICAL ABDOMINAL HYSTERECTOMY  2003   with RSO. Leiomyoma menorrhagia with 16-18 week size uterus   XI ROBOTIC ASSISTED COLOSTOMY TAKEDOWN N/A 04/01/2022   Procedure: ROBOTIC OSTOMY TAKEDOWN, TAKEDOWN COLORECTAL STUMP FISTULA, VAGINAL CUFF REPAIR, AND BILATERAL TAP BLOCK;  Surgeon: Michael Boston, MD;  Location: [facility];  Service: General;  Laterality: N/A;    Allergies: Latex, Lisinopril, Phentermine, and Iodinated contrast media  Medications: Prior to Admission medications   Medication Sig Start Date End Date Taking? Authorizing Provider  atorvastatin (LIPITOR) 10 MG tablet Take 1 tablet (10 mg total) by mouth daily. Annual appt due in Nov must see provider for future refills 05/07/21  Yes Plotnikov, Evie Lacks, MD  b complex vitamins tablet Take 1 tablet by mouth daily. 05/16/17  Yes Plotnikov, Evie Lacks, MD  CALCIUM PO Take 1 tablet by mouth daily.   Yes [provider]  Cholecalciferol (VITAMIN D-3) 1000  UNITS CAPS Take 1,000 Units by mouth daily.   Yes [provider]  Cyanocobalamin ([employer] VITAMIN B12 PO) Take 1 tablet by mouth daily.   Yes [provider]  hydrochlorothiazide (HYDRODIURIL) 12.5 MG tablet TAKE 1 TABLET(12.5 MG) BY MOUTH DAILY  05/07/21  Yes Plotnikov, Evie Lacks, MD  losartan (COZAAR) 100 MG tablet Take 1 tablet (100 mg total) by mouth daily. Annual appt due in Nov must see provider for future refills 05/07/21  Yes Plotnikov, Evie Lacks, MD  montelukast (SINGULAIR) 10 MG tablet TAKE 1 TABLET BY MOUTH EVERY DAY 05/02/21  Yes Plotnikov, Evie Lacks, MD  Multiple Vitamins-Minerals (CENTRUM ADULTS) TABS Take 1 tablet by mouth daily.   Yes [provider]  naproxen sodium (ANAPROX) 220 MG tablet Take 220 mg by mouth as needed (headache or pain).   Yes [provider]  Omega-3 Fatty Acids (FISH OIL PO) Take 1 capsule by mouth daily.   Yes [provider]  pantoprazole (PROTONIX) 40 MG tablet Take 1 tablet (40 mg total) by mouth daily. 05/04/21  Yes Plotnikov, Evie Lacks, MD  triamcinolone ointment (KENALOG) 0.5 % apply to affected area twice a day Patient taking differently: Apply 1 application  topically once as needed (itching). 02/19/20  Yes Plotnikov, Evie Lacks, MD     Family History  Problem Relation Age of Onset   Heart disease Mother 53   Cancer Mother 33       neck ca    Stroke Father 32   Other Brother        train accident   Other Brother        respiratory   Other Brother        liver failure ?   Breast cancer Maternal Grandmother        untreated   Other Daughter        suicide   Pancreatic cancer Neg Hx    Rectal cancer Neg Hx    Stomach cancer Neg Hx     Social History   Socioeconomic History   Marital status: Married    Spouse name: Not on file   Number of children: 1   Years of education: Not on file   Highest education level: Not on file  Occupational History   Occupation: retired  Tobacco Use   Smoking status: Never   Smokeless tobacco: Never  Vaping Use   Vaping Use: Never used  Substance and Sexual Activity   Alcohol use: Not Currently   Drug use: No   Sexual activity: Not Currently    Partners: Male    Birth control/protection: Surgical    Comment: 1st  intercourse 18 yo-5 partners, hysterectomy  Other Topics Concern   Not on file  Social History Narrative   Not on file   Social Determinants of Health   Financial Resource Strain: Low Risk  (03/16/2022)   Overall Financial Resource Strain (CARDIA)    Difficulty of Paying Living Expenses: Not hard at all  Food Insecurity: No Food Insecurity (03/16/2022)   Hunger Vital Sign    Worried About Running Out of Food in the Last Year: Never true    [place] in the Last Year: Never true  Transportation Needs: No Transportation Needs (03/16/2022)   PRAPARE - Hydrologist (Medical): No    Lack of Transportation (Non-Medical): No  Physical Activity: Inactive (03/16/2022)   Exercise Vital Sign    Days of Exercise per Week: 0 days  Minutes of Exercise per Session: 0 min  Stress: No Stress Concern Present (03/16/2022)   [place]    Feeling of Stress : Not at all  Social Connections: [place] (03/16/2022)   Social Connection and Isolation Panel [NHANES]    Frequency of Communication with Friends and Family: More than three times a week    Frequency of Social Gatherings with Friends and Family: Once a week    Attends Religious Services: More than 4 times per year    Active Member of [employer] or Organizations: Yes    Attends Music therapist: More than 4 times per year    Marital Status: Married      Review of Systems: A 12 point ROS discussed and pertinent positives are indicated in the HPI above.  All other systems are negative.  Review of Systems  Constitutional:  Negative for chills and fever.  Respiratory:  Negative for chest tightness and shortness of breath.   Cardiovascular:  Positive for leg swelling. Negative for chest pain.       Pt states her feet feel more swollen than normal  Gastrointestinal:  Positive for abdominal distention, nausea and vomiting. Negative  for abdominal pain and diarrhea.       Pt states she feels bloated from surgery, with nausea and vomiting improving after her surgery  Neurological:  Negative for dizziness and headaches.  Psychiatric/Behavioral:  Negative for confusion.     Vital Signs: BP (!) 162/89 (BP Location: Right Arm)   Pulse 83   Temp 99 F (37.2 C) (Oral)   Resp 16   Ht 4' 10.25" (1.48 m)   Wt 188 lb 7.9 oz (85.5 kg)   SpO2 98%   BMI 39.06 kg/m    Physical Exam Constitutional:      General: She is not in acute distress. HENT:     Mouth/Throat:     Mouth: Mucous membranes are moist.  Cardiovascular:     Rate and Rhythm: Normal rate and regular rhythm.     Pulses: Normal pulses.     Heart sounds: Normal heart sounds.  Pulmonary:     Effort: Pulmonary effort is normal.     Breath sounds: Normal breath sounds.  Abdominal:     General: Bowel sounds are normal.     Palpations: Abdomen is soft.     Tenderness: There is abdominal tenderness.     Comments: Mild tenderness around surgical sites  Musculoskeletal:     Right lower leg: No edema.     Left lower leg: No edema.  Skin:    General: Skin is warm and dry.  Neurological:     Mental Status: She is alert and oriented to person, place, and time.  Psychiatric:        Behavior: Behavior normal.     Imaging: CT ABDOMEN PELVIS W CONTRAST  Result Date: 04/12/2022 CLINICAL DATA:  Postop abdominal pain; colonic fistula s/p takedown 04/01/22 EXAM: CT ABDOMEN AND PELVIS WITH CONTRAST TECHNIQUE: Multidetector CT imaging of the abdomen and pelvis was performed using the standard protocol following bolus administration of intravenous contrast. RADIATION DOSE REDUCTION: This exam was performed according to the departmental dose-optimization program which includes automated exposure control, adjustment of the mA and/or kV according to patient size and/or use of iterative reconstruction technique. CONTRAST:  137m OMNIPAQUE IOHEXOL 300 MG/ML  SOLN COMPARISON:   CT abdomen and pelvis dated January 20, 2022 FINDINGS: Lower chest: No acute abnormality.  Hepatobiliary: No focal liver abnormality is seen. No gallstones, gallbladder wall thickening, or biliary dilatation. Pancreas: Unremarkable. No pancreatic ductal dilatation or surrounding inflammatory changes. Spleen: Normal in size without focal abnormality. Adrenals/Urinary Tract: Bilateral adrenal glands are unremarkable. Mild left hydronephrosis with caliber change at the ureteropelvic junction, unchanged when compared with prior and likely due to chronic UPJ obstruction. Unchanged minimally complex lower pole right renal cyst with associated punctate calcification. Stomach/Bowel: Status post takedown of left lower quadrant colostomy. Multiple rim enhancing collections containing fluid and air are seen in the pelvis adjacent to the anastomosis. Fluid and air extends superiorly into the peritoneum on series 2 image 51-46, concerning for colonic peritoneal fistula. Largest fluid collection is located inferior to the anastomosis and measures 4.8 x 4.1 cm on series 2, image 63 and has associated small locules of air. Additional focal fluid of the right lower quadrant seen on series 2, image 49. Dilated distal small bowel loops are seen with no transition point. Vascular/Lymphatic: Aortic atherosclerosis. No enlarged abdominal or pelvic lymph nodes. Reproductive: Status post hysterectomy. Other: Postsurgical changes of the anterior abdominal wall with subcutaneous soft tissue gas, likely postsurgical. Musculoskeletal: No acute or significant osseous findings. IMPRESSION: 1. Status post takedown of left lower quadrant colostomy. Multiple rim enhancing collections containing fluid and air are seen in the pelvis with extension superiorly to into the peritoneum, findings are concerning for contained anastomotic leak with evidence of possible fistula. Repeat CT with contrast enema could provide more definitive characterization. 2.  Additional focal fluid collection is seen in the right lower quadrant concerning for developing abscess. 3. Dilated distal small bowel loops are seen with no transition point, likely due to ileus. 4. Aortic Atherosclerosis (ICD10-I70.0). Electronically Signed   By: Yetta Glassman M.D.   On: 04/12/2022 13:13   [place] EKG SITE RITE  Result Date: 04/12/2022 If Site Rite image not attached, placement could not be confirmed due to current cardiac rhythm.  DG Abd Portable 2V  Result Date: 04/10/2022 CLINICAL DATA:  Ileus following gastrointestinal surgery. EXAM: PORTABLE ABDOMEN - 2 VIEW COMPARISON:  One-view abdomen 04/04/2022 FINDINGS: Mild gaseous distension of small bowel remains. Gas is present throughout the colon. No free air evident on supine scratched at no free air is present. Axial skeleton is within normal limits. IMPRESSION: Persistent mild gaseous distension of small bowel, compatible with ileus. Electronically Signed   By: San Morelle M.D.   On: 04/10/2022 12:13   DG Abd 1 View  Result Date: 04/04/2022 CLINICAL DATA:  Enteric tube placement EXAM: ABDOMEN - 1 VIEW COMPARISON:  03/15/2021 abdominal radiograph FINDINGS: Enteric tube terminates in the proximal body of the stomach. Several mildly dilated small bowel loops throughout the abdomen. Right lower quadrant approach surgical drain terminates in the left abdomen. No evidence of pneumatosis or pneumoperitoneum. Clear lung bases. Subcutaneous emphysema in the lateral abdominal wall bilaterally. No radiopaque nephrolithiasis. IMPRESSION: 1. Enteric tube terminates in the proximal body of the stomach. 2. Surgical drain terminates in the left abdomen. 3. Mildly dilated small bowel loops throughout the abdomen, compatible with mild postoperative adynamic ileus. Electronically Signed   By: Ilona Sorrel M.D.   On: 04/04/2022 12:12    Labs:  CBC: Recent Labs    04/06/22 0456 04/07/22 0429 04/11/22 0029 04/12/22 0000  WBC 15.7* 15.2*  21.6* 26.2*  HGB 7.4* 7.1* 7.4* 8.1*  HCT 24.4* 23.0* 23.2* 25.4*  PLT 390 405* 507* 563*    COAGS: No results for input(s): "INR", "APTT" in  the last 8760 hours.  BMP: Recent Labs    04/05/22 0412 04/06/22 0456 04/07/22 0429 04/11/22 0029 04/12/22 0000  NA 143 142 143 144  --   K 4.1 4.1 3.7 3.1* 3.6  CL 107 105 108 109  --   CO2 '31 27 28 24  '$ --   GLUCOSE 117* 98 95 102*  --   BUN '18 17 16 13  '$ --   CALCIUM 9.2 9.1 8.7* 8.2*  --   CREATININE 0.87 0.89 0.81 0.78  --   GFRNONAA >60 >60 >60 >60  --     LIVER FUNCTION TESTS: Recent Labs    09/08/21 1128  BILITOT 0.4  AST 11  ALT 13  ALKPHOS 68  PROT 7.7  ALBUMIN 4.2    TUMOR MARKERS: No results for input(s): "AFPTM", "CEA", "CA199", "CHROMGRNA" in the last 8760 hours.  Assessment and Plan:  Regina Roberts is a 71yo female with a pelvic fluid collection s/p robotic lysis of adhesions and colostomy takedown by Dr. Johney Maine on 04/12/22. At time of exam, patient was sitting upright in bed with her husband in the room. Both were pleasant and happy to discuss the possible procedure to be performed tomorrow. Patient to be made NPO at midnight and Lovenox has been held. Dr Anselm Pancoast has reviewed imaging and approved patient for image-guided aspiration with possible drain placement for possibly 04/13/22.   Risks and benefits discussed with the patient including bleeding, infection, damage to adjacent structures, bowel perforation/fistula connection, and sepsis.  All of the patient's questions were answered, patient is agreeable to proceed. Consent signed and in chart.   Thank you for this interesting consult.  I greatly enjoyed meeting [facility] and look forward to participating in their care.  A copy of this report was sent to the requesting provider on this date.  Electronically Signed: Lura Em, PA-C 04/12/2022, 3:43 PM   I spent a total of 20 Minutes    in face to face in clinical consultation, greater  than 50% of which was counseling/coordinating care for pelvic fluid collection.

## 2022-04-12 NOTE — Progress Notes (Addendum)
Regina Roberts [id number] January 07, 1951  CARE TEAM:  PCP: Cassandria Anger, MD  Outpatient Care Team: Patient Care Team: Plotnikov, Evie Lacks, MD as PCP - General Fontaine, Belinda Block, MD (Inactive) as Consulting Physician (Gynecology) Pyrtle, Lajuan Lines, MD as Consulting Physician (Gastroenterology) Clovis Riley, MD as Consulting Physician (General Surgery) Rutherford Guys, MD as Consulting Physician (Ophthalmology) Michael Boston, MD as Consulting Physician (Colon and Rectal Surgery)  Inpatient Treatment Team: Treatment Team: Attending Provider: Michael Boston, MD; Registered Nurse: Lucillie Garfinkel, RN; Technician: Constance Goltz, NT; Occupational Therapist: Randon Goldsmith, OT; Registered Nurse: Aura Dials, RN; Pharmacist: Eudelia Bunch, [facility]   Problem List:   Principal Problem:   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 Active Problems:   Dyslipidemia   Essential hypertension   Obesity, Class II, BMI 35-39.9   Raynaud phenomenon   Allergic rhinitis   GERD (gastroesophageal reflux disease)   History of diverticulitis of colon   CKD (chronic kidney disease) stage 2, GFR 60-89 ml/min   11 Days Post-Op  04/01/2022  POST-OPERATIVE DIAGNOSIS:   HISTORY OF PERFORATED DIVERTICULITIS S/P HARTMANN RESECTION (COLECTOMY/COLOSTOMY) CECUM-to RECTAL STUMP FISTULA INCOMPLETE RECTOSIGMOID STUMP-to-VAGINAL CUFF FISTULA DESIRE FOR OSTOMY TAKEDOWN   PROCEDURE:   ROBOTIC COLOSTOMY TAKEDOWN, LOW ANTERIOR RECTOSIGMOID RESECTION TAKEDOWN OF CECUM-to RECTAL STUMP FISTULA TAKEDOWN OF RECTOSIGMOID STUMP-to-VAGINAL CUFF FISTULA  ROBOTIC LYSIS OF ADHESIONS x 2.5 hours ILEAL REPAIR RIGID PROCTOSCOPY TRANSVERSUS ABDOMINIS PLANE (TAP) BLOCK - BILATERAL   SURGEON:  Michael Boston, MD, FACS  OR FINDINGS:  Patient had moderately dense adhesions of omentum to anterior wall as well as small bowel adhesions and specially down the pelvis.  Patient had very dense adhesions between  the proximal right corner of the rectosigmoid stump to the posterior cecal wall that correlated with the cecum to rectosigmoid stump fistula.  Patient had rectosigmoid densely adherent to the vaginal cuff with a pocket suspicious for either nearly resolved abscess or possible and complete fistula from rectal stump to the vaginal cuff.  Both areas repaired with absorbable serrated V-Loc suture in a running fashion on the cecum and vaginal cuff.  Rectosigmoid resection done of the 2 potential fistulous connections.     It is a and descending colon to and proximal rectum 29 EEA stapled anastomosis.  Anastomosis rests 12 cm from the anal verge.    Assessment  Mixed picture with BMs yet emesis x1 again.  [facility] Stay = 11 days)  Plan:  CT scan abd/pelvis today - r/o collection, SBO, etc  Place PICC line and start TPN to ensure full nutrition for now.  Prealbumin bordeline.  NGT if worse - difficult placement last time so hold off  IVF bolus.  IV fluids with as needed backup.  Nausea control PO sched & PRN nausea breakthrough  Stay at fulls/clears.  Get calorie counts.  Supplemental shakes.  If tolerates, slowly advance diet.  If not, start parenteral nutrition  Hypokalemia - Corrected.  Magnesium OK.  Follow  History of chronic kidney disease but mild.  No oliguria and creatinine stable.  Follow closely.  Gently rehydrate.  Benefit of PICC over concerns = place  Hypertension.  Use as needed medications for now and follow   Pathology shows inflammation but no cancer or tumor or other surprises at the colostomy, rectal stump, distal anastomotic ring.    Anemia.  Most likely iron deficiency and chronic disease in the setting of acute blood loss.  Gave IV iron 6/13 since enteral route not an  option.  Shorten interval of hydromorphone since no oral options.  Robaxin.  Ice/heat.    VTE prophylaxis- SCDs, enoxaparin, etc.  Mobilize as tolerated to help recovery.  Try to encourage her  more than just once a day in the chair and once in the hallway.  Try and move around more.  Have physical Occupational Therapy evaluate to make sure there are no equipment or therapy concerns.  Pt more involved  Disposition:  Disposition:  The patient is from: Home  Anticipate discharge to:  Home  Anticipated Date of Discharge is:  June 24,2023    Barriers to discharge:  Pending Clinical improvement (more likely than not)  Patient currently is NOT MEDICALLY STABLE for discharge from the hospital from a surgery standpoint.      I reviewed nursing notes, last 24 h vitals and pain scores, last 48 h intake and output, last 24 h labs and trends, and last 24 h imaging results. I have reviewed this patient's available data, including medical history, events of note, test results, etc as part of my evaluation.  A significant portion of that time was spent in counseling.  Care during the described time interval was provided by me.  This care required moderate level of medical decision making.  04/12/2022    Subjective: (Chief complaint)  Patient physical therapy involved more than ever.  Was up in chair more than ever.  Notes some intermittent discomfort and bloating epigastric -but it is mild and not severe.  No more flank pain or lower abdominal pain..  Felt nauseated after having pudding last night and vomited.  Otherwise feels fine.  Nursing just outside room.  Patient notes her husband wants to physically talk with me.  I noted I will be in the hospital Wednesday morning all day Thursday so hopefully it can happen.  Objective:  Vital signs:  Vitals:   04/11/22 1317 04/11/22 2132 04/12/22 0050 04/12/22 0537  BP: (!) 155/85 (!) 159/84 (!) 162/93 (!) 164/86  Pulse: 81 97 94 98  Resp: '14 18 18 18  '$ Temp: 99.2 F (37.3 C) 99.1 F (37.3 C) 100 F (37.8 C) 100.3 F (37.9 C)  TempSrc: Oral Oral Oral Oral  SpO2: 97% 97% 97% 96%  Weight:      Height:        Last BM Date :  04/11/22  Intake/Output   Yesterday:  06/19 0701 - 06/20 0700 In: 1893.2 [P.O.:720; I.V.:723.3; IV Piggyback:449.9] Out: 2450 [Urine:1250; Emesis/NG output:700; Stool:500] This shift:  No intake/output data recorded.  Bowel function:  Flatus: YES  BM:  YES  NGT: none   Physical Exam:  General: Pt awake/alert in no acute distress.  Not toxic or sickly.  Alert, calm Eyes: PERRL, normal EOM.  Sclera clear.  No icterus Neuro: CN II-XII intact w/o focal sensory/motor deficits. Lymph: No head/neck/groin lymphadenopathy Psych:  No delerium/psychosis/paranoia.  Oriented x 4 HENT: Normocephalic, Mucus membranes moist.  No thrush Neck: Supple, No tracheal deviation.  No obvious thyromegaly Chest: No pain to chest wall compression.  Good respiratory excursion.  No audible wheezing CV:  Pulses intact.  Regular rhythm.  No major extremity edema MS: Normal AROM mjr joints.  No obvious deformity  Abdomen: Soft.  Nondistended.  Nontender.  No colostomy wound dry with old blood.  No evidence of peritonitis.  No incarcerated hernias.  Ext:   No deformity.  No mjr edema.  No cyanosis Skin: No petechiae / purpurea.  No major sores.  Warm and dry  Results:   Cultures: No results found for this or any previous visit (from the past 720 hour(s)).  Labs: Results for orders placed or performed during the hospital encounter of 04/01/22 (from the past 48 hour(s))  CBC     Status: Abnormal   Collection Time: 04/11/22 12:29 AM  Result Value Ref Range   WBC 21.6 (H) 4.0 - 10.5 K/uL   RBC 2.93 (L) 3.87 - 5.11 MIL/uL   Hemoglobin 7.4 (L) 12.0 - 15.0 g/dL   HCT 23.2 (L) 36.0 - 46.0 %   MCV 79.2 (L) 80.0 - 100.0 fL   MCH 25.3 (L) 26.0 - 34.0 pg   MCHC 31.9 30.0 - 36.0 g/dL   RDW 18.9 (H) 11.5 - 15.5 %   Platelets 507 (H) 150 - 400 K/uL   nRBC 0.5 (H) 0.0 - 0.2 %    Comment: Performed at [facility], [place] [address] [place], [place] [address]  Basic metabolic panel      Status: Abnormal   Collection Time: 04/11/22 12:29 AM  Result Value Ref Range   Sodium 144 135 - 145 mmol/L   Potassium 3.1 (L) 3.5 - 5.1 mmol/L   Chloride 109 98 - 111 mmol/L   CO2 24 22 - 32 mmol/L   Glucose, Bld 102 (H) 70 - 99 mg/dL    Comment: Glucose reference range applies only to samples taken after fasting for at least 8 hours.   BUN 13 8 - 23 mg/dL   Creatinine, Ser 0.78 0.44 - 1.00 mg/dL   Calcium 8.2 (L) 8.9 - 10.3 mg/dL   GFR, Estimated >60 >60 mL/min    Comment: (NOTE) Calculated using the CKD-EPI Creatinine Equation (2021)    Anion gap 11 5 - 15    Comment: Performed at [facility], [place] [address], [place], [place] [address]  Magnesium     Status: None   Collection Time: 04/11/22 12:29 AM  Result Value Ref Range   Magnesium 2.2 1.7 - 2.4 mg/dL    Comment: Performed at [facility], [place] [address], [place], [place] [address]  Prealbumin     Status: Abnormal   Collection Time: 04/12/22 12:00 AM  Result Value Ref Range   Prealbumin 12.0 (L) 18 - 38 mg/dL    Comment: Performed at [place] [address] [place], [place] [address]  Potassium     Status: None   Collection Time: 04/12/22 12:00 AM  Result Value Ref Range   Potassium 3.6 3.5 - 5.1 mmol/L    Comment: Performed at [facility], [place] [address], [place], [place] [address]  CBC     Status: Abnormal   Collection Time: 04/12/22 12:00 AM  Result Value Ref Range   WBC 26.2 (H) 4.0 - 10.5 K/uL   RBC 3.19 (L) 3.87 - 5.11 MIL/uL   Hemoglobin 8.1 (L) 12.0 - 15.0 g/dL   HCT 25.4 (L) 36.0 - 46.0 %   MCV 79.6 (L) 80.0 - 100.0 fL   MCH 25.4 (L) 26.0 - 34.0 pg   MCHC 31.9 30.0 - 36.0 g/dL   RDW 19.0 (H) 11.5 - 15.5 %   Platelets 563 (H) 150 - 400 K/uL   nRBC 0.3 (H) 0.0 - 0.2 %    Comment: Performed at [facility], [place] [address] [place],  [address]    Imaging / Studies: DG Abd Portable 2V  Result Date:  04/10/2022 CLINICAL DATA:  Ileus  following gastrointestinal surgery. EXAM: PORTABLE ABDOMEN - 2 VIEW COMPARISON:  One-view abdomen 04/04/2022 FINDINGS: Mild gaseous distension of small bowel remains. Gas is present throughout the colon. No free air evident on supine scratched at no free air is present. Axial skeleton is within normal limits. IMPRESSION: Persistent mild gaseous distension of small bowel, compatible with ileus. Electronically Signed   By: San Morelle M.D.   On: 04/10/2022 12:13    Medications / Allergies: per chart  Antibiotics: Anti-infectives (From admission, onward)    Start     Dose/Rate Route Frequency Ordered Stop   04/01/22 2200  cefoTEtan (CEFOTAN) 2 g in sodium chloride 0.9 % 100 mL IVPB        2 g 200 mL/hr over 30 Minutes Intravenous Every 12 hours 04/01/22 1714 04/01/22 2316   04/01/22 1400  neomycin (MYCIFRADIN) tablet 1,000 mg  Status:  Discontinued       See Hyperspace for full Linked Orders Report.   1,000 mg Oral 3 times per day 04/01/22 0756 04/01/22 0807   04/01/22 1400  metroNIDAZOLE (FLAGYL) tablet 1,000 mg  Status:  Discontinued       See Hyperspace for full Linked Orders Report.   1,000 mg Oral 3 times per day 04/01/22 0756 04/01/22 0807   04/01/22 0801  sodium chloride 0.9 % with cefoTEtan (CEFOTAN) ADS Med       Note to Pharmacy: Kyra Leyland E: cabinet override      04/01/22 0801 04/01/22 1015   04/01/22 0800  cefoTEtan (CEFOTAN) 2 g in sodium chloride 0.9 % 100 mL IVPB        2 g 200 mL/hr over 30 Minutes Intravenous On call to O.R. 04/01/22 0756 04/01/22 1030         Note: Portions of this report may have been transcribed using voice recognition software. Every effort was made to ensure accuracy; however, inadvertent computerized transcription errors may be present.   Any transcriptional errors that result from this process are unintentional.    Adin Hector, MD, FACS, MASCRS Esophageal, Gastrointestinal & Colorectal  Surgery Robotic and Minimally Invasive Surgery  [facility], [place]  [place]. [address] [place], [place] [address] [phone] Fax [phone] Main  CONTACT INFORMATION:  Weekday (9AM-5PM): Call [facility] main office at [phone]  Weeknight (5PM-9AM) or Weekend/Holiday: Check [email or web address] (password " [email or web address]") for General Surgery [facility] coverage  (Please, do not use SecureChat as it is not reliable communication to operating surgeons for immediate patient care)      04/12/2022  7:28 AM

## 2022-04-12 NOTE — Progress Notes (Signed)
Peripherally Inserted Central Catheter Placement  The IV Nurse has discussed with the patient and/or persons authorized to consent for the patient, the purpose of this procedure and the potential benefits and risks involved with this procedure.  The benefits include less needle sticks, lab draws from the catheter, and the patient may be discharged home with the catheter. Risks include, but not limited to, infection, bleeding, blood clot (thrombus formation), and puncture of an artery; nerve damage and irregular heartbeat and possibility to perform a PICC exchange if needed/ordered by physician.  Alternatives to this procedure were also discussed.  Bard Power PICC patient education guide, fact sheet on infection prevention and patient information card has been provided to patient /or left at bedside.    PICC Placement Documentation  PICC Double Lumen 04/12/22 Right Brachial 35 cm 0 cm (Active)  Indication for Insertion or Continuance of Line Administration of hyperosmolar/irritating solutions (i.e. TPN, Vancomycin, etc.) 04/12/22 1442  Exposed Catheter (cm) 0 cm 04/12/22 1442  Site Assessment Clean, Dry, Intact 04/12/22 1442  Lumen #1 Status Flushed;Blood return noted;Saline locked 04/12/22 1442  Lumen #2 Status Flushed;Blood return noted;Saline locked 04/12/22 1442  Dressing Type Transparent 04/12/22 1442  Dressing Status Antimicrobial disc in place 04/12/22 1442  Dressing Change Due 04/19/22 04/12/22 1442       Regina Roberts 04/12/2022, 2:44 PM

## 2022-04-13 ENCOUNTER — Inpatient Hospital Stay (HOSPITAL_COMMUNITY): Payer: Medicare Other

## 2022-04-13 LAB — TRIGLYCERIDES: Triglycerides: 135 mg/dL (ref <150)

## 2022-04-13 LAB — PROTIME-INR
INR: 1.2 (ref 0.8–1.2)
Prothrombin Time: 14.8 seconds (ref 11.4–15.2)

## 2022-04-13 LAB — COMPREHENSIVE METABOLIC PANEL
ALT: 35 U/L (ref 0–44)
AST: 16 U/L (ref 15–41)
Albumin: 2.7 g/dL — ABNORMAL LOW (ref 3.5–5.0)
Alkaline Phosphatase: 58 U/L (ref 38–126)
Anion gap: 5 (ref 5–15)
BUN: 9 mg/dL (ref 8–23)
CO2: 27 mmol/L (ref 22–32)
Calcium: 8.3 mg/dL — ABNORMAL LOW (ref 8.9–10.3)
Chloride: 108 mmol/L (ref 98–111)
Creatinine, Ser: 0.81 mg/dL (ref 0.44–1.00)
GFR, Estimated: >60 mL/min (ref >60)
Glucose, Bld: 139 mg/dL — ABNORMAL HIGH (ref 70–99)
Potassium: 3.4 mmol/L — ABNORMAL LOW (ref 3.5–5.1)
Sodium: 140 mmol/L (ref 135–145)
Total Bilirubin: 0.5 mg/dL (ref 0.3–1.2)
Total Protein: 5.9 g/dL — ABNORMAL LOW (ref 6.5–8.1)

## 2022-04-13 LAB — GLUCOSE, CAPILLARY
Glucose-Capillary: 127 mg/dL — ABNORMAL HIGH (ref 70–99)
Glucose-Capillary: 131 mg/dL — ABNORMAL HIGH (ref 70–99)
Glucose-Capillary: 138 mg/dL — ABNORMAL HIGH (ref 70–99)
Glucose-Capillary: 140 mg/dL — ABNORMAL HIGH (ref 70–99)
Glucose-Capillary: 144 mg/dL — ABNORMAL HIGH (ref 70–99)

## 2022-04-13 LAB — CBC
HCT: 22.7 % — ABNORMAL LOW (ref 36.0–46.0)
Hemoglobin: 7.2 g/dL — ABNORMAL LOW (ref 12.0–15.0)
MCH: 25.1 pg — ABNORMAL LOW (ref 26.0–34.0)
MCHC: 31.7 g/dL (ref 30.0–36.0)
MCV: 79.1 fL — ABNORMAL LOW (ref 80.0–100.0)
Platelets: 534 10*3/uL — ABNORMAL HIGH (ref 150–400)
RBC: 2.87 MIL/uL — ABNORMAL LOW (ref 3.87–5.11)
RDW: 19 % — ABNORMAL HIGH (ref 11.5–15.5)
WBC: 23.2 10*3/uL — ABNORMAL HIGH (ref 4.0–10.5)
nRBC: 0.5 % — ABNORMAL HIGH (ref 0.0–0.2)

## 2022-04-13 LAB — PHOSPHORUS: Phosphorus: 3.8 mg/dL (ref 2.5–4.6)

## 2022-04-13 LAB — MAGNESIUM: Magnesium: 2.1 mg/dL (ref 1.7–2.4)

## 2022-04-13 MED ORDER — FENTANYL CITRATE (PF) 100 MCG/2ML IJ SOLN
INTRAMUSCULAR | Status: AC
  Filled 2022-04-13: qty 2

## 2022-04-13 MED ORDER — MIDAZOLAM HCL 2 MG/2ML IJ SOLN
INTRAMUSCULAR | Status: AC | PRN
  Administered 2022-04-13 (×2): 1 mg via INTRAVENOUS

## 2022-04-13 MED ORDER — TRAVASOL 10 % IV SOLN
INTRAVENOUS | Status: AC
  Filled 2022-04-13: qty 720

## 2022-04-13 MED ORDER — MIDAZOLAM HCL 2 MG/2ML IJ SOLN
INTRAMUSCULAR | Status: AC
  Filled 2022-04-13: qty 4

## 2022-04-13 MED ORDER — FENTANYL CITRATE (PF) 100 MCG/2ML IJ SOLN
INTRAMUSCULAR | Status: AC | PRN
  Administered 2022-04-13 (×2): 50 ug via INTRAVENOUS

## 2022-04-13 MED ORDER — PIPERACILLIN-TAZOBACTAM 3.375 G IVPB
3.3750 g | Freq: Three times a day (TID) | INTRAVENOUS | Status: DC
  Administered 2022-04-13 – 2022-04-18 (×15): 3.375 g via INTRAVENOUS
  Filled 2022-04-13 (×17): qty 50

## 2022-04-13 MED ORDER — POTASSIUM CHLORIDE 10 MEQ/100ML IV SOLN
10.0000 meq | INTRAVENOUS | Status: AC
  Administered 2022-04-13 (×4): 10 meq via INTRAVENOUS
  Filled 2022-04-13 (×4): qty 100

## 2022-04-13 MED ORDER — SODIUM CHLORIDE 0.9% FLUSH
5.0000 mL | Freq: Three times a day (TID) | INTRAVENOUS | Status: DC
Start: 2022-04-13 — End: 2022-04-18
  Administered 2022-04-13 – 2022-04-18 (×13): 5 mL

## 2022-04-13 NOTE — Progress Notes (Signed)
Regina Roberts [id number] 15-Dec-1950  CARE TEAM:  PCP: Cassandria Anger, MD  Outpatient Care Team: Patient Care Team: Plotnikov, Evie Lacks, MD as PCP - General Fontaine, Belinda Block, MD (Inactive) as Consulting Physician (Gynecology) Pyrtle, Lajuan Lines, MD as Consulting Physician (Gastroenterology) Clovis Riley, MD as Consulting Physician (General Surgery) Rutherford Guys, MD as Consulting Physician (Ophthalmology) Michael Boston, MD as Consulting Physician (Colon and Rectal Surgery)  Inpatient Treatment Team: Treatment Team: Attending Provider: Michael Boston, MD; Rounding Team: Dorthy Cooler Radiology, MD; Technician: Nydia Bouton, NT; Registered Nurse: Lucillie Garfinkel, RN; Pharmacist: Eudelia Bunch, [facility] Registered Nurse: Aundra Millet, RN; Physical Therapy Assistant: Diego Cory, PTA   Problem List:   Principal Problem:   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 Active Problems:   Dyslipidemia   Essential hypertension   Obesity, Class II, BMI 35-39.9   Raynaud phenomenon   Allergic rhinitis   GERD (gastroesophageal reflux disease)   History of diverticulitis of colon   CKD (chronic kidney disease) stage 2, GFR 60-89 ml/min   12 Days Post-Op  04/01/2022  POST-OPERATIVE DIAGNOSIS:   HISTORY OF PERFORATED DIVERTICULITIS S/P HARTMANN RESECTION (COLECTOMY/COLOSTOMY) CECUM-to RECTAL STUMP FISTULA INCOMPLETE RECTOSIGMOID STUMP-to-VAGINAL CUFF FISTULA DESIRE FOR OSTOMY TAKEDOWN   PROCEDURE:   ROBOTIC COLOSTOMY TAKEDOWN, LOW ANTERIOR RECTOSIGMOID RESECTION TAKEDOWN OF CECUM-to RECTAL STUMP FISTULA TAKEDOWN OF RECTOSIGMOID STUMP-to-VAGINAL CUFF FISTULA  ROBOTIC LYSIS OF ADHESIONS x 2.5 hours ILEAL REPAIR RIGID PROCTOSCOPY TRANSVERSUS ABDOMINIS PLANE (TAP) BLOCK - BILATERAL   SURGEON:  Michael Boston, MD, FACS  OR FINDINGS:  Patient had moderately dense adhesions of omentum to anterior wall as well as small bowel adhesions and specially  down the pelvis.  Patient had very dense adhesions between the proximal right corner of the rectosigmoid stump to the posterior cecal wall that correlated with the cecum to rectosigmoid stump fistula.  Patient had rectosigmoid densely adherent to the vaginal cuff with a pocket suspicious for either nearly resolved abscess or possible and complete fistula from rectal stump to the vaginal cuff.  Both areas repaired with absorbable serrated V-Loc suture in a running fashion on the cecum and vaginal cuff.  Rectosigmoid resection done of the 2 potential fistulous connections.     It is a and descending colon to and proximal rectum 29 EEA stapled anastomosis.  Anastomosis rests 12 cm from the anal verge.    Assessment  Mixed picture with BMs yet emesis x1 again.  [facility] Stay = 12 days)  Plan:  CT scan abd/pelvis with low pelvic fluid collection with gas.  Hard to know if it is postoperative but certainly concern for abscess or contained leak given her persistent ileus.  Plan for intervention radiology to place low pelvic drain most likely transgluteal he.  That can help answer some questions.    Full nutrition.  TPN started 6/20-to allow full nutrition for now.  Most likely can retry p.o. after drain placement Prealbumin bordeline.  If no frank leak noted, will keep at full liquids and may be advance.  If there is concerns back to clears only.  We will see.   Supplemental shakes.  If tolerates, slowly advance diet.  If not, start parenteral nutrition  NGT if worse -patient's nausea is less and emesis free the last 24 hours, with a difficult placement hold off   IVF bolus.  IV fluids with as needed backup.  Nausea control PO sched & PRN nausea breakthrough  Hypokalemia - Corrected.  Magnesium OK.  Follow  History of chronic kidney disease but mild.  No oliguria and creatinine stable.  Follow closely.  Gently rehydrate.  Benefit of PICC over concerns = place  Hypertension.  Use as needed  medications for now and follow   Pathology shows inflammation but no cancer or tumor or other surprises at the colostomy, rectal stump, distal anastomotic ring.    Anemia.  Most likely iron deficiency and chronic disease in the setting of acute blood loss.  Gave IV iron 6/13 since enteral route not an option.  Shorten interval of hydromorphone since no oral options.  Robaxin.  Ice/heat.    VTE prophylaxis- SCDs, enoxaparin, etc.  Mobilize as tolerated to help recovery.  Try to encourage her more than just once a day in the chair and once in the hallway.  Try and move around more.  Have physical Occupational Therapy evaluate to make sure there are no equipment or therapy concerns.  Pt more involved  Disposition:  Disposition:  The patient is from: Home  Anticipate discharge to:  Home  Anticipated Date of Discharge is:  June 24,2023    Barriers to discharge:  Pending Clinical improvement (more likely than not)  Patient currently is NOT MEDICALLY STABLE for discharge from the hospital from a surgery standpoint.      I reviewed nursing notes, last 24 h vitals and pain scores, last 48 h intake and output, last 24 h labs and trends, and last 24 h imaging results. I have reviewed this patient's available data, including medical history, events of note, test results, etc as part of my evaluation.  A significant portion of that time was spent in counseling.  Care during the described time interval was provided by me.  This care required moderate level of medical decision making.  04/13/2022    Subjective: (Chief complaint)  Meeting with husband in room yesterday afternoon.  See prior notes.  Patient feels fine.  No nausea or abdominal pain.  No diarrhea.  Last bowel move yesterday morning.  Passing gas.  Seen by interventional radiology plan for drain today.  PICC line placed and TPN parenteral nutrition started last night  Nurses just coming into room  Objective:  Vital  signs:  Vitals:   04/12/22 1748 04/12/22 1847 04/12/22 2106 04/13/22 0524  BP: (!) 168/103 (!) 151/82 137/76 128/74  Pulse: 83 82 79 79  Resp: '18 16 17 17  '$ Temp: 98.7 F (37.1 C) 98.8 F (37.1 C) 98.5 F (36.9 C) 98.2 F (36.8 C)  TempSrc: Oral Oral  Oral  SpO2: 96% 97% 97% 97%  Weight:      Height:        Last BM Date : 04/12/22  Intake/Output   Yesterday:  06/20 0701 - 06/21 0700 In: 1106.3 [P.O.:150; I.V.:956.3] Out: 2150 [Urine:1000; Stool:900] This shift:  No intake/output data recorded.  Bowel function:  Flatus: YES  BM:  No  NGT: none   Physical Exam:  General: Pt awake/alert in no acute distress.  Not toxic or sickly.  Alert, calm Eyes: PERRL, normal EOM.  Sclera clear.  No icterus Neuro: CN II-XII intact w/o focal sensory/motor deficits. Lymph: No head/neck/groin lymphadenopathy Psych:  No delerium/psychosis/paranoia.  Oriented x 4 HENT: Normocephalic, Mucus membranes moist.  No thrush Neck: Supple, No tracheal deviation.  No obvious thyromegaly Chest: No pain to chest wall compression.  Good respiratory excursion.  No audible wheezing CV:  Pulses intact.  Regular rhythm.  No major extremity edema MS: Normal AROM mjr joints.  No obvious deformity  Abdomen: Obese. Soft.  Nondistended.  Nontender.  Old LLQ colostomy wound clean.  No evidence of peritonitis.  No incarcerated hernias.  Ext:   No deformity.  No mjr edema.  No cyanosis Skin: No petechiae / purpurea.  No major sores.  Warm and dry    Results:   Cultures: No results found for this or any previous visit (from the past 720 hour(s)).  Labs: Results for orders placed or performed during the hospital encounter of 04/01/22 (from the past 48 hour(s))  Prealbumin     Status: Abnormal   Collection Time: 04/12/22 12:00 AM  Result Value Ref Range   Prealbumin 12.0 (L) 18 - 38 mg/dL    Comment: Performed at [facility], [address] [place], [place] [address]  Potassium     Status:  None   Collection Time: 04/12/22 12:00 AM  Result Value Ref Range   Potassium 3.6 3.5 - 5.1 mmol/L    Comment: Performed at [facility], [place] [address] [place], [place] [address]  CBC     Status: Abnormal   Collection Time: 04/12/22 12:00 AM  Result Value Ref Range   WBC 26.2 (H) 4.0 - 10.5 K/uL   RBC 3.19 (L) 3.87 - 5.11 MIL/uL   Hemoglobin 8.1 (L) 12.0 - 15.0 g/dL   HCT 25.4 (L) 36.0 - 46.0 %   MCV 79.6 (L) 80.0 - 100.0 fL   MCH 25.4 (L) 26.0 - 34.0 pg   MCHC 31.9 30.0 - 36.0 g/dL   RDW 19.0 (H) 11.5 - 15.5 %   Platelets 563 (H) 150 - 400 K/uL   nRBC 0.3 (H) 0.0 - 0.2 %    Comment: Performed at [facility], [place] [address] [place], [place] [address]  Glucose, capillary     Status: Abnormal   Collection Time: 04/13/22 12:05 AM  Result Value Ref Range   Glucose-Capillary 138 (H) 70 - 99 mg/dL    Comment: Glucose reference range applies only to samples taken after fasting for at least 8 hours.  Comprehensive metabolic panel     Status: Abnormal   Collection Time: 04/13/22  3:21 AM  Result Value Ref Range   Sodium 140 135 - 145 mmol/L   Potassium 3.4 (L) 3.5 - 5.1 mmol/L   Chloride 108 98 - 111 mmol/L   CO2 27 22 - 32 mmol/L   Glucose, Bld 139 (H) 70 - 99 mg/dL    Comment: Glucose reference range applies only to samples taken after fasting for at least 8 hours.   BUN 9 8 - 23 mg/dL   Creatinine, Ser 0.81 0.44 - 1.00 mg/dL   Calcium 8.3 (L) 8.9 - 10.3 mg/dL   Total Protein 5.9 (L) 6.5 - 8.1 g/dL   Albumin 2.7 (L) 3.5 - 5.0 g/dL   AST 16 15 - 41 U/L   ALT 35 0 - 44 U/L   Alkaline Phosphatase 58 38 - 126 U/L   Total Bilirubin 0.5 0.3 - 1.2 mg/dL   GFR, Estimated >60 >60 mL/min    Comment: (NOTE) Calculated using the CKD-EPI Creatinine Equation (2021)    Anion gap 5 5 - 15    Comment: Performed at [name], [place] [address] [place],  [address]  Magnesium     Status: None   Collection Time: 04/13/22  3:21  AM  Result Value Ref Range   Magnesium 2.1 1.7 - 2.4 mg/dL  Comment: Performed at [facility], [place] [address], [place], [place] [address]  Phosphorus     Status: None   Collection Time: 04/13/22  3:21 AM  Result Value Ref Range   Phosphorus 3.8 2.5 - 4.6 mg/dL    Comment: Performed at [name], [place] [address], [place], [place] [address]  Triglycerides     Status: None   Collection Time: 04/13/22  3:21 AM  Result Value Ref Range   Triglycerides 135 <150 mg/dL    Comment: Performed at [facility], [place] [address], [place], [place] [address]  Protime-INR     Status: None   Collection Time: 04/13/22  3:21 AM  Result Value Ref Range   Prothrombin Time 14.8 11.4 - 15.2 seconds   INR 1.2 0.8 - 1.2    Comment: (NOTE) INR goal varies based on device and disease states. Performed at [facility], [place] [address] [place], [place] [address]   CBC     Status: Abnormal   Collection Time: 04/13/22  3:21 AM  Result Value Ref Range   WBC 23.2 (H) 4.0 - 10.5 K/uL   RBC 2.87 (L) 3.87 - 5.11 MIL/uL   Hemoglobin 7.2 (L) 12.0 - 15.0 g/dL   HCT 22.7 (L) 36.0 - 46.0 %   MCV 79.1 (L) 80.0 - 100.0 fL   MCH 25.1 (L) 26.0 - 34.0 pg   MCHC 31.7 30.0 - 36.0 g/dL   RDW 19.0 (H) 11.5 - 15.5 %   Platelets 534 (H) 150 - 400 K/uL   nRBC 0.5 (H) 0.0 - 0.2 %    Comment: Performed at [facility], [place] [address], [place], [place] [address]  Glucose, capillary     Status: Abnormal   Collection Time: 04/13/22  5:23 AM  Result Value Ref Range   Glucose-Capillary 144 (H) 70 - 99 mg/dL    Comment: Glucose reference range applies only to samples taken after fasting for at least 8 hours.    Imaging / Studies: CT ABDOMEN PELVIS W CONTRAST  Result Date: 04/12/2022 CLINICAL DATA:  Postop abdominal pain; colonic fistula s/p takedown 04/01/22 EXAM: CT ABDOMEN AND PELVIS WITH CONTRAST TECHNIQUE: Multidetector CT imaging  of the abdomen and pelvis was performed using the standard protocol following bolus administration of intravenous contrast. RADIATION DOSE REDUCTION: This exam was performed according to the departmental dose-optimization program which includes automated exposure control, adjustment of the mA and/or kV according to patient size and/or use of iterative reconstruction technique. CONTRAST:  125m OMNIPAQUE IOHEXOL 300 MG/ML  SOLN COMPARISON:  CT abdomen and pelvis dated January 20, 2022 FINDINGS: Lower chest: No acute abnormality. Hepatobiliary: No focal liver abnormality is seen. No gallstones, gallbladder wall thickening, or biliary dilatation. Pancreas: Unremarkable. No pancreatic ductal dilatation or surrounding inflammatory changes. Spleen: Normal in size without focal abnormality. Adrenals/Urinary Tract: Bilateral adrenal glands are unremarkable. Mild left hydronephrosis with caliber change at the ureteropelvic junction, unchanged when compared with prior and likely due to chronic UPJ obstruction. Unchanged minimally complex lower pole right renal cyst with associated punctate calcification. Stomach/Bowel: Status post takedown of left lower quadrant colostomy. Multiple rim enhancing collections containing fluid and air are seen in the pelvis adjacent to the anastomosis. Fluid and air extends superiorly into the peritoneum on series 2 image 51-46, concerning for colonic peritoneal fistula. Largest fluid collection is located inferior to the anastomosis and measures 4.8 x 4.1 cm on series 2, image 63 and has associated  small locules of air. Additional focal fluid of the right lower quadrant seen on series 2, image 49. Dilated distal small bowel loops are seen with no transition point. Vascular/Lymphatic: Aortic atherosclerosis. No enlarged abdominal or pelvic lymph nodes. Reproductive: Status post hysterectomy. Other: Postsurgical changes of the anterior abdominal wall with subcutaneous soft tissue gas, likely  postsurgical. Musculoskeletal: No acute or significant osseous findings. IMPRESSION: 1. Status post takedown of left lower quadrant colostomy. Multiple rim enhancing collections containing fluid and air are seen in the pelvis with extension superiorly to into the peritoneum, findings are concerning for contained anastomotic leak with evidence of possible fistula. Repeat CT with contrast enema could provide more definitive characterization. 2. Additional focal fluid collection is seen in the right lower quadrant concerning for developing abscess. 3. Dilated distal small bowel loops are seen with no transition point, likely due to ileus. 4. Aortic Atherosclerosis (ICD10-I70.0). Electronically Signed   By: Yetta Glassman M.D.   On: 04/12/2022 13:13   [place] EKG SITE RITE  Result Date: 04/12/2022 If Site Rite image not attached, placement could not be confirmed due to current cardiac rhythm.   Medications / Allergies: per chart  Antibiotics: Anti-infectives (From admission, onward)    Start     Dose/Rate Route Frequency Ordered Stop   04/01/22 2200  cefoTEtan (CEFOTAN) 2 g in sodium chloride 0.9 % 100 mL IVPB        2 g 200 mL/hr over 30 Minutes Intravenous Every 12 hours 04/01/22 1714 04/01/22 2316   04/01/22 1400  neomycin (MYCIFRADIN) tablet 1,000 mg  Status:  Discontinued       See Hyperspace for full Linked Orders Report.   1,000 mg Oral 3 times per day 04/01/22 0756 04/01/22 0807   04/01/22 1400  metroNIDAZOLE (FLAGYL) tablet 1,000 mg  Status:  Discontinued       See Hyperspace for full Linked Orders Report.   1,000 mg Oral 3 times per day 04/01/22 0756 04/01/22 0807   04/01/22 0801  sodium chloride 0.9 % with cefoTEtan (CEFOTAN) ADS Med       Note to Pharmacy: Kyra Leyland E: cabinet override      04/01/22 0801 04/01/22 1015   04/01/22 0800  cefoTEtan (CEFOTAN) 2 g in sodium chloride 0.9 % 100 mL IVPB        2 g 200 mL/hr over 30 Minutes Intravenous On call to O.R. 04/01/22 0756  04/01/22 1030         Note: Portions of this report may have been transcribed using voice recognition software. Every effort was made to ensure accuracy; however, inadvertent computerized transcription errors may be present.   Any transcriptional errors that result from this process are unintentional.    Adin Hector, MD, FACS, MASCRS Esophageal, Gastrointestinal & Colorectal Surgery Robotic and Minimally Invasive Surgery  [facility], [place]  [place]. [address], [place], [place] [address] [phone] Fax [phone] Main  CONTACT INFORMATION:  Weekday (9AM-5PM): Call [facility] main office at [phone]  Weeknight (5PM-9AM) or Weekend/Holiday: Check [email or web address] (password " [email or web address]") for General Surgery [facility] coverage  (Please, do not use SecureChat as it is not reliable communication to operating surgeons for immediate patient care)      04/13/2022  7:18 AM

## 2022-04-13 NOTE — Progress Notes (Signed)
Physical Therapy Treatment Patient Details Name: Regina Roberts MRN: [id number] DOB: 1951/06/29 Today's Date: 04/13/2022   History of Present Illness Patient is a 71 y/o female admitted for colonic fistula takedown performed 04/01/22. Has had issues with N&V with ileus post-op. 6/21 pt s/p 10 Fr drain placed in pelvic collection. PMH: dislipidemia, HTN, Raynauds, GERD, diverticulitis, CKD.    PT Comments    Pt with drain placed earlier today, motivated to improve mobility and wanting to ambulate with therapy this afternoon. Pt with slow, steady gait pattern, pushes IV pole initially then progresses to nothing, takes standing rest breaks as needed due to discomfort at drain site. Therapist managing lines/drain with bed mobility, cued pt on hand placement and sequencing to reduce pressure on drain side and improve pain. Pt ultimately requiring increased time with mobility due to drain site discomfort. Continue to progress as able.   Recommendations for follow up therapy are one component of a multi-disciplinary discharge planning process, led by the attending physician.  Recommendations may be updated based on patient status, additional functional criteria and insurance authorization.  Follow Up Recommendations  No PT follow up     Assistance Recommended at Discharge Intermittent Supervision/Assistance  Patient can return home with the following Help with stairs or ramp for entrance;Assistance with cooking/housework;Assist for transportation   Equipment Recommendations  None recommended by PT    Recommendations for Other Services       Precautions / Restrictions Precautions Precautions: Fall Precaution Comments: drain Restrictions Weight Bearing Restrictions: No     Mobility  Bed Mobility Overal bed mobility: Needs Assistance Bed Mobility: Supine to Sit, Sit to Supine  Supine to sit: Supervision Sit to supine: Supervision  General bed mobility comments: increased time,  therapist managing new drain and lines    Transfers Overall transfer level: Modified independent Equipment used: None  General transfer comment: increased time    Ambulation/Gait Ambulation/Gait assistance: Supervision Gait Distance (Feet): 350 Feet Assistive device: None Gait Pattern/deviations: Step-through pattern, Decreased stride length Gait velocity: decreased  General Gait Details: slow, steady gait pattern, 2 standing rest breaks due to glute/low back discomfort from drain placement earlier today, no overt LOB   Stairs             Wheelchair Mobility    Modified Rankin (Stroke Patients Only)       Balance Overall balance assessment: Needs assistance Sitting-balance support: No upper extremity supported, Feet supported Sitting balance-Leahy Scale: Good  Standing balance support: No upper extremity supported Standing balance-Leahy Scale: Good       Cognition Arousal/Alertness: Awake/alert Behavior During Therapy: WFL for tasks assessed/performed Overall Cognitive Status: Within Functional Limits for tasks assessed       Exercises      General Comments        Pertinent Vitals/Pain Pain Assessment Pain Assessment: Faces Faces Pain Scale: Hurts a little bit Pain Location: "where the drain is" Pain Descriptors / Indicators: Grimacing, Guarding, Sore, Tender Pain Intervention(s): Limited activity within patient's tolerance, Monitored during session    Home Living                          Prior Function            PT Goals (current goals can now be found in the care plan section) Acute Rehab PT Goals Patient Stated Goal: to return to independent PT Goal Formulation: With patient Time For Goal Achievement: 04/25/22 Potential to Achieve Goals: Good  Progress towards PT goals: Progressing toward goals    Frequency    Min 3X/week      PT Plan Current plan remains appropriate    Co-evaluation              AM-PAC PT "6  Clicks" Mobility   Outcome Measure  Help needed turning from your back to your side while in a flat bed without using bedrails?: A Little Help needed moving from lying on your back to sitting on the side of a flat bed without using bedrails?: A Little Help needed moving to and from a bed to a chair (including a wheelchair)?: None Help needed standing up from a chair using your arms (e.g., wheelchair or bedside chair)?: None Help needed to walk in hospital room?: A Little Help needed climbing 3-5 steps with a railing? : A Lot 6 Click Score: 19    End of Session Equipment Utilized During Treatment: Gait belt Activity Tolerance: Patient tolerated treatment well Patient left: in bed;with call bell/phone within reach Nurse Communication: Mobility status PT Visit Diagnosis: Other abnormalities of gait and mobility (R26.89);Muscle weakness (generalized) (M62.81)     Time: 5456-2563 PT Time Calculation (min) (ACUTE ONLY): 26 min  Charges:  $Gait Training: 8-22 mins $Therapeutic Activity: 8-22 mins                      Regina Roberts PT, DPT 04/13/22, 3:21 PM

## 2022-04-13 NOTE — Procedures (Signed)
Interventional Radiology Procedure:   Indications: Post op pelvic fluid collection  Procedure: CT guided drain placement  Findings: 10 Fr drain placed in pelvic collection.  35 ml of dark thick fluid was removed.    Complications: No immediate complications noted.     EBL: Minimal  Plan: Fluid sent for culture.  Follow output.     David Towson R. Anselm Pancoast, MD  Pager: [phone]

## 2022-04-13 NOTE — Progress Notes (Signed)
PT Cancellation Note  Patient Details Name: Regina Roberts MRN: [id number] DOB: 12-27-1950   Cancelled Treatment:    Reason Eval/Treat Not Completed: Other (comment) (just walked with nursing). Pt up in recliner, reports she has walked a couple times with nursing today, just got back from most recent walk and is now resting in chair. Pt politely declines PT.    Talbot Grumbling PT, DPT 04/13/22, 11:44 AM

## 2022-04-13 NOTE — Progress Notes (Signed)
24 hour chart audit completed 

## 2022-04-13 NOTE — Progress Notes (Signed)
Pharmacy Antibiotic Note  Regina Roberts is a 71 y.o. female who is known to pharmacy from current TPN consult.  Abdominal CT on 6/21 showed multiple fluid collections with concern for developing abscess. Pharmacy has been consulted to dose zosyn for infection.   Plan: - zosyn 3.375 gm IV q8h (infuse over 4 hrs) - With stable renal function, pharmacy will sign off for abx consult.  Reconsult [place] if need further assistance.   Height: 4' 10.25" (148 cm) Weight: 85.5 kg (188 lb 7.9 oz) IBW/kg (Calculated) : 41.48  Temp (24hrs), Avg:98.6 F (37 C), Min:98.2 F (36.8 C), Max:98.8 F (37.1 C)  Recent Labs  Lab 04/07/22 0429 04/11/22 0029 04/12/22 0000 04/13/22 0321  WBC 15.2* 21.6* 26.2* 23.2*  CREATININE 0.81 0.78  --  0.81    Estimated Creatinine Clearance: 60.3 mL/min (by C-G formula based on SCr of 0.81 mg/dL).    Allergies  Allergen Reactions   Latex Itching   Lisinopril Cough   Phentermine Other (See Comments)    Raynauld's ? Tachycardia   Iodinated Contrast Media Itching    Ears itch when having contrast.  Will need pre-meds for CT scans with contrast per Radiologist     Thank you for allowing pharmacy to be a part of this patient's care.  Regina Roberts 04/13/2022 1:54 PM

## 2022-04-13 NOTE — Progress Notes (Addendum)
PHARMACY - TOTAL PARENTERAL NUTRITION CONSULT NOTE   Indication: Prolonged ileus  Patient Measurements: Height: 4' 10.25" (148 cm) Weight: 85.5 kg (188 lb 7.9 oz) IBW/kg (Calculated) : 41.48 TPN AdjBW (KG): 51.1 Body mass index is 39.06 kg/m. Usual Weight:   Assessment: 71 yo F  with Colonic fistula s/p takedown & extensive lysis of adhesions 04/01/2022 with prolonged post-operative ileus PMH: HLD, HTN, obesity, Raynaud, GERD, CKD2  Glucose / Insulin: no hx DM, CBGs 138 & 144 after TPN started @ 40 ml/hr; 2 U SSI given Electrolytes: K 3.4; all other lytes WNL including CorrCa Renal: WNL, hx CKD2 Hepatic: LFTs  WNL, Trig 135 6/21 Intake / Output; MIVF:  I/O net -1044,  900 ml stool; 250 ml other, 1000 ml UOP/24 hrs IVF: LR @ 10 ml/hr GI meds: Scheduled oral reglan & zofran started by [facility] 6/20 On PO protonix GI Imaging: 6/18 KUB persistent ileus 6/20 CT A/P: low pelvic fluid collection w/ gas, concern for abscess or leak> IR to place drain 6/21 GI Surgeries / Procedures:  6/9 colostomy takedown & extensive lysis of adhesions 6/21 IR to place drain Central access: PICC placed 6/20 for TPN TPN start date: 6/20  Nutritional Goals: Goal TPN rate is 60 mL/hr (provides 72 g of protein and 1382 kcals per day)  RD Assessment: Estimated Needs Total Energy Estimated Needs: 1300-1500 Total Protein Estimated Needs: 65-80g Total Fluid Estimated Needs: 1.5L/day  Current Nutrition:  Full liquids- NPO 6/21 for drain placement Boost Plus chocolate BID- Each supplement provides 360kcal and 14g protein TPN  Plan:  Now: 4 runs KCL per CCS Increase TPN to goal rate of 60 mL/hr at 1800- this will provide 100% of goals with 72 g protein & 1382 Kcals Electrolytes in TPN: Na 42mq/L, K 53m/L, Ca 3m59mL, Mg 3mE59m, and Phos 13mm12m. Cl:Ac 1:1 Add standard MVI and trace elements to TPN continue Sensitive q6h SSI and adjust as needed  DC MIVF at 1800 or per CCS Monitor TPN labs on Mon/Thurs,  BMET, Mg & Phos in AM  MicheEudelia Bunchrm.D 04/13/2022 7:34 AM

## 2022-04-14 LAB — COMPREHENSIVE METABOLIC PANEL
ALT: 34 U/L (ref 0–44)
AST: 17 U/L (ref 15–41)
Albumin: 2.7 g/dL — ABNORMAL LOW (ref 3.5–5.0)
Alkaline Phosphatase: 65 U/L (ref 38–126)
Anion gap: 6 (ref 5–15)
BUN: 11 mg/dL (ref 8–23)
CO2: 26 mmol/L (ref 22–32)
Calcium: 8.4 mg/dL — ABNORMAL LOW (ref 8.9–10.3)
Chloride: 107 mmol/L (ref 98–111)
Creatinine, Ser: 0.76 mg/dL (ref 0.44–1.00)
GFR, Estimated: >60 mL/min (ref >60)
Glucose, Bld: 128 mg/dL — ABNORMAL HIGH (ref 70–99)
Potassium: 3.7 mmol/L (ref 3.5–5.1)
Sodium: 139 mmol/L (ref 135–145)
Total Bilirubin: 0.6 mg/dL (ref 0.3–1.2)
Total Protein: 6.2 g/dL — ABNORMAL LOW (ref 6.5–8.1)

## 2022-04-14 LAB — MAGNESIUM: Magnesium: 2.1 mg/dL (ref 1.7–2.4)

## 2022-04-14 LAB — GLUCOSE, CAPILLARY
Glucose-Capillary: 130 mg/dL — ABNORMAL HIGH (ref 70–99)
Glucose-Capillary: 119 mg/dL — ABNORMAL HIGH (ref 70–99)
Glucose-Capillary: 145 mg/dL — ABNORMAL HIGH (ref 70–99)
Glucose-Capillary: 125 mg/dL — ABNORMAL HIGH (ref 70–99)

## 2022-04-14 LAB — PHOSPHORUS: Phosphorus: 3.1 mg/dL (ref 2.5–4.6)

## 2022-04-14 LAB — TRIGLYCERIDES: Triglycerides: 143 mg/dL (ref <150)

## 2022-04-14 MED ORDER — POLYETHYLENE GLYCOL 3350 17 G PO PACK
17.0000 g | PACK | Freq: Every day | ORAL | Status: DC
  Filled 2022-04-14: qty 1

## 2022-04-14 MED ORDER — METHOCARBAMOL 500 MG PO TABS
1000.0000 mg | ORAL_TABLET | Freq: Four times a day (QID) | ORAL | Status: DC
  Administered 2022-04-14 – 2022-04-18 (×17): 1000 mg via ORAL
  Filled 2022-04-14 (×17): qty 2

## 2022-04-14 MED ORDER — PROCHLORPERAZINE EDISYLATE 10 MG/2ML IJ SOLN: 5.0000 mg | INTRAMUSCULAR | Status: DC | PRN

## 2022-04-14 MED ORDER — PROCHLORPERAZINE MALEATE 5 MG PO TABS: 5.0000 mg | ORAL_TABLET | Freq: Four times a day (QID) | ORAL | Status: DC | PRN

## 2022-04-14 MED ORDER — ASCORBIC ACID 500 MG PO TABS
500.0000 mg | ORAL_TABLET | Freq: Every day | ORAL | Status: DC
  Administered 2022-04-14 – 2022-04-18 (×5): 500 mg via ORAL
  Filled 2022-04-14 (×5): qty 1

## 2022-04-14 MED ORDER — LIDOCAINE 5 % EX PTCH
1.0000 | MEDICATED_PATCH | CUTANEOUS | Status: DC
  Administered 2022-04-14 – 2022-04-15 (×2): 1 via TRANSDERMAL
  Filled 2022-04-14 (×5): qty 1

## 2022-04-14 MED ORDER — GABAPENTIN 300 MG PO CAPS: 300.0000 mg | ORAL_CAPSULE | Freq: Every day | ORAL | Status: DC

## 2022-04-14 MED ORDER — ONDANSETRON HCL 4 MG PO TABS: 4.0000 mg | ORAL_TABLET | Freq: Four times a day (QID) | ORAL | Status: DC | PRN

## 2022-04-14 MED ORDER — OXYCODONE HCL 5 MG PO TABS
5.0000 mg | ORAL_TABLET | ORAL | Status: DC | PRN
  Administered 2022-04-14: 10 mg via ORAL
  Administered 2022-04-15: 5 mg via ORAL
  Administered 2022-04-16: 10 mg via ORAL
  Filled 2022-04-14: qty 2
  Filled 2022-04-14: qty 1
  Filled 2022-04-14 (×2): qty 2

## 2022-04-14 MED ORDER — TRAVASOL 10 % IV SOLN
INTRAVENOUS | Status: AC
  Filled 2022-04-14: qty 720

## 2022-04-14 MED ORDER — GABAPENTIN 100 MG PO CAPS
200.0000 mg | ORAL_CAPSULE | Freq: Three times a day (TID) | ORAL | Status: DC
  Administered 2022-04-14 – 2022-04-18 (×13): 200 mg via ORAL
  Filled 2022-04-14 (×13): qty 2

## 2022-04-14 MED ORDER — POTASSIUM CHLORIDE 10 MEQ/50ML IV SOLN
10.0000 meq | INTRAVENOUS | Status: AC
  Administered 2022-04-14 (×2): 10 meq via INTRAVENOUS
  Filled 2022-04-14 (×2): qty 50

## 2022-04-14 MED ORDER — SODIUM CHLORIDE 0.9 % IV SOLN: 8.0000 mg | Freq: Four times a day (QID) | INTRAVENOUS | Status: DC | PRN

## 2022-04-14 MED ORDER — ONDANSETRON HCL 4 MG/2ML IJ SOLN: 4.0000 mg | Freq: Four times a day (QID) | INTRAMUSCULAR | Status: DC | PRN

## 2022-04-14 NOTE — Progress Notes (Signed)
PHARMACY - TOTAL PARENTERAL NUTRITION CONSULT NOTE   Indication: Prolonged ileus  Patient Measurements: Height: 4' 10.25" (148 cm) Weight: 85.5 kg (188 lb 7.9 oz) IBW/kg (Calculated) : 41.48 TPN AdjBW (KG): 51.1 Body mass index is 39.06 kg/m. Usual Weight:   Assessment: 71 yo F  with Colonic fistula s/p takedown & extensive lysis of adhesions 04/01/2022 with prolonged post-operative ileus PMH: HLD, HTN, obesity, Raynaud, GERD, CKD2  Glucose / Insulin: no hx DM, all CBGs < 150 on goal rate TPN> DC SSI & CBGS 6/22 Electrolytes: K 3.7 after 4 runs yesterday goal > 4 for ileus; all other lytes WNL including CorrCa Renal: WNL, hx CKD2 Hepatic: LFTs  WNL, Trig 143 6/22 Intake / Output; MIVF:  I/O net 939,  0 ml stool recorded but 2 x occurrences; 60 ml drain; 750 ml UOP/24 hrs IVF: none GI meds: Scheduled oral reglan x 5 days & zofran x 2 days started by [facility] 6/20 On PO protonix GI Imaging: 6/18 KUB persistent ileus 6/20 CT A/P: low pelvic fluid collection w/ gas, concern for abscess or leak GI Surgeries / Procedures:  6/9 colostomy takedown & extensive lysis of adhesions 6/21 pelvic drain placed by IR Central access: PICC placed 6/20 for TPN TPN start date: 6/20  Nutritional Goals: Goal TPN rate is 60 mL/hr (provides 72 g of protein and 1382 kcals per day)  RD Assessment: Estimated Needs Total Energy Estimated Needs: 1300-1500 Total Protein Estimated Needs: 65-80g Total Fluid Estimated Needs: 1.5L/day  Current Nutrition:  Full liquids Boost Plus chocolate BID- Each supplement provides 360kcal and 14g protein- refusing TPN  Plan:  Now: 2 runs KCL Continue TPN @ goal rate of 60 mL/hr -this provides 100% of goals with 72 g protein & 1382 Kcals Electrolytes in TPN: Na 89mq/L, K 541m/L, Ca 87m1mL, Mg 87mE48m, and Phos 187mm1m. Cl:Ac 1:1 Add standard MVI and trace elements to TPN DC SSI & CBGs  No IVF Monitor TPN labs on Mon/Thurs  MicheEudelia Bunchrm.D 04/14/2022 7:09  AM

## 2022-04-14 NOTE — Progress Notes (Signed)
Pt. Had 3 loose watery BM's, no abd. Pain,no bloating. On call md notfied.

## 2022-04-14 NOTE — Progress Notes (Signed)
Nutrition Follow-up  DOCUMENTATION CODES:   Obesity unspecified  INTERVENTION:   -TPN management per Pharmacy   -Boost Plus chocolate BID- Each supplement provides 360kcal and 14g protein.   -Mightyshakes with every meal, provides 330 kcals and 9g protein   NUTRITION DIAGNOSIS:   Increased nutrient needs related to post-op healing as evidenced by estimated needs.  Ongoing.  GOAL:   Patient will meet greater than or equal to 90% of their needs  Progressing.  MONITOR:   PO intake, Supplement acceptance, Diet advancement, Labs, Weight trends, I & O's  REASON FOR ASSESSMENT:   Consult Assessment of nutrition requirement/status  ASSESSMENT:   71 y.o. female patient with history of colostomy and colon resection in 2022. 6/9 s/p colostomy takedown.  Patient now on soft diet. States she ate pot roast and some mashed potatoes for lunch. Feels better today but still seems to be having pain where her drain is. Pt reports feeling depressed but was able to talk to [facility] from radiology which helped. Pt states she has been drinking her Boost Plus today, two so far. Wants to have Mightyshakes added to her meals, will add these as pt enjoys them and they provide additional kcals and protein.  TPN continues to infuse at 60 ml/hr (providing 1382 kcals and 72g protein).  Admission weight: 175 lbs. Last weight 6/18: 188 lbs  Medications: Vitamin C, Reglan, Miralax,  Labs reviewed: CBGs: 119-145  Diet Order:   Diet Order             DIET SOFT Room service appropriate? Yes; Fluid consistency: Thin  Diet effective now           Diet - low sodium heart healthy                   EDUCATION NEEDS:   No education needs have been identified at this time  Skin:  Skin Assessment: Skin Integrity Issues: Skin Integrity Issues:: Incisions Incisions: 6/9 abdomen  Last BM:  6/22 -type 7  Height:   Ht Readings from Last 1 Encounters:  04/01/22 4' 10.25" (1.48 m)     Weight:   Wt Readings from Last 1 Encounters:  04/10/22 85.5 kg    BMI:  Body mass index is 39.06 kg/m.  Estimated Nutritional Needs:   Kcal:  1300-1500  Protein:  65-80g  Fluid:  1.5L/day   Clayton Bibles, MS, RD, LDN Inpatient Clinical Dietitian Contact information available via Amion

## 2022-04-14 NOTE — Progress Notes (Signed)
Dressing changed, drain flushed.

## 2022-04-15 ENCOUNTER — Other Ambulatory Visit: Payer: Self-pay | Admitting: Student

## 2022-04-15 ENCOUNTER — Other Ambulatory Visit (HOSPITAL_COMMUNITY): Payer: Self-pay

## 2022-04-15 LAB — CBC
HCT: 24.5 % — ABNORMAL LOW (ref 36.0–46.0)
Hemoglobin: 7.7 g/dL — ABNORMAL LOW (ref 12.0–15.0)
MCH: 25.2 pg — ABNORMAL LOW (ref 26.0–34.0)
MCHC: 31.4 g/dL (ref 30.0–36.0)
MCV: 80.1 fL (ref 80.0–100.0)
Platelets: 602 10*3/uL — ABNORMAL HIGH (ref 150–400)
RBC: 3.06 MIL/uL — ABNORMAL LOW (ref 3.87–5.11)
RDW: 19.5 % — ABNORMAL HIGH (ref 11.5–15.5)
WBC: 18.4 10*3/uL — ABNORMAL HIGH (ref 4.0–10.5)
nRBC: 0.3 % — ABNORMAL HIGH (ref 0.0–0.2)

## 2022-04-15 LAB — GLUCOSE, CAPILLARY
Glucose-Capillary: 119 mg/dL — ABNORMAL HIGH (ref 70–99)
Glucose-Capillary: 166 mg/dL — ABNORMAL HIGH (ref 70–99)

## 2022-04-15 MED ORDER — TRAVASOL 10 % IV SOLN
INTRAVENOUS | Status: DC
  Filled 2022-04-15: qty 720

## 2022-04-15 MED ORDER — CALCIUM POLYCARBOPHIL 625 MG PO TABS
625.0000 mg | ORAL_TABLET | Freq: Two times a day (BID) | ORAL | Status: DC
  Administered 2022-04-15 – 2022-04-18 (×7): 625 mg via ORAL
  Filled 2022-04-15 (×7): qty 1

## 2022-04-15 MED ORDER — TAB-A-VITE/IRON PO TABS
1.0000 | ORAL_TABLET | Freq: Every day | ORAL | Status: DC
  Administered 2022-04-15 – 2022-04-18 (×4): 1 via ORAL
  Filled 2022-04-15 (×4): qty 1

## 2022-04-15 MED ORDER — METOCLOPRAMIDE HCL 5 MG PO TABS: 5.0000 mg | ORAL_TABLET | Freq: Four times a day (QID) | ORAL | Status: DC | PRN

## 2022-04-15 MED ORDER — SODIUM CHLORIDE 0.9% FLUSH
5.0000 mL | Freq: Every day | INTRAVENOUS | 0 refills | Status: AC
  Filled 2022-04-15: qty 150, 30d supply, fill #0

## 2022-04-16 LAB — GLUCOSE, CAPILLARY: Glucose-Capillary: 132 mg/dL — ABNORMAL HIGH (ref 70–99)

## 2022-04-16 LAB — BASIC METABOLIC PANEL
Anion gap: 8 (ref 5–15)
BUN: 14 mg/dL (ref 8–23)
CO2: 23 mmol/L (ref 22–32)
Calcium: 8.6 mg/dL — ABNORMAL LOW (ref 8.9–10.3)
Chloride: 105 mmol/L (ref 98–111)
Creatinine, Ser: 0.7 mg/dL (ref 0.44–1.00)
GFR, Estimated: >60 mL/min (ref >60)
Glucose, Bld: 119 mg/dL — ABNORMAL HIGH (ref 70–99)
Potassium: 4.1 mmol/L (ref 3.5–5.1)
Sodium: 136 mmol/L (ref 135–145)

## 2022-04-16 LAB — AEROBIC/ANAEROBIC CULTURE W GRAM STAIN (SURGICAL/DEEP WOUND)

## 2022-04-16 MED ORDER — TRAVASOL 10 % IV SOLN
INTRAVENOUS | Status: AC
  Filled 2022-04-16: qty 720

## 2022-04-16 MED ORDER — TRAVASOL 10 % IV SOLN
INTRAVENOUS | Status: AC
  Filled 2022-04-16: qty 360

## 2022-04-17 LAB — BASIC METABOLIC PANEL
Anion gap: 8 (ref 5–15)
BUN: 18 mg/dL (ref 8–23)
CO2: 26 mmol/L (ref 22–32)
Calcium: 9.3 mg/dL (ref 8.9–10.3)
Chloride: 104 mmol/L (ref 98–111)
Creatinine, Ser: 0.84 mg/dL (ref 0.44–1.00)
GFR, Estimated: >60 mL/min (ref >60)
Glucose, Bld: 102 mg/dL — ABNORMAL HIGH (ref 70–99)
Potassium: 4.3 mmol/L (ref 3.5–5.1)
Sodium: 138 mmol/L (ref 135–145)

## 2022-04-17 LAB — CREATININE, SERUM
Creatinine, Ser: 0.99 mg/dL (ref 0.44–1.00)
GFR, Estimated: >60 mL/min (ref >60)

## 2022-04-17 LAB — PHOSPHORUS: Phosphorus: 4 mg/dL (ref 2.5–4.6)

## 2022-04-17 LAB — MAGNESIUM: Magnesium: 2.2 mg/dL (ref 1.7–2.4)

## 2022-04-18 ENCOUNTER — Other Ambulatory Visit (HOSPITAL_COMMUNITY): Payer: Self-pay

## 2022-04-18 MED ORDER — AMOXICILLIN-POT CLAVULANATE 875-125 MG PO TABS
1.0000 | ORAL_TABLET | Freq: Two times a day (BID) | ORAL | 0 refills | Status: AC
  Filled 2022-04-18: qty 10, 5d supply, fill #0

## 2022-04-18 MED ORDER — OXYCODONE HCL 5 MG PO TABS
5.0000 mg | ORAL_TABLET | ORAL | 0 refills | Status: DC | PRN
  Filled 2022-04-18: qty 30, 3d supply, fill #0

## 2022-04-18 NOTE — Progress Notes (Signed)
Pt discharge instructions reviewed including wound and drain care.  Pt had no questions at time of review.  Pt discharged to home via personal vehicle family transport.  Ulis Rias, RN 04/18/2022 3:25 PM

## 2022-04-19 ENCOUNTER — Other Ambulatory Visit: Payer: Self-pay | Admitting: Surgery

## 2022-04-19 DIAGNOSIS — R188 Other ascites: Secondary | ICD-10-CM

## 2022-04-20 DIAGNOSIS — I129 Hypertensive chronic kidney disease with stage 1 through stage 4 chronic kidney disease, or unspecified chronic kidney disease: Secondary | ICD-10-CM | POA: Diagnosis not present

## 2022-04-20 DIAGNOSIS — Z4801 Encounter for change or removal of surgical wound dressing: Secondary | ICD-10-CM | POA: Diagnosis not present

## 2022-04-20 DIAGNOSIS — D631 Anemia in chronic kidney disease: Secondary | ICD-10-CM | POA: Diagnosis not present

## 2022-04-20 DIAGNOSIS — Z48815 Encounter for surgical aftercare following surgery on the digestive system: Secondary | ICD-10-CM | POA: Diagnosis not present

## 2022-04-20 DIAGNOSIS — N182 Chronic kidney disease, stage 2 (mild): Secondary | ICD-10-CM | POA: Diagnosis not present

## 2022-04-21 ENCOUNTER — Telehealth: Payer: Self-pay | Admitting: Radiology

## 2022-04-21 NOTE — Progress Notes (Signed)
   Called pre medication Rx to [place] at [phone]  CT to be done 7/11 at 120 pm  Prednisone 50 mg 13 hr:  1220 am 7/11 7 hr:  620 am 7/11 1 hr:  1220 pm 7/11  Benadryl 50 mg 1 hr : 1220 pm  Pt is aware of instructions

## 2022-04-28 DIAGNOSIS — D631 Anemia in chronic kidney disease: Secondary | ICD-10-CM | POA: Diagnosis not present

## 2022-04-28 DIAGNOSIS — Z48815 Encounter for surgical aftercare following surgery on the digestive system: Secondary | ICD-10-CM | POA: Diagnosis not present

## 2022-04-28 DIAGNOSIS — Z4801 Encounter for change or removal of surgical wound dressing: Secondary | ICD-10-CM | POA: Diagnosis not present

## 2022-04-28 DIAGNOSIS — N182 Chronic kidney disease, stage 2 (mild): Secondary | ICD-10-CM | POA: Diagnosis not present

## 2022-04-28 DIAGNOSIS — I129 Hypertensive chronic kidney disease with stage 1 through stage 4 chronic kidney disease, or unspecified chronic kidney disease: Secondary | ICD-10-CM | POA: Diagnosis not present

## 2022-05-02 ENCOUNTER — Other Ambulatory Visit: Payer: Self-pay | Admitting: Internal Medicine

## 2022-05-02 DIAGNOSIS — N182 Chronic kidney disease, stage 2 (mild): Secondary | ICD-10-CM | POA: Diagnosis not present

## 2022-05-02 DIAGNOSIS — Z4801 Encounter for change or removal of surgical wound dressing: Secondary | ICD-10-CM | POA: Diagnosis not present

## 2022-05-02 DIAGNOSIS — I129 Hypertensive chronic kidney disease with stage 1 through stage 4 chronic kidney disease, or unspecified chronic kidney disease: Secondary | ICD-10-CM | POA: Diagnosis not present

## 2022-05-02 DIAGNOSIS — Z48815 Encounter for surgical aftercare following surgery on the digestive system: Secondary | ICD-10-CM | POA: Diagnosis not present

## 2022-05-02 DIAGNOSIS — D631 Anemia in chronic kidney disease: Secondary | ICD-10-CM | POA: Diagnosis not present

## 2022-05-02 MED ORDER — HYDROCHLOROTHIAZIDE 12.5 MG PO TABS: ORAL_TABLET | ORAL | 1 refills | Status: DC

## 2022-05-02 NOTE — Telephone Encounter (Signed)
1st Transmission to pharmacy failed (05/02/2022  8:31 AM EDT). Resent HCTZ../l,mb

## 2022-05-02 NOTE — Addendum Note (Signed)
Addended by: Earnstine Regal on: 05/02/2022 08:41 AM   Modules accepted: Orders

## 2022-05-03 ENCOUNTER — Ambulatory Visit
Admission: RE | Admit: 2022-05-03 | Discharge: 2022-05-03 | Disposition: A | Discharge status: Home / Self Care | Payer: Medicare Other | Source: Ambulatory Visit | Attending: Student | Admitting: Student

## 2022-05-03 ENCOUNTER — Encounter: Payer: Self-pay | Admitting: *Deleted

## 2022-05-03 ENCOUNTER — Ambulatory Visit
Admission: RE | Admit: 2022-05-03 | Discharge: 2022-05-03 | Disposition: A | Discharge status: Home / Self Care | Payer: Medicare Other | Source: Ambulatory Visit | Attending: Surgery | Admitting: Surgery

## 2022-05-03 DIAGNOSIS — R188 Other ascites: Secondary | ICD-10-CM

## 2022-05-03 DIAGNOSIS — Z8719 Personal history of other diseases of the digestive system: Secondary | ICD-10-CM | POA: Diagnosis not present

## 2022-05-03 DIAGNOSIS — Z4682 Encounter for fitting and adjustment of non-vascular catheter: Secondary | ICD-10-CM | POA: Diagnosis not present

## 2022-05-03 DIAGNOSIS — N281 Cyst of kidney, acquired: Secondary | ICD-10-CM | POA: Diagnosis not present

## 2022-05-03 DIAGNOSIS — Z933 Colostomy status: Secondary | ICD-10-CM | POA: Diagnosis not present

## 2022-05-03 HISTORY — PX: IR RADIOLOGIST EVAL & MGMT: IMG5224

## 2022-05-03 MED ORDER — IOPAMIDOL (ISOVUE-300) INJECTION 61%
100.0000 mL | Freq: Once | INTRAVENOUS | Status: AC | PRN
  Administered 2022-05-03: 100 mL via INTRAVENOUS

## 2022-05-03 NOTE — Progress Notes (Signed)
Chief Complaint: Patient was seen in consultation today for pelvic drain follow up at the request of Michael Boston  Referring Physician(s): Johney Maine, Remo Lipps  Supervising Physician: Jacqulynn Cadet  History of Present Illness: Regina Roberts is a 71 y.o. female with PMH of HTN, HLD, GERD, and recent diagnosis of perforated diverticulitis s/p multiple abdominal surgery who developed post op pelvic fluid  abscess, s/p L TG drain placement by Dr. Anselm Pancoast on 04/13/22. Patient was discharged home with home health in stable condition on 04/18/22.   Patient  presents to IR drain clinic today for follow-up.  She state she is doing ok, dealing with the drain has been challenging but she has no other complaints such as abdominal pain, N/V, fever or chills.  Patient has not hed follow up visit with general surgery, she is scheduled to be seen after IR drain follow up.  Patient is currently not on antibiotic treatment, patient is flushing the drain. Output has been zero for several days.     Past Medical History:  Diagnosis Date   Allergy    sesonal   Blood transfusion without reported diagnosis 1984   Diverticulitis of colon with perforation 02/28/2022   GERD (gastroesophageal reflux disease)    Hyperlipidemia    Hypertension    Osteopenia 10/2018   T score -1.8 FRAX 3.4% / 0.3% able from prior DEXA    Past Surgical History:  Procedure Laterality Date   HERNIA REPAIR  2000   LAPAROTOMY N/A 03/20/2021   Procedure: LAPAROTOMY;  Surgeon: Clovis Riley, MD;  Location: [facility];  Service: General;  Laterality: N/A;  [place] N/A 04/01/2022   Procedure: LYSIS OF ADHESION;  Surgeon: Michael Boston, MD;  Location: [facility];  Service: General;  Laterality: N/A;   PARTIAL COLECTOMY N/A 03/20/2021   Procedure: PARTIAL COLECTOMY WITH COLOSTOMY;  Surgeon: Clovis Riley, MD;  Location: [facility];  Service: General;  Laterality: N/A;   [place]   PROCTOSCOPY N/A  04/01/2022   Procedure: RIGID PROCTOSCOPY;  Surgeon: Michael Boston, MD;  Location: [facility];  Service: General;  Laterality: N/A;   SMALL BOWEL REPAIR  04/01/2022   Procedure: SMALL BOWEL REPAIR;  Surgeon: Michael Boston, MD;  Location: [facility];  Service: General;;   SUPRACERVICAL ABDOMINAL HYSTERECTOMY  2003   with RSO. Leiomyoma menorrhagia with 16-18 week size uterus   XI ROBOTIC ASSISTED COLOSTOMY TAKEDOWN N/A 04/01/2022   Procedure: ROBOTIC OSTOMY TAKEDOWN, TAKEDOWN COLORECTAL STUMP FISTULA, VAGINAL CUFF REPAIR, AND BILATERAL TAP BLOCK;  Surgeon: Michael Boston, MD;  Location: [facility];  Service: General;  Laterality: N/A;    Allergies: Latex, Lisinopril, Phentermine, and Iodinated contrast media  Medications: Prior to Admission medications   Medication Sig Start Date End Date Taking? Authorizing Provider  atorvastatin (LIPITOR) 10 MG tablet Take 1 tablet (10 mg total) by mouth daily. Annual appt due in Nov must see provider for future refills 05/02/22   Plotnikov, Evie Lacks, MD  b complex vitamins tablet Take 1 tablet by mouth daily. 05/16/17   Plotnikov, Evie Lacks, MD  CALCIUM PO Take 1 tablet by mouth daily.    [provider]  Cholecalciferol (VITAMIN D-3) 1000 UNITS CAPS Take 1,000 Units by mouth daily.    [provider]  Cyanocobalamin ([employer] VITAMIN B12 PO) Take 1 tablet by mouth daily.    [provider]  hydrochlorothiazide (HYDRODIURIL) 12.5 MG tablet TAKE 1 TABLET(12.5 MG) BY MOUTH DAILY Annual appt due  in Nov must see provider for future refills 05/02/22   Plotnikov, Evie Lacks, MD  losartan (COZAAR) 100 MG tablet Take 1 tablet (100 mg total) by mouth daily. Annual appt due in Nov must see provider for future refills 05/02/22   Plotnikov, Evie Lacks, MD  montelukast (SINGULAIR) 10 MG tablet Take 1 tablet (10 mg total) by mouth daily. Annual appt due in Nov must see provider for future refills 05/02/22   Plotnikov, Evie Lacks, MD  Multiple Vitamins-Minerals (CENTRUM  ADULTS) TABS Take 1 tablet by mouth daily.    [provider]  naproxen sodium (ANAPROX) 220 MG tablet Take 220 mg by mouth as needed (headache or pain).    [provider]  Omega-3 Fatty Acids (FISH OIL PO) Take 1 capsule by mouth daily.    [provider]  oxyCODONE (OXY IR/ROXICODONE) 5 MG immediate release tablet Take 1 to 2 tablets by mouth every 4  hours as needed for moderate, severe or breakthrough pain. 04/18/22   Michael Boston, MD  pantoprazole (PROTONIX) 40 MG tablet Take 1 tablet (40 mg total) by mouth daily. 05/04/21   Plotnikov, Evie Lacks, MD  sodium chloride flush (NS) 0.9 % SOLN use 5 mLs by Intracatheter route daily 04/15/22 05/18/22  Taler Kushner H, PA-C  triamcinolone ointment (KENALOG) 0.5 % apply to affected area twice a day Patient taking differently: Apply 1 application  topically once as needed (itching). 02/19/20   Plotnikov, Evie Lacks, MD     Family History  Problem Relation Age of Onset   Heart disease Mother 70   Cancer Mother 24       neck ca    Stroke Father 12   Other Brother        train accident   Other Brother        respiratory   Other Brother        liver failure ?   Breast cancer Maternal Grandmother        untreated   Other Daughter        suicide   Pancreatic cancer Neg Hx    Rectal cancer Neg Hx    Stomach cancer Neg Hx     Social History   Socioeconomic History   Marital status: Married    Spouse name: Not on file   Number of children: 1   Years of education: Not on file   Highest education level: Not on file  Occupational History   Occupation: retired  Tobacco Use   Smoking status: Never   Smokeless tobacco: Never  Vaping Use   Vaping Use: Never used  Substance and Sexual Activity   Alcohol use: Not Currently   Drug use: No   Sexual activity: Not Currently    Partners: Male    Birth control/protection: Surgical    Comment: 1st intercourse 18 yo-5 partners, hysterectomy  Other Topics Concern   Not on  file  Social History Narrative   Not on file   Social Determinants of Health   Financial Resource Strain: Low Risk  (03/16/2022)   Overall Financial Resource Strain (CARDIA)    Difficulty of Paying Living Expenses: Not hard at all  Food Insecurity: No Food Insecurity (03/16/2022)   Hunger Vital Sign    Worried About Running Out of Food in the Last Year: Never true    [place] in the Last Year: Never true  Transportation Needs: No Transportation Needs (03/16/2022)   PRAPARE - Transportation    Lack  of Transportation (Medical): No    Lack of Transportation (Non-Medical): No  Physical Activity: Inactive (03/16/2022)   Exercise Vital Sign    Days of Exercise per Week: 0 days    Minutes of Exercise per Session: 0 min  Stress: No Stress Concern Present (03/16/2022)   [place]    Feeling of Stress : Not at all  Social Connections: [place] (03/16/2022)   Social Connection and Isolation Panel [NHANES]    Frequency of Communication with Friends and Family: More than three times a week    Frequency of Social Gatherings with Friends and Family: Once a week    Attends Religious Services: More than 4 times per year    Active Member of [employer] or Organizations: Yes    Attends Music therapist: More than 4 times per year    Marital Status: Married     Review of Systems: A 12 point ROS discussed and pertinent positives are indicated in the HPI above.  All other systems are negative.  Vital Signs: There were no vitals taken for this visit.  Physical Exam Vitals reviewed.  Constitutional:      General: She is not in acute distress.    Appearance: She is not ill-appearing.  Pulmonary:     Effort: Pulmonary effort is normal.  Skin:    General: Skin is warm and dry.     Coloration: Skin is not jaundiced.     Comments: Positive L TG drain to a suction bulb. Site is unremarkable with no erythema,  edema, tenderness, bleeding or drainage. Suture and stat lock in place. Dressing is clean, dry, and intact. No fluid noted in the bulb.   Neurological:     Mental Status: She is alert and oriented to person, place, and time.  Psychiatric:        Mood and Affect: Mood normal.        Behavior: Behavior normal.      Imaging: CT IMAGE GUIDED DRAINAGE PERCUT CATH  PERITONEAL RETROPERIT  Result Date: 04/13/2022 INDICATION: 71 year old with a complex surgical history including colostomy takedown, low anterior rectosigmoid resection and lysis of adhesions. Patient has a postoperative fluid collection and request for fluid sampling and drain placement. EXAM: CT-GUIDED DRAIN PLACEMENT IN PELVIC FLUID COLLECTION TECHNIQUE: Multidetector CT imaging of the pelvis was performed following the standard protocol without IV contrast. RADIATION DOSE REDUCTION: This exam was performed according to the departmental dose-optimization program which includes automated exposure control, adjustment of the mA and/or kV according to patient size and/or use of iterative reconstruction technique. MEDICATIONS: Moderate sedation ANESTHESIA/SEDATION: Moderate (conscious) sedation was employed during this procedure. A total of Versed 2.0 mg and Fentanyl 100 mcg was administered intravenously by the radiology nurse. Total intra-service moderate Sedation Time: 21 minutes. The patient's level of consciousness and vital signs were monitored continuously by radiology nursing throughout the procedure under my direct supervision. COMPLICATIONS: None immediate. PROCEDURE: Informed written consent was obtained from the patient after a thorough discussion of the procedural risks, benefits and alternatives. All questions were addressed. A timeout was performed prior to the initiation of the procedure. Patient was placed prone on the CT scanner. Images through the pelvis were obtained. The fluid collection anterior to the rectum was identified and  targeted. Left transgluteal approach was felt to be the best option for drainage. The left buttock was prepped with chlorhexidine and sterile field was created. Maximal barrier sterile technique was utilized including caps,  mask, sterile gowns, sterile gloves, sterile drape, hand hygiene and skin antiseptic. Skin was anesthetized with 1% lidocaine. Small incision was made. Using CT guidance, an 18 gauge trocar needle was directed into the fluid collection and dark thick fluid was aspirated. Superstiff Amplatz wire was advanced into the collection. The tract was dilated to accommodate a 10 [place] multipurpose drain. 35 mL fluid was obtained. Fluid was sent for culture. Drain was sutured to skin and attached to a suction bulb. Dressing was placed. FINDINGS: Again noted is a small fluid collection in the pelvis anterior to the rectum. 48 French drain was successfully placed within the collection using a left transgluteal approach. 35 mL of dark thick fluid was aspirated. IMPRESSION: CT-guided placement of a drainage catheter within the pelvic fluid collection. Fluid was sent for culture. Electronically Signed   By: Markus Daft M.D.   On: 04/13/2022 16:49   CT ABDOMEN PELVIS W CONTRAST  Result Date: 04/12/2022 CLINICAL DATA:  Postop abdominal pain; colonic fistula s/p takedown 04/01/22 EXAM: CT ABDOMEN AND PELVIS WITH CONTRAST TECHNIQUE: Multidetector CT imaging of the abdomen and pelvis was performed using the standard protocol following bolus administration of intravenous contrast. RADIATION DOSE REDUCTION: This exam was performed according to the departmental dose-optimization program which includes automated exposure control, adjustment of the mA and/or kV according to patient size and/or use of iterative reconstruction technique. CONTRAST:  166m OMNIPAQUE IOHEXOL 300 MG/ML  SOLN COMPARISON:  CT abdomen and pelvis dated January 20, 2022 FINDINGS: Lower chest: No acute abnormality. Hepatobiliary: No focal liver  abnormality is seen. No gallstones, gallbladder wall thickening, or biliary dilatation. Pancreas: Unremarkable. No pancreatic ductal dilatation or surrounding inflammatory changes. Spleen: Normal in size without focal abnormality. Adrenals/Urinary Tract: Bilateral adrenal glands are unremarkable. Mild left hydronephrosis with caliber change at the ureteropelvic junction, unchanged when compared with prior and likely due to chronic UPJ obstruction. Unchanged minimally complex lower pole right renal cyst with associated punctate calcification. Stomach/Bowel: Status post takedown of left lower quadrant colostomy. Multiple rim enhancing collections containing fluid and air are seen in the pelvis adjacent to the anastomosis. Fluid and air extends superiorly into the peritoneum on series 2 image 51-46, concerning for colonic peritoneal fistula. Largest fluid collection is located inferior to the anastomosis and measures 4.8 x 4.1 cm on series 2, image 63 and has associated small locules of air. Additional focal fluid of the right lower quadrant seen on series 2, image 49. Dilated distal small bowel loops are seen with no transition point. Vascular/Lymphatic: Aortic atherosclerosis. No enlarged abdominal or pelvic lymph nodes. Reproductive: Status post hysterectomy. Other: Postsurgical changes of the anterior abdominal wall with subcutaneous soft tissue gas, likely postsurgical. Musculoskeletal: No acute or significant osseous findings. IMPRESSION: 1. Status post takedown of left lower quadrant colostomy. Multiple rim enhancing collections containing fluid and air are seen in the pelvis with extension superiorly to into the peritoneum, findings are concerning for contained anastomotic leak with evidence of possible fistula. Repeat CT with contrast enema could provide more definitive characterization. 2. Additional focal fluid collection is seen in the right lower quadrant concerning for developing abscess. 3. Dilated  distal small bowel loops are seen with no transition point, likely due to ileus. 4. Aortic Atherosclerosis (ICD10-I70.0). Electronically Signed   By: LYetta GlassmanM.D.   On: 04/12/2022 13:13   [place] SITE RITE  Result Date: 04/12/2022 If Site Rite image not attached, placement could not be confirmed due to current cardiac rhythm.  DG Abd  Portable 2V  Result Date: 04/10/2022 CLINICAL DATA:  Ileus following gastrointestinal surgery. EXAM: PORTABLE ABDOMEN - 2 VIEW COMPARISON:  One-view abdomen 04/04/2022 FINDINGS: Mild gaseous distension of small bowel remains. Gas is present throughout the colon. No free air evident on supine scratched at no free air is present. Axial skeleton is within normal limits. IMPRESSION: Persistent mild gaseous distension of small bowel, compatible with ileus. Electronically Signed   By: San Morelle M.D.   On: 04/10/2022 12:13   DG Abd 1 View  Result Date: 04/04/2022 CLINICAL DATA:  Enteric tube placement EXAM: ABDOMEN - 1 VIEW COMPARISON:  03/15/2021 abdominal radiograph FINDINGS: Enteric tube terminates in the proximal body of the stomach. Several mildly dilated small bowel loops throughout the abdomen. Right lower quadrant approach surgical drain terminates in the left abdomen. No evidence of pneumatosis or pneumoperitoneum. Clear lung bases. Subcutaneous emphysema in the lateral abdominal wall bilaterally. No radiopaque nephrolithiasis. IMPRESSION: 1. Enteric tube terminates in the proximal body of the stomach. 2. Surgical drain terminates in the left abdomen. 3. Mildly dilated small bowel loops throughout the abdomen, compatible with mild postoperative adynamic ileus. Electronically Signed   By: Ilona Sorrel M.D.   On: 04/04/2022 12:12    Labs:  CBC: Recent Labs    04/11/22 0029 04/12/22 0000 04/13/22 0321 04/15/22 0303  WBC 21.6* 26.2* 23.2* 18.4*  HGB 7.4* 8.1* 7.2* 7.7*  HCT 23.2* 25.4* 22.7* 24.5*  PLT 507* 563* 534* 602*    COAGS: Recent  Labs    04/13/22 0321  INR 1.2    BMP: Recent Labs    04/13/22 0321 04/14/22 0524 04/16/22 0327 04/17/22 0321 04/17/22 2252  NA 140 139 136 138  --   K 3.4* 3.7 4.1 4.3  --   CL 108 107 105 104  --   CO2 '27 26 23 26  '$ --   GLUCOSE 139* 128* 119* 102*  --   BUN '9 11 14 18  '$ --   CALCIUM 8.3* 8.4* 8.6* 9.3  --   CREATININE 0.81 0.76 0.70 0.84 0.99  GFRNONAA >60 >60 >60 >60 >60    LIVER FUNCTION TESTS: Recent Labs    09/08/21 1128 04/13/22 0321 04/14/22 0524  BILITOT 0.4 0.5 0.6  AST '11 16 17  '$ ALT 13 35 34  ALKPHOS 68 58 65  PROT 7.7 5.9* 6.2*  ALBUMIN 4.2 2.7* 2.7*    TUMOR MARKERS: No results for input(s): "AFPTM", "CEA", "CA199", "CHROMGRNA" in the last 8760 hours.  Assessment:  71 y.o. female with peforated diverticulitis s/p multiple abdominal surgeries who developed pelvic abscess post op. Underwent L TG drain placement with Dr. Anselm Pancoast on 04/13/22.    Patient  presents to IR drain clinic today for follow-up.   Imaging obtained today reviewed by Dr. Laurence Ferrari who recommends removal of drain today. Attempted to contact Dr. Johney Maine, unsuccessful.    Drain was removed  without difficulty, patient tolerated well.  Dressing was placed.  Patient was instructed to: - keep the dressing clean and dry for at least 24 hours - no submerging (sitting in tub or swimming) for 7 days - keep the dressing/bandage on to take shower, take dressing/bandage off and pat dry the area completely before placing a new dressing/bandage  - look for signs and symptoms of infection such as reddening of skin, pus like drainage, fever and/or chills - dressing changes as needed until fully heals  - keep appointments with other providers   Patient verbalized understanding.   No further  f/u with IR needed.  Please continue to follow up with you surgeon.    Electronically Signed: Tera Mater PA-C 05/03/2022, 8:34 AM   Please refer to Dr. Katrinka Blazing attestation of this note for  management and plan.   This chart was dictated using voice recognition software.  Despite best efforts to proofread, errors can occur which can change the documentation meaning.

## 2022-05-12 DIAGNOSIS — Z48815 Encounter for surgical aftercare following surgery on the digestive system: Secondary | ICD-10-CM | POA: Diagnosis not present

## 2022-05-12 DIAGNOSIS — N182 Chronic kidney disease, stage 2 (mild): Secondary | ICD-10-CM | POA: Diagnosis not present

## 2022-05-12 DIAGNOSIS — I129 Hypertensive chronic kidney disease with stage 1 through stage 4 chronic kidney disease, or unspecified chronic kidney disease: Secondary | ICD-10-CM | POA: Diagnosis not present

## 2022-05-12 DIAGNOSIS — Z4801 Encounter for change or removal of surgical wound dressing: Secondary | ICD-10-CM | POA: Diagnosis not present

## 2022-05-12 DIAGNOSIS — D631 Anemia in chronic kidney disease: Secondary | ICD-10-CM | POA: Diagnosis not present

## 2022-05-23 ENCOUNTER — Other Ambulatory Visit: Payer: Self-pay | Admitting: Internal Medicine

## 2022-06-13 ENCOUNTER — Ambulatory Visit: Payer: Self-pay | Admitting: Licensed Clinical Social Worker

## 2022-06-13 NOTE — Patient Instructions (Addendum)
Visit Information  Thank you for taking time to visit with me today. Please don't hesitate to contact me if I can be of assistance to you before our next scheduled telephone appointment.  Following are the goals we discussed today:   No further intervention needed  Please call the care guide team at [phone] if you need to cancel or reschedule your appointment.   If you are experiencing a Mental Health or [place] or need someone to talk to, please go to [name] [place] [phone])   Following is a copy of your full plan of care:   Care Coordination Interventions:  Active listening / Reflection utilized  Informed client about Care Coordination program support Client said she felt that she was stable at present and did not feel that she needed program support at this time   Ms. Brooker was given information about Care Management services by the embedded care coordination team including:  Care Management services include personalized support from designated clinical staff supervised by her physician, including individualized plan of care and coordination with other care providers 24/7 contact phone numbers for assistance for urgent and routine care needs. The patient may stop CCM services at any time (effective at the end of the month) by phone call to the office staff.  Patient agreed to services and verbal consent obtained.   Norva Riffle.Tesneem Dufrane MSW, [place] Holiday representative [facility] Care Management [phone]

## 2022-06-13 NOTE — Patient Outreach (Signed)
  Care Coordination   Initial Visit Note   06/13/2022 Name: Vannah Nadal MRN: [id number] DOB: 19-Mar-1951  Veryl Speak Vary is a 71 y.o. year old female who sees Plotnikov, Evie Lacks, MD for primary care. I spoke with  [name] by phone today  What matters to the patients health and wellness today?  Client said she is stable and declined program services at this time    Goals Addressed             This Visit's Progress    Patient Stated she was stable and declined program support at this time       Care Coordination Interventions:   Active listening / Reflection utilized  Informed client about Care Coordination program support Client said she felt that she was stable at present and did not feel that she needed program support at this time         SDOH assessments and interventions completed:  No     Care Coordination Interventions Activated:  No  Care Coordination Interventions:  No, not indicated   Follow up plan: No further intervention required.   Encounter Outcome:  Pt. Visit Completed

## 2022-08-03 ENCOUNTER — Other Ambulatory Visit: Payer: Self-pay | Admitting: Obstetrics & Gynecology

## 2022-08-03 DIAGNOSIS — Z1231 Encounter for screening mammogram for malignant neoplasm of breast: Secondary | ICD-10-CM

## 2022-08-22 ENCOUNTER — Other Ambulatory Visit: Payer: Self-pay | Admitting: Internal Medicine

## 2022-09-12 ENCOUNTER — Ambulatory Visit
Admission: RE | Admit: 2022-09-12 | Discharge: 2022-09-12 | Disposition: A | Discharge status: Home / Self Care | Payer: Medicare Other | Source: Ambulatory Visit | Attending: Obstetrics & Gynecology | Admitting: Obstetrics & Gynecology

## 2022-09-12 DIAGNOSIS — Z1231 Encounter for screening mammogram for malignant neoplasm of breast: Secondary | ICD-10-CM

## 2022-09-20 ENCOUNTER — Ambulatory Visit (INDEPENDENT_AMBULATORY_CARE_PROVIDER_SITE_OTHER): Payer: Medicare Other | Admitting: Internal Medicine

## 2022-09-20 ENCOUNTER — Encounter: Payer: Self-pay | Admitting: Internal Medicine

## 2022-09-20 VITALS — BP 122/76 | HR 70 | Temp 98.9°F | Ht <= 58 in | Wt 186.0 lb

## 2022-09-20 DIAGNOSIS — M5440 Lumbago with sciatica, unspecified side: Secondary | ICD-10-CM | POA: Diagnosis not present

## 2022-09-20 DIAGNOSIS — L910 Hypertrophic scar: Secondary | ICD-10-CM | POA: Insufficient documentation

## 2022-09-20 DIAGNOSIS — E785 Hyperlipidemia, unspecified: Secondary | ICD-10-CM | POA: Diagnosis not present

## 2022-09-20 DIAGNOSIS — K632 Fistula of intestine: Secondary | ICD-10-CM

## 2022-09-20 DIAGNOSIS — R739 Hyperglycemia, unspecified: Secondary | ICD-10-CM | POA: Diagnosis not present

## 2022-09-20 DIAGNOSIS — N182 Chronic kidney disease, stage 2 (mild): Secondary | ICD-10-CM

## 2022-09-20 DIAGNOSIS — G8929 Other chronic pain: Secondary | ICD-10-CM | POA: Diagnosis not present

## 2022-09-20 LAB — CBC WITH DIFFERENTIAL/PLATELET
Basophils Absolute: 0.1 10*3/uL (ref 0.0–0.1)
Basophils Relative: 1.3 % (ref 0.0–3.0)
Eosinophils Absolute: 0.1 10*3/uL (ref 0.0–0.7)
Eosinophils Relative: 1.2 % (ref 0.0–5.0)
HCT: 33.1 % — ABNORMAL LOW (ref 36.0–46.0)
Hemoglobin: 10.5 g/dL — ABNORMAL LOW (ref 12.0–15.0)
Lymphocytes Relative: 28.1 % (ref 12.0–46.0)
Lymphs Abs: 2.8 10*3/uL (ref 0.7–4.0)
MCHC: 31.6 g/dL (ref 30.0–36.0)
MCV: 75.9 fl — ABNORMAL LOW (ref 78.0–100.0)
Monocytes Absolute: 0.9 10*3/uL (ref 0.1–1.0)
Monocytes Relative: 8.7 % (ref 3.0–12.0)
Neutro Abs: 6.1 10*3/uL (ref 1.4–7.7)
Neutrophils Relative %: 60.7 % (ref 43.0–77.0)
Platelets: 425 10*3/uL — ABNORMAL HIGH (ref 150.0–400.0)
RBC: 4.36 Mil/uL (ref 3.87–5.11)
RDW: 21.3 % — ABNORMAL HIGH (ref 11.5–15.5)
WBC: 10 10*3/uL (ref 4.0–10.5)

## 2022-09-20 LAB — COMPREHENSIVE METABOLIC PANEL
ALT: 15 U/L (ref 0–35)
AST: 16 U/L (ref 0–37)
Albumin: 4.1 g/dL (ref 3.5–5.2)
Alkaline Phosphatase: 68 U/L (ref 39–117)
BUN: 19 mg/dL (ref 6–23)
CO2: 27 mEq/L (ref 19–32)
Calcium: 9.6 mg/dL (ref 8.4–10.5)
Chloride: 105 mEq/L (ref 96–112)
Creatinine, Ser: 0.97 mg/dL (ref 0.40–1.20)
GFR: 59.19 mL/min — ABNORMAL LOW (ref >60.00)
Glucose, Bld: 102 mg/dL — ABNORMAL HIGH (ref 70–99)
Potassium: 3.8 mEq/L (ref 3.5–5.1)
Sodium: 140 mEq/L (ref 135–145)
Total Bilirubin: 0.4 mg/dL (ref 0.2–1.2)
Total Protein: 7.4 g/dL (ref 6.0–8.3)

## 2022-09-20 LAB — HEMOGLOBIN A1C: Hgb A1c MFr Bld: 6.5 % (ref 4.6–6.5)

## 2022-09-20 LAB — TSH: TSH: 0.69 u[IU]/mL (ref 0.35–5.50)

## 2022-09-20 NOTE — Assessment & Plan Note (Signed)
Check A1c. 

## 2022-09-20 NOTE — Assessment & Plan Note (Addendum)
Abdomen - keloid scar - see photo Options: cryotherapy or steroid injections Blue-Emu cream was recommended to use 2-3 times a day

## 2022-09-20 NOTE — Patient Instructions (Signed)
Blue-Emu cream -- use 2-3 times a day ? ?

## 2022-09-20 NOTE — Assessment & Plan Note (Signed)
On Lipitor 

## 2022-09-20 NOTE — Assessment & Plan Note (Signed)
Doing well 

## 2022-09-20 NOTE — Progress Notes (Signed)
Subjective:  Patient ID: [name], female    DOB: [birth year]  Age: 71 y.o. MRN: [id number]  CC: Follow-up   HPI [facility] presents for a very itchy scar F/u elev glu, dyslipidemia   Outpatient Medications Prior to Visit  Medication Sig Dispense Refill   atorvastatin (LIPITOR) 10 MG tablet Take 1 tablet (10 mg total) by mouth daily. Annual appt due in Nov must see provider for future refills 90 tablet 1   b complex vitamins tablet Take 1 tablet by mouth daily. 100 tablet 3   CALCIUM PO Take 1 tablet by mouth daily.     Cholecalciferol (VITAMIN D-3) 1000 UNITS CAPS Take 1,000 Units by mouth daily.     Cyanocobalamin ([employer] VITAMIN B12 PO) Take 1 tablet by mouth daily.     hydrochlorothiazide (HYDRODIURIL) 12.5 MG tablet TAKE 1 TABLET(12.5 MG) BY MOUTH DAILY Annual appt due in Nov must see provider for future refills 90 tablet 1   losartan (COZAAR) 100 MG tablet Take 1 tablet (100 mg total) by mouth daily. Annual appt due in Nov must see provider for future refills 90 tablet 1   montelukast (SINGULAIR) 10 MG tablet Take 1 tablet (10 mg total) by mouth daily. Annual appt due in Nov must see provider for future refills 90 tablet 1   Multiple Vitamins-Minerals (CENTRUM ADULTS) TABS Take 1 tablet by mouth daily.     naproxen sodium (ANAPROX) 220 MG tablet Take 220 mg by mouth as needed (headache or pain).     Omega-3 Fatty Acids (FISH OIL PO) Take 1 capsule by mouth daily.     pantoprazole (PROTONIX) 40 MG tablet Take 1 tablet (40 mg total) by mouth daily. Keep scheduled appt for future refills 90 tablet 0   predniSONE (DELTASONE) 50 MG tablet Take by mouth.     triamcinolone ointment (KENALOG) 0.5 % apply to affected area twice a day (Patient taking differently: Apply 1 application  topically once as needed (itching).) 60 g 3   oxyCODONE (OXY IR/ROXICODONE) 5 MG immediate release tablet Take 1 to 2 tablets by mouth every 4  hours as needed for moderate, severe or  breakthrough pain. (Patient not taking: Reported on 09/20/2022) 30 tablet 0   No facility-administered medications prior to visit.    ROS: Review of Systems  Constitutional:  Negative for activity change, appetite change, chills, fatigue and unexpected weight change.  HENT:  Negative for congestion, mouth sores and sinus pressure.   Eyes:  Negative for visual disturbance.  Respiratory:  Negative for cough and chest tightness.   Gastrointestinal:  Negative for abdominal pain and nausea.  Genitourinary:  Negative for difficulty urinating, frequency and vaginal pain.  Musculoskeletal:  Negative for back pain and gait problem.  Skin:  Positive for color change. Negative for pallor and rash.  Neurological:  Negative for dizziness, tremors, weakness, numbness and headaches.  Psychiatric/Behavioral:  Negative for confusion and sleep disturbance. The patient is not nervous/anxious.     Objective:  BP 122/76 (BP Location: Right Arm, Patient Position: Sitting, Cuff Size: Normal)   Pulse 70   Temp 98.9 F (37.2 C) (Oral)   Ht '4\' 10"'$  (1.473 m)   Wt 186 lb (84.4 kg)   SpO2 98%   BMI 38.87 kg/m   BP Readings from Last 3 Encounters:  09/20/22 122/76  04/18/22 134/72  03/22/22 136/89    Wt Readings from Last 3 Encounters:  09/20/22 186 lb (84.4 kg)  04/18/22 169 lb 8.5 oz (  76.9 kg)  03/22/22 176 lb (79.8 kg)    Physical Exam Constitutional:      General: She is not in acute distress.    Appearance: She is well-developed. She is obese.  HENT:     Head: Normocephalic.     Right Ear: External ear normal.     Left Ear: External ear normal.     Nose: Nose normal.  Eyes:     General:        Right eye: No discharge.        Left eye: No discharge.     Conjunctiva/sclera: Conjunctivae normal.     Pupils: Pupils are equal, round, and reactive to light.  Neck:     Thyroid: No thyromegaly.     Vascular: No JVD.     Trachea: No tracheal deviation.  Cardiovascular:     Rate and  Rhythm: Normal rate and regular rhythm.     Heart sounds: Normal heart sounds.  Pulmonary:     Effort: No respiratory distress.     Breath sounds: No stridor. No wheezing.  Abdominal:     General: Bowel sounds are normal. There is no distension.     Palpations: Abdomen is soft. There is no mass.     Tenderness: There is no abdominal tenderness. There is no guarding or rebound.  Musculoskeletal:        General: Tenderness present.     Cervical back: Normal range of motion and neck supple. No rigidity.  Lymphadenopathy:     Cervical: No cervical adenopathy.  Skin:    Findings: No erythema or rash.  Neurological:     Mental Status: She is oriented to person, place, and time.     Cranial Nerves: No cranial nerve deficit.     Motor: No abnormal muscle tone.     Coordination: Coordination normal.     Deep Tendon Reflexes: Reflexes normal.  Psychiatric:        Behavior: Behavior normal.        Thought Content: Thought content normal.        Judgment: Judgment normal.   Large keloid - abd   Lab Results  Component Value Date   WBC 18.4 (H) 04/15/2022   HGB 7.7 (L) 04/15/2022   HCT 24.5 (L) 04/15/2022   PLT 602 (H) 04/15/2022   GLUCOSE 102 (H) 04/17/2022   CHOL 159 03/08/2021   TRIG 143 04/14/2022   HDL 38.40 (L) 03/08/2021   LDLCALC 94 03/08/2021   ALT 34 04/14/2022   AST 17 04/14/2022   NA 138 04/17/2022   K 4.3 04/17/2022   CL 104 04/17/2022   CREATININE 0.99 04/17/2022   BUN 18 04/17/2022   CO2 26 04/17/2022   TSH 0.55 09/08/2021   INR 1.2 04/13/2022   HGBA1C 6.5 03/08/2021    MM 3D SCREEN BREAST BILATERAL  Result Date: 09/14/2022 CLINICAL DATA:  Screening. EXAM: DIGITAL SCREENING BILATERAL MAMMOGRAM WITH TOMOSYNTHESIS AND CAD TECHNIQUE: Bilateral screening digital craniocaudal and mediolateral oblique mammograms were obtained. Bilateral screening digital breast tomosynthesis was performed. The images were evaluated with computer-aided detection. COMPARISON:   Previous exam(s). ACR Breast Density Category b: There are scattered areas of fibroglandular density. FINDINGS: There are no findings suspicious for malignancy. IMPRESSION: No mammographic evidence of malignancy. A result letter of this screening mammogram will be mailed directly to the patient. RECOMMENDATION: Screening mammogram in one year. (Code:SM-B-01Y) BI-RADS CATEGORY  1: Negative. Electronically Signed   By: Nolon Nations M.D.   On:  09/14/2022 10:48    Assessment & Plan:   Problem List Items Addressed This Visit     CKD (chronic kidney disease) stage 2, GFR 60-89 ml/min    Monitor GFR      Relevant Orders   CBC with Differential/Platelet   Hemoglobin A1c   TSH   Comprehensive metabolic panel   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    Doing well      Dyslipidemia    On Lipitor      Relevant Orders   CBC with Differential/Platelet   Hemoglobin A1c   TSH   Comprehensive metabolic panel   Hyperglycemia    Check A1c      Relevant Orders   CBC with Differential/Platelet   Hemoglobin A1c   TSH   Comprehensive metabolic panel   Keloid scar - Primary    Abdomen - keloid scar - see photo Options: cryotherapy or steroid injections Blue-Emu cream was recommended to use 2-3 times a day       LOW BACK PAIN    Occ LBP Blue-Emu cream was recommended to use 2-3 times a day        Relevant Medications   predniSONE (DELTASONE) 50 MG tablet      No orders of the defined types were placed in this encounter.     Follow-up: Return in about 3 months (around 12/21/2022) for a follow-up visit.  Walker Kehr, MD

## 2022-09-20 NOTE — Assessment & Plan Note (Signed)
Occ LBP Blue-Emu cream was recommended to use 2-3 times a day

## 2022-09-20 NOTE — Assessment & Plan Note (Signed)
Monitor GFR 

## 2022-09-27 ENCOUNTER — Other Ambulatory Visit (HOSPITAL_COMMUNITY)
Admission: RE | Admit: 2022-09-27 | Discharge: 2022-09-27 | Disposition: A | Discharge status: Home / Self Care | Payer: Medicare Other | Source: Ambulatory Visit | Attending: Obstetrics & Gynecology | Admitting: Obstetrics & Gynecology

## 2022-09-27 ENCOUNTER — Ambulatory Visit (INDEPENDENT_AMBULATORY_CARE_PROVIDER_SITE_OTHER): Payer: Medicare Other | Admitting: Obstetrics & Gynecology

## 2022-09-27 ENCOUNTER — Encounter: Payer: Self-pay | Admitting: Obstetrics & Gynecology

## 2022-09-27 VITALS — BP 118/72 | HR 103 | Resp 16 | Ht 59.25 in | Wt 181.0 lb

## 2022-09-27 DIAGNOSIS — N8185 Cervical stump prolapse: Secondary | ICD-10-CM

## 2022-09-27 DIAGNOSIS — Z01419 Encounter for gynecological examination (general) (routine) without abnormal findings: Secondary | ICD-10-CM | POA: Diagnosis present

## 2022-09-27 DIAGNOSIS — Z780 Asymptomatic menopausal state: Secondary | ICD-10-CM | POA: Diagnosis not present

## 2022-09-27 DIAGNOSIS — Z124 Encounter for screening for malignant neoplasm of cervix: Secondary | ICD-10-CM

## 2022-09-27 DIAGNOSIS — Z9189 Other specified personal risk factors, not elsewhere classified: Secondary | ICD-10-CM

## 2022-09-27 DIAGNOSIS — M8580 Other specified disorders of bone density and structure, unspecified site: Secondary | ICD-10-CM | POA: Diagnosis not present

## 2022-09-27 NOTE — Progress Notes (Signed)
Dongola 08-Aug-1951 [id number]   History:    71 y.o. G33P2A1L2 Married  RP:  Established patient presenting for annual gyn exam    HPI: Cervical stump prolapse. Prior supracervical hysterectomy. Using ring pessary with good results. Has been able to remove and cleanse device herself without difficulty. Will continue with use. Postmenopause, well on no HRT.  No PMB.  No pelvic pain. Pap smear Neg 09/2017. No significant history of abnormal Pap smears.  Pap reflex done today. Breasts normal.  Mammogram 08/2022 Neg.  Colonoscopy 2018 Neg. Osteopenia. DEXA 10/2018. T score -1.8 at [facility], FRAX 3.4% / 0.3%, stable from prior DEXA, will schedule Dexa here now.  Encourage weightbearing exercise, continue with frequent walking and continued calcium and vitamin D supplement.  BMI 36.25. Health labs with Fam MD.   Past medical history,surgical history, family history and social history were all reviewed and documented in the EPIC chart.  Gynecologic History No LMP recorded. Patient has had a hysterectomy.  Obstetric History OB History  Gravida Para Term Preterm AB Living  '3 2 2   1 1  '$ SAB IAB Ectopic Multiple Live Births  1       2    # Outcome Date GA Lbr Len/2nd Weight Sex Delivery Anes PTL Lv  3 Term           2 SAB           1 Term              ROS: A ROS was performed and pertinent positives and negatives are included in the history. GENERAL: No fevers or chills. HEENT: No change in vision, no earache, sore throat or sinus congestion. NECK: No pain or stiffness. CARDIOVASCULAR: No chest pain or pressure. No palpitations. PULMONARY: No shortness of breath, cough or wheeze. GASTROINTESTINAL: No abdominal pain, nausea, vomiting or diarrhea, melena or bright red blood per rectum. GENITOURINARY: No urinary frequency, urgency, hesitancy or dysuria. MUSCULOSKELETAL: No joint or muscle pain, no back pain, no recent trauma. DERMATOLOGIC: No rash, no itching, no lesions. ENDOCRINE: No  polyuria, polydipsia, no heat or cold intolerance. No recent change in weight. HEMATOLOGICAL: No anemia or easy bruising or bleeding. NEUROLOGIC: No headache, seizures, numbness, tingling or weakness. PSYCHIATRIC: No depression, no loss of interest in normal activity or change in sleep pattern.     Exam:   BP 118/72   Pulse (!) 103   Resp 16   Ht 4' 11.25" (1.505 m)   Wt 181 lb (82.1 kg)   SpO2 98%   BMI 36.25 kg/m   Body mass index is 36.25 kg/m.  General appearance : Well developed well nourished female. No acute distress HEENT: Eyes: no retinal hemorrhage or exudates,  Neck supple, trachea midline, no carotid bruits, no thyroidmegaly Lungs: Clear to auscultation, no rhonchi or wheezes, or rib retractions  Heart: Regular rate and rhythm, no murmurs or gallops Breast:Examined in sitting and supine position were symmetrical in appearance, no palpable masses or tenderness,  no skin retraction, no nipple inversion, no nipple discharge, no skin discoloration, no axillary or supraclavicular lymphadenopathy Abdomen: no palpable masses or tenderness, no rebound or guarding Extremities: no edema or skin discoloration or tenderness  Pelvic: Vulva: Normal             Vagina: No gross lesions or discharge  Cervix: No gross lesions or discharge.  Pap reflex done.  Uterus  Absent  Adnexa  Without masses or tenderness  Anus: Normal  Assessment/Plan:  71 y.o. female for annual exam   1. Encounter for routine gynecological examination with Papanicolaou smear of cervix Cervical stump prolapse. Prior supracervical hysterectomy. Using ring pessary with good results. Has been able to remove and cleanse device herself without difficulty. Will continue with use.  Postmenopause, well on no HRT.  No PMB.  No pelvic pain. Pap smear Neg 09/2017. No significant history of abnormal Pap smears.  Pap reflex done today. Breasts normal.  Mammogram 08/2022 Neg.  Colonoscopy 2018 Neg. Osteopenia. DEXA 10/2018.  T score -1.8 at [facility], FRAX 3.4% / 0.3%, stable from prior DEXA, will schedule Dexa here now.  Encourage weightbearing exercise, continue with frequent walking and continued calcium and vitamin D supplement.  BMI 36.25. Health labs with Fam MD.  - Cytology - PAP( [place])  2. Cervical stump prolapse Will continue to use the pessary as needed.  3. Postmenopause Postmenopause, well on no HRT.  No PMB.  No pelvic pain.  4. Osteopenia, unspecified location Osteopenia. DEXA 10/2018. T score -1.8 at [facility], FRAX 3.4% / 0.3%, stable from prior DEXA, will schedule Dexa here now.  Encourage weightbearing exercise, continue with frequent walking and continued calcium and vitamin D supplement.  - DG Bone Density; Future  5. Other specified personal risk factors, not elsewhere classified   Princess Bruins MD, 2:58 PM

## 2022-09-30 LAB — CYTOLOGY - PAP
Diagnosis: NEGATIVE
Diagnosis: REACTIVE

## 2022-10-05 ENCOUNTER — Ambulatory Visit (INDEPENDENT_AMBULATORY_CARE_PROVIDER_SITE_OTHER): Payer: Medicare Other

## 2022-10-05 ENCOUNTER — Other Ambulatory Visit: Payer: Self-pay | Admitting: Obstetrics & Gynecology

## 2022-10-05 DIAGNOSIS — Z780 Asymptomatic menopausal state: Secondary | ICD-10-CM | POA: Diagnosis not present

## 2022-10-05 DIAGNOSIS — M8589 Other specified disorders of bone density and structure, multiple sites: Secondary | ICD-10-CM

## 2022-10-05 DIAGNOSIS — Z01419 Encounter for gynecological examination (general) (routine) without abnormal findings: Secondary | ICD-10-CM

## 2022-10-05 DIAGNOSIS — Z13820 Encounter for screening for osteoporosis: Secondary | ICD-10-CM | POA: Diagnosis not present

## 2022-10-05 DIAGNOSIS — N8185 Cervical stump prolapse: Secondary | ICD-10-CM

## 2022-10-05 DIAGNOSIS — Z9189 Other specified personal risk factors, not elsewhere classified: Secondary | ICD-10-CM

## 2022-10-05 DIAGNOSIS — M8580 Other specified disorders of bone density and structure, unspecified site: Secondary | ICD-10-CM

## 2022-11-14 ENCOUNTER — Other Ambulatory Visit: Payer: Self-pay | Admitting: Internal Medicine

## 2022-12-21 ENCOUNTER — Encounter: Payer: Self-pay | Admitting: Internal Medicine

## 2022-12-21 ENCOUNTER — Ambulatory Visit (INDEPENDENT_AMBULATORY_CARE_PROVIDER_SITE_OTHER): Payer: Medicare Other | Admitting: Internal Medicine

## 2022-12-21 VITALS — BP 122/62 | HR 93 | Temp 98.9°F | Ht 59.25 in | Wt 182.0 lb

## 2022-12-21 DIAGNOSIS — K632 Fistula of intestine: Secondary | ICD-10-CM

## 2022-12-21 DIAGNOSIS — N182 Chronic kidney disease, stage 2 (mild): Secondary | ICD-10-CM

## 2022-12-21 DIAGNOSIS — R739 Hyperglycemia, unspecified: Secondary | ICD-10-CM

## 2022-12-21 DIAGNOSIS — L910 Hypertrophic scar: Secondary | ICD-10-CM | POA: Diagnosis not present

## 2022-12-21 NOTE — Assessment & Plan Note (Signed)
Try to loose wt 

## 2022-12-21 NOTE — Assessment & Plan Note (Addendum)
Keloid scars - abd - see photo in my 09/20/22 note  Blue-Emu cream use 2-3 times a day and Benadryl cream on the scar We can inject along the edges in 3 wks

## 2022-12-21 NOTE — Assessment & Plan Note (Signed)
Monitor GFR 

## 2022-12-21 NOTE — Patient Instructions (Signed)
Blue-Emu cream use 2-3 times a day and Benadryl cream on the scar We can inject along the edges in 3 wks

## 2022-12-21 NOTE — Progress Notes (Signed)
Subjective:  Patient ID: Regina Roberts, female    DOB: 08/19/1951  Age: 72 y.o. MRN: [id number]  CC: Follow-up (Would like for you to take a look at her scar )   HPI Regina Roberts presents for itchy scar  Outpatient Medications Prior to Visit  Medication Sig Dispense Refill   atorvastatin (LIPITOR) 10 MG tablet TAKE 1 TABLET BY MOUTH DAILY 90 tablet 1   b complex vitamins tablet Take 1 tablet by mouth daily. 100 tablet 3   CALCIUM PO Take 1 tablet by mouth daily.     Cholecalciferol (VITAMIN D-3) 1000 UNITS CAPS Take 1,000 Units by mouth daily.     Cyanocobalamin ([employer]S VITAMIN B12 PO) Take 1 tablet by mouth daily.     hydrochlorothiazide (HYDRODIURIL) 12.5 MG tablet TAKE 1 TABLET BY MOUTH DAILY 90 tablet 1   losartan (COZAAR) 100 MG tablet TAKE 1 TABLET BY MOUTH DAILY 90 tablet 1   montelukast (SINGULAIR) 10 MG tablet TAKE 1 TABLET BY MOUTH EVERY DAY 90 tablet 1   Multiple Vitamins-Minerals (CENTRUM ADULTS) TABS Take 1 tablet by mouth daily.     naproxen sodium (ANAPROX) 220 MG tablet Take 220 mg by mouth as needed (headache or pain).     Omega-3 Fatty Acids (FISH OIL PO) Take 1 capsule by mouth daily.     pantoprazole (PROTONIX) 40 MG tablet TAKE 1 TABLET(40 MG) BY MOUTH DAILY 90 tablet 0   triamcinolone ointment (KENALOG) 0.5 % apply to affected area twice a day (Patient taking differently: Apply 1 application  topically once as needed (itching).) 60 g 3   No facility-administered medications prior to visit.    ROS: Review of Systems  Constitutional:  Negative for activity change, appetite change, chills, fatigue and unexpected weight change.  HENT:  Negative for congestion, mouth sores and sinus pressure.   Eyes:  Negative for visual disturbance.  Respiratory:  Negative for cough and chest tightness.   Gastrointestinal:  Positive for abdominal distention. Negative for abdominal pain and nausea.  Genitourinary:  Negative for difficulty urinating, frequency and  vaginal pain.  Musculoskeletal:  Negative for back pain and gait problem.  Skin:  Positive for color change. Negative for pallor and rash.  Neurological:  Negative for dizziness, tremors, weakness, numbness and headaches.  Hematological:  Does not bruise/bleed easily.  Psychiatric/Behavioral:  Negative for confusion and sleep disturbance.     Objective:  BP 122/62 (BP Location: Left Arm, Patient Position: Sitting, Cuff Size: Normal)   Pulse 93   Temp 98.9 F (37.2 C) (Oral)   Ht 4' 11.25" (1.505 m)   Wt 182 lb (82.6 kg)   SpO2 99%   BMI 36.45 kg/m   BP Readings from Last 3 Encounters:  12/21/22 122/62  09/27/22 118/72  09/20/22 122/76    Wt Readings from Last 3 Encounters:  12/21/22 182 lb (82.6 kg)  09/27/22 181 lb (82.1 kg)  09/20/22 186 lb (84.4 kg)    Physical Exam Constitutional:      General: She is not in acute distress.    Appearance: She is well-developed. She is obese.  HENT:     Head: Normocephalic.     Right Ear: External ear normal.     Left Ear: External ear normal.     Nose: Nose normal.  Eyes:     General:        Right eye: No discharge.        Left eye: No discharge.  Conjunctiva/sclera: Conjunctivae normal.     Pupils: Pupils are equal, round, and reactive to light.  Neck:     Thyroid: No thyromegaly.     Vascular: No JVD.     Trachea: No tracheal deviation.  Cardiovascular:     Rate and Rhythm: Normal rate and regular rhythm.     Heart sounds: Normal heart sounds.  Pulmonary:     Effort: No respiratory distress.     Breath sounds: No stridor. No wheezing.  Abdominal:     General: Bowel sounds are normal. There is no distension.     Palpations: Abdomen is soft. There is no mass.     Tenderness: There is no abdominal tenderness. There is no guarding or rebound.  Musculoskeletal:        General: No tenderness.     Cervical back: Normal range of motion and neck supple. No rigidity.  Lymphadenopathy:     Cervical: No cervical  adenopathy.  Skin:    Findings: Lesion present. No erythema or rash.  Neurological:     Mental Status: She is oriented to person, place, and time.     Cranial Nerves: No cranial nerve deficit.     Motor: No abnormal muscle tone.     Coordination: Coordination normal.     Deep Tendon Reflexes: Reflexes normal.  Psychiatric:        Behavior: Behavior normal.        Thought Content: Thought content normal.        Judgment: Judgment normal.    Abd assymetry L>R discussed (will watch) Keloid scars - abd - see photo in my 09/20/22 note   Lab Results  Component Value Date   WBC 10.0 09/20/2022   HGB 10.5 (L) 09/20/2022   HCT 33.1 (L) 09/20/2022   PLT 425.0 (H) 09/20/2022   GLUCOSE 102 (H) 09/20/2022   CHOL 159 03/08/2021   TRIG 143 04/14/2022   HDL 38.40 (L) 03/08/2021   LDLCALC 94 03/08/2021   ALT 15 09/20/2022   AST 16 09/20/2022   NA 140 09/20/2022   K 3.8 09/20/2022   CL 105 09/20/2022   CREATININE 0.97 09/20/2022   BUN 19 09/20/2022   CO2 27 09/20/2022   TSH 0.69 09/20/2022   INR 1.2 04/13/2022   HGBA1C 6.5 09/20/2022    No results found.  Assessment & Plan:   Problem List Items Addressed This Visit       Digestive   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    Abd assymetry L>R discussed (will watch)        Musculoskeletal and Integument   Keloid scar - Primary    Keloid scars - abd - see photo in my 09/20/22 note  Blue-Emu cream use 2-3 times a day and Benadryl cream on the scar We can inject along the edges in 3 wks         Genitourinary   CKD (chronic kidney disease) stage 2, GFR 60-89 ml/min    Monitor GFR        Other   Hyperglycemia    Try to loose wt         No orders of the defined types were placed in this encounter.     Follow-up: Return in about 3 months (around 03/21/2023) for a follow-up visit.  Walker Kehr, MD

## 2022-12-21 NOTE — Assessment & Plan Note (Signed)
Abd assymetry L>R discussed (will watch)

## 2023-02-06 DIAGNOSIS — H25813 Combined forms of age-related cataract, bilateral: Secondary | ICD-10-CM | POA: Diagnosis not present

## 2023-02-06 DIAGNOSIS — H524 Presbyopia: Secondary | ICD-10-CM | POA: Diagnosis not present

## 2023-02-13 ENCOUNTER — Other Ambulatory Visit: Payer: Self-pay | Admitting: Internal Medicine

## 2023-03-28 ENCOUNTER — Ambulatory Visit (INDEPENDENT_AMBULATORY_CARE_PROVIDER_SITE_OTHER): Payer: Medicare Other | Admitting: Internal Medicine

## 2023-03-28 ENCOUNTER — Encounter: Payer: Self-pay | Admitting: Internal Medicine

## 2023-03-28 VITALS — BP 110/70 | HR 85 | Temp 98.2°F | Ht 59.5 in | Wt 181.0 lb

## 2023-03-28 DIAGNOSIS — L910 Hypertrophic scar: Secondary | ICD-10-CM | POA: Diagnosis not present

## 2023-03-28 DIAGNOSIS — N182 Chronic kidney disease, stage 2 (mild): Secondary | ICD-10-CM | POA: Diagnosis not present

## 2023-03-28 DIAGNOSIS — I10 Essential (primary) hypertension: Secondary | ICD-10-CM

## 2023-03-28 DIAGNOSIS — R944 Abnormal results of kidney function studies: Secondary | ICD-10-CM

## 2023-03-28 DIAGNOSIS — K219 Gastro-esophageal reflux disease without esophagitis: Secondary | ICD-10-CM | POA: Diagnosis not present

## 2023-03-28 DIAGNOSIS — R739 Hyperglycemia, unspecified: Secondary | ICD-10-CM | POA: Diagnosis not present

## 2023-03-28 LAB — CBC WITH DIFFERENTIAL/PLATELET
Basophils Absolute: 0.1 10*3/uL (ref 0.0–0.1)
Basophils Relative: 0.4 % (ref 0.0–3.0)
Eosinophils Absolute: 0.2 10*3/uL (ref 0.0–0.7)
Eosinophils Relative: 1.3 % (ref 0.0–5.0)
HCT: 35 % — ABNORMAL LOW (ref 36.0–46.0)
Hemoglobin: 11 g/dL — ABNORMAL LOW (ref 12.0–15.0)
Lymphocytes Relative: 25.8 % (ref 12.0–46.0)
Lymphs Abs: 3.1 10*3/uL (ref 0.7–4.0)
MCHC: 31.5 g/dL (ref 30.0–36.0)
MCV: 80.2 fl (ref 78.0–100.0)
Monocytes Absolute: 1.3 10*3/uL — ABNORMAL HIGH (ref 0.1–1.0)
Monocytes Relative: 10.9 % (ref 3.0–12.0)
Neutro Abs: 7.5 10*3/uL (ref 1.4–7.7)
Neutrophils Relative %: 61.6 % (ref 43.0–77.0)
Platelets: 414 10*3/uL — ABNORMAL HIGH (ref 150.0–400.0)
RBC: 4.37 Mil/uL (ref 3.87–5.11)
RDW: 18 % — ABNORMAL HIGH (ref 11.5–15.5)
WBC: 12.2 10*3/uL — ABNORMAL HIGH (ref 4.0–10.5)

## 2023-03-28 LAB — COMPREHENSIVE METABOLIC PANEL
ALT: 12 U/L (ref 0–35)
AST: 11 U/L (ref 0–37)
Albumin: 4.2 g/dL (ref 3.5–5.2)
Alkaline Phosphatase: 68 U/L (ref 39–117)
BUN: 18 mg/dL (ref 6–23)
CO2: 30 mEq/L (ref 19–32)
Calcium: 9.8 mg/dL (ref 8.4–10.5)
Chloride: 104 mEq/L (ref 96–112)
Creatinine, Ser: 0.95 mg/dL (ref 0.40–1.20)
GFR: 60.47 mL/min (ref >60.00)
Glucose, Bld: 97 mg/dL (ref 70–99)
Potassium: 4.2 mEq/L (ref 3.5–5.1)
Sodium: 141 mEq/L (ref 135–145)
Total Bilirubin: 0.4 mg/dL (ref 0.2–1.2)
Total Protein: 7.6 g/dL (ref 6.0–8.3)

## 2023-03-28 NOTE — Progress Notes (Signed)
Subjective:  Patient ID: Regina Roberts, female    DOB: 1951-04-07  Age: 72 y.o. MRN: [id number]  CC: Follow-up (3 mnth f/u)   HPI [employer] presents for keloid - better F/u on HTN, GERD  Outpatient Medications Prior to Visit  Medication Sig Dispense Refill   atorvastatin (LIPITOR) 10 MG tablet TAKE 1 TABLET BY MOUTH DAILY 90 tablet 1   b complex vitamins tablet Take 1 tablet by mouth daily. 100 tablet 3   CALCIUM PO Take 1 tablet by mouth daily.     Cholecalciferol (VITAMIN D-3) 1000 UNITS CAPS Take 1,000 Units by mouth daily.     Cyanocobalamin ([employer] VITAMIN B12 PO) Take 1 tablet by mouth daily.     hydrochlorothiazide (HYDRODIURIL) 12.5 MG tablet TAKE 1 TABLET BY MOUTH DAILY 90 tablet 1   losartan (COZAAR) 100 MG tablet TAKE 1 TABLET BY MOUTH DAILY 90 tablet 1   montelukast (SINGULAIR) 10 MG tablet TAKE 1 TABLET BY MOUTH EVERY DAY 90 tablet 1   Multiple Vitamins-Minerals (CENTRUM ADULTS) TABS Take 1 tablet by mouth daily.     naproxen sodium (ANAPROX) 220 MG tablet Take 220 mg by mouth as needed (headache or pain).     Omega-3 Fatty Acids (FISH OIL PO) Take 1 capsule by mouth daily.     pantoprazole (PROTONIX) 40 MG tablet TAKE 1 TABLET(40 MG) BY MOUTH DAILY 90 tablet 2   triamcinolone ointment (KENALOG) 0.5 % apply to affected area twice a day (Patient taking differently: Apply 1 application  topically once as needed (itching).) 60 g 3   No facility-administered medications prior to visit.    ROS: Review of Systems  Constitutional:  Negative for activity change, appetite change, chills, fatigue and unexpected weight change.  HENT:  Negative for congestion, mouth sores and sinus pressure.   Eyes:  Negative for visual disturbance.  Respiratory:  Negative for cough and chest tightness.   Gastrointestinal:  Negative for abdominal pain and nausea.  Genitourinary:  Negative for difficulty urinating, frequency and vaginal pain.  Musculoskeletal:  Negative for  back pain and gait problem.  Skin:  Negative for pallor, rash and wound.  Neurological:  Negative for dizziness, tremors, weakness, numbness and headaches.  Psychiatric/Behavioral:  Positive for dysphoric mood. Negative for confusion, sleep disturbance and suicidal ideas. The patient is nervous/anxious.     Objective:  BP 110/70 (BP Location: Left Arm, Patient Position: Sitting, Cuff Size: Normal)   Pulse 85   Temp 98.2 F (36.8 C) (Oral)   Ht 4' 11.5" (1.511 m)   Wt 181 lb (82.1 kg)   SpO2 93%   BMI 35.95 kg/m   BP Readings from Last 3 Encounters:  03/28/23 110/70  12/21/22 122/62  09/27/22 118/72    Wt Readings from Last 3 Encounters:  03/28/23 181 lb (82.1 kg)  12/21/22 182 lb (82.6 kg)  09/27/22 181 lb (82.1 kg)    Physical Exam Constitutional:      General: She is not in acute distress.    Appearance: She is well-developed. She is obese.  HENT:     Head: Normocephalic.     Right Ear: External ear normal.     Left Ear: External ear normal.     Nose: Nose normal.  Eyes:     General:        Right eye: No discharge.        Left eye: No discharge.     Conjunctiva/sclera: Conjunctivae normal.     Pupils:  Pupils are equal, round, and reactive to light.  Neck:     Thyroid: No thyromegaly.     Vascular: No JVD.     Trachea: No tracheal deviation.  Cardiovascular:     Rate and Rhythm: Normal rate and regular rhythm.     Heart sounds: Normal heart sounds.  Pulmonary:     Effort: No respiratory distress.     Breath sounds: No stridor. No wheezing.  Abdominal:     General: Bowel sounds are normal. There is no distension.     Palpations: Abdomen is soft. There is no mass.     Tenderness: There is no abdominal tenderness. There is no guarding or rebound.  Musculoskeletal:        General: No tenderness.     Cervical back: Normal range of motion and neck supple. No rigidity.  Lymphadenopathy:     Cervical: No cervical adenopathy.  Skin:    Findings: Lesion present.  No erythema or rash.  Neurological:     Cranial Nerves: No cranial nerve deficit.     Motor: No abnormal muscle tone.     Coordination: Coordination normal.     Deep Tendon Reflexes: Reflexes normal.  Psychiatric:        Behavior: Behavior normal.        Thought Content: Thought content normal.        Judgment: Judgment normal.   Keloid is flatter  Lab Results  Component Value Date   WBC 10.0 09/20/2022   HGB 10.5 (L) 09/20/2022   HCT 33.1 (L) 09/20/2022   PLT 425.0 (H) 09/20/2022   GLUCOSE 102 (H) 09/20/2022   CHOL 159 03/08/2021   TRIG 143 04/14/2022   HDL 38.40 (L) 03/08/2021   LDLCALC 94 03/08/2021   ALT 15 09/20/2022   AST 16 09/20/2022   NA 140 09/20/2022   K 3.8 09/20/2022   CL 105 09/20/2022   CREATININE 0.97 09/20/2022   BUN 19 09/20/2022   CO2 27 09/20/2022   TSH 0.69 09/20/2022   INR 1.2 04/13/2022   HGBA1C 6.5 09/20/2022    No results found.  Assessment & Plan:   Problem List Items Addressed This Visit     Essential hypertension - Primary    On Losartan and HCTZ Labs pre-op -- planned      Relevant Orders   CBC with Differential/Platelet   Iron, TIBC and Ferritin Panel   Comprehensive metabolic panel   Hemoglobin A1c   GERD (gastroesophageal reflux disease)    Cont on Protonix      Hyperglycemia    Try to loose wt Wt Readings from Last 3 Encounters:  03/28/23 181 lb (82.1 kg)  12/21/22 182 lb (82.6 kg)  09/27/22 181 lb (82.1 kg)  Monitor A1c       Relevant Orders   Hemoglobin A1c   Decreased GFR    Hydrate well Monitor GFR      CKD (chronic kidney disease) stage 2, GFR 60-89 ml/min    Hydrate well Monitor GFR      Relevant Orders   CBC with Differential/Platelet   Iron, TIBC and Ferritin Panel   Comprehensive metabolic panel   Hemoglobin A1c   Keloid scar    Better on Blue-Emu cream          No orders of the defined types were placed in this encounter.     Follow-up: No follow-ups on file.  Sonda Primes,  MD

## 2023-03-28 NOTE — Assessment & Plan Note (Signed)
Better on Blue-Emu cream 

## 2023-03-28 NOTE — Assessment & Plan Note (Signed)
Cont on Protonix ?

## 2023-03-28 NOTE — Assessment & Plan Note (Signed)
Hydrate well ?Monitor GFR ?

## 2023-03-28 NOTE — Assessment & Plan Note (Signed)
On Losartan and HCTZ Labs pre-op -- planned

## 2023-03-28 NOTE — Assessment & Plan Note (Signed)
Try to loose wt Wt Readings from Last 3 Encounters:  03/28/23 181 lb (82.1 kg)  12/21/22 182 lb (82.6 kg)  09/27/22 181 lb (82.1 kg)  Monitor A1c

## 2023-03-29 LAB — IRON,TIBC AND FERRITIN PANEL
%SAT: 12 % (calc) — ABNORMAL LOW (ref 16–45)
Ferritin: 7 ng/mL — ABNORMAL LOW (ref 16–288)
Iron: 51 ug/dL (ref 45–160)
TIBC: 427 mcg/dL (calc) (ref 250–450)

## 2023-03-29 LAB — HEMOGLOBIN A1C: Hgb A1c MFr Bld: 6.4 % (ref 4.6–6.5)

## 2023-05-14 ENCOUNTER — Other Ambulatory Visit: Payer: Self-pay | Admitting: Internal Medicine

## 2023-06-14 IMAGING — US US BREAST*L* LIMITED INC AXILLA
1 series · 4 of 4 positions shown · non-contrast
Comparison: Previous exam(s).

CLINICAL DATA: Patient returns after screening study for evaluation
of possible LEFT breast asymmetry.

EXAM:
DIGITAL DIAGNOSTIC UNILATERAL LEFT MAMMOGRAM WITH TOMOSYNTHESIS AND
CAD; ULTRASOUND LEFT BREAST LIMITED
TECHNIQUE: Left digital diagnostic mammography and breast tomosynthesis was
performed. The images were evaluated with computer-aided detection.;
Targeted ultrasound examination of the left breast was performed.

[Series 1: us breast*left* limited inc axilla · 0.07mm/px · 4 of 4 slices shown]
[im 1/4]
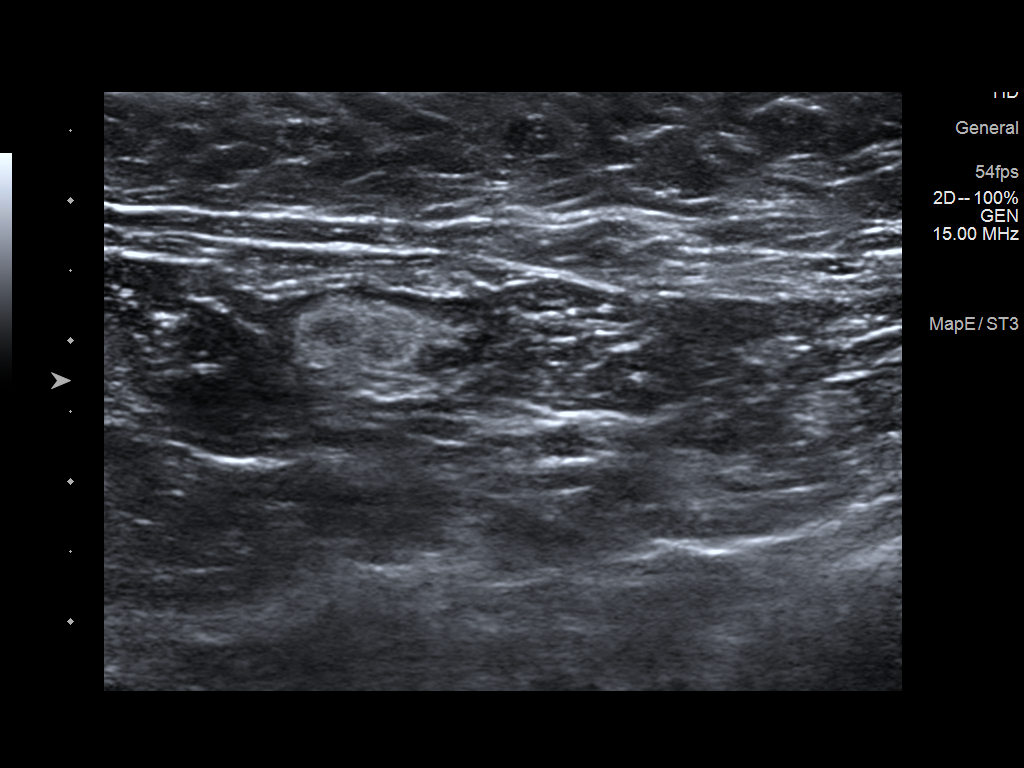
[im 2/4]
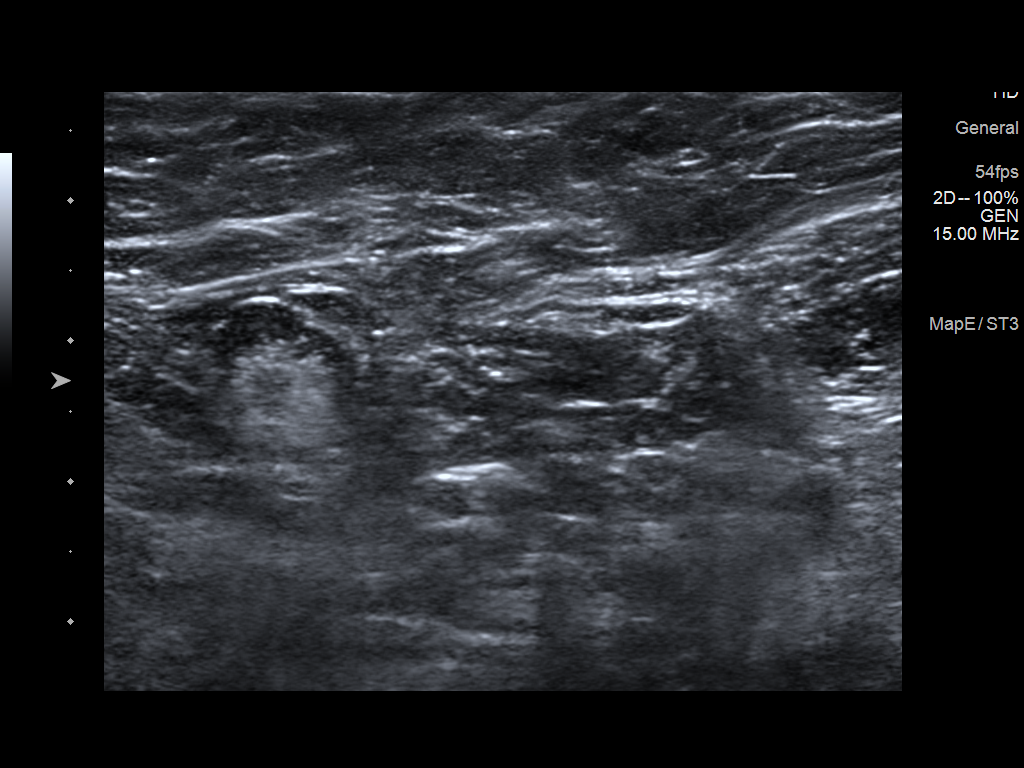
[im 3/4]
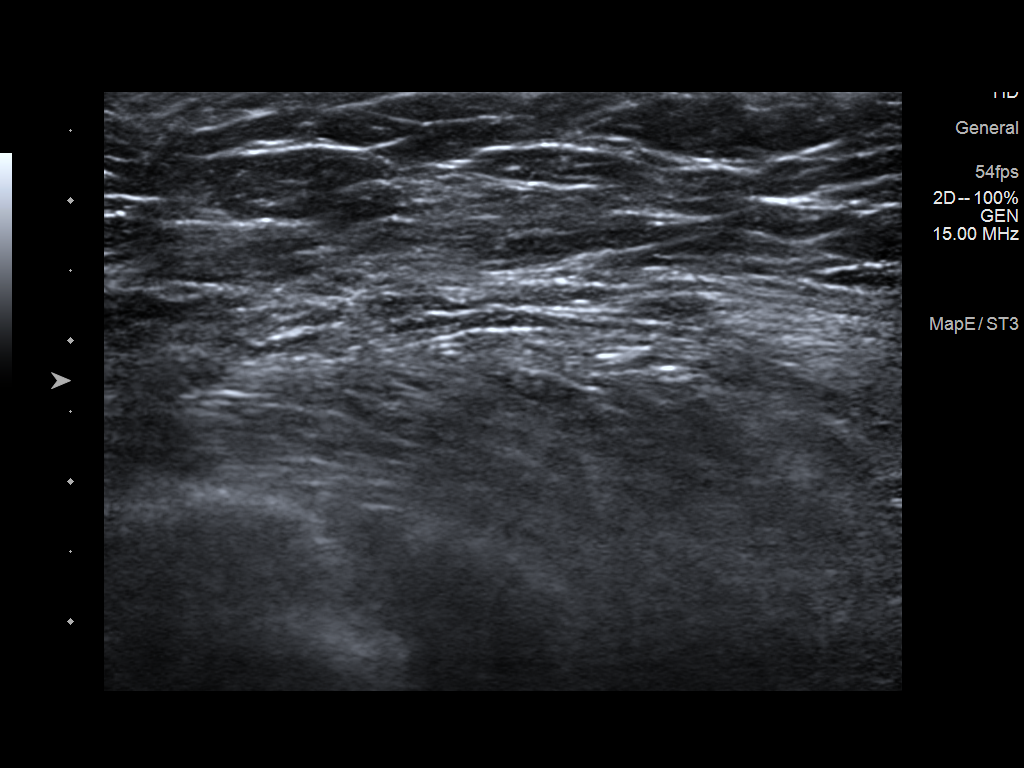
[im 4/4]
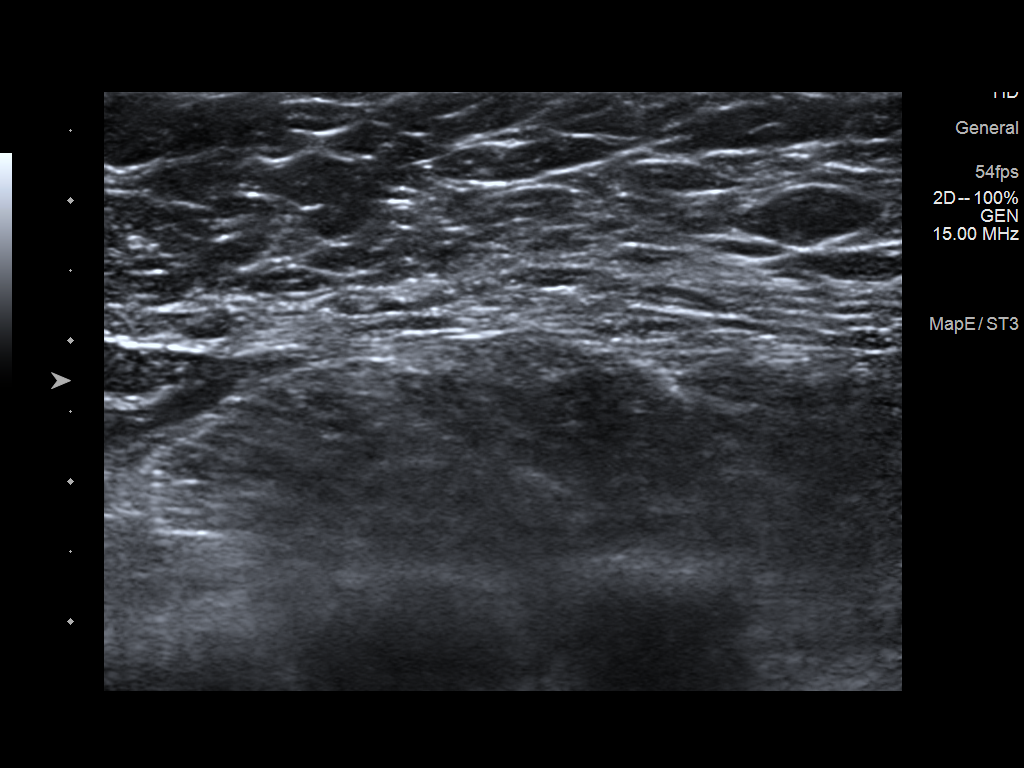

[4 of 4 positions shown; findings below may reference images not displayed]

ACR Breast Density Category c: The breast tissue is heterogeneously
dense, which may obscure small masses.
FINDINGS: Additional 2-D and 3-D images are performed. These views show
asymmetry in the UPPER-OUTER QUADRANT of the LEFT breast, similar in
appearance to multiple prior studies. There is no associated mass or
distortion in this region.

On physical exam, I palpate no discrete abnormality in the LATERAL
aspect of the LEFT breast.

Targeted ultrasound is performed, showing normal appearing
fibroglandular tissue throughout the LATERAL portions the LEFT
breast. Lymph nodes with normal morphology are seen in the LOWER
LEFT axilla. No suspicious mass, distortion, or acoustic shadowing
is demonstrated with ultrasound.
IMPRESSION: No mammographic or ultrasound evidence for malignancy. Asymmetrical
fibroglandular tissue in the UPPER OUTER QUADRANT of the LEFT
breast, showing long-term stability.

RECOMMENDATION:
Screening mammogram in one year.(Code:TO-C-QQV)

I have discussed the findings and recommendations with the patient.
If applicable, a reminder letter will be sent to the patient
regarding the next appointment.

BI-RADS CATEGORY  1: Negative.

## 2023-06-14 IMAGING — MG MM DIGITAL DIAGNOSTIC UNILAT*L* W/ TOMO W/ CAD
4 series · 4 of 12 positions shown · non-contrast
Comparison: Previous exam(s).

CLINICAL DATA: Patient returns after screening study for evaluation
of possible LEFT breast asymmetry.

EXAM:
DIGITAL DIAGNOSTIC UNILATERAL LEFT MAMMOGRAM WITH TOMOSYNTHESIS AND
CAD; ULTRASOUND LEFT BREAST LIMITED
TECHNIQUE: Left digital diagnostic mammography and breast tomosynthesis was
performed. The images were evaluated with computer-aided detection.;
Targeted ultrasound examination of the left breast was performed.

[L MLO synth-2D]
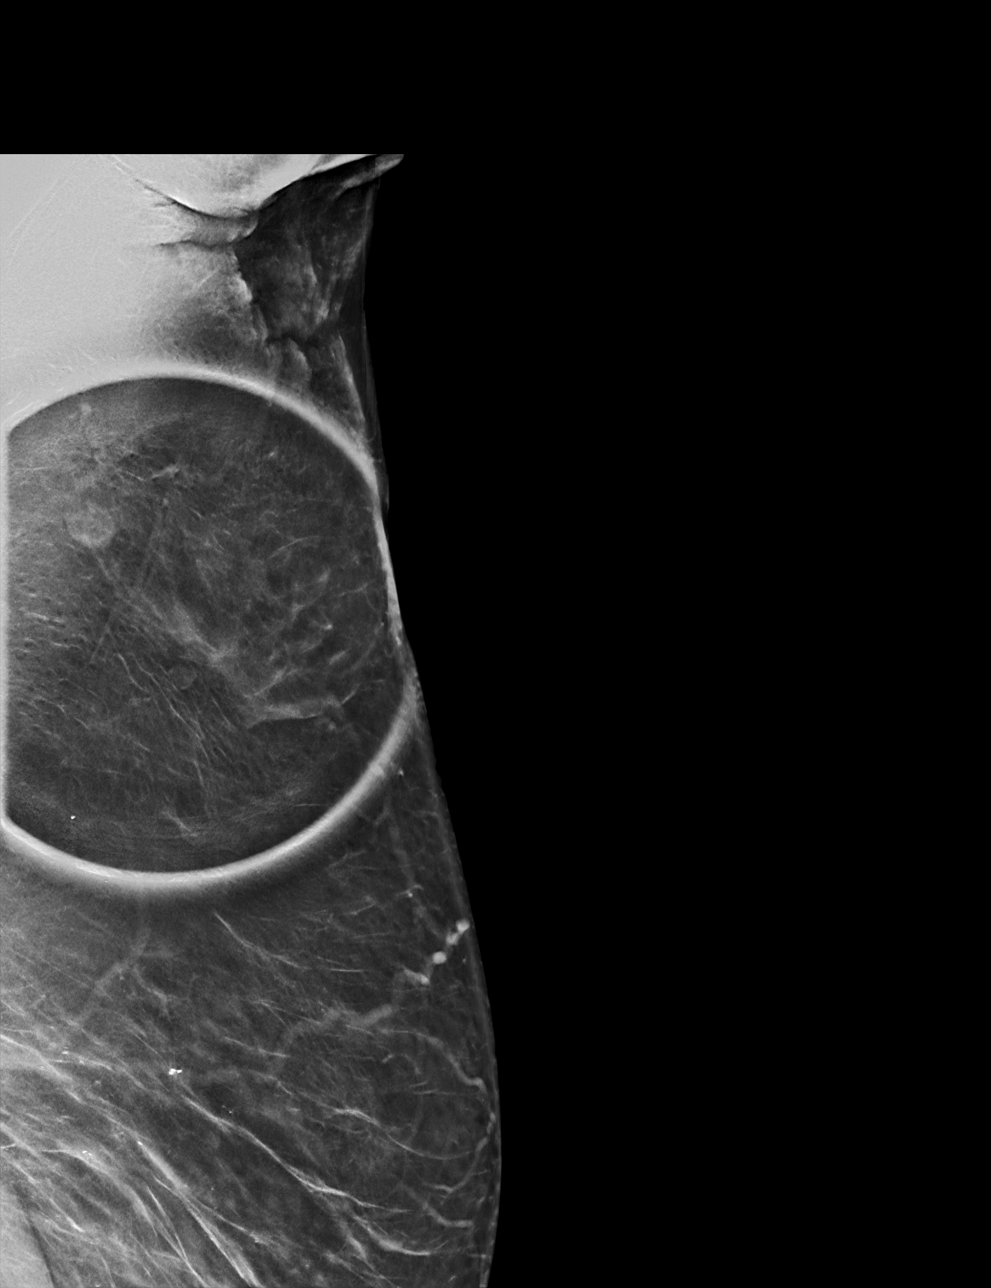

[L ML synth-2D]
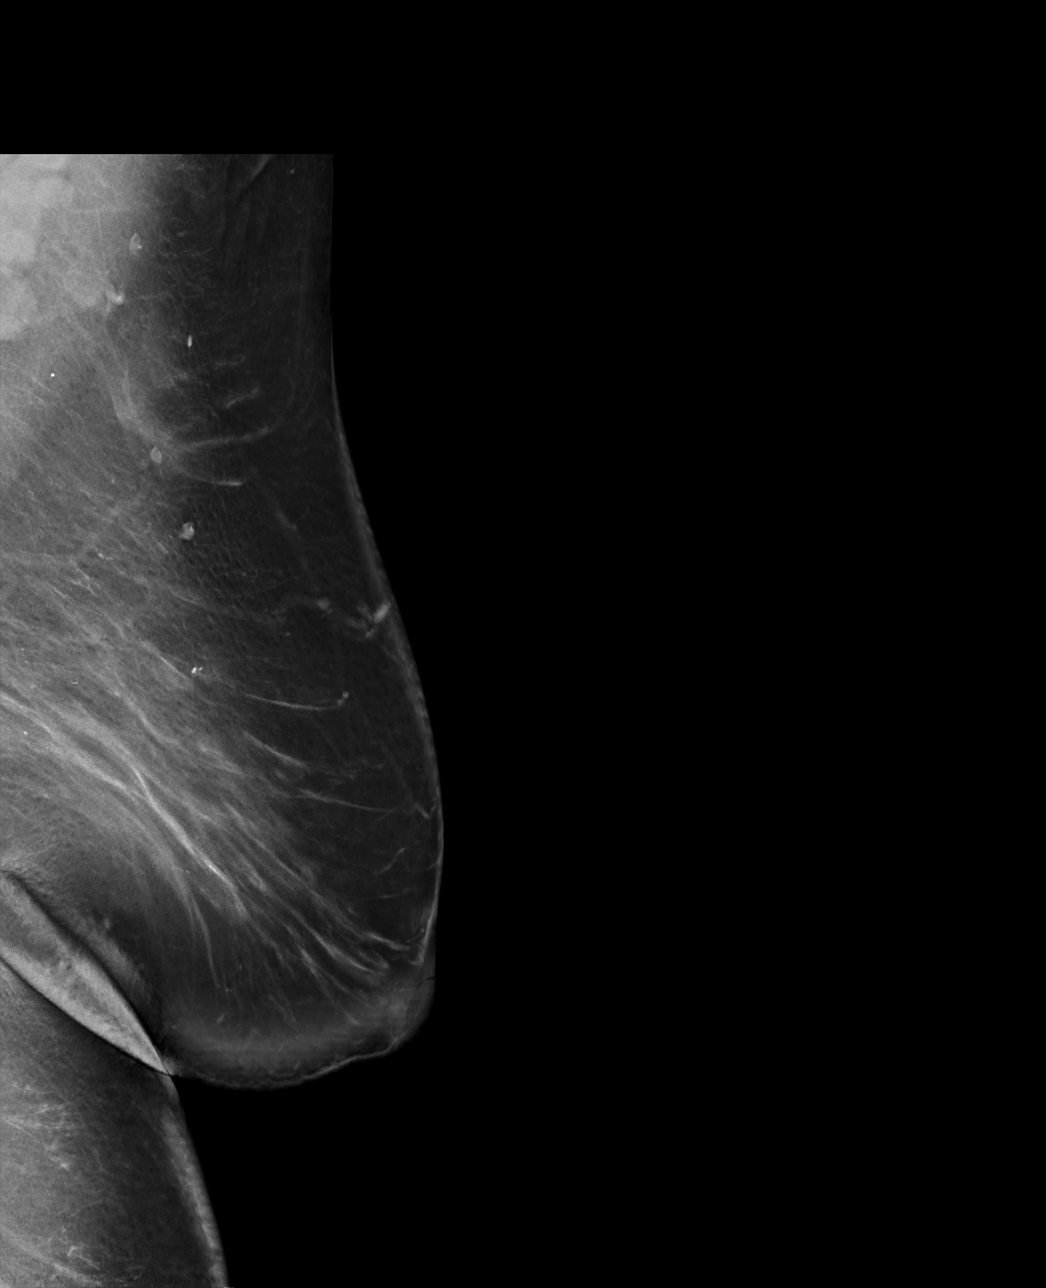

[L ML tomo · tomo slice 56/111.0]
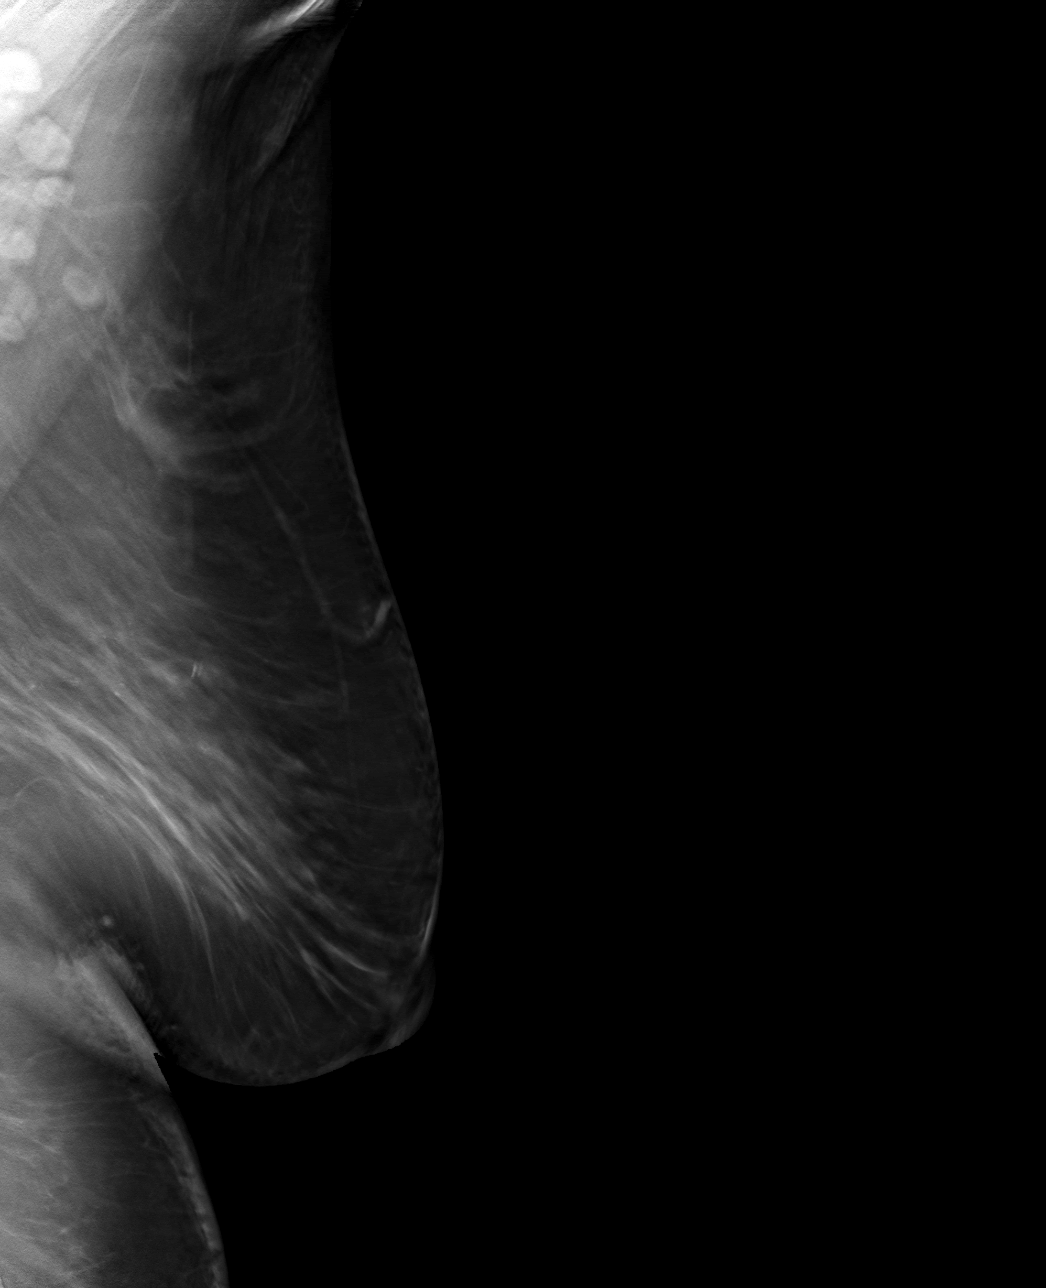

[L MLO tomo · tomo slice 43/86.0]
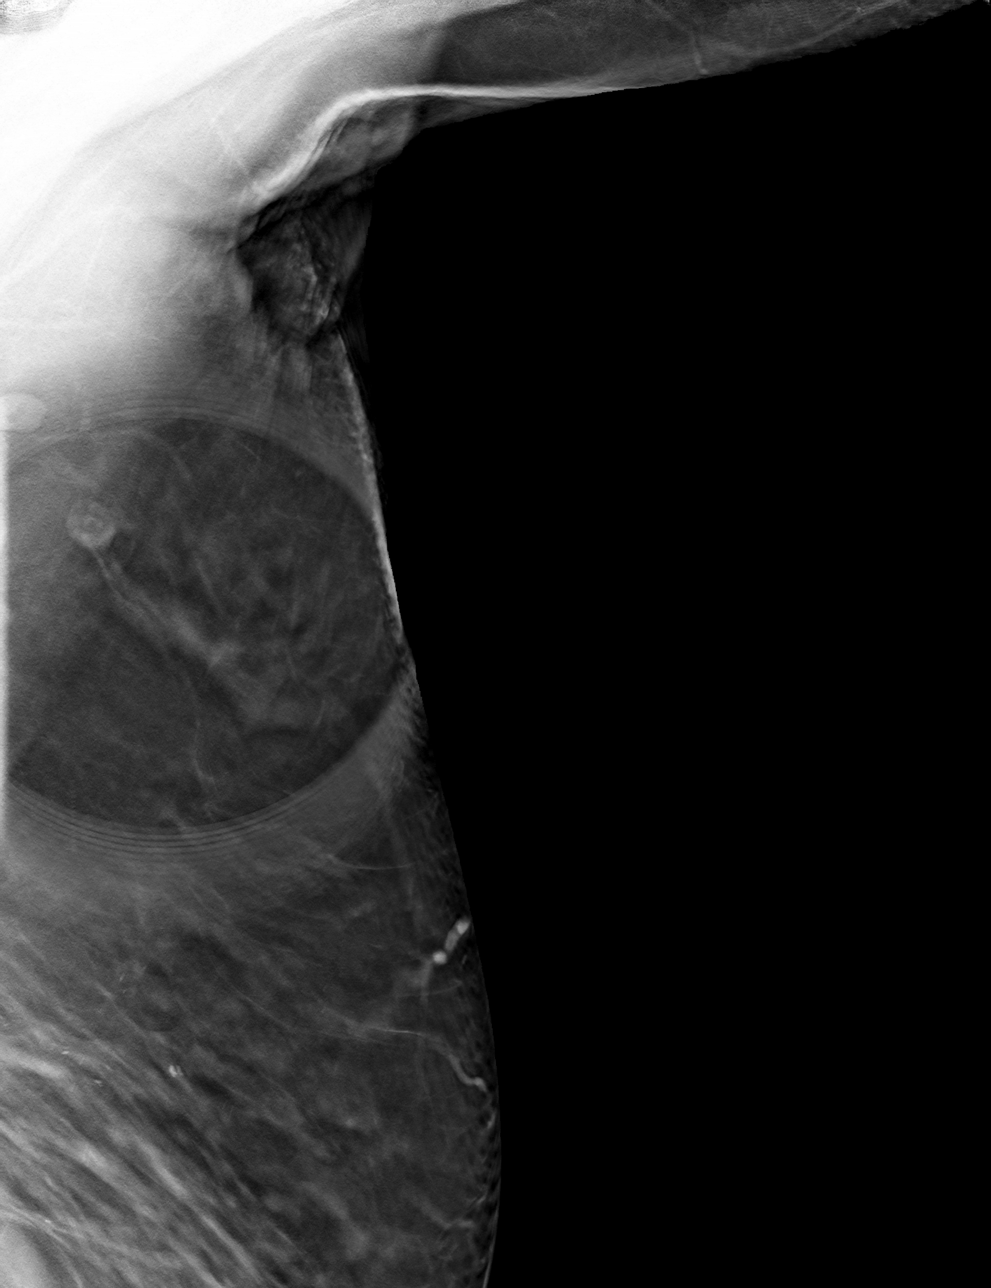

[4 of 12 positions shown; findings below may reference images not displayed]

ACR Breast Density Category c: The breast tissue is heterogeneously
dense, which may obscure small masses.
FINDINGS: Additional 2-D and 3-D images are performed. These views show
asymmetry in the UPPER-OUTER QUADRANT of the LEFT breast, similar in
appearance to multiple prior studies. There is no associated mass or
distortion in this region.

On physical exam, I palpate no discrete abnormality in the LATERAL
aspect of the LEFT breast.

Targeted ultrasound is performed, showing normal appearing
fibroglandular tissue throughout the LATERAL portions the LEFT
breast. Lymph nodes with normal morphology are seen in the LOWER
LEFT axilla. No suspicious mass, distortion, or acoustic shadowing
is demonstrated with ultrasound.
IMPRESSION: No mammographic or ultrasound evidence for malignancy. Asymmetrical
fibroglandular tissue in the UPPER OUTER QUADRANT of the LEFT
breast, showing long-term stability.

RECOMMENDATION:
Screening mammogram in one year.(Code:TO-C-QQV)

I have discussed the findings and recommendations with the patient.
If applicable, a reminder letter will be sent to the patient
regarding the next appointment.

BI-RADS CATEGORY  1: Negative.

## 2023-06-29 ENCOUNTER — Ambulatory Visit: Payer: Medicare Other | Admitting: Internal Medicine

## 2023-07-12 ENCOUNTER — Ambulatory Visit (INDEPENDENT_AMBULATORY_CARE_PROVIDER_SITE_OTHER): Payer: Medicare Other | Admitting: Internal Medicine

## 2023-07-12 ENCOUNTER — Encounter: Payer: Self-pay | Admitting: Internal Medicine

## 2023-07-12 VITALS — BP 120/70 | HR 100 | Temp 99.5°F | Ht 59.5 in | Wt 184.0 lb

## 2023-07-12 DIAGNOSIS — L910 Hypertrophic scar: Secondary | ICD-10-CM

## 2023-07-12 DIAGNOSIS — N182 Chronic kidney disease, stage 2 (mild): Secondary | ICD-10-CM | POA: Diagnosis not present

## 2023-07-12 DIAGNOSIS — E785 Hyperlipidemia, unspecified: Secondary | ICD-10-CM

## 2023-07-12 DIAGNOSIS — R944 Abnormal results of kidney function studies: Secondary | ICD-10-CM

## 2023-07-12 DIAGNOSIS — T7840XS Allergy, unspecified, sequela: Secondary | ICD-10-CM | POA: Diagnosis not present

## 2023-07-12 DIAGNOSIS — F4321 Adjustment disorder with depressed mood: Secondary | ICD-10-CM | POA: Diagnosis not present

## 2023-07-12 DIAGNOSIS — E669 Obesity, unspecified: Secondary | ICD-10-CM | POA: Diagnosis not present

## 2023-07-12 DIAGNOSIS — T7840XA Allergy, unspecified, initial encounter: Secondary | ICD-10-CM | POA: Insufficient documentation

## 2023-07-12 DIAGNOSIS — E66812 Obesity, class 2: Secondary | ICD-10-CM

## 2023-07-12 LAB — COMPREHENSIVE METABOLIC PANEL WITH GFR
ALT: 15 U/L (ref 0–35)
AST: 11 U/L (ref 0–37)
Albumin: 4.2 g/dL (ref 3.5–5.2)
Alkaline Phosphatase: 66 U/L (ref 39–117)
BUN: 14 mg/dL (ref 6–23)
CO2: 26 meq/L (ref 19–32)
Calcium: 9.7 mg/dL (ref 8.4–10.5)
Chloride: 105 meq/L (ref 96–112)
Creatinine, Ser: 0.97 mg/dL (ref 0.40–1.20)
GFR: 58.85 mL/min — ABNORMAL LOW (ref >60.00)
Glucose, Bld: 142 mg/dL — ABNORMAL HIGH (ref 70–99)
Potassium: 3.3 meq/L — ABNORMAL LOW (ref 3.5–5.1)
Sodium: 139 meq/L (ref 135–145)
Total Bilirubin: 0.5 mg/dL (ref 0.2–1.2)
Total Protein: 7.5 g/dL (ref 6.0–8.3)

## 2023-07-12 NOTE — Progress Notes (Addendum)
Subjective:  Patient ID: Regina Roberts, female    DOB: 11/08/1950  Age: 72 y.o. MRN: [id number]  CC: Follow-up (3 MNTH F/U)   HPI [employer] presents for grief, CRI, dyslipidemia  Outpatient Medications Prior to Visit  Medication Sig Dispense Refill   atorvastatin (LIPITOR) 10 MG tablet TAKE 1 TABLET BY MOUTH DAILY 90 tablet 1   b complex vitamins tablet Take 1 tablet by mouth daily. 100 tablet 3   CALCIUM PO Take 1 tablet by mouth daily.     Cholecalciferol (VITAMIN D-3) 1000 UNITS CAPS Take 1,000 Units by mouth daily.     Cyanocobalamin ([employer] VITAMIN B12 PO) Take 1 tablet by mouth daily.     hydrochlorothiazide (HYDRODIURIL) 12.5 MG tablet TAKE 1 TABLET BY MOUTH DAILY 90 tablet 1   losartan (COZAAR) 100 MG tablet TAKE 1 TABLET BY MOUTH DAILY 90 tablet 1   montelukast (SINGULAIR) 10 MG tablet TAKE 1 TABLET BY MOUTH EVERY DAY 90 tablet 1   Multiple Vitamins-Minerals (CENTRUM ADULTS) TABS Take 1 tablet by mouth daily.     naproxen sodium (ANAPROX) 220 MG tablet Take 220 mg by mouth as needed (headache or pain).     Omega-3 Fatty Acids (FISH OIL PO) Take 1 capsule by mouth daily.     pantoprazole (PROTONIX) 40 MG tablet TAKE 1 TABLET(40 MG) BY MOUTH DAILY 90 tablet 2   triamcinolone ointment (KENALOG) 0.5 % apply to affected area twice a day (Patient taking differently: Apply 1 application  topically once as needed (itching).) 60 g 3   No facility-administered medications prior to visit.    ROS: Review of Systems  Constitutional:  Negative for activity change, appetite change, chills, fatigue and unexpected weight change.  HENT:  Negative for congestion, mouth sores and sinus pressure.   Eyes:  Negative for visual disturbance.  Respiratory:  Negative for cough and chest tightness.   Gastrointestinal:  Negative for abdominal pain and nausea.  Genitourinary:  Negative for difficulty urinating, frequency and vaginal pain.  Musculoskeletal:  Negative for back pain  and gait problem.  Skin:  Negative for pallor and rash.  Neurological:  Negative for dizziness, tremors, weakness, numbness and headaches.  Psychiatric/Behavioral:  Negative for confusion and sleep disturbance.     Objective:  BP 120/70 (BP Location: Left Arm, Patient Position: Sitting, Cuff Size: Large)   Pulse 100   Temp 99.5 F (37.5 C) (Oral)   Ht 4' 11.5" (1.511 m)   Wt 184 lb (83.5 kg)   SpO2 97%   BMI 36.54 kg/m   BP Readings from Last 3 Encounters:  07/12/23 120/70  03/28/23 110/70  12/21/22 122/62    Wt Readings from Last 3 Encounters:  07/12/23 184 lb (83.5 kg)  03/28/23 181 lb (82.1 kg)  12/21/22 182 lb (82.6 kg)    Physical Exam Constitutional:      General: She is not in acute distress.    Appearance: She is well-developed. She is obese.  HENT:     Head: Normocephalic.     Right Ear: External ear normal.     Left Ear: External ear normal.     Nose: Nose normal.  Eyes:     General:        Right eye: No discharge.        Left eye: No discharge.     Conjunctiva/sclera: Conjunctivae normal.     Pupils: Pupils are equal, round, and reactive to light.  Neck:     Thyroid:  No thyromegaly.     Vascular: No JVD.     Trachea: No tracheal deviation.  Cardiovascular:     Rate and Rhythm: Normal rate and regular rhythm.     Heart sounds: Normal heart sounds.  Pulmonary:     Effort: No respiratory distress.     Breath sounds: No stridor. No wheezing.  Abdominal:     General: Bowel sounds are normal. There is no distension.     Palpations: Abdomen is soft. There is no mass.     Tenderness: There is no abdominal tenderness. There is no guarding or rebound.  Musculoskeletal:        General: No tenderness.     Cervical back: Normal range of motion and neck supple. No rigidity.     Right lower leg: No edema.     Left lower leg: No edema.  Lymphadenopathy:     Cervical: No cervical adenopathy.  Skin:    Findings: No erythema or rash.  Neurological:      Mental Status: She is oriented to person, place, and time.     Cranial Nerves: No cranial nerve deficit.     Motor: No abnormal muscle tone.     Coordination: Coordination normal.     Deep Tendon Reflexes: Reflexes normal.  Psychiatric:        Behavior: Behavior normal.        Thought Content: Thought content normal.        Judgment: Judgment normal.   Sad   Lab Results  Component Value Date   WBC 12.2 (H) 03/28/2023   HGB 11.0 (L) 03/28/2023   HCT 35.0 (L) 03/28/2023   PLT 414.0 (H) 03/28/2023   GLUCOSE 97 03/28/2023   CHOL 159 03/08/2021   TRIG 143 04/14/2022   HDL 38.40 (L) 03/08/2021   LDLCALC 94 03/08/2021   ALT 12 03/28/2023   AST 11 03/28/2023   NA 141 03/28/2023   K 4.2 03/28/2023   CL 104 03/28/2023   CREATININE 0.95 03/28/2023   BUN 18 03/28/2023   CO2 30 03/28/2023   TSH 0.69 09/20/2022   INR 1.2 04/13/2022   HGBA1C 6.4 03/28/2023    No results found.  Assessment & Plan:   Problem List Items Addressed This Visit     Dyslipidemia    On Lipitor       Obesity, Class II, BMI 35-39.9    On diet now      Decreased GFR - Primary    Hydrate well      Relevant Orders   Comprehensive metabolic panel   CKD (chronic kidney disease) stage 2, GFR 60-89 ml/min    Hydrate well Monitor GFR      Keloid scar    Better now      Grief    Bobby died in 04-16-2023     Allergy    On Allegra  - seasonal sx's         No orders of the defined types were placed in this encounter.     Follow-up: No follow-ups on file.  Sonda Primes, MD

## 2023-07-12 NOTE — Assessment & Plan Note (Addendum)
On Allegra  - seasonal sx's

## 2023-07-12 NOTE — Assessment & Plan Note (Signed)
Better now

## 2023-07-12 NOTE — Assessment & Plan Note (Signed)
Hydrate well

## 2023-07-12 NOTE — Assessment & Plan Note (Signed)
Hydrate well ?Monitor GFR ?

## 2023-07-12 NOTE — Assessment & Plan Note (Signed)
Bobby died in June 2024

## 2023-07-12 NOTE — Assessment & Plan Note (Signed)
On diet now

## 2023-07-12 NOTE — Assessment & Plan Note (Signed)
On Lipitor 

## 2023-07-14 ENCOUNTER — Other Ambulatory Visit: Payer: Self-pay | Admitting: Internal Medicine

## 2023-07-14 DIAGNOSIS — R600 Localized edema: Secondary | ICD-10-CM

## 2023-07-14 DIAGNOSIS — I10 Essential (primary) hypertension: Secondary | ICD-10-CM

## 2023-07-14 MED ORDER — POTASSIUM CHLORIDE CRYS ER 20 MEQ PO TBCR: 20.0000 meq | EXTENDED_RELEASE_TABLET | Freq: Every day | ORAL | 0 refills | Status: DC

## 2023-07-14 NOTE — Assessment & Plan Note (Signed)
New low potassium of 3.3.  It is likely related to the use of diuretic (hydrochlorothiazide).  I will send a prescription for potassium for her to take for 1 month.  She can stop taking hydrochlorothiazide.

## 2023-07-14 NOTE — Assessment & Plan Note (Signed)
New low potassium of 3.3.  It is likely related to the use of diuretic (hydrochlorothiazide).  I will send a prescription for potassium for her to take for 1 month.  She can stop taking hydrochlorothiazide.  May need to restart the diuretic if swelling reoccurs  Continue with weight loss effort

## 2023-08-07 ENCOUNTER — Other Ambulatory Visit: Payer: Self-pay | Admitting: Radiology

## 2023-08-07 DIAGNOSIS — Z1231 Encounter for screening mammogram for malignant neoplasm of breast: Secondary | ICD-10-CM

## 2023-08-18 ENCOUNTER — Telehealth: Payer: Self-pay | Admitting: Internal Medicine

## 2023-08-18 DIAGNOSIS — R739 Hyperglycemia, unspecified: Secondary | ICD-10-CM

## 2023-08-18 DIAGNOSIS — I10 Essential (primary) hypertension: Secondary | ICD-10-CM

## 2023-08-18 NOTE — Telephone Encounter (Signed)
Pt called and stated that her potassium chloride SA (KLOR-CON M) 20 MEQ tablet is finished her 30 days of taking it is up and wants to know what's the next step. Pt wants to know do she start back her other medications before starting this one or she still need to hold off? & Will this pt need a follow up appointment with you?  Please advise.

## 2023-08-24 NOTE — Telephone Encounter (Addendum)
Check lab work in January 2025 (ordered).  No need to renew potassium tablets at this point.  Thank you

## 2023-08-24 NOTE — Telephone Encounter (Signed)
I was able to speak with the pt and give her Dr. Loren Racer instructions. Pt states understanding and has no questions or concerns at this time.

## 2023-09-06 ENCOUNTER — Ambulatory Visit: Payer: Medicare Other

## 2023-09-06 ENCOUNTER — Telehealth: Payer: Self-pay

## 2023-09-06 VITALS — Ht 59.5 in | Wt 184.0 lb

## 2023-09-06 DIAGNOSIS — Z0000 Encounter for general adult medical examination without abnormal findings: Secondary | ICD-10-CM

## 2023-09-06 NOTE — Progress Notes (Addendum)
Subjective:   Regina Roberts is a 72 y.o. female who presents for Medicare Annual (Subsequent) preventive examination.  Visit Complete: Virtual I connected with  Jacqualin Combes Ostermiller on 09/06/23 by a audio enabled telemedicine application and verified that I am speaking with the correct person using two identifiers.  Patient Location: Home  Provider Location: Office/Clinic  I discussed the limitations of evaluation and management by telemedicine. The patient expressed understanding and agreed to proceed.  Vital Signs: Because this visit was a virtual/telehealth visit, some criteria may be missing or patient reported. Any vitals not documented were not able to be obtained and vitals that have been documented are patient reported.  Cardiac Risk Factors include: advanced age (>78men, >51 women)     Objective:    Today's Vitals   09/06/23 1134  Weight: 184 lb (83.5 kg)  Height: 4' 11.5" (1.511 m)  PainSc: 0-No pain   Body mass index is 36.54 kg/m.     09/06/2023   11:35 AM 04/01/2022    5:17 PM 03/22/2022    8:30 AM 03/16/2022    2:44 PM 03/12/2021    7:59 PM 09/07/2020   11:32 AM 09/05/2019    2:08 PM  Advanced Directives  Does Patient Have a Medical Advance Directive? No No No No No No No  Does patient want to make changes to medical advance directive?       No - Patient declined  Would patient like information on creating a medical advance directive? No - Patient declined No - Patient declined  No - Patient declined No - Patient declined No - Patient declined     Current Medications (verified) Outpatient Encounter Medications as of 09/06/2023  Medication Sig   atorvastatin (LIPITOR) 10 MG tablet TAKE 1 TABLET BY MOUTH DAILY   b complex vitamins tablet Take 1 tablet by mouth daily.   CALCIUM PO Take 1 tablet by mouth daily.   Cholecalciferol (VITAMIN D-3) 1000 UNITS CAPS Take 1,000 Units by mouth daily.   Cyanocobalamin ([employer] VITAMIN B12 PO) Take 1 tablet by  mouth daily.   losartan (COZAAR) 100 MG tablet TAKE 1 TABLET BY MOUTH DAILY   montelukast (SINGULAIR) 10 MG tablet TAKE 1 TABLET BY MOUTH EVERY DAY   Multiple Vitamins-Minerals (CENTRUM ADULTS) TABS Take 1 tablet by mouth daily.   naproxen sodium (ANAPROX) 220 MG tablet Take 220 mg by mouth as needed (headache or pain).   Omega-3 Fatty Acids (FISH OIL PO) Take 1 capsule by mouth daily.   pantoprazole (PROTONIX) 40 MG tablet TAKE 1 TABLET(40 MG) BY MOUTH DAILY   potassium chloride SA (KLOR-CON M) 20 MEQ tablet Take 1 tablet (20 mEq total) by mouth daily.   triamcinolone ointment (KENALOG) 0.5 % apply to affected area twice a day (Patient taking differently: Apply 1 application  topically once as needed (itching).)   No facility-administered encounter medications on file as of 09/06/2023.    Allergies (verified) Latex, Lisinopril, Phentermine, and Iodinated contrast media   History: Past Medical History:  Diagnosis Date   Allergy    sesonal   Blood transfusion without reported diagnosis 1984   Diverticulitis of colon with perforation 02/28/2022   GERD (gastroesophageal reflux disease)    Hyperlipidemia    Hypertension    Osteopenia 10/2018   T score -1.8 FRAX 3.4% / 0.3% able from prior DEXA   Past Surgical History:  Procedure Laterality Date   HERNIA REPAIR  2000   IR RADIOLOGIST EVAL & MGMT  05/03/2022  LAPAROTOMY N/A 03/20/2021   Procedure: LAPAROTOMY;  Surgeon: Berna Bue, MD;  Location: [facility] OR;  Service: General;  Laterality: N/A;  180   LYSIS OF ADHESION N/A 04/01/2022   Procedure: LYSIS OF ADHESION;  Surgeon: Karie Soda, MD;  Location: [facility] ORS;  Service: General;  Laterality: N/A;   PARTIAL COLECTOMY N/A 03/20/2021   Procedure: PARTIAL COLECTOMY WITH COLOSTOMY;  Surgeon: Berna Bue, MD;  Location: [facility] OR;  Service: General;  Laterality: N/A;   PELVIC LAPAROSCOPY  1996   PROCTOSCOPY N/A 04/01/2022   Procedure: RIGID PROCTOSCOPY;  Surgeon: Karie Soda, MD;   Location: [facility] ORS;  Service: General;  Laterality: N/A;   SMALL BOWEL REPAIR  04/01/2022   Procedure: SMALL BOWEL REPAIR;  Surgeon: Karie Soda, MD;  Location: [facility] ORS;  Service: General;;   SUPRACERVICAL ABDOMINAL HYSTERECTOMY  2003   with RSO. Leiomyoma menorrhagia with 16-18 week size uterus   XI ROBOTIC ASSISTED COLOSTOMY TAKEDOWN N/A 04/01/2022   Procedure: ROBOTIC OSTOMY TAKEDOWN, TAKEDOWN COLORECTAL STUMP FISTULA, VAGINAL CUFF REPAIR, AND BILATERAL TAP BLOCK;  Surgeon: Karie Soda, MD;  Location: [facility] ORS;  Service: General;  Laterality: N/A;   Family History  Problem Relation Age of Onset   Heart disease Mother 8   Cancer Mother 21       neck ca    Stroke Father 93   Other Brother        train accident   Other Brother        respiratory   Other Brother        liver failure ?   Breast cancer Maternal Grandmother        untreated   Other Daughter        suicide   Social History   Socioeconomic History   Marital status: Married    Spouse name: Not on file   Number of children: 1   Years of education: Not on file   Highest education level: Not on file  Occupational History   Occupation: retired  Tobacco Use   Smoking status: Never   Smokeless tobacco: Never  Vaping Use   Vaping status: Never Used  Substance and Sexual Activity   Alcohol use: Not Currently   Drug use: No   Sexual activity: Not Currently    Partners: Male    Birth control/protection: Surgical    Comment: 1st intercourse 18, yo-5 partners, hysterectomy  Other Topics Concern   Not on file  Social History Narrative   Not on file   Social Determinants of Health   Financial Resource Strain: Low Risk  (09/06/2023)   Overall Financial Resource Strain (CARDIA)    Difficulty of Paying Living Expenses: Not hard at all  Food Insecurity: No Food Insecurity (09/06/2023)   Hunger Vital Sign    Worried About Running Out of Food in the Last Year: Never true    Ran Out of Food in the Last Year: Never true   Transportation Needs: No Transportation Needs (09/06/2023)   PRAPARE - Administrator, Civil Service (Medical): No    Lack of Transportation (Non-Medical): No  Physical Activity: Sufficiently Active (09/06/2023)   Exercise Vital Sign    Days of Exercise per Week: 5 days    Minutes of Exercise per Session: 30 min  Stress: No Stress Concern Present (09/06/2023)   [employer] of Occupational Health - Occupational Stress Questionnaire    Feeling of Stress : Not at all  Social Connections: Socially Integrated (09/06/2023)  Social Advertising account executive [NHANES]    Frequency of Communication with Friends and Family: More than three times a week    Frequency of Social Gatherings with Friends and Family: Once a week    Attends Religious Services: More than 4 times per year    Active Member of [employer] or Organizations: Yes    Attends Engineer, structural: More than 4 times per year    Marital Status: Married    Tobacco Counseling Counseling given: Not Answered   Clinical Intake:  Pre-visit preparation completed: Yes  Pain : No/denies pain Pain Score: 0-No pain     BMI - recorded: 36.54 Nutritional Status: BMI > 30  Obese Nutritional Risks: None Diabetes: No  How often do you need to have someone help you when you read instructions, pamphlets, or other written materials from your doctor or pharmacy?: 1 - Never What is the last grade level you completed in school?: 2 YEARS OF COLLEGE  Interpreter Needed?: No  Information entered by :: Saida Lonon N. Erum Cercone, LPN.   Activities of Daily Living    09/06/2023   11:38 AM  In your present state of health, do you have any difficulty performing the following activities:  Hearing? 0  Vision? 0  Difficulty concentrating or making decisions? 0  Walking or climbing stairs? 0  Dressing or bathing? 0  Doing errands, shopping? 0  Preparing Food and eating ? N  Using the Toilet? N  In the past six  months, have you accidently leaked urine? N  Do you have problems with loss of bowel control? N  Managing your Medications? N  Managing your Finances? N  Housekeeping or managing your Housekeeping? N    Patient Care Team: Plotnikov, Georgina Quint, MD as PCP - General Pyrtle, Carie Caddy, MD as Consulting Physician (Gastroenterology) Berna Bue, MD as Consulting Physician (General Surgery) Jethro Bolus, MD as Consulting Physician (Ophthalmology) Karie Soda, MD as Consulting Physician (Colon and Rectal Surgery) Genia Del, MD as Consulting Physician (Obstetrics and Gynecology)  Indicate any recent Medical Services you may have received from other than [facility] providers in the past year (date may be approximate).     Assessment:   This is a routine wellness examination for [facility]  Hearing/Vision screen Hearing Screening - Comments:: Patient denied any hearing difficulty.   No hearing aids.   Vision Screening - Comments:: Patient does wear otc readers.  Eye exam done by: Jethro Bolus, MD. (Recently retired)    Goals Addressed             This Visit's Progress    Client understands the importance of follow-up with providers by attending scheduled visits        Depression Screen    09/06/2023   11:36 AM 07/12/2023    1:29 PM 03/28/2023    3:05 PM 12/21/2022    2:00 PM 09/20/2022   11:02 AM 03/16/2022    2:45 PM 03/16/2022   10:02 AM  PHQ 2/9 Scores  PHQ - 2 Score 0 0 0 0 0 0 0  PHQ- 9 Score 0          Fall Risk    09/06/2023   11:36 AM 07/12/2023    1:28 PM 03/28/2023    3:05 PM 12/21/2022    2:00 PM 09/20/2022   11:02 AM  Fall Risk   Falls in the past year? 0 0 0 0 0  Number falls in past yr: 0 0 0 0 0  Injury with Fall? 0 0 0 0 0  Risk for fall due to : No Fall Risks No Fall Risks No Fall Risks No Fall Risks No Fall Risks  Follow up Falls prevention discussed Falls evaluation completed Falls evaluation completed Falls evaluation completed Falls evaluation  completed    MEDICARE RISK AT HOME: Medicare Risk at Home Any stairs in or around the home?: No If so, are there any without handrails?: No Home free of loose throw rugs in walkways, pet beds, electrical cords, etc?: Yes Adequate lighting in your home to reduce risk of falls?: Yes Life alert?: No Use of a cane, walker or w/c?: No Grab bars in the bathroom?: No Shower chair or bench in shower?: Yes Elevated toilet seat or a handicapped toilet?: No  TIMED UP AND GO:  Was the test performed?  No    Cognitive Function:    09/06/2023   11:40 AM  MMSE - Mini Mental State Exam  Not completed: Unable to complete        09/06/2023   11:39 AM 03/16/2022    2:46 PM 09/07/2020   11:33 AM  6CIT Screen  What Year? 0 points 0 points 0 points  What month? 0 points 0 points 0 points  What time? 0 points 0 points 0 points  Count back from 20 0 points 0 points 0 points  Months in reverse 0 points 0 points 0 points  Repeat phrase 0 points 0 points 0 points  Total Score 0 points 0 points 0 points    Immunizations Immunization History  Administered Date(s) Administered   Fluad Quad(high Dose 65+) 07/29/2019, 08/05/2020, 08/30/2022   Influenza Whole 07/16/2008   Influenza, High Dose Seasonal PF 07/16/2015, 08/03/2018, 08/06/2021   Influenza,inj,Quad PF,6+ Mos 07/22/2016   Influenza-Unspecified 08/13/2014, 09/07/2017   PFIZER(Purple Top)SARS-COV-2 Vaccination 12/21/2019, 01/11/2020, 08/06/2020   Pfizer Covid-19 Vaccine Bivalent Booster 72yrs & up 08/06/2021   Pfizer(Comirnaty)Fall Seasonal Vaccine 12 years and older 08/18/2023   Pneumococcal Conjugate-13 08/27/2018   Pneumococcal Polysaccharide-23 09/07/2020   Tdap 08/01/2016   Zoster Recombinant(Shingrix) 11/19/2019, 06/20/2020    TDAP status: Up to date  Flu Vaccine status: Up to date  Pneumococcal vaccine status: Up to date  Covid-19 vaccine status: Completed vaccines  Qualifies for Shingles Vaccine? Yes   Zostavax  completed No   Shingrix Completed?: Yes  Screening Tests Health Maintenance  Topic Date Due   Hepatitis C Screening  Never done   COVID-19 Vaccine (6 - 2023-24 season) 10/13/2023   Medicare Annual Wellness (AWV)  09/05/2024   MAMMOGRAM  09/12/2024   DTaP/Tdap/Td (2 - Td or Tdap) 08/01/2026   Colonoscopy  08/04/2027   Pneumonia Vaccine 74+ Years old  Completed   INFLUENZA VACCINE  Completed   DEXA SCAN  Completed   Zoster Vaccines- Shingrix  Completed   HPV VACCINES  Aged Out    Health Maintenance  Health Maintenance Due  Topic Date Due   Hepatitis C Screening  Never done    Colorectal cancer screening: Type of screening: Colonoscopy. Completed 08/03/2017. Repeat every 10 years  Mammogram status: Completed 09/12/2022. Repeat every year  Bone Density status: Completed 10/05/2022. Results reflect: Bone density results: OSTEOPENIA. Repeat every 2 years.  Lung Cancer Screening: (Low Dose CT Chest recommended if Age 76-80 years, 20 pack-year currently smoking OR have quit w/in 15years.) does not qualify.   Lung Cancer Screening Referral: no  Additional Screening:  Hepatitis C Screening: does qualify; Completed: No  Vision Screening: Recommended annual ophthalmology  exams for early detection of glaucoma and other disorders of the eye. Is the patient up to date with their annual eye exam?  Yes  Who is the provider or what is the name of the office in which the patient attends annual eye exams? Jethro Bolus, MD. If pt is not established with a provider, would they like to be referred to a provider to establish care? No .   Dental Screening: Recommended annual dental exams for proper oral hygiene  Diabetic Foot Exam: N/A  Community Resource Referral / Chronic Care Management: CRR required this visit?  No   CCM required this visit?  No     Plan:     I have personally reviewed and noted the following in the patient's chart:   Medical and social history Use of  alcohol, tobacco or illicit drugs  Current medications and supplements including opioid prescriptions. Patient is not currently taking opioid prescriptions. Functional ability and status Nutritional status Physical activity Advanced directives List of other physicians Hospitalizations, surgeries, and ER visits in previous 12 months Vitals Screenings to include cognitive, depression, and falls Referrals and appointments  In addition, I have reviewed and discussed with patient certain preventive protocols, quality metrics, and best practice recommendations. A written personalized care plan for preventive services as well as general preventive health recommendations were provided to patient.     Mickeal Needy, LPN   16/07/9603   After Visit Summary: (MyChart) Due to this being a telephonic visit, the after visit summary with patients personalized plan was offered to patient via MyChart   Nurse Notes: None   Medical screening examination/treatment/procedure(s) were performed by non-physician practitioner and as supervising physician I was immediately available for consultation/collaboration.  I agree with above. Jacinta Shoe, MD

## 2023-09-06 NOTE — Patient Instructions (Addendum)
Ms. Stoldt , Thank you for taking time to come for your Medicare Wellness Visit. I appreciate your ongoing commitment to your health goals. Please review the following plan we discussed and let me know if I can assist you in the future.   Referrals/Orders/Follow-Ups/Clinician Recommendations: No  This is a list of the screening recommended for you and due dates:  Health Maintenance  Topic Date Due   Hepatitis C Screening  Never done   COVID-19 Vaccine (6 - 2023-24 season) 10/13/2023   Medicare Annual Wellness Visit  09/05/2024   Mammogram  09/12/2024   DTaP/Tdap/Td vaccine (2 - Td or Tdap) 08/01/2026   Colon Cancer Screening  08/04/2027   Pneumonia Vaccine  Completed   Flu Shot  Completed   DEXA scan (bone density measurement)  Completed   Zoster (Shingles) Vaccine  Completed   HPV Vaccine  Aged Out    Advanced directives: No; Patient is working on completing paperwork.  Next Medicare Annual Wellness Visit scheduled for next year: Yes

## 2023-09-06 NOTE — Telephone Encounter (Signed)
Patient stated that she was advised to check bp and keep record of readings.  Patient stated that on 09/04/2023 her bp was 179/104.  She stated that she did not have any symptoms and that she is taking her medications.  Patient has been dealing with the loss of her husband.

## 2023-09-11 MED ORDER — TRIAMTERENE-HCTZ 37.5-25 MG PO TABS: 1.0000 | ORAL_TABLET | Freq: Every day | ORAL | 11 refills | Status: DC

## 2023-09-11 NOTE — Telephone Encounter (Signed)
I have sent a prescription for Maxide to add to her regimen.  Please follow-up with me or one of my partners this or next month.  Thank you

## 2023-09-13 NOTE — Telephone Encounter (Signed)
Spoke with pt and was ab le to inform her of the added medication per the provider advice to add to help with her BP. Pt is to f/u per PCP orders in 1 month after starting the Maxide.

## 2023-09-14 ENCOUNTER — Ambulatory Visit
Admission: RE | Admit: 2023-09-14 | Discharge: 2023-09-14 | Disposition: A | Discharge status: Home / Self Care | Payer: Medicare Other | Source: Ambulatory Visit | Attending: Radiology | Admitting: Radiology

## 2023-09-14 DIAGNOSIS — Z1231 Encounter for screening mammogram for malignant neoplasm of breast: Secondary | ICD-10-CM

## 2023-09-29 ENCOUNTER — Ambulatory Visit: Payer: Medicare Other | Admitting: Obstetrics & Gynecology

## 2023-09-29 ENCOUNTER — Ambulatory Visit: Payer: Medicare Other | Admitting: Radiology

## 2023-10-02 ENCOUNTER — Ambulatory Visit (INDEPENDENT_AMBULATORY_CARE_PROVIDER_SITE_OTHER): Payer: Medicare Other | Admitting: Obstetrics and Gynecology

## 2023-10-02 ENCOUNTER — Encounter: Payer: Self-pay | Admitting: Obstetrics and Gynecology

## 2023-10-02 VITALS — BP 118/76 | Ht 59.5 in | Wt 187.0 lb

## 2023-10-02 DIAGNOSIS — N8185 Cervical stump prolapse: Secondary | ICD-10-CM | POA: Diagnosis not present

## 2023-10-02 DIAGNOSIS — Z1331 Encounter for screening for depression: Secondary | ICD-10-CM

## 2023-10-02 DIAGNOSIS — Z9189 Other specified personal risk factors, not elsewhere classified: Secondary | ICD-10-CM | POA: Diagnosis not present

## 2023-10-02 DIAGNOSIS — M8589 Other specified disorders of bone density and structure, multiple sites: Secondary | ICD-10-CM

## 2023-10-02 NOTE — Progress Notes (Signed)
72 y.o. y.o. female here for annual exam. No LMP recorded. Patient has had a hysterectomy.    HPI: Cervical stump prolapse. Prior supracervical hysterectomy. Using ring pessary occasionally. Not bothered by prolapse.  Husband passed recently.  Son in [place].  Originally from [place]. Plans to stay in [place]. Postmenopause, well on no HRT.  No PMB.  No pelvic pain. Pap smear Neg 09/2017. No significant history of abnormal Pap smears.  Pap reflex done today. Breasts normal.  Mammogram 08/2023 Neg.  Colonoscopy 2018 Neg. Osteopenia. DEXA 10/2018. T score -1.8 at [facility], FRAX 3.4% / 0.3%,, 2023 [facility] -2.0.  Encourage weightbearing exercise, continue with frequent walking and continued calcium and vitamin D supplement.  BMI 36.25. Health labs with Fam MD.   Body mass index is 37.14 kg/m.     10/02/2023    8:03 AM 09/06/2023   11:36 AM 07/12/2023    1:29 PM  Depression screen PHQ 2/9  Decreased Interest 1 0 0  Down, Depressed, Hopeless 1 0 0  PHQ - 2 Score 2 0 0  Altered sleeping 0 0   Tired, decreased energy 0 0   Change in appetite 0 0   Feeling bad or failure about yourself  0 0   Trouble concentrating 0 0   Moving slowly or fidgety/restless 0 0   Suicidal thoughts 0 0   PHQ-9 Score 2 0   Difficult doing work/chores  Not difficult at all     Blood pressure 118/76, height 4' 11.5" (1.511 m), weight 187 lb (84.8 kg).     Component Value Date/Time   DIAGPAP  09/27/2022 1520    - Negative for Intraepithelial Lesions or Malignancy (NILM)   DIAGPAP - Benign reactive/reparative changes 09/27/2022 1520   ADEQPAP  09/27/2022 1520    Satisfactory for evaluation; transformation zone component PRESENT.    GYN HISTORY:    Component Value Date/Time   DIAGPAP  09/27/2022 1520    - Negative for Intraepithelial Lesions or Malignancy (NILM)   DIAGPAP - Benign reactive/reparative changes 09/27/2022 1520   ADEQPAP  09/27/2022 1520    Satisfactory for evaluation; transformation  zone component PRESENT.    OB History  Gravida Para Term Preterm AB Living  3 2 2   1 1   SAB IAB Ectopic Multiple Live Births  1       2    # Outcome Date GA Lbr Len/2nd Weight Sex Type Anes PTL Lv  3 Term           2 SAB           1 Term             Past Medical History:  Diagnosis Date   Allergy    sesonal   Blood transfusion without reported diagnosis 1984   Diverticulitis of colon with perforation 02/28/2022   GERD (gastroesophageal reflux disease)    Hyperlipidemia    Hypertension    Osteopenia 10/2018   T score -1.8 FRAX 3.4% / 0.3% able from prior DEXA    Past Surgical History:  Procedure Laterality Date   HERNIA REPAIR  2000   IR RADIOLOGIST EVAL & MGMT  05/03/2022   LAPAROTOMY N/A 03/20/2021   Procedure: LAPAROTOMY;  Surgeon: Berna Bue, MD;  Location: [facility];  Service: General;  Laterality: N/A;  180   LYSIS OF ADHESION N/A 04/01/2022   Procedure: LYSIS OF ADHESION;  Surgeon: Karie Soda, MD;  Location: [facility];  Service:  General;  Laterality: N/A;   PARTIAL COLECTOMY N/A 03/20/2021   Procedure: PARTIAL COLECTOMY WITH COLOSTOMY;  Surgeon: Berna Bue, MD;  Location: [facility];  Service: General;  Laterality: N/A;   PELVIC LAPAROSCOPY  1996   PROCTOSCOPY N/A 04/01/2022   Procedure: RIGID PROCTOSCOPY;  Surgeon: Karie Soda, MD;  Location: [facility];  Service: General;  Laterality: N/A;   SMALL BOWEL REPAIR  04/01/2022   Procedure: SMALL BOWEL REPAIR;  Surgeon: Karie Soda, MD;  Location: [facility];  Service: General;;   SUPRACERVICAL ABDOMINAL HYSTERECTOMY  2003   with RSO. Leiomyoma menorrhagia with 16-18 week size uterus   XI ROBOTIC ASSISTED COLOSTOMY TAKEDOWN N/A 04/01/2022   Procedure: ROBOTIC OSTOMY TAKEDOWN, TAKEDOWN COLORECTAL STUMP FISTULA, VAGINAL CUFF REPAIR, AND BILATERAL TAP BLOCK;  Surgeon: Karie Soda, MD;  Location: [facility];  Service: General;  Laterality: N/A;    Current Outpatient Medications on File Prior to Visit  Medication Sig Dispense  Refill   atorvastatin (LIPITOR) 10 MG tablet TAKE 1 TABLET BY MOUTH DAILY 90 tablet 1   b complex vitamins tablet Take 1 tablet by mouth daily. 100 tablet 3   CALCIUM PO Take 1 tablet by mouth daily.     Cholecalciferol (VITAMIN D-3) 1000 UNITS CAPS Take 1,000 Units by mouth daily.     Cyanocobalamin (CVS VITAMIN B12 PO) Take 1 tablet by mouth daily.     losartan (COZAAR) 100 MG tablet TAKE 1 TABLET BY MOUTH DAILY 90 tablet 1   montelukast (SINGULAIR) 10 MG tablet TAKE 1 TABLET BY MOUTH EVERY DAY 90 tablet 1   Multiple Vitamins-Minerals (CENTRUM ADULTS) TABS Take 1 tablet by mouth daily.     naproxen sodium (ANAPROX) 220 MG tablet Take 220 mg by mouth as needed (headache or pain).     Omega-3 Fatty Acids (FISH OIL PO) Take 1 capsule by mouth daily.     pantoprazole (PROTONIX) 40 MG tablet TAKE 1 TABLET(40 MG) BY MOUTH DAILY 90 tablet 2   triamcinolone ointment (KENALOG) 0.5 % apply to affected area twice a day (Patient taking differently: Apply 1 application  topically once as needed (itching).) 60 g 3   triamterene-hydrochlorothiazide (MAXZIDE-25) 37.5-25 MG tablet Take 1 tablet by mouth daily. 30 tablet 11   No current facility-administered medications on file prior to visit.    Social History   Socioeconomic History   Marital status: Married    Spouse name: Not on file   Number of children: 1   Years of education: Not on file   Highest education level: Not on file  Occupational History   Occupation: retired  Tobacco Use   Smoking status: Never   Smokeless tobacco: Never  Vaping Use   Vaping status: Never Used  Substance and Sexual Activity   Alcohol use: Not Currently   Drug use: No   Sexual activity: Not Currently    Partners: Male    Birth control/protection: Surgical    Comment: 1st intercourse 18, yo-5 partners, hysterectomy  Other Topics Concern   Not on file  Social History Narrative   Not on file   Social Determinants of Health   Financial Resource Strain: Low  Risk  (09/06/2023)   Overall Financial Resource Strain (CARDIA)    Difficulty of Paying Living Expenses: Not hard at all  Food Insecurity: No Food Insecurity (09/06/2023)   Hunger Vital Sign    Worried About Running Out of Food in the Last Year: Never true    Ran Out of Food in  the Last Year: Never true  Transportation Needs: No Transportation Needs (09/06/2023)   PRAPARE - Administrator, Civil Service (Medical): No    Lack of Transportation (Non-Medical): No  Physical Activity: Sufficiently Active (09/06/2023)   Exercise Vital Sign    Days of Exercise per Week: 5 days    Minutes of Exercise per Session: 30 min  Stress: No Stress Concern Present (09/06/2023)   [employer] of Occupational Health - Occupational Stress Questionnaire    Feeling of Stress : Not at all  Social Connections: Socially Integrated (09/06/2023)   Social Connection and Isolation Panel [NHANES]    Frequency of Communication with Friends and Family: More than three times a week    Frequency of Social Gatherings with Friends and Family: Once a week    Attends Religious Services: More than 4 times per year    Active Member of [employer] or Organizations: Yes    Attends Engineer, structural: More than 4 times per year    Marital Status: Married  Catering manager Violence: Not At Risk (09/06/2023)   Humiliation, Afraid, Rape, and Kick questionnaire    Fear of Current or Ex-Partner: No    Emotionally Abused: No    Physically Abused: No    Sexually Abused: No    Family History  Problem Relation Age of Onset   Heart disease Mother 34   Cancer Mother 54       neck ca    Stroke Father 91   Other Brother        train accident   Other Brother        respiratory   Other Brother        liver failure ?   Breast cancer Maternal Grandmother        untreated   Other Daughter        suicide     Allergies  Allergen Reactions   Latex Itching   Lisinopril Cough   Phentermine Other (See  Comments)    Raynauld's ? Tachycardia   Iodinated Contrast Media Itching    Ears itch when having contrast.  Will need pre-meds for CT scans with contrast per Radiologist      Patient's last menstrual period was No LMP recorded. Patient has had a hysterectomy..           Review of Systems Alls systems reviewed and are negative.     Physical Exam Constitutional:      Appearance: Normal appearance.  Genitourinary:     Vulva and urethral meatus normal.     No lesions in the vagina.     Genitourinary Comments: Stage 3 cervical prolpase     Right Labia: No rash, lesions or skin changes.    Left Labia: No lesions, skin changes or rash.    No vaginal discharge or tenderness.     No vaginal prolapse present.    No vaginal atrophy present.     Right Adnexa: not tender and no mass present.    Left Adnexa: not tender and no mass present.    No cervical motion tenderness or discharge.     Uterus is absent.  Breasts:    Right: Normal.     Left: Normal.  HENT:     Head: Normocephalic.  Neck:     Thyroid: No thyroid mass, thyromegaly or thyroid tenderness.  Cardiovascular:     Rate and Rhythm: Normal rate and regular rhythm.     Heart sounds: Normal heart  sounds, S1 normal and S2 normal.  Pulmonary:     Effort: Pulmonary effort is normal.     Breath sounds: Normal breath sounds and air entry.  Abdominal:     General: There is no distension.     Palpations: Abdomen is soft. There is no mass.     Tenderness: There is no abdominal tenderness. There is no guarding or rebound.    Musculoskeletal:        General: Normal range of motion.     Cervical back: Full passive range of motion without pain, normal range of motion and neck supple. No tenderness.     Right lower leg: No edema.     Left lower leg: No edema.  Neurological:     Mental Status: She is alert.  Skin:    General: Skin is warm.  Psychiatric:        Mood and Affect: Mood normal.        Behavior: Behavior normal.         Thought Content: Thought content normal.  Vitals and nursing note reviewed. Exam conducted with a chaperone present.       A:         Well Woman GYN exam                             P:        Pap smear not indicated Encouraged annual mammogram screening Colon cancer screening up-to-date DXA up-to-date Labs and immunizations to do with PMD Encouraged healthy lifestyle practices Encouraged Vit D and Calcium   No follow-ups on file.  Earley Favor

## 2023-11-09 ENCOUNTER — Other Ambulatory Visit: Payer: Self-pay | Admitting: Internal Medicine

## 2023-11-20 ENCOUNTER — Other Ambulatory Visit (INDEPENDENT_AMBULATORY_CARE_PROVIDER_SITE_OTHER): Payer: Medicare Other

## 2023-11-20 DIAGNOSIS — I10 Essential (primary) hypertension: Secondary | ICD-10-CM | POA: Diagnosis not present

## 2023-11-20 DIAGNOSIS — R739 Hyperglycemia, unspecified: Secondary | ICD-10-CM | POA: Diagnosis not present

## 2023-11-20 LAB — COMPREHENSIVE METABOLIC PANEL
ALT: 26 U/L (ref 0–35)
AST: 18 U/L (ref 0–37)
Albumin: 4.3 g/dL (ref 3.5–5.2)
Alkaline Phosphatase: 68 U/L (ref 39–117)
BUN: 21 mg/dL (ref 6–23)
CO2: 28 meq/L (ref 19–32)
Calcium: 10 mg/dL (ref 8.4–10.5)
Chloride: 100 meq/L (ref 96–112)
Creatinine, Ser: 1.06 mg/dL (ref 0.40–1.20)
GFR: 52.77 mL/min — ABNORMAL LOW (ref >60.00)
Glucose, Bld: 101 mg/dL — ABNORMAL HIGH (ref 70–99)
Potassium: 4.2 meq/L (ref 3.5–5.1)
Sodium: 135 meq/L (ref 135–145)
Total Bilirubin: 0.3 mg/dL (ref 0.2–1.2)
Total Protein: 7.6 g/dL (ref 6.0–8.3)

## 2023-11-20 LAB — HEMOGLOBIN A1C: Hgb A1c MFr Bld: 7 % — ABNORMAL HIGH (ref 4.6–6.5)

## 2024-01-09 ENCOUNTER — Ambulatory Visit (INDEPENDENT_AMBULATORY_CARE_PROVIDER_SITE_OTHER): Payer: Medicare Other | Admitting: Internal Medicine

## 2024-01-09 ENCOUNTER — Encounter: Payer: Self-pay | Admitting: Internal Medicine

## 2024-01-09 VITALS — BP 120/80 | HR 96 | Temp 98.3°F | Ht 59.5 in | Wt 193.0 lb

## 2024-01-09 DIAGNOSIS — F4321 Adjustment disorder with depressed mood: Secondary | ICD-10-CM | POA: Diagnosis not present

## 2024-01-09 DIAGNOSIS — R635 Abnormal weight gain: Secondary | ICD-10-CM | POA: Diagnosis not present

## 2024-01-09 DIAGNOSIS — R600 Localized edema: Secondary | ICD-10-CM | POA: Diagnosis not present

## 2024-01-09 DIAGNOSIS — R739 Hyperglycemia, unspecified: Secondary | ICD-10-CM

## 2024-01-09 DIAGNOSIS — N182 Chronic kidney disease, stage 2 (mild): Secondary | ICD-10-CM | POA: Diagnosis not present

## 2024-01-09 LAB — COMPREHENSIVE METABOLIC PANEL
ALT: 20 U/L (ref 0–35)
AST: 17 U/L (ref 0–37)
Albumin: 4.3 g/dL (ref 3.5–5.2)
Alkaline Phosphatase: 64 U/L (ref 39–117)
BUN: 20 mg/dL (ref 6–23)
CO2: 30 meq/L (ref 19–32)
Calcium: 10.1 mg/dL (ref 8.4–10.5)
Chloride: 101 meq/L (ref 96–112)
Creatinine, Ser: 1.13 mg/dL (ref 0.40–1.20)
GFR: 48.83 mL/min — ABNORMAL LOW (ref >60.00)
Glucose, Bld: 127 mg/dL — ABNORMAL HIGH (ref 70–99)
Potassium: 3.7 meq/L (ref 3.5–5.1)
Sodium: 138 meq/L (ref 135–145)
Total Bilirubin: 0.5 mg/dL (ref 0.2–1.2)
Total Protein: 7.7 g/dL (ref 6.0–8.3)

## 2024-01-09 LAB — CBC WITH DIFFERENTIAL/PLATELET
Basophils Absolute: 0.1 10*3/uL (ref 0.0–0.1)
Basophils Relative: 0.8 % (ref 0.0–3.0)
Eosinophils Absolute: 0.1 10*3/uL (ref 0.0–0.7)
Eosinophils Relative: 1.1 % (ref 0.0–5.0)
HCT: 36.1 % (ref 36.0–46.0)
Hemoglobin: 12 g/dL (ref 12.0–15.0)
Lymphocytes Relative: 24.2 % (ref 12.0–46.0)
Lymphs Abs: 3.1 10*3/uL (ref 0.7–4.0)
MCHC: 33.2 g/dL (ref 30.0–36.0)
MCV: 87.2 fl (ref 78.0–100.0)
Monocytes Absolute: 1.1 10*3/uL — ABNORMAL HIGH (ref 0.1–1.0)
Monocytes Relative: 8.4 % (ref 3.0–12.0)
Neutro Abs: 8.5 10*3/uL — ABNORMAL HIGH (ref 1.4–7.7)
Neutrophils Relative %: 65.5 % (ref 43.0–77.0)
Platelets: 351 10*3/uL (ref 150.0–400.0)
RBC: 4.14 Mil/uL (ref 3.87–5.11)
RDW: 15.2 % (ref 11.5–15.5)
WBC: 13 10*3/uL — ABNORMAL HIGH (ref 4.0–10.5)

## 2024-01-09 LAB — URINALYSIS
Bilirubin Urine: NEGATIVE
Hgb urine dipstick: NEGATIVE
Ketones, ur: NEGATIVE
Leukocytes,Ua: NEGATIVE
Nitrite: NEGATIVE
Specific Gravity, Urine: 1.01 (ref 1.000–1.030)
Total Protein, Urine: NEGATIVE
Urine Glucose: NEGATIVE
Urobilinogen, UA: 0.2 (ref 0.0–1.0)
pH: 7 (ref 5.0–8.0)

## 2024-01-09 LAB — T4, FREE: Free T4: 1.08 ng/dL (ref 0.60–1.60)

## 2024-01-09 LAB — TSH: TSH: 0.5 u[IU]/mL (ref 0.35–5.50)

## 2024-01-09 LAB — HEMOGLOBIN A1C: Hgb A1c MFr Bld: 6.7 % — ABNORMAL HIGH (ref 4.6–6.5)

## 2024-01-09 MED ORDER — FUROSEMIDE 20 MG PO TABS: 20.0000 mg | ORAL_TABLET | Freq: Every day | ORAL | 3 refills | Status: DC | PRN

## 2024-01-09 NOTE — Assessment & Plan Note (Addendum)
 2022, 2025 L arm edema Use a sleeve. Loose weight

## 2024-01-09 NOTE — Assessment & Plan Note (Signed)
Hydrate well ?Monitor GFR ?

## 2024-01-09 NOTE — Patient Instructions (Signed)
 Athletic sleeve, gloves

## 2024-01-09 NOTE — Assessment & Plan Note (Signed)
 Check labs

## 2024-01-09 NOTE — Progress Notes (Signed)
 Subjective:  Patient ID: Regina Roberts, female    DOB: 1950-12-28  Age: 73 y.o. MRN: [id number]  CC: Medical Management of Chronic Issues (6 mnth f/u, Swelling in left arm off and on )   HPI [name] presents for L arm intermittent swelling x 4- 5 months on and off, intermittent. Pt had this issue in 2022 - LUE ven Doppler was (-) C/o wt gain  Outpatient Medications Prior to Visit  Medication Sig Dispense Refill   atorvastatin (LIPITOR) 10 MG tablet TAKE 1 TABLET BY MOUTH DAILY 90 tablet 1   b complex vitamins tablet Take 1 tablet by mouth daily. 100 tablet 3   CALCIUM PO Take 1 tablet by mouth daily.     Cholecalciferol (VITAMIN D-3) 1000 UNITS CAPS Take 1,000 Units by mouth daily.     Cyanocobalamin (CVS VITAMIN B12 PO) Take 1 tablet by mouth daily.     losartan (COZAAR) 100 MG tablet TAKE 1 TABLET BY MOUTH DAILY 90 tablet 1   montelukast (SINGULAIR) 10 MG tablet TAKE 1 TABLET BY MOUTH EVERY DAY 90 tablet 1   Multiple Vitamins-Minerals (CENTRUM ADULTS) TABS Take 1 tablet by mouth daily.     naproxen sodium (ANAPROX) 220 MG tablet Take 220 mg by mouth as needed (headache or pain).     Omega-3 Fatty Acids (FISH OIL PO) Take 1 capsule by mouth daily.     pantoprazole (PROTONIX) 40 MG tablet TAKE 1 TABLET(40 MG) BY MOUTH DAILY 90 tablet 2   triamcinolone ointment (KENALOG) 0.5 % apply to affected area twice a day (Patient taking differently: Apply 1 application  topically once as needed (itching).) 60 g 3   triamterene-hydrochlorothiazide (MAXZIDE-25) 37.5-25 MG tablet Take 1 tablet by mouth daily. 30 tablet 11   hydrochlorothiazide (HYDRODIURIL) 12.5 MG tablet TAKE 1 TABLET BY MOUTH DAILY 90 tablet 1   No facility-administered medications prior to visit.    ROS: Review of Systems  Constitutional:  Positive for fatigue and unexpected weight change. Negative for activity change, appetite change and chills.  HENT:  Negative for congestion, mouth sores and sinus  pressure.   Eyes:  Negative for visual disturbance.  Respiratory:  Negative for cough and chest tightness.   Cardiovascular:  Negative for leg swelling.  Gastrointestinal:  Negative for abdominal pain and nausea.  Genitourinary:  Negative for difficulty urinating, frequency and vaginal pain.  Musculoskeletal:  Negative for back pain and gait problem.  Skin:  Negative for pallor and rash.  Neurological:  Negative for dizziness, tremors, weakness, numbness and headaches.  Psychiatric/Behavioral:  Negative for confusion and sleep disturbance.     Objective:  BP 120/80   Pulse 96   Temp 98.3 F (36.8 C) (Oral)   Ht 4' 11.5" (1.511 m)   Wt 193 lb (87.5 kg)   SpO2 96%   BMI 38.33 kg/m   BP Readings from Last 3 Encounters:  01/09/24 120/80  10/02/23 118/76  07/12/23 120/70    Wt Readings from Last 3 Encounters:  01/09/24 193 lb (87.5 kg)  10/02/23 187 lb (84.8 kg)  09/06/23 184 lb (83.5 kg)    Physical Exam Constitutional:      General: She is not in acute distress.    Appearance: She is well-developed.  HENT:     Head: Normocephalic.     Right Ear: External ear normal.     Left Ear: External ear normal.     Nose: Nose normal.  Eyes:     General:  Right eye: No discharge.        Left eye: No discharge.     Conjunctiva/sclera: Conjunctivae normal.     Pupils: Pupils are equal, round, and reactive to light.  Neck:     Thyroid: No thyromegaly.     Vascular: No JVD.     Trachea: No tracheal deviation.  Cardiovascular:     Rate and Rhythm: Normal rate and regular rhythm.     Heart sounds: Normal heart sounds.  Pulmonary:     Effort: No respiratory distress.     Breath sounds: No stridor. No wheezing.  Abdominal:     General: Bowel sounds are normal. There is no distension.     Palpations: Abdomen is soft. There is no mass.     Tenderness: There is no abdominal tenderness. There is no guarding or rebound.  Musculoskeletal:        General: No tenderness.      Cervical back: Normal range of motion and neck supple. No rigidity.  Lymphadenopathy:     Cervical: No cervical adenopathy.  Skin:    Findings: No erythema or rash.  Neurological:     Cranial Nerves: No cranial nerve deficit.     Motor: No abnormal muscle tone.     Coordination: Coordination normal.     Deep Tendon Reflexes: Reflexes normal.  Psychiatric:        Behavior: Behavior normal.        Thought Content: Thought content normal.        Judgment: Judgment normal.     Lab Results  Component Value Date   WBC 12.2 (H) 03/28/2023   HGB 11.0 (L) 03/28/2023   HCT 35.0 (L) 03/28/2023   PLT 414.0 (H) 03/28/2023   GLUCOSE 101 (H) 11/20/2023   CHOL 159 03/08/2021   TRIG 143 04/14/2022   HDL 38.40 (L) 03/08/2021   LDLCALC 94 03/08/2021   ALT 26 11/20/2023   AST 18 11/20/2023   NA 135 11/20/2023   K 4.2 11/20/2023   CL 100 11/20/2023   CREATININE 1.06 11/20/2023   BUN 21 11/20/2023   CO2 28 11/20/2023   TSH 0.69 09/20/2022   INR 1.2 04/13/2022   HGBA1C 7.0 (H) 11/20/2023    MM 3D SCREENING MAMMOGRAM BILATERAL BREAST Result Date: 09/18/2023 CLINICAL DATA:  Screening. EXAM: DIGITAL SCREENING BILATERAL MAMMOGRAM WITH TOMOSYNTHESIS AND CAD TECHNIQUE: Bilateral screening digital craniocaudal and mediolateral oblique mammograms were obtained. Bilateral screening digital breast tomosynthesis was performed. The images were evaluated with computer-aided detection. COMPARISON:  Previous exam(s). ACR Breast Density Category b: There are scattered areas of fibroglandular density. FINDINGS: There are no findings suspicious for malignancy. IMPRESSION: No mammographic evidence of malignancy. A result letter of this screening mammogram will be mailed directly to the patient. RECOMMENDATION: Screening mammogram in one year. (Code:SM-B-01Y) BI-RADS CATEGORY  1: Negative. Electronically Signed   By: Elberta Fortis M.D.   On: 09/18/2023 08:49    Assessment & Plan:   Problem List Items Addressed  This Visit     Edema - Primary   2022, 2025 L arm edema Use a sleeve. Loose weight      Relevant Orders   Comprehensive metabolic panel   CBC with Differential/Platelet   Hemoglobin A1c   Urinalysis   TSH   T4, free   Hyperglycemia   Relevant Orders   Hemoglobin A1c   CKD (chronic kidney disease) stage 2, GFR 60-89 ml/min   Hydrate well Monitor GFR      Grief  Bobby died in June 2024 Doing better      Weight gain   Check labs      Relevant Orders   Comprehensive metabolic panel   CBC with Differential/Platelet   Hemoglobin A1c   Urinalysis   TSH   T4, free      Meds ordered this encounter  Medications   furosemide (LASIX) 20 MG tablet    Sig: Take 1-2 tablets (20-40 mg total) by mouth daily as needed for edema.    Dispense:  60 tablet    Refill:  3      Follow-up: No follow-ups on file.  Sonda Primes, MD

## 2024-01-09 NOTE — Assessment & Plan Note (Signed)
 Bobby died in June 2024 Doing better

## 2024-02-11 ENCOUNTER — Other Ambulatory Visit: Payer: Self-pay | Admitting: Internal Medicine

## 2024-04-10 ENCOUNTER — Encounter: Payer: Self-pay | Admitting: Internal Medicine

## 2024-04-10 ENCOUNTER — Ambulatory Visit (INDEPENDENT_AMBULATORY_CARE_PROVIDER_SITE_OTHER): Admitting: Internal Medicine

## 2024-04-10 VITALS — BP 132/84 | HR 83 | Temp 98.7°F | Ht 59.5 in | Wt 187.0 lb

## 2024-04-10 DIAGNOSIS — I10 Essential (primary) hypertension: Secondary | ICD-10-CM

## 2024-04-10 DIAGNOSIS — N182 Chronic kidney disease, stage 2 (mild): Secondary | ICD-10-CM

## 2024-04-10 DIAGNOSIS — M5440 Lumbago with sciatica, unspecified side: Secondary | ICD-10-CM | POA: Diagnosis not present

## 2024-04-10 DIAGNOSIS — F4321 Adjustment disorder with depressed mood: Secondary | ICD-10-CM | POA: Diagnosis not present

## 2024-04-10 DIAGNOSIS — G8929 Other chronic pain: Secondary | ICD-10-CM | POA: Diagnosis not present

## 2024-04-10 NOTE — Assessment & Plan Note (Signed)
Hydrate well ?Monitor GFR ?

## 2024-04-10 NOTE — Assessment & Plan Note (Addendum)
 Occ LBP - doing well Blue-Emu cream was recommended to use 2-3 times a day

## 2024-04-10 NOTE — Assessment & Plan Note (Signed)
New low potassium of 3.3.  It is likely related to the use of diuretic (hydrochlorothiazide).  I will send a prescription for potassium for her to take for 1 month.  She can stop taking hydrochlorothiazide.

## 2024-04-10 NOTE — Assessment & Plan Note (Signed)
 Bobby died in June 2024. Coping ok

## 2024-04-10 NOTE — Progress Notes (Signed)
 Subjective:  Patient ID: Regina Roberts, female    DOB: [birth year]  Age: 73 y.o. MRN: [id number]  CC: Medical Management of Chronic Issues (3 month F/U no concerns)   HPI [facility] presents for HTN, GERD, edema  Bobby died in 04-27-23. Retired Son in [place] 2 gds - 13,11  Outpatient Medications Prior to Visit  Medication Sig Dispense Refill   atorvastatin  (LIPITOR) 10 MG tablet TAKE 1 TABLET BY MOUTH DAILY 90 tablet 1   b complex vitamins tablet Take 1 tablet by mouth daily. 100 tablet 3   CALCIUM  PO Take 1 tablet by mouth daily.     Cholecalciferol  (VITAMIN D -3) 1000 UNITS CAPS Take 1,000 Units by mouth daily.     Cyanocobalamin  (CVS VITAMIN B12 PO) Take 1 tablet by mouth daily.     furosemide  (LASIX ) 20 MG tablet Take 1-2 tablets (20-40 mg total) by mouth daily as needed for edema. 60 tablet 3   losartan  (COZAAR ) 100 MG tablet TAKE 1 TABLET BY MOUTH DAILY 90 tablet 1   montelukast  (SINGULAIR ) 10 MG tablet TAKE 1 TABLET BY MOUTH EVERY DAY 90 tablet 1   Multiple Vitamins-Minerals (CENTRUM ADULTS) TABS Take 1 tablet by mouth daily.     naproxen sodium (ANAPROX) 220 MG tablet Take 220 mg by mouth as needed (headache or pain).     Omega-3 Fatty Acids (FISH OIL PO) Take 1 capsule by mouth daily.     pantoprazole  (PROTONIX ) 40 MG tablet TAKE 1 TABLET(40 MG) BY MOUTH DAILY 90 tablet 3   triamcinolone  ointment (KENALOG ) 0.5 % apply to affected area twice a day (Patient taking differently: Apply 1 application  topically once as needed (itching).) 60 g 3   triamterene -hydrochlorothiazide  (MAXZIDE-25) 37.5-25 MG tablet Take 1 tablet by mouth daily. 30 tablet 11   No facility-administered medications prior to visit.    ROS: Review of Systems  Constitutional:  Negative for activity change, appetite change, chills, fatigue and unexpected weight change.  HENT:  Negative for congestion, mouth sores and sinus pressure.   Eyes:  Negative for visual disturbance.   Respiratory:  Negative for cough and chest tightness.   Gastrointestinal:  Negative for abdominal pain and nausea.  Genitourinary:  Negative for difficulty urinating, frequency and vaginal pain.  Musculoskeletal:  Negative for back pain and gait problem.  Skin:  Negative for pallor and rash.  Neurological:  Negative for dizziness, tremors, weakness, numbness and headaches.  Psychiatric/Behavioral:  Negative for confusion, sleep disturbance and suicidal ideas.     Objective:  BP 132/84   Pulse 83   Temp 98.7 F (37.1 C) (Oral)   Ht 4' 11.5 (1.511 m)   Wt 187 lb (84.8 kg)   SpO2 97%   BMI 37.14 kg/m   BP Readings from Last 3 Encounters:  04/10/24 132/84  01/09/24 120/80  10/02/23 118/76    Wt Readings from Last 3 Encounters:  04/10/24 187 lb (84.8 kg)  01/09/24 193 lb (87.5 kg)  10/02/23 187 lb (84.8 kg)    Physical Exam Constitutional:      General: She is not in acute distress.    Appearance: She is well-developed. She is obese.  HENT:     Head: Normocephalic.     Right Ear: External ear normal.     Left Ear: External ear normal.     Nose: Nose normal.   Eyes:     General:        Right eye: No discharge.  Left eye: No discharge.     Conjunctiva/sclera: Conjunctivae normal.     Pupils: Pupils are equal, round, and reactive to light.   Neck:     Thyroid : No thyromegaly.     Vascular: No JVD.     Trachea: No tracheal deviation.   Cardiovascular:     Rate and Rhythm: Normal rate and regular rhythm.     Heart sounds: Normal heart sounds.  Pulmonary:     Effort: No respiratory distress.     Breath sounds: No stridor. No wheezing.  Abdominal:     General: Bowel sounds are normal. There is no distension.     Palpations: Abdomen is soft. There is no mass.     Tenderness: There is no abdominal tenderness. There is no guarding or rebound.   Musculoskeletal:        General: No tenderness.     Cervical back: Normal range of motion and neck supple. No  rigidity.  Lymphadenopathy:     Cervical: No cervical adenopathy.   Skin:    Findings: No erythema or rash.   Neurological:     Cranial Nerves: No cranial nerve deficit.     Motor: No abnormal muscle tone.     Coordination: Coordination normal.     Deep Tendon Reflexes: Reflexes normal.   Psychiatric:        Behavior: Behavior normal.        Thought Content: Thought content normal.        Judgment: Judgment normal.     Lab Results  Component Value Date   WBC 13.0 (H) 01/09/2024   HGB 12.0 01/09/2024   HCT 36.1 01/09/2024   PLT 351.0 01/09/2024   GLUCOSE 127 (H) 01/09/2024   CHOL 159 03/08/2021   TRIG 143 04/14/2022   HDL 38.40 (L) 03/08/2021   LDLCALC 94 03/08/2021   ALT 20 01/09/2024   AST 17 01/09/2024   NA 138 01/09/2024   K 3.7 01/09/2024   CL 101 01/09/2024   CREATININE 1.13 01/09/2024   BUN 20 01/09/2024   CO2 30 01/09/2024   TSH 0.50 01/09/2024   INR 1.2 04/13/2022   HGBA1C 6.7 (H) 01/09/2024    MM 3D SCREENING MAMMOGRAM BILATERAL BREAST Result Date: 09/18/2023 CLINICAL DATA:  Screening. EXAM: DIGITAL SCREENING BILATERAL MAMMOGRAM WITH TOMOSYNTHESIS AND CAD TECHNIQUE: Bilateral screening digital craniocaudal and mediolateral oblique mammograms were obtained. Bilateral screening digital breast tomosynthesis was performed. The images were evaluated with computer-aided detection. COMPARISON:  Previous exam(s). ACR Breast Density Category b: There are scattered areas of fibroglandular density. FINDINGS: There are no findings suspicious for malignancy. IMPRESSION: No mammographic evidence of malignancy. A result letter of this screening mammogram will be mailed directly to the patient. RECOMMENDATION: Screening mammogram in one year. (Code:SM-B-01Y) BI-RADS CATEGORY  1: Negative. Electronically Signed   By: Roda Cirri M.D.   On: 09/18/2023 08:49    Assessment & Plan:   Problem List Items Addressed This Visit     LOW BACK PAIN   Occ LBP - doing well Blue-Emu  cream was recommended to use 2-3 times a day        Essential hypertension - Primary   New low potassium of 3.3.  It is likely related to the use of diuretic (hydrochlorothiazide ).  I will send a prescription for potassium for her to take for 1 month.  She can stop taking hydrochlorothiazide .      CKD (chronic kidney disease) stage 2, GFR 60-89 ml/min   Hydrate well  Monitor GFR      Grief   Bobby died in 04/16/23. Coping ok          No orders of the defined types were placed in this encounter.     Follow-up: Return in about 6 months (around 10/10/2024) for Wellness Exam.  Anitra Barn, MD

## 2024-05-17 ENCOUNTER — Other Ambulatory Visit: Payer: Self-pay | Admitting: Internal Medicine

## 2024-08-06 ENCOUNTER — Other Ambulatory Visit: Payer: Self-pay | Admitting: Obstetrics and Gynecology

## 2024-08-06 DIAGNOSIS — Z1231 Encounter for screening mammogram for malignant neoplasm of breast: Secondary | ICD-10-CM

## 2024-08-23 ENCOUNTER — Encounter: Payer: Self-pay | Admitting: Pharmacist

## 2024-08-23 NOTE — Progress Notes (Signed)
 Pharmacy Quality Measure Review  This patient is appearing on a report for being at risk of failing the adherence measure for cholesterol (statin) medications this calendar year.   Medication: Atiorvastatin Last fill date: 08/18/24 for 90 day supply  Insurance report was not up to date. No action needed at this time.   Darrelyn Drum, PharmD, BCPS, CPP Clinical Pharmacist Practitioner [facility] [phone]

## 2024-08-26 ENCOUNTER — Other Ambulatory Visit: Payer: Self-pay | Admitting: Internal Medicine

## 2024-09-11 ENCOUNTER — Ambulatory Visit: Payer: Medicare Other

## 2024-09-11 VITALS — Ht 59.0 in | Wt 187.0 lb

## 2024-09-11 DIAGNOSIS — Z0000 Encounter for general adult medical examination without abnormal findings: Secondary | ICD-10-CM

## 2024-09-11 DIAGNOSIS — Z1159 Encounter for screening for other viral diseases: Secondary | ICD-10-CM

## 2024-09-11 NOTE — Progress Notes (Addendum)
 Chief Complaint  Patient presents with   Medicare Wellness     Subjective:   Regina Roberts is a 73 y.o. female who presents for a Medicare Annual Wellness Visit.  I connected with  Regina Roberts on 09/11/24 by a audio enabled telemedicine application and verified that I am speaking with the correct person using two identifiers.  Patient Location: Home  Provider Location: Office/Clinic  Persons Participating in Visit: Patient.  I discussed the limitations of evaluation and management by telemedicine. The patient expressed understanding and agreed to proceed.  Vital Signs: Because this visit was a virtual/telehealth visit, some criteria may be missing or patient reported. Any vitals not documented were not able to be obtained and vitals that have been documented are patient reported.  Allergies (verified) Latex, Lisinopril , Phentermine , and Iodinated contrast media   History: Past Medical History:  Diagnosis Date   Allergy    sesonal   Blood transfusion without reported diagnosis 1984   Diverticulitis of colon with perforation 02/28/2022   GERD (gastroesophageal reflux disease)    Hyperlipidemia    Hypertension    Osteopenia 10/2018   T score -1.8 FRAX 3.4% / 0.3% able from prior DEXA   Past Surgical History:  Procedure Laterality Date   HERNIA REPAIR  2000   IR RADIOLOGIST EVAL & MGMT  05/03/2022   LAPAROTOMY N/A 03/20/2021   Procedure: LAPAROTOMY;  Surgeon: Signe Mitzie LABOR, MD;  Location: [facility];  Service: General;  Laterality: N/A;  180   LYSIS OF ADHESION N/A 04/01/2022   Procedure: LYSIS OF ADHESION;  Surgeon: Sheldon Standing, MD;  Location: [facility];  Service: General;  Laterality: N/A;   PARTIAL COLECTOMY N/A 03/20/2021   Procedure: PARTIAL COLECTOMY WITH COLOSTOMY;  Surgeon: Signe Mitzie LABOR, MD;  Location: [facility];  Service: General;  Laterality: N/A;   PELVIC LAPAROSCOPY  1996   PROCTOSCOPY N/A 04/01/2022   Procedure: RIGID PROCTOSCOPY;  Surgeon:  Sheldon Standing, MD;  Location: [facility];  Service: General;  Laterality: N/A;   SMALL BOWEL REPAIR  04/01/2022   Procedure: SMALL BOWEL REPAIR;  Surgeon: Sheldon Standing, MD;  Location: [facility];  Service: General;;   SUPRACERVICAL ABDOMINAL HYSTERECTOMY  2003   with RSO. Leiomyoma menorrhagia with 16-18 week size uterus   XI ROBOTIC ASSISTED COLOSTOMY TAKEDOWN N/A 04/01/2022   Procedure: ROBOTIC OSTOMY TAKEDOWN, TAKEDOWN COLORECTAL STUMP FISTULA, VAGINAL CUFF REPAIR, AND BILATERAL TAP BLOCK;  Surgeon: Sheldon Standing, MD;  Location: [facility];  Service: General;  Laterality: N/A;   Family History  Problem Relation Age of Onset   Heart disease Mother 79   Cancer Mother 45       neck ca    Stroke Father 49   Other Brother        train accident   Other Brother        respiratory   Other Brother        liver failure ?   Breast cancer Maternal Grandmother        untreated   Other Daughter        suicide   Social History   Occupational History   Occupation: retired  Tobacco Use   Smoking status: Never   Smokeless tobacco: Never  Vaping Use   Vaping status: Never Used  Substance and Sexual Activity   Alcohol use: Not Currently   Drug use: No   Sexual activity: Not Currently    Partners: Male    Birth control/protection: Surgical  Comment: 1st intercourse 51, yo-5 partners, hysterectomy   Tobacco Counseling Counseling given: Not Answered  SDOH Screenings   Food Insecurity: No Food Insecurity (09/11/2024)  Housing: Unknown (09/11/2024)  Transportation Needs: No Transportation Needs (09/11/2024)  Utilities: Not At Risk (09/11/2024)  Alcohol Screen: Low Risk  (03/16/2022)  Depression (PHQ2-9): Low Risk  (09/11/2024)  Financial Resource Strain: Low Risk  (09/06/2023)  Physical Activity: Sufficiently Active (09/11/2024)  Social Connections: Moderately Integrated (09/11/2024)  Stress: No Stress Concern Present (09/11/2024)  Tobacco Use: Low Risk  (09/11/2024)  Health Literacy: Adequate  Health Literacy (09/11/2024)   See flowsheets for full screening details  Depression Screen PHQ 2 & 9 Depression Scale- Over the past 2 weeks, how often have you been bothered by any of the following problems? Little interest or pleasure in doing things: 0 Feeling down, depressed, or hopeless (PHQ Adolescent also includes...irritable): 0 PHQ-2 Total Score: 0 Trouble falling or staying asleep, or sleeping too much: 0 Feeling tired or having little energy: 0 Poor appetite or overeating (PHQ Adolescent also includes...weight loss): 0 Feeling bad about yourself - or that you are a failure or have let yourself or your family down: 0 Trouble concentrating on things, such as reading the newspaper or watching television (PHQ Adolescent also includes...like school work): 0 Moving or speaking so slowly that other people could have noticed. Or the opposite - being so fidgety or restless that you have been moving around a lot more than usual: 0 Thoughts that you would be better off dead, or of hurting yourself in some way: 0 PHQ-9 Total Score: 0 If you checked off any problems, how difficult have these problems made it for you to do your work, take care of things at home, or get along with other people?: Not difficult at all  Depression Treatment Depression Interventions/Treatment : EYV7-0 Score <4 Follow-up Not Indicated     Goals Addressed               This Visit's Progress     Patient Stated (pt-stated)        Patient stated she wants to lose about 20-30lbs       Visit info / Clinical Intake: Medicare Wellness Visit Type:: Subsequent Annual Wellness Visit Persons participating in visit:: patient Medicare Wellness Visit Mode:: Telephone If telephone:: video declined Because this visit was a virtual/telehealth visit:: vitals recorded from last visit If Telephone or Video please confirm:: I connected with the patient using audio enabled telemedicine application and verified that I am  speaking with the correct person using two identifiers; I discussed the limitations of evaluation and management by telemedicine; The patient expressed understanding and agreed to proceed Patient Location:: Home Provider Location:: Office Information given by:: patient Interpreter Needed?: No Pre-visit prep was completed: yes AWV questionnaire completed by patient prior to visit?: no Living arrangements:: (!) lives alone Patient's Overall Health Status Rating: good Typical amount of pain: none Does pain affect daily life?: no Are you currently prescribed opioids?: no  Dietary Habits and Nutritional Risks How many meals a day?: 3 Eats fruit and vegetables daily?: yes Most meals are obtained by: preparing own meals; eating out In the last 2 weeks, have you had any of the following?: none Diabetic:: no  Functional Status Activities of Daily Living (to include ambulation/medication): Independent Ambulation: Independent with device- listed below Home Assistive Devices/Equipment: Eyeglasses (wears eyeglasses for reading) Medication Administration: Independent Home Management: Independent Manage your own finances?: yes Primary transportation is: driving Concerns about vision?: no *  vision screening is required for WTM* Concerns about hearing?: no  Fall Screening Falls in the past year?: 0 Number of falls in past year: 0 Was there an injury with Fall?: 0 Fall Risk Category Calculator: 0 Patient Fall Risk Level: Low Fall Risk  Fall Risk Patient at Risk for Falls Due to: No Fall Risks Fall risk Follow up: Falls evaluation completed; Falls prevention discussed  Home and Transportation Safety: All rugs have non-skid backing?: yes All stairs or steps have railings?: yes (outside) Grab bars in the bathtub or shower?: (!) no Have non-skid surface in bathtub or shower?: yes Good home lighting?: yes Regular seat belt use?: yes Hospital stays in the last year:: no  Cognitive  Assessment Difficulty concentrating, remembering, or making decisions? : no Will 6CIT or Mini Cog be Completed: yes What year is it?: 0 points What month is it?: 0 points Give patient an address phrase to remember (5 components): [address] [place], [place] About what time is it?: 0 points Count backwards from 20 to 1: 0 points Say the months of the year in reverse: 0 points Repeat the address phrase from earlier: 0 points 6 CIT Score: 0 points  Advance Directives (For Healthcare) Does Patient Have a Medical Advance Directive?: No Would patient like information on creating a medical advance directive?: No - Patient declined  Reviewed/Updated  Reviewed/Updated: Reviewed All (Medical, Surgical, Family, Medications, Allergies, Care Teams, Patient Goals)        Objective:    Today's Vitals   09/11/24 1132  Weight: 187 lb (84.8 kg)  Height: 4' 11 (1.499 m)   Body mass index is 37.77 kg/m.  Current Medications (verified) Outpatient Encounter Medications as of 09/11/2024  Medication Sig   atorvastatin  (LIPITOR) 10 MG tablet TAKE 1 TABLET BY MOUTH DAILY   b complex vitamins tablet Take 1 tablet by mouth daily.   CALCIUM  PO Take 1 tablet by mouth daily.   Cholecalciferol  (VITAMIN D -3) 1000 UNITS CAPS Take 1,000 Units by mouth daily.   Cyanocobalamin  ([employer] VITAMIN B12 PO) Take 1 tablet by mouth daily.   furosemide  (LASIX ) 20 MG tablet Take 1-2 tablets (20-40 mg total) by mouth daily as needed for edema.   losartan  (COZAAR ) 100 MG tablet TAKE 1 TABLET BY MOUTH DAILY   montelukast  (SINGULAIR ) 10 MG tablet TAKE 1 TABLET BY MOUTH EVERY DAY   Multiple Vitamins-Minerals (CENTRUM ADULTS) TABS Take 1 tablet by mouth daily.   naproxen sodium (ANAPROX) 220 MG tablet Take 220 mg by mouth as needed (headache or pain).   Omega-3 Fatty Acids (FISH OIL PO) Take 1 capsule by mouth daily.   pantoprazole  (PROTONIX ) 40 MG tablet TAKE 1 TABLET(40 MG) BY MOUTH DAILY   triamcinolone  ointment (KENALOG )  0.5 % apply to affected area twice a day (Patient taking differently: Apply 1 application  topically once as needed (itching).)   triamterene -hydrochlorothiazide  (MAXZIDE-25) 37.5-25 MG tablet Take 1 tablet by mouth daily.   No facility-administered encounter medications on file as of 09/11/2024.   Hearing/Vision screen Hearing Screening - Comments:: Denies hearing difficulties   Vision Screening - Comments:: Wears eyeglasses for reading- up to date with routine eye exams (appt w/[facility] in 09/2024) Immunizations and Health Maintenance Health Maintenance  Topic Date Due   Hepatitis C Screening  Never done   COVID-19 Vaccine (6 - Pfizer risk 2025-26 season) 02/06/2025   Medicare Annual Wellness (AWV)  09/11/2025   Mammogram  09/13/2025   DTaP/Tdap/Td (2 - Td or Tdap) 08/01/2026  Colonoscopy  08/04/2027   Pneumococcal Vaccine: 50+ Years  Completed   Influenza Vaccine  Completed   DEXA SCAN  Completed   Zoster Vaccines- Shingrix  Completed   Meningococcal B Vaccine  Aged Out        Assessment/Plan:  This is a routine wellness examination for [facility].  Patient Care Team: Plotnikov, Karlynn GAILS, MD as PCP - General Pyrtle, Gordy HERO, MD as Consulting Physician (Gastroenterology) Signe Mitzie LABOR, MD as Consulting Physician (General Surgery) Sheldon Standing, MD as Consulting Physician (Colon and Rectal Surgery) [facility] (Ophthalmology)  I have personally reviewed and noted the following in the patient's chart:   Medical and social history Use of alcohol, tobacco or illicit drugs  Current medications and supplements including opioid prescriptions. Functional ability and status Nutritional status Physical activity Advanced directives List of other physicians Hospitalizations, surgeries, and ER visits in previous 12 months Vitals Screenings to include cognitive, depression, and falls Referrals and appointments  Orders Placed This Encounter  Procedures    Hepatitis C antibody    Standing Status:   Future    Expiration Date:   09/11/2025   In addition, I have reviewed and discussed with patient certain preventive protocols, quality metrics, and best practice recommendations. A written personalized care plan for preventive services as well as general preventive health recommendations were provided to patient.   Verdie HERO Saba, CMA   09/11/2024   Return in 1 year (on 09/11/2025).  After Visit Summary: (Declined) Due to this being a telephonic visit, with patients personalized plan was offered to patient but patient Declined AVS at this time   Nurse Notes: Mammogram scheduled for 09/16/24.  Ordered a Hepatitis C Screening.  CPE w/PCP scheduled for 10/10/2024.  Medical screening examination/treatment/procedure(s) were performed by non-physician practitioner and as supervising physician I was immediately available for consultation/collaboration.  I agree with above. Karlynn Noel, MD

## 2024-09-11 NOTE — Patient Instructions (Addendum)
 Regina Roberts,  Thank you for taking the time for your Medicare Wellness Visit. I appreciate your continued commitment to your health goals. Please review the care plan we discussed, and feel free to reach out if I can assist you further.  Please note that Annual Wellness Visits do not include a physical exam. Some assessments may be limited, especially if the visit was conducted virtually. If needed, we may recommend an in-person follow-up with your provider.  Ongoing Care Seeing your primary care provider every 3 to 6 months helps us  monitor your health and provide consistent, personalized care.   Referrals If a referral was made during today's visit and you haven't received any updates within two weeks, please contact the referred provider directly to check on the status.  Recommended Screenings:  Health Maintenance  Topic Date Due   Hepatitis C Screening  Never done   COVID-19 Vaccine (6 - Pfizer risk 2025-26 season) 02/06/2025   Medicare Annual Wellness Visit  09/11/2025   Breast Cancer Screening  09/13/2025   DTaP/Tdap/Td vaccine (2 - Td or Tdap) 08/01/2026   Colon Cancer Screening  08/04/2027   Pneumococcal Vaccine for age over 7  Completed   Flu Shot  Completed   DEXA scan (bone density measurement)  Completed   Zoster (Shingles) Vaccine  Completed   Meningitis B Vaccine  Aged Out       09/11/2024   11:34 AM  Advanced Directives  Does Patient Have a Medical Advance Directive? No  Would patient like information on creating a medical advance directive? No - Patient declined    Vision: Annual vision screenings are recommended for early detection of glaucoma, cataracts, and diabetic retinopathy. These exams can also reveal signs of chronic conditions such as diabetes and high blood pressure.  Dental: Annual dental screenings help detect early signs of oral cancer, gum disease, and other conditions linked to overall health, including heart disease and diabetes.

## 2024-09-16 ENCOUNTER — Other Ambulatory Visit: Payer: Self-pay | Admitting: Internal Medicine

## 2024-09-16 ENCOUNTER — Ambulatory Visit
Admission: RE | Admit: 2024-09-16 | Discharge: 2024-09-16 | Disposition: A | Discharge status: Home / Self Care | Source: Ambulatory Visit | Attending: Obstetrics and Gynecology | Admitting: Obstetrics and Gynecology

## 2024-09-16 DIAGNOSIS — Z1231 Encounter for screening mammogram for malignant neoplasm of breast: Secondary | ICD-10-CM

## 2024-09-24 ENCOUNTER — Ambulatory Visit: Payer: Self-pay | Admitting: Obstetrics and Gynecology

## 2024-10-03 ENCOUNTER — Ambulatory Visit: Payer: Medicare Other | Admitting: Obstetrics and Gynecology

## 2024-10-03 ENCOUNTER — Encounter: Payer: Self-pay | Admitting: Obstetrics and Gynecology

## 2024-10-03 VITALS — BP 118/78 | HR 70 | Resp 16 | Ht 59.25 in | Wt 186.0 lb

## 2024-10-03 DIAGNOSIS — Z9189 Other specified personal risk factors, not elsewhere classified: Secondary | ICD-10-CM | POA: Diagnosis not present

## 2024-10-03 DIAGNOSIS — M8580 Other specified disorders of bone density and structure, unspecified site: Secondary | ICD-10-CM

## 2024-10-03 DIAGNOSIS — Z01419 Encounter for gynecological examination (general) (routine) without abnormal findings: Secondary | ICD-10-CM

## 2024-10-03 NOTE — Progress Notes (Signed)
 73 y.o. y.o. female here for annual exam. No LMP recorded. Patient has had a hysterectomy.    HPI: Cervical stump prolapse. Prior supracervical hysterectomy. Using ring pessary occasionally. Not bothered by prolapse.  Husband passed recently.  Son in [place].  Originally from [place]. Plans to stay in [place]. Postmenopause, well on no HRT.  No PMB.  No pelvic pain. Pap smear Neg 09/27/22. No significant history of abnormal Pap smears.  Pap reflex done today. Breasts normal.  Mammogram 08/2024 Neg.  Colonoscopy 2018 Neg. Osteopenia. DEXA 10/2018. T score -1.8 at [facility], FRAX 3.4% / 0.3%,, 2023 [facility] -2.0 normal frax.  Encourage weightbearing exercise, continue with frequent walking and continued calcium  and vitamin D  supplement.  BMI 36.25. Health labs with Fam MD.  Body mass index is 37.25 kg/m.   Height 4' 11.25 (1.505 m), weight 186 lb (84.4 kg).     Component Value Date/Time   DIAGPAP  09/27/2022 1520    - Negative for Intraepithelial Lesions or Malignancy (NILM)   DIAGPAP - Benign reactive/reparative changes 09/27/2022 1520   ADEQPAP  09/27/2022 1520    Satisfactory for evaluation; transformation zone component PRESENT.    GYN HISTORY:    Component Value Date/Time   DIAGPAP  09/27/2022 1520    - Negative for Intraepithelial Lesions or Malignancy (NILM)   DIAGPAP - Benign reactive/reparative changes 09/27/2022 1520   ADEQPAP  09/27/2022 1520    Satisfactory for evaluation; transformation zone component PRESENT.    OB History  Gravida Para Term Preterm AB Living  3 2 2  1 1   SAB IAB Ectopic Multiple Live Births  1    2    # Outcome Date GA Lbr Len/2nd Weight Sex Type Anes PTL Lv  3 Term           2 SAB           1 Term             Past Medical History:  Diagnosis Date   Allergy    sesonal   Blood transfusion without reported diagnosis 1984   Diverticulitis of colon with perforation 02/28/2022   GERD (gastroesophageal reflux disease)    Hyperlipidemia     Hypertension    Osteopenia 10/2018   T score -1.8 FRAX 3.4% / 0.3% able from prior DEXA    Past Surgical History:  Procedure Laterality Date   HERNIA REPAIR  2000   IR RADIOLOGIST EVAL & MGMT  05/03/2022   LAPAROTOMY N/A 03/20/2021   Procedure: LAPAROTOMY;  Surgeon: Signe Mitzie LABOR, MD;  Location: [facility];  Service: General;  Laterality: N/A;  180   LYSIS OF ADHESION N/A 04/01/2022   Procedure: LYSIS OF ADHESION;  Surgeon: Sheldon Standing, MD;  Location: [facility];  Service: General;  Laterality: N/A;   PARTIAL COLECTOMY N/A 03/20/2021   Procedure: PARTIAL COLECTOMY WITH COLOSTOMY;  Surgeon: Signe Mitzie LABOR, MD;  Location: [facility];  Service: General;  Laterality: N/A;   PELVIC LAPAROSCOPY  1996   PROCTOSCOPY N/A 04/01/2022   Procedure: RIGID PROCTOSCOPY;  Surgeon: Sheldon Standing, MD;  Location: [facility];  Service: General;  Laterality: N/A;   SMALL BOWEL REPAIR  04/01/2022   Procedure: SMALL BOWEL REPAIR;  Surgeon: Sheldon Standing, MD;  Location: [facility];  Service: General;;   SUPRACERVICAL ABDOMINAL HYSTERECTOMY  2003   with RSO. Leiomyoma menorrhagia with 16-18 week size uterus   XI ROBOTIC ASSISTED COLOSTOMY TAKEDOWN N/A 04/01/2022   Procedure: ROBOTIC OSTOMY  TAKEDOWN, TAKEDOWN COLORECTAL STUMP FISTULA, VAGINAL CUFF REPAIR, AND BILATERAL TAP BLOCK;  Surgeon: Sheldon Standing, MD;  Location: [facility];  Service: General;  Laterality: N/A;    Medications Ordered Prior to Encounter[1]  Social History   Socioeconomic History   Marital status: Widowed    Spouse name: Not on file   Number of children: 1   Years of education: Not on file   Highest education level: Not on file  Occupational History   Occupation: retired  Tobacco Use   Smoking status: Never   Smokeless tobacco: Never  Vaping Use   Vaping status: Never Used  Substance and Sexual Activity   Alcohol use: Not Currently   Drug use: No   Sexual activity: Not Currently    Partners: Male    Birth control/protection: Surgical    Comment:  1st intercourse 18, yo-5 partners, hysterectomy  Other Topics Concern   Not on file  Social History Narrative   Widowed   Social Drivers of Health   Tobacco Use: Low Risk (10/03/2024)   Patient History    Smoking Tobacco Use: Never    Smokeless Tobacco Use: Never    Passive Exposure: Not on file  Financial Resource Strain: Low Risk (09/06/2023)   Overall Financial Resource Strain (CARDIA)    Difficulty of Paying Living Expenses: Not hard at all  Food Insecurity: No Food Insecurity (09/11/2024)   Epic    Worried About Programme Researcher, Broadcasting/film/video in the Last Year: Never true    Ran Out of Food in the Last Year: Never true  Transportation Needs: No Transportation Needs (09/11/2024)   Epic    Lack of Transportation (Medical): No    Lack of Transportation (Non-Medical): No  Physical Activity: Sufficiently Active (09/11/2024)   Exercise Vital Sign    Days of Exercise per Week: 5 days    Minutes of Exercise per Session: 30 min  Stress: No Stress Concern Present (09/11/2024)   [employer] of Occupational Health - Occupational Stress Questionnaire    Feeling of Stress: Not at all  Social Connections: Moderately Integrated (09/11/2024)   Social Connection and Isolation Panel    Frequency of Communication with Friends and Family: More than three times a week    Frequency of Social Gatherings with Friends and Family: Once a week    Attends Religious Services: More than 4 times per year    Active Member of [employer] or Organizations: Yes    Attends Banker Meetings: More than 4 times per year    Marital Status: Widowed  Intimate Partner Violence: Not At Risk (09/11/2024)   Epic    Fear of Current or Ex-Partner: No    Emotionally Abused: No    Physically Abused: No    Sexually Abused: No  Depression (PHQ2-9): Low Risk (09/11/2024)   Depression (PHQ2-9)    PHQ-2 Score: 0  Alcohol Screen: Low Risk (03/16/2022)   Alcohol Screen    Last Alcohol Screening Score (AUDIT): 0   Housing: Unknown (09/11/2024)   Epic    Unable to Pay for Housing in the Last Year: No    Number of Times Moved in the Last Year: Not on file    Homeless in the Last Year: No  Utilities: Not At Risk (09/11/2024)   Epic    Threatened with loss of utilities: No  Health Literacy: Adequate Health Literacy (09/11/2024)   B1300 Health Literacy    Frequency of need for help with medical instructions: Never  Family History  Problem Relation Age of Onset   Heart disease Mother 31   Cancer Mother 76       neck ca    Stroke Father 29   Other Brother        train accident   Other Brother        respiratory   Other Brother        liver failure ?   Breast cancer Maternal Grandmother        untreated   Other Daughter        suicide     Allergies[2]    Patient's last menstrual period was No LMP recorded. Patient has had a hysterectomy..            Review of Systems Alls systems reviewed and are negative.     Physical Exam Constitutional:      Appearance: Normal appearance.  Genitourinary:     Vulva and urethral meatus normal.     No lesions in the vagina.     Genitourinary Comments: Cervical prolapse to hymen     Right Labia: No rash, lesions or skin changes.    Left Labia: No lesions, skin changes or rash.    No vaginal discharge or tenderness.     Apical vaginal prolapse present.    Mild vaginal atrophy present.     Right Adnexa: not tender and no mass present.    Left Adnexa: not tender and no mass present.    Cervix is not absent.     No cervical motion tenderness or discharge.     Uterus is absent.  Breasts:    Right: Normal.     Left: Normal.  HENT:     Head: Normocephalic.  Neck:     Thyroid : No thyroid  mass, thyromegaly or thyroid  tenderness.  Cardiovascular:     Rate and Rhythm: Normal rate and regular rhythm.     Heart sounds: Normal heart sounds, S1 normal and S2 normal.  Pulmonary:     Effort: Pulmonary effort is normal.     Breath sounds:  Normal breath sounds and air entry.  Abdominal:     General: There is no distension.     Palpations: Abdomen is soft. There is no mass.     Tenderness: There is no abdominal tenderness. There is no guarding or rebound.  Musculoskeletal:        General: Normal range of motion.     Cervical back: Full passive range of motion without pain, normal range of motion and neck supple. No tenderness.     Right lower leg: No edema.     Left lower leg: No edema.  Neurological:     Mental Status: She is alert.  Skin:    General: Skin is warm.  Psychiatric:        Mood and Affect: Mood normal.        Behavior: Behavior normal.        Thought Content: Thought content normal.  Vitals and nursing note reviewed. Exam conducted with a chaperone present.      Joy, CMA was present for the exam A:         [facility] Woman medicare GYN exam                             P:        Pap smear not indicated Encouraged annual mammogram screening Colon cancer screening up-to-date DXA  ordered today Labs and immunizations to do with PMD Encouraged healthy lifestyle practices Encouraged Vit D and Calcium    No follow-ups on file.  Regina Roberts     [1]  Current Outpatient Medications on File Prior to Visit  Medication Sig Dispense Refill   atorvastatin  (LIPITOR) 10 MG tablet TAKE 1 TABLET BY MOUTH DAILY 90 tablet 1   b complex vitamins tablet Take 1 tablet by mouth daily. 100 tablet 3   CALCIUM  PO Take 1 tablet by mouth daily.     Cholecalciferol  (VITAMIN D -3) 1000 UNITS CAPS Take 1,000 Units by mouth daily.     Cyanocobalamin  ([employer] VITAMIN B12 PO) Take 1 tablet by mouth daily.     furosemide  (LASIX ) 20 MG tablet TAKE 1 TO 2 TABLETS(20 TO 40 MG) BY MOUTH DAILY AS NEEDED FOR SWELLING 60 tablet 3   losartan  (COZAAR ) 100 MG tablet TAKE 1 TABLET BY MOUTH DAILY 90 tablet 1   montelukast  (SINGULAIR ) 10 MG tablet TAKE 1 TABLET BY MOUTH EVERY DAY 90 tablet 1   Multiple Vitamins-Minerals (CENTRUM ADULTS) TABS  Take 1 tablet by mouth daily.     naproxen sodium (ANAPROX) 220 MG tablet Take 220 mg by mouth as needed (headache or pain).     Omega-3 Fatty Acids (FISH OIL PO) Take 1 capsule by mouth daily.     pantoprazole  (PROTONIX ) 40 MG tablet TAKE 1 TABLET(40 MG) BY MOUTH DAILY 90 tablet 3   triamcinolone  ointment (KENALOG ) 0.5 % apply to affected area twice a day (Patient taking differently: Apply 1 application  topically once as needed (itching).) 60 g 3   triamterene -hydrochlorothiazide  (MAXZIDE-25) 37.5-25 MG tablet Take 1 tablet by mouth daily. 90 tablet 2   No current facility-administered medications on file prior to visit.  [2]  Allergies Allergen Reactions   Latex Itching   Lisinopril  Cough   Phentermine  Other (See Comments)    Raynauld's ? Tachycardia   Iodinated Contrast Media Itching    Ears itch when having contrast.  Will need pre-meds for CT scans with contrast per Radiologist

## 2024-10-10 ENCOUNTER — Encounter: Admitting: Internal Medicine

## 2024-11-12 ENCOUNTER — Other Ambulatory Visit: Payer: Self-pay | Admitting: Internal Medicine

## 2024-11-26 ENCOUNTER — Encounter: Admitting: Internal Medicine

## 2024-12-19 ENCOUNTER — Encounter: Admitting: Internal Medicine

## 2025-10-07 ENCOUNTER — Encounter: Admitting: Obstetrics and Gynecology

## 2025-10-13 ENCOUNTER — Ambulatory Visit

## 2025-10-13 ENCOUNTER — Encounter: Admitting: Internal Medicine
